# Patient Record
Sex: Female | Born: 1985 | Race: White | Hispanic: No | Marital: Married | Smoking: Current some day smoker
Health system: Southern US, Community
[De-identification: ages and names within clinical notes are randomized; demographics above are authoritative.]

## PROBLEM LIST (undated history)

## (undated) DIAGNOSIS — Z933 Colostomy status: Secondary | ICD-10-CM

## (undated) DIAGNOSIS — G43909 Migraine, unspecified, not intractable, without status migrainosus: Secondary | ICD-10-CM

## (undated) DIAGNOSIS — R221 Localized swelling, mass and lump, neck: Secondary | ICD-10-CM

## (undated) DIAGNOSIS — R011 Cardiac murmur, unspecified: Secondary | ICD-10-CM

## (undated) DIAGNOSIS — T7840XA Allergy, unspecified, initial encounter: Secondary | ICD-10-CM

## (undated) DIAGNOSIS — A6920 Lyme disease, unspecified: Secondary | ICD-10-CM

## (undated) DIAGNOSIS — F32A Depression, unspecified: Secondary | ICD-10-CM

## (undated) DIAGNOSIS — F419 Anxiety disorder, unspecified: Secondary | ICD-10-CM

## (undated) DIAGNOSIS — F329 Major depressive disorder, single episode, unspecified: Secondary | ICD-10-CM

## (undated) DIAGNOSIS — N179 Acute kidney failure, unspecified: Secondary | ICD-10-CM

## (undated) DIAGNOSIS — J189 Pneumonia, unspecified organism: Secondary | ICD-10-CM

## (undated) DIAGNOSIS — Z720 Tobacco use: Secondary | ICD-10-CM

## (undated) DIAGNOSIS — K567 Ileus, unspecified: Secondary | ICD-10-CM

## (undated) DIAGNOSIS — B019 Varicella without complication: Secondary | ICD-10-CM

## (undated) DIAGNOSIS — N83209 Unspecified ovarian cyst, unspecified side: Secondary | ICD-10-CM

## (undated) HISTORY — DX: Allergy, unspecified, initial encounter: T78.40XA

## (undated) HISTORY — DX: Colostomy status: Z93.3

## (undated) HISTORY — DX: Cardiac murmur, unspecified: R01.1

## (undated) HISTORY — DX: Depression, unspecified: F32.A

## (undated) HISTORY — DX: Lyme disease, unspecified: A69.20

## (undated) HISTORY — DX: Anxiety disorder, unspecified: F41.9

## (undated) HISTORY — DX: Migraine, unspecified, not intractable, without status migrainosus: G43.909

## (undated) HISTORY — DX: Varicella without complication: B01.9

## (undated) HISTORY — DX: Major depressive disorder, single episode, unspecified: F32.9

## (undated) HISTORY — PX: CYSTECTOMY: SUR359

---

## 2006-07-16 ENCOUNTER — Inpatient Hospital Stay: Payer: Self-pay

## 2006-07-25 ENCOUNTER — Ambulatory Visit: Payer: Self-pay | Admitting: Unknown Physician Specialty

## 2006-08-01 ENCOUNTER — Ambulatory Visit: Payer: Self-pay | Admitting: Unknown Physician Specialty

## 2006-08-26 HISTORY — PX: DILATION AND CURETTAGE OF UTERUS: SHX78

## 2007-12-04 ENCOUNTER — Other Ambulatory Visit: Payer: Self-pay

## 2007-12-04 ENCOUNTER — Inpatient Hospital Stay: Payer: Self-pay | Admitting: Psychiatry

## 2007-12-07 ENCOUNTER — Ambulatory Visit: Payer: Self-pay | Admitting: Cardiology

## 2009-04-29 ENCOUNTER — Emergency Department: Payer: Self-pay | Admitting: Unknown Physician Specialty

## 2014-06-15 ENCOUNTER — Ambulatory Visit: Payer: Self-pay

## 2016-03-06 ENCOUNTER — Encounter: Payer: Self-pay | Admitting: Family Medicine

## 2016-03-06 ENCOUNTER — Ambulatory Visit (INDEPENDENT_AMBULATORY_CARE_PROVIDER_SITE_OTHER): Payer: Self-pay

## 2016-03-06 ENCOUNTER — Ambulatory Visit (INDEPENDENT_AMBULATORY_CARE_PROVIDER_SITE_OTHER): Payer: Self-pay | Admitting: Family Medicine

## 2016-03-06 DIAGNOSIS — F32A Depression, unspecified: Secondary | ICD-10-CM

## 2016-03-06 DIAGNOSIS — M25561 Pain in right knee: Secondary | ICD-10-CM

## 2016-03-06 DIAGNOSIS — F329 Major depressive disorder, single episode, unspecified: Secondary | ICD-10-CM

## 2016-03-06 DIAGNOSIS — F17210 Nicotine dependence, cigarettes, uncomplicated: Secondary | ICD-10-CM

## 2016-03-06 MED ORDER — BUPROPION HCL ER (XL) 150 MG PO TB24: 150.0000 mg | ORAL_TABLET | Freq: Every day | ORAL | Status: DC

## 2016-03-06 MED ORDER — DICLOFENAC SODIUM 75 MG PO TBEC: 75.0000 mg | DELAYED_RELEASE_TABLET | Freq: Two times a day (BID) | ORAL | Status: DC | PRN

## 2016-03-06 NOTE — Patient Instructions (Signed)
Take the medications as prescribed.   Follow up in 6 weeks.  Take care  Dr. Lacinda Axon   Health Maintenance, Female Adopting a healthy lifestyle and getting preventive care can go a long way to promote health and wellness. Talk with your health care provider about what schedule of regular examinations is right for you. This is a good chance for you to check in with your provider about disease prevention and staying healthy. In between checkups, there are plenty of things you can do on your own. Experts have done a lot of research about which lifestyle changes and preventive measures are most likely to keep you healthy. Ask your health care provider for more information. WEIGHT AND DIET  Eat a healthy diet  Be sure to include plenty of vegetables, fruits, low-fat dairy products, and lean protein.  Do not eat a lot of foods high in solid fats, added sugars, or salt.  Get regular exercise. This is one of the most important things you can do for your health.  Most adults should exercise for at least 150 minutes each week. The exercise should increase your heart rate and make you sweat (moderate-intensity exercise).  Most adults should also do strengthening exercises at least twice a week. This is in addition to the moderate-intensity exercise.  Maintain a healthy weight  Body mass index (BMI) is a measurement that can be used to identify possible weight problems. It estimates body fat based on height and weight. Your health care provider can help determine your BMI and help you achieve or maintain a healthy weight.  For females 86 years of age and older:   A BMI below 18.5 is considered underweight.  A BMI of 18.5 to 24.9 is normal.  A BMI of 25 to 29.9 is considered overweight.  A BMI of 30 and above is considered obese.  Watch levels of cholesterol and blood lipids  You should start having your blood tested for lipids and cholesterol at 30 years of age, then have this test every 5  years.  You may need to have your cholesterol levels checked more often if:  Your lipid or cholesterol levels are high.  You are older than 30 years of age.  You are at high risk for heart disease.  CANCER SCREENING   Lung Cancer  Lung cancer screening is recommended for adults 50-56 years old who are at high risk for lung cancer because of a history of smoking.  A yearly low-dose CT scan of the lungs is recommended for people who:  Currently smoke.  Have quit within the past 15 years.  Have at least a 30-pack-year history of smoking. A pack year is smoking an average of one pack of cigarettes a day for 1 year.  Yearly screening should continue until it has been 15 years since you quit.  Yearly screening should stop if you develop a health problem that would prevent you from having lung cancer treatment.  Breast Cancer  Practice breast self-awareness. This means understanding how your breasts normally appear and feel.  It also means doing regular breast self-exams. Let your health care provider know about any changes, no matter how small.  If you are in your 20s or 30s, you should have a clinical breast exam (CBE) by a health care provider every 1-3 years as part of a regular health exam.  If you are 25 or older, have a CBE every year. Also consider having a breast X-ray (mammogram) every year.  If  you have a family history of breast cancer, talk to your health care provider about genetic screening.  If you are at high risk for breast cancer, talk to your health care provider about having an MRI and a mammogram every year.  Breast cancer gene (BRCA) assessment is recommended for women who have family members with BRCA-related cancers. BRCA-related cancers include:  Breast.  Ovarian.  Tubal.  Peritoneal cancers.  Results of the assessment will determine the need for genetic counseling and BRCA1 and BRCA2 testing. Cervical Cancer Your health care provider may  recommend that you be screened regularly for cancer of the pelvic organs (ovaries, uterus, and vagina). This screening involves a pelvic examination, including checking for microscopic changes to the surface of your cervix (Pap test). You may be encouraged to have this screening done every 3 years, beginning at age 47.  For women ages 80-65, health care providers may recommend pelvic exams and Pap testing every 3 years, or they may recommend the Pap and pelvic exam, combined with testing for human papilloma virus (HPV), every 5 years. Some types of HPV increase your risk of cervical cancer. Testing for HPV may also be done on women of any age with unclear Pap test results.  Other health care providers may not recommend any screening for nonpregnant women who are considered low risk for pelvic cancer and who do not have symptoms. Ask your health care provider if a screening pelvic exam is right for you.  If you have had past treatment for cervical cancer or a condition that could lead to cancer, you need Pap tests and screening for cancer for at least 20 years after your treatment. If Pap tests have been discontinued, your risk factors (such as having a new sexual partner) need to be reassessed to determine if screening should resume. Some women have medical problems that increase the chance of getting cervical cancer. In these cases, your health care provider may recommend more frequent screening and Pap tests. Colorectal Cancer  This type of cancer can be detected and often prevented.  Routine colorectal cancer screening usually begins at 30 years of age and continues through 30 years of age.  Your health care provider may recommend screening at an earlier age if you have risk factors for colon cancer.  Your health care provider may also recommend using home test kits to check for hidden blood in the stool.  A small camera at the end of a tube can be used to examine your colon directly  (sigmoidoscopy or colonoscopy). This is done to check for the earliest forms of colorectal cancer.  Routine screening usually begins at age 57.  Direct examination of the colon should be repeated every 5-10 years through 30 years of age. However, you may need to be screened more often if early forms of precancerous polyps or small growths are found. Skin Cancer  Check your skin from head to toe regularly.  Tell your health care provider about any new moles or changes in moles, especially if there is a change in a mole's shape or color.  Also tell your health care provider if you have a mole that is larger than the size of a pencil eraser.  Always use sunscreen. Apply sunscreen liberally and repeatedly throughout the day.  Protect yourself by wearing long sleeves, pants, a wide-brimmed hat, and sunglasses whenever you are outside. HEART DISEASE, DIABETES, AND HIGH BLOOD PRESSURE   High blood pressure causes heart disease and increases the risk of  stroke. High blood pressure is more likely to develop in:  People who have blood pressure in the high end of the normal range (130-139/85-89 mm Hg).  People who are overweight or obese.  People who are African American.  If you are 9-32 years of age, have your blood pressure checked every 3-5 years. If you are 6 years of age or older, have your blood pressure checked every year. You should have your blood pressure measured twice--once when you are at a hospital or clinic, and once when you are not at a hospital or clinic. Record the average of the two measurements. To check your blood pressure when you are not at a hospital or clinic, you can use:  An automated blood pressure machine at a pharmacy.  A home blood pressure monitor.  If you are between 44 years and 43 years old, ask your health care provider if you should take aspirin to prevent strokes.  Have regular diabetes screenings. This involves taking a blood sample to check your  fasting blood sugar level.  If you are at a normal weight and have a low risk for diabetes, have this test once every three years after 30 years of age.  If you are overweight and have a high risk for diabetes, consider being tested at a younger age or more often. PREVENTING INFECTION  Hepatitis B  If you have a higher risk for hepatitis B, you should be screened for this virus. You are considered at high risk for hepatitis B if:  You were born in a country where hepatitis B is common. Ask your health care provider which countries are considered high risk.  Your parents were born in a high-risk country, and you have not been immunized against hepatitis B (hepatitis B vaccine).  You have HIV or AIDS.  You use needles to inject street drugs.  You live with someone who has hepatitis B.  You have had sex with someone who has hepatitis B.  You get hemodialysis treatment.  You take certain medicines for conditions, including cancer, organ transplantation, and autoimmune conditions. Hepatitis C  Blood testing is recommended for:  Everyone born from [birth year] through 1965.  Anyone with known risk factors for hepatitis C. Sexually transmitted infections (STIs)  You should be screened for sexually transmitted infections (STIs) including gonorrhea and chlamydia if:  You are sexually active and are younger than 30 years of age.  You are older than 30 years of age and your health care provider tells you that you are at risk for this type of infection.  Your sexual activity has changed since you were last screened and you are at an increased risk for chlamydia or gonorrhea. Ask your health care provider if you are at risk.  If you do not have HIV, but are at risk, it may be recommended that you take a prescription medicine daily to prevent HIV infection. This is called pre-exposure prophylaxis (PrEP). You are considered at risk if:  You are sexually active and do not regularly use condoms or  know the HIV status of your partner(s).  You take drugs by injection.  You are sexually active with a partner who has HIV. Talk with your health care provider about whether you are at high risk of being infected with HIV. If you choose to begin PrEP, you should first be tested for HIV. You should then be tested every 3 months for as long as you are taking PrEP.  PREGNANCY   If  you are premenopausal and you may become pregnant, ask your health care provider about preconception counseling.  If you may become pregnant, take 400 to 800 micrograms (mcg) of folic acid every day.  If you want to prevent pregnancy, talk to your health care provider about birth control (contraception). OSTEOPOROSIS AND MENOPAUSE   Osteoporosis is a disease in which the bones lose minerals and strength with aging. This can result in serious bone fractures. Your risk for osteoporosis can be identified using a bone density scan.  If you are 83 years of age or older, or if you are at risk for osteoporosis and fractures, ask your health care provider if you should be screened.  Ask your health care provider whether you should take a calcium or vitamin D supplement to lower your risk for osteoporosis.  Menopause may have certain physical symptoms and risks.  Hormone replacement therapy may reduce some of these symptoms and risks. Talk to your health care provider about whether hormone replacement therapy is right for you.  HOME CARE INSTRUCTIONS   Schedule regular health, dental, and eye exams.  Stay current with your immunizations.   Do not use any tobacco products including cigarettes, chewing tobacco, or electronic cigarettes.  If you are pregnant, do not drink alcohol.  If you are breastfeeding, limit how much and how often you drink alcohol.  Limit alcohol intake to no more than 1 drink per day for nonpregnant women. One drink equals 12 ounces of beer, 5 ounces of wine, or 1 ounces of hard liquor.  Do  not use street drugs.  Do not share needles.  Ask your health care provider for help if you need support or information about quitting drugs.  Tell your health care provider if you often feel depressed.  Tell your health care provider if you have ever been abused or do not feel safe at home.   This information is not intended to replace advice given to you by your health care provider. Make sure you discuss any questions you have with your health care provider.   Document Released: 02/25/2011 Document Revised: 09/02/2014 Document Reviewed: 07/14/2013 Elsevier Interactive Patient Education [employer].

## 2016-03-06 NOTE — Progress Notes (Signed)
Pre visit review using our clinic review tool, if applicable. No additional management support is needed unless otherwise documented below in the visit note. 

## 2016-03-07 ENCOUNTER — Encounter: Payer: Self-pay | Admitting: Family Medicine

## 2016-03-07 DIAGNOSIS — F32A Depression, unspecified: Secondary | ICD-10-CM | POA: Insufficient documentation

## 2016-03-07 DIAGNOSIS — F17200 Nicotine dependence, unspecified, uncomplicated: Secondary | ICD-10-CM | POA: Insufficient documentation

## 2016-03-07 DIAGNOSIS — M25561 Pain in right knee: Secondary | ICD-10-CM | POA: Insufficient documentation

## 2016-03-07 DIAGNOSIS — F329 Major depressive disorder, single episode, unspecified: Secondary | ICD-10-CM | POA: Insufficient documentation

## 2016-03-07 NOTE — Assessment & Plan Note (Signed)
New problem. Exam unremarkable. Suspect MSK etiology. Treating with Voltaren. Obtaining x-ray.

## 2016-03-07 NOTE — Progress Notes (Signed)
Subjective:  Patient ID: Lacey Harrison, female    DOB: 1986/02/28  Age: 30 y.o. MRN: [id number]  CC: Smoking cessation, Depression/Anxiety, Knee pain  HPI Lacey Harrison is a 30 y.o. female presents to the clinic today as a new patient with the above complaints.  Smoking cessation  Patient currently smokes anywhere from a half a pack to pack a day.  She is very interested in quitting.  She has quit in the past briefly but then relapsed.  She is very motivated and would like to discuss treatment options today.  Depression/Anxiety  Patient reports a history of depression and anxiety.  She states that recently she has been experiencing signs and symptoms of depression and possibly anxiety.  She states that is been experiencing insomnia as well as periods of time where she sleeps too much.  She reports low energy.  She reports decreased interest in doing things/motivation.  She reports that she is unhappy.  Feels down and his lack of desire.  She states that she has had this issue in the past.  No recent new stressors per her report.  No known exacerbating factors.  Right knee pain  Patient reports that she has recently been expressing her right knee pain.  Pain located at the patella.  Moderate to severe at times.  Appears to be aggravated by certain motions and prolonged sitting or standing.  No recent fall, trauma, injury.  No known relieving factors. She's taken some over-the-counter medicine for this.  PMH, Surgical Hx, Family Hx, Social History reviewed and updated as below.  Past Medical History  Diagnosis Date  . Chicken pox   . Heart murmur   . Migraines   . Depression   . Lyme disease     patient reports a remote history of lyme and alludes that she has "chronic lyme"   Past Surgical History  Procedure Laterality Date  . Cystectomy    . Dilation and curettage of uterus  2008   Family History  Problem Relation Age of Onset  . Mental  illness Mother   . Mental illness Father   . Mental illness Maternal Grandmother   . Mental illness Paternal Grandmother    Social History  Substance Use Topics  . Smoking status: Current Every Day Smoker -- 0.50 packs/day    Types: Cigarettes  . Smokeless tobacco: Never Used  . Alcohol Use: 1.8 oz/week    3 Standard drinks or equivalent per week   Review of Systems  HENT: Positive for tinnitus.   Eyes: Positive for visual disturbance.  Musculoskeletal:       Knee pain, right,.  Psychiatric/Behavioral:       Anxiety, stress.  All other systems reviewed and are negative.  Objective:   Today's Vitals: BP 116/70 mmHg  Pulse 75  Temp(Src) 97.8 F (36.6 C) (Oral)  Ht 5' 2.9" (1.598 m)  Wt 191 lb 6 oz (86.807 kg)  BMI 33.99 kg/m2  SpO2 98%  Physical Exam  Constitutional: She is oriented to person, place, and time. She appears well-developed. No distress.  HENT:  Head: Normocephalic and atraumatic.  Mouth/Throat: Oropharynx is clear and moist.  Normal TM's bilaterally.   Eyes: Conjunctivae are normal. No scleral icterus.  Neck: Neck supple.  Cardiovascular: Normal rate and regular rhythm.   No murmur heard. Pulmonary/Chest: Effort normal. She has no wheezes. She has no rales.  Abdominal: Soft. She exhibits no distension. There is no tenderness.  Musculoskeletal:  Knee: Right Normal to  inspection with no erythema or effusion or obvious bony abnormalities.  Palpation normal with no warmth, joint line tenderness, patellar tenderness, or condyle tenderness. ROM full in flexion and extension and lower leg rotation. Ligaments with solid consistent endpoints including ACL, PCL, LCL, MCL. Negative Mcmurray's. Non painful patellar compression. Patellar glide without crepitus. Patellar and quadriceps tendons unremarkable.    Neurological: She is alert and oriented to person, place, and time.  Skin: Skin is warm and dry. No rash noted.  Psychiatric: She has a normal mood and  affect.  Vitals reviewed.  Assessment & Plan:   Problem List Items Addressed This Visit    Nicotine dependence    Tobacco cessation counseling  Ask: Currently smokes 1/2 ppd to 1 ppd.   Advise: Advised to quit and she is interested.  Assess: Willing to quit and requesting help.  Assist: Discussed treatment options - nicotine replacement, Chantix, Wellbutrin. Elected for Wellbutrin after discussion.  Arrange follow up: 6 weeks.  Time spent: 3 mins.        Depression    New problem. Patient exhibiting signs and symptoms of depression. Starting Wellbutrin today as this will hopefully help with her depression as well as smoking cessation.      Relevant Medications   buPROPion (WELLBUTRIN XL) 150 MG 24 hr tablet   Right knee pain    New problem. Exam unremarkable. Suspect MSK etiology. Treating with Voltaren. Obtaining x-ray.      Relevant Orders   DG Knee Complete 4 Views Right      Outpatient Encounter Prescriptions as of 03/06/2016  Medication Sig  . buPROPion (WELLBUTRIN XL) 150 MG 24 hr tablet Take 1 tablet (150 mg total) by mouth daily. Increase to 300 mg after 1 week.  . diclofenac (VOLTAREN) 75 MG EC tablet Take 1 tablet (75 mg total) by mouth 2 (two) times daily as needed.   No facility-administered encounter medications on file as of 03/06/2016.    Follow-up: 6 weeks  [place]

## 2016-03-07 NOTE — Assessment & Plan Note (Signed)
Tobacco cessation counseling  Ask: Currently smokes 1/2 ppd to 1 ppd.   Advise: Advised to quit and she is interested.  Assess: Willing to quit and requesting help.  Assist: Discussed treatment options - nicotine replacement, Chantix, Wellbutrin. Elected for Wellbutrin after discussion.  Arrange follow up: 6 weeks.  Time spent: 3 mins.

## 2016-03-07 NOTE — Assessment & Plan Note (Signed)
New problem. Patient exhibiting signs and symptoms of depression. Starting Wellbutrin today as this will hopefully help with her depression as well as smoking cessation.

## 2016-03-09 ENCOUNTER — Encounter: Payer: Self-pay | Admitting: Emergency Medicine

## 2016-03-09 ENCOUNTER — Emergency Department
Admission: EM | Admit: 2016-03-09 | Discharge: 2016-03-09 | Disposition: A | Discharge status: Home / Self Care | Payer: BLUE CROSS/BLUE SHIELD | Attending: Emergency Medicine | Admitting: Emergency Medicine

## 2016-03-09 DIAGNOSIS — F17210 Nicotine dependence, cigarettes, uncomplicated: Secondary | ICD-10-CM | POA: Insufficient documentation

## 2016-03-09 DIAGNOSIS — F329 Major depressive disorder, single episode, unspecified: Secondary | ICD-10-CM | POA: Diagnosis not present

## 2016-03-09 DIAGNOSIS — Y939 Activity, unspecified: Secondary | ICD-10-CM | POA: Insufficient documentation

## 2016-03-09 DIAGNOSIS — Y929 Unspecified place or not applicable: Secondary | ICD-10-CM | POA: Diagnosis not present

## 2016-03-09 DIAGNOSIS — Y999 Unspecified external cause status: Secondary | ICD-10-CM | POA: Insufficient documentation

## 2016-03-09 DIAGNOSIS — L089 Local infection of the skin and subcutaneous tissue, unspecified: Secondary | ICD-10-CM

## 2016-03-09 DIAGNOSIS — W5501XA Bitten by cat, initial encounter: Secondary | ICD-10-CM | POA: Diagnosis not present

## 2016-03-09 DIAGNOSIS — S61452A Open bite of left hand, initial encounter: Secondary | ICD-10-CM

## 2016-03-09 DIAGNOSIS — S61257A Open bite of left little finger without damage to nail, initial encounter: Secondary | ICD-10-CM | POA: Diagnosis not present

## 2016-03-09 MED ORDER — RABIES VACCINE, PCEC IM SUSR
1.0000 mL | Freq: Once | INTRAMUSCULAR | Status: AC
  Administered 2016-03-09: 1 mL via INTRAMUSCULAR
  Filled 2016-03-09: qty 1

## 2016-03-09 MED ORDER — TRAMADOL HCL 50 MG PO TABS: 50.0000 mg | ORAL_TABLET | Freq: Four times a day (QID) | ORAL | Status: DC | PRN

## 2016-03-09 MED ORDER — RABIES IMMUNE GLOBULIN 150 UNIT/ML IM INJ
20.0000 [IU]/kg | INJECTION | Freq: Once | INTRAMUSCULAR | Status: AC
  Administered 2016-03-09: 1650 [IU] via INTRAMUSCULAR
  Filled 2016-03-09: qty 11

## 2016-03-09 MED ORDER — AMOXICILLIN-POT CLAVULANATE 875-125 MG PO TABS: 1.0000 | ORAL_TABLET | Freq: Two times a day (BID) | ORAL | Status: DC

## 2016-03-09 NOTE — ED Provider Notes (Signed)
[facility] Emergency Department Provider Note   ____________________________________________  Time seen: Approximately 2:49 PM  I have reviewed the triage vital signs and the nursing notes.   HISTORY  Chief Complaint Animal Bite    HPI Lacey Harrison is a 30 y.o. female patient bitten by a stray cat on the fifth digit left hand last night. Patient also had multiple scratches from the animal. Patient state her tetanus shot is up-to-date. Patient denies loss sensation or loss of function of the affected digit from the incident. No palliative measures for this complaint.   Past Medical History  Diagnosis Date  . Chicken pox   . Heart murmur   . Migraines   . Depression   . Lyme disease     patient reports a remote history of lyme and alludes that she has "chronic lyme"    Patient Active Problem List   Diagnosis Date Noted  . Nicotine dependence 03/07/2016  . Depression 03/07/2016  . Right knee pain 03/07/2016    Past Surgical History  Procedure Laterality Date  . Cystectomy    . Dilation and curettage of uterus  2008    Current Outpatient Rx  Name  Route  Sig  Dispense  Refill  . amoxicillin-clavulanate (AUGMENTIN) 875-125 MG tablet   Oral   Take 1 tablet by mouth 2 (two) times daily.   20 tablet   0   . buPROPion (WELLBUTRIN XL) 150 MG 24 hr tablet   Oral   Take 1 tablet (150 mg total) by mouth daily. Increase to 300 mg after 1 week.   180 tablet   0   . diclofenac (VOLTAREN) 75 MG EC tablet   Oral   Take 1 tablet (75 mg total) by mouth 2 (two) times daily as needed.   60 tablet   0     Allergies Review of patient's allergies indicates no known allergies.  Family History  Problem Relation Age of Onset  . Mental illness Mother   . Mental illness Father   . Mental illness Maternal Grandmother   . Mental illness Paternal Grandmother     Social History Social History  Substance Use Topics  . Smoking status:  Current Every Day Smoker -- 0.50 packs/day    Types: Cigarettes  . Smokeless tobacco: Never Used  . Alcohol Use: 1.8 oz/week    3 Standard drinks or equivalent per week    Review of Systems Constitutional: No fever/chills Eyes: No visual changes. ENT: No sore throat. Cardiovascular: Denies chest pain. Respiratory: Denies shortness of breath. Gastrointestinal: No abdominal pain.  No nausea, no vomiting.  No diarrhea.  No constipation. Genitourinary: Negative for dysuria. Musculoskeletal: Negative for back pain. Skin: Negative for rash.Redness swelling to the fifth digit left hand. Neurological: Negative for headaches, focal weakness or numbness. Psychiatric: Depression ____________________________________________   PHYSICAL EXAM:  VITAL SIGNS: ED Triage Vitals  Enc Vitals Group     BP 03/09/16 1427 116/77 mmHg     Pulse Rate 03/09/16 1427 99     Resp 03/09/16 1427 18     Temp 03/09/16 1427 98.3 F (36.8 C)     Temp Source 03/09/16 1427 Oral     SpO2 03/09/16 1427 97 %     Weight 03/09/16 1427 180 lb (81.647 kg)     Height 03/09/16 1427 5\' 3"  (1.6 m)     Head Cir --      Peak Flow --      Pain Score  03/09/16 1429 1     Pain Loc --      Pain Edu? --      Excl. in [place]? --     Constitutional: Alert and oriented. Well appearing and in no acute distress. Eyes: Conjunctivae are normal. PERRL. EOMI. Head: Atraumatic. Nose: No congestion/rhinnorhea. Mouth/Throat: Mucous membranes are moist.  Oropharynx non-erythematous. Neck: No stridor.  No cervical spine tenderness to palpation. Hematological/Lymphatic/Immunilogical: No cervical lymphadenopathy. Cardiovascular: Normal rate, regular rhythm. Grossly normal heart sounds.  Good peripheral circulation. Respiratory: Normal respiratory effort.  No retractions. Lungs CTAB. Gastrointestinal: Soft and nontender. No distention. No abdominal bruits. No CVA tenderness. Musculoskeletal: No lower extremity tenderness nor edema.  No  joint effusions. Neurologic:  Normal speech and language. No gross focal neurologic deficits are appreciated. No gait instability. Skin:  Edema and erythema and puncture wound to the fifth digit left hand. Patient also multiple scratches to the palmar and dorsal aspect of the bilateral hands.  Psychiatric: Mood and affect are normal. Speech and behavior are normal.  ____________________________________________   LABS (all labs ordered are listed, but only abnormal results are displayed)  Labs Reviewed - No data to display ____________________________________________  EKG   ____________________________________________  RADIOLOGY   ____________________________________________   PROCEDURES  Procedure(s) performed: None  Procedures  Critical Care performed: No  ____________________________________________   INITIAL IMPRESSION / ASSESSMENT AND PLAN / ED COURSE  Pertinent labs & imaging results that were available during my care of the patient were reviewed by me and considered in my medical decision making (see chart for details).  Bite and scratches to the upper extremity. Cellulitis to the fifth digit left hand. Patient given discharge care instructions. Patient start rabies series in the ED today. Patient advised to follow up per scheduled for continual rabies shots to complete a series. Patient given a prescription for Augmentin. Patient declined pain medication at this time. ____________________________________________   FINAL CLINICAL IMPRESSION(S) / ED DIAGNOSES  Final diagnoses:  Cat bite of left hand including fingers with infection, initial encounter      NEW MEDICATIONS STARTED DURING THIS VISIT:  New Prescriptions   AMOXICILLIN-CLAVULANATE (AUGMENTIN) 875-125 MG TABLET    Take 1 tablet by mouth 2 (two) times daily.     Note:  This document was prepared using Dragon voice recognition software and may include unintentional dictation  errors.    Sable Feil, PA-C 03/09/16 1500  Harvest Dark, MD 03/09/16 [phone]

## 2016-03-09 NOTE — ED Notes (Signed)
Stray cat bite approx 9pm last evening to L hand.

## 2016-03-09 NOTE — Discharge Instructions (Signed)
Animal Bite °Animal bite wounds can get infected. It is important to get proper medical treatment. Ask your doctor if you need rabies treatment. °HOME CARE  °Wound Care °· Follow instructions from your doctor about how to take care of your wound. Make sure you: °¨ Wash your hands with soap and water before you change your bandage (dressing). If you cannot use soap and water, use hand sanitizer. °¨ Change your bandage as told by your doctor. °¨ Leave stitches (sutures), skin glue, or skin tape (adhesive) strips in place. They may need to stay in place for 2 weeks or longer. If tape strips get loose and curl up, you may trim the loose edges. Do not remove tape strips completely unless your doctor says it is okay. °· Check your wound every day for signs of infection. Watch for:   °¨   Redness, swelling, or pain that gets worse.   °¨   Fluid, blood, or pus.   °General Instructions °· Take or apply over-the-counter and prescription medicines only as told by your doctor.   °· If you were prescribed an antibiotic, take or apply it as told by your doctor. Do not stop using the antibiotic even if your condition improves.   °· Keep the injured area raised (elevated) above the level of your heart while you are sitting or lying down. °· If directed, apply ice to the injured area.   °¨   Put ice in a plastic bag.   °¨   Place a towel between your skin and the bag.   °¨   Leave the ice on for 20 minutes, 2-3 times per day.   °· Keep all follow-up visits as told by your doctor. This is important.   °GET HELP IF: °· You have redness, swelling, or pain that gets worse.   °· You have a general feeling of sickness (malaise).   °· You feel sick to your stomach (nauseous). °· You throw up (vomit).   °· You have pain that does not get better.   °GET HELP RIGHT AWAY IF:  °· You have a red streak going away from your wound.   °· You have fluid, blood, or pus coming from your wound.   °· You have a fever or chills.   °· You have trouble  moving your injured area.   °· You have numbness or tingling anywhere on your body.   °  °This information is not intended to replace advice given to you by your health care provider. Make sure you discuss any questions you have with your health care provider. °  °Document Released: 08/12/2005 Document Revised: 05/03/2015 Document Reviewed: 12/28/2014 °Elsevier Interactive Patient Education ©2016 [employer] ° °

## 2016-03-13 ENCOUNTER — Ambulatory Visit
Admission: EM | Admit: 2016-03-13 | Discharge: 2016-03-13 | Disposition: A | Discharge status: Home / Self Care | Payer: BLUE CROSS/BLUE SHIELD | Attending: Family Medicine | Admitting: Family Medicine

## 2016-03-13 ENCOUNTER — Encounter: Payer: Self-pay | Admitting: Emergency Medicine

## 2016-03-13 DIAGNOSIS — Z203 Contact with and (suspected) exposure to rabies: Secondary | ICD-10-CM | POA: Diagnosis not present

## 2016-03-13 MED ORDER — RABIES VACCINE, PCEC IM SUSR
1.0000 mL | Freq: Once | INTRAMUSCULAR | Status: AC
  Administered 2016-03-13: 1 mL via INTRAMUSCULAR

## 2016-03-13 NOTE — ED Notes (Signed)
Pt here for day 3 rabies vaccine, called yesterday but says no one called her back to come on 18th so here today. Dr. Zenda Alpers aware and instructed to give shot. Pt denies any adverse reaction to previous shots for rabies.

## 2016-03-16 ENCOUNTER — Encounter: Payer: Self-pay | Admitting: Emergency Medicine

## 2016-03-16 ENCOUNTER — Ambulatory Visit
Admission: EM | Admit: 2016-03-16 | Discharge: 2016-03-16 | Disposition: A | Discharge status: Home / Self Care | Payer: BLUE CROSS/BLUE SHIELD | Attending: Emergency Medicine | Admitting: Emergency Medicine

## 2016-03-16 DIAGNOSIS — Z203 Contact with and (suspected) exposure to rabies: Secondary | ICD-10-CM

## 2016-03-16 MED ORDER — RABIES VACCINE, PCEC IM SUSR
1.0000 mL | Freq: Once | INTRAMUSCULAR | Status: AC
  Administered 2016-03-16: 1 mL via INTRAMUSCULAR

## 2016-03-16 NOTE — ED Notes (Signed)
Patient here for Day 7 rabies vaccine.

## 2016-03-16 NOTE — ED Notes (Signed)
Patient shows no signs of adverse reaction to vaccine at this time.  Patient instructed to follow-up here next Saturday for Day 14 of her rabies vaccine.  Patient verbalized understanding.

## 2016-07-26 ENCOUNTER — Emergency Department: Payer: BLUE CROSS/BLUE SHIELD

## 2016-07-26 ENCOUNTER — Encounter: Payer: Self-pay | Admitting: Emergency Medicine

## 2016-07-26 ENCOUNTER — Other Ambulatory Visit: Payer: Self-pay

## 2016-07-26 ENCOUNTER — Emergency Department
Admission: EM | Admit: 2016-07-26 | Discharge: 2016-07-26 | Disposition: A | Discharge status: Home / Self Care | Payer: BLUE CROSS/BLUE SHIELD | Attending: Emergency Medicine | Admitting: Emergency Medicine

## 2016-07-26 ENCOUNTER — Telehealth: Payer: Self-pay | Admitting: Family Medicine

## 2016-07-26 DIAGNOSIS — Z79899 Other long term (current) drug therapy: Secondary | ICD-10-CM | POA: Diagnosis not present

## 2016-07-26 DIAGNOSIS — J189 Pneumonia, unspecified organism: Secondary | ICD-10-CM

## 2016-07-26 DIAGNOSIS — J181 Lobar pneumonia, unspecified organism: Secondary | ICD-10-CM | POA: Insufficient documentation

## 2016-07-26 DIAGNOSIS — R509 Fever, unspecified: Secondary | ICD-10-CM | POA: Diagnosis present

## 2016-07-26 DIAGNOSIS — Z87891 Personal history of nicotine dependence: Secondary | ICD-10-CM | POA: Insufficient documentation

## 2016-07-26 LAB — CBC
HCT: 43.6 % (ref 35.0–47.0)
Hemoglobin: 15.2 g/dL (ref 12.0–16.0)
MCH: 30.1 pg (ref 26.0–34.0)
MCHC: 35 g/dL (ref 32.0–36.0)
MCV: 86 fL (ref 80.0–100.0)
Platelets: 200 K/uL (ref 150–440)
RBC: 5.07 MIL/uL (ref 3.80–5.20)
RDW: 12.8 % (ref 11.5–14.5)
WBC: 9.9 K/uL (ref 3.6–11.0)

## 2016-07-26 LAB — URINALYSIS COMPLETE WITH MICROSCOPIC (ARMC ONLY)
Bacteria, UA: NONE SEEN
Glucose, UA: NEGATIVE mg/dL
Hgb urine dipstick: NEGATIVE
Leukocytes, UA: NEGATIVE
Nitrite: NEGATIVE
Protein, ur: 100 mg/dL — AB
Specific Gravity, Urine: 1.04 — ABNORMAL HIGH (ref 1.005–1.030)
pH: 5 (ref 5.0–8.0)

## 2016-07-26 LAB — HEPATIC FUNCTION PANEL
ALT: 45 U/L (ref 14–54)
AST: 36 U/L (ref 15–41)
Albumin: 3.9 g/dL (ref 3.5–5.0)
Alkaline Phosphatase: 50 U/L (ref 38–126)
Bilirubin, Direct: 0.4 mg/dL (ref 0.1–0.5)
Indirect Bilirubin: 1.1 mg/dL — ABNORMAL HIGH (ref 0.3–0.9)
Total Bilirubin: 1.5 mg/dL — ABNORMAL HIGH (ref 0.3–1.2)
Total Protein: 7.8 g/dL (ref 6.5–8.1)

## 2016-07-26 LAB — BASIC METABOLIC PANEL WITH GFR
Anion gap: 10 (ref 5–15)
BUN: 10 mg/dL (ref 6–20)
CO2: 22 mmol/L (ref 22–32)
Calcium: 8.9 mg/dL (ref 8.9–10.3)
Chloride: 105 mmol/L (ref 101–111)
Creatinine, Ser: 0.73 mg/dL (ref 0.44–1.00)
GFR calc Af Amer: >60 mL/min
GFR calc non Af Amer: >60 mL/min
Glucose, Bld: 157 mg/dL — ABNORMAL HIGH (ref 65–99)
Potassium: 3.5 mmol/L (ref 3.5–5.1)
Sodium: 137 mmol/L (ref 135–145)

## 2016-07-26 LAB — POCT PREGNANCY, URINE: PREG TEST UR: NEGATIVE

## 2016-07-26 LAB — LIPASE, BLOOD: Lipase: 18 U/L (ref 11–51)

## 2016-07-26 MED ORDER — LEVOFLOXACIN IN D5W 750 MG/150ML IV SOLN
750.0000 mg | Freq: Once | INTRAVENOUS | Status: AC
  Administered 2016-07-26: 750 mg via INTRAVENOUS

## 2016-07-26 MED ORDER — LEVOFLOXACIN IN D5W 750 MG/150ML IV SOLN
INTRAVENOUS | Status: AC
  Administered 2016-07-26: 750 mg via INTRAVENOUS
  Filled 2016-07-26: qty 150

## 2016-07-26 MED ORDER — ALBUTEROL SULFATE HFA 108 (90 BASE) MCG/ACT IN AERS: 2.0000 | INHALATION_SPRAY | Freq: Four times a day (QID) | RESPIRATORY_TRACT | 0 refills | Status: DC | PRN

## 2016-07-26 MED ORDER — SODIUM CHLORIDE 0.9 % IV BOLUS (SEPSIS)
1000.0000 mL | Freq: Once | INTRAVENOUS | Status: AC
  Administered 2016-07-26: 1000 mL via INTRAVENOUS

## 2016-07-26 MED ORDER — IPRATROPIUM-ALBUTEROL 0.5-2.5 (3) MG/3ML IN SOLN
3.0000 mL | Freq: Once | RESPIRATORY_TRACT | Status: AC
  Administered 2016-07-26: 3 mL via RESPIRATORY_TRACT
  Filled 2016-07-26: qty 3

## 2016-07-26 MED ORDER — LEVOFLOXACIN 750 MG PO TABS: 750.0000 mg | ORAL_TABLET | Freq: Every day | ORAL | 0 refills | Status: DC

## 2016-07-26 NOTE — ED Notes (Signed)
Discharge instructions reviewed with patient. Questions fielded by this RN. Patient verbalizes understanding of instructions. Patient discharged home in stable condition per Goodman MD . No acute distress noted at time of discharge.   

## 2016-07-26 NOTE — ED Notes (Signed)
Patient states that Tuesday she started having body aches, felt fatigued, and she had a rash. Patient states that she has also been having chills. Patient states that her temperature has been as high as 103.2.   Patient was seen by [place] on Wednesday and has been on Augmentin since then. Patient states that she has not noticed any improvement in her symptoms.   Patient states that this morning her fever was 103.2.

## 2016-07-26 NOTE — Telephone Encounter (Signed)
PLEASE NOTE: All timestamps contained within this report are represented as [place] Standard Time. CONFIDENTIALTY NOTICE: This fax transmission is intended only for the addressee. It contains information that is legally privileged, confidential or otherwise protected from use or disclosure. If you are not the intended recipient, you are strictly prohibited from reviewing, disclosing, copying using or disseminating any of this information or taking any action in reliance on or regarding this information. If you have received this fax in error, please notify us immediately by telephone so that we can arrange for its return to [place]. Phone: [phone], Toll-Free: [phone], Fax: [phone] Page: 1 of 1 Call Id: [id number] [name] Patient Name: Lacey Harrison DOB: Aug 11, 1986 Initial Comment caller states she has muscles pain/spasms, fever spikes to 103, was just in [place] Nurse Assessment Nurse: Dimas Chyle, RN, Dellis Filbert Date/Time Eilene Ghazi Time): 07/26/2016 1:37:19 PM Confirm and document reason for call. If symptomatic, describe symptoms. ---Caller states she has muscles pain/spasms, fever spikes to 103, was just in [place]. Symptoms started on Tuesday night. Does the patient have any new or worsening symptoms? ---Yes Will a triage be completed? ---Yes Related visit to physician within the last 2 weeks? ---N/A Does the PT have any chronic conditions? (i.e. diabetes, asthma, etc.) ---No Is the patient pregnant or possibly pregnant? (Ask all females between the ages of 42-55) ---No Is this a behavioral health or substance abuse call? ---No Guidelines Guideline Title Affirmed Question Affirmed Notes Cough - Acute Non-Productive Difficulty breathing Final Disposition User Go to ED Now Dimas Chyle, RN, [facility] - ED Disagree/Comply: Comply

## 2016-07-26 NOTE — Discharge Instructions (Signed)
Please seek medical attention for any high fevers, chest pain, shortness of breath, change in behavior, persistent vomiting, bloody stool or any other new or concerning symptoms.  

## 2016-07-26 NOTE — ED Triage Notes (Addendum)
Patient presents to the ED with fever since Tuesday.  Patient reports headache, dizziness and nausea with body aches and cough.  Patient appears uncomfortable in triage.  Patient reports taking tylenol and ibuprofen for fever with some relief but not complete relief.  Patient returned to the [place] from [place] about 1.5 weeks ago.  Patient has been taking augmentin x 2 days after going to [facility] walk-in.

## 2016-07-26 NOTE — Telephone Encounter (Signed)
Noted, thanks!

## 2016-07-26 NOTE — ED Provider Notes (Signed)
[facility] Emergency Department Provider Note  ____________________________________________   I have reviewed the triage vital signs and the nursing notes.   HISTORY  Chief Complaint Fever   History limited by: Not Limited   HPI Lacey Harrison is a 30 y.o. female who presents to the emergency department today with concerns for continued fever.The patient states fevers started 3 days ago. They do respond to tylenol/ibuprofen but return. The patient has also had body aches, headaches and cough. The patient was seen in urgent care and diagnosed with a UTI, however was placed on augmentin. The patient was recently in [place] however states they were told they were going to be at an elevation which did not require prophylaxis. She has not had any sick contacts that she is aware of. Slight amount of nausea and some vomiting.   Past Medical History:  Diagnosis Date  . Chicken pox   . Depression   . Heart murmur   . Lyme disease    patient reports a remote history of lyme and alludes that she has "chronic lyme"  . Migraines     Patient Active Problem List   Diagnosis Date Noted  . Nicotine dependence 03/07/2016  . Depression 03/07/2016  . Right knee pain 03/07/2016    Past Surgical History:  Procedure Laterality Date  . CYSTECTOMY    . DILATION AND CURETTAGE OF UTERUS  2008    Prior to Admission medications   Medication Sig Start Date End Date Taking? Authorizing Provider  amoxicillin-clavulanate (AUGMENTIN) 875-125 MG tablet Take 1 tablet by mouth 2 (two) times daily. 03/09/16   Sable Feil, PA-C  buPROPion (WELLBUTRIN XL) 150 MG 24 hr tablet Take 1 tablet (150 mg total) by mouth daily. Increase to 300 mg after 1 week. 03/06/16   Coral Spikes, DO  diclofenac (VOLTAREN) 75 MG EC tablet Take 1 tablet (75 mg total) by mouth 2 (two) times daily as needed. 03/06/16   Coral Spikes, DO  traMADol (ULTRAM) 50 MG tablet Take 1 tablet (50 mg total) by mouth every  6 (six) hours as needed for moderate pain. 03/09/16   Sable Feil, PA-C    Allergies Patient has no known allergies.  Family History  Problem Relation Age of Onset  . Mental illness Mother   . Mental illness Father   . Mental illness Maternal Grandmother   . Mental illness Paternal Grandmother     Social History Social History  Substance Use Topics  . Smoking status: Former Smoker    Packs/day: 0.50    Types: Cigarettes    Quit date: 07/05/2016  . Smokeless tobacco: Never Used  . Alcohol use 1.8 oz/week    3 Standard drinks or equivalent per week    Review of Systems  Constitutional: Positive for fever. Cardiovascular: Negative for chest pain. Respiratory: Negative for shortness of breath. Gastrointestinal: Negative for abdominal pain, vomiting and diarrhea. Genitourinary: Negative for dysuria. Musculoskeletal: Positive for body aches Skin: Negative for rash.  10-point ROS otherwise negative.  ____________________________________________   PHYSICAL EXAM:  VITAL SIGNS: ED Triage Vitals  Enc Vitals Group     BP 07/26/16 1449 136/73     Pulse Rate 07/26/16 1449 (!) 109     Resp 07/26/16 1449 18     Temp 07/26/16 1449 99.3 F (37.4 C)     Temp Source 07/26/16 1449 Oral     SpO2 07/26/16 1449 94 %     Weight 07/26/16 1450 189 lb (  85.7 kg)     Height 07/26/16 1450 5\' 3"  (1.6 m)     Head Circumference --      Peak Flow --      Pain Score 07/26/16 1450 6   Constitutional: Alert and oriented. Well appearing and in no distress. Eyes: Conjunctivae are normal. Normal extraocular movements. ENT   Head: Normocephalic and atraumatic.   Nose: No congestion/rhinnorhea.   Mouth/Throat: Mucous membranes are moist.   Neck: No stridor. Hematological/Lymphatic/Immunilogical: No cervical lymphadenopathy. Cardiovascular: Normal rate, regular rhythm.  No murmurs, rubs, or gallops. Respiratory: Normal respiratory effort without tachypnea nor retractions. Breath  sounds are clear and equal bilaterally. No wheezes/rales/rhonchi. Gastrointestinal: Soft and nontender. No distention.  Genitourinary: Deferred Musculoskeletal: Normal range of motion in all extremities. No lower extremity edema. Neurologic:  Normal speech and language. No gross focal neurologic deficits are appreciated.  Skin:  Skin is warm, dry and intact. No rash noted. Psychiatric: Mood and affect are normal. Speech and behavior are normal. Patient exhibits appropriate insight and judgment.  ____________________________________________    LABS (pertinent positives/negatives)  Labs Reviewed  BASIC METABOLIC PANEL - Abnormal; Notable for the following:       Result Value   Glucose, Bld 157 (*)    All other components within normal limits  URINALYSIS COMPLETEWITH MICROSCOPIC ([facility] ONLY) - Abnormal; Notable for the following:    Color, Urine AMBER (*)    APPearance CLEAR (*)    Bilirubin Urine 1+ (*)    Ketones, ur TRACE (*)    Specific Gravity, Urine 1.040 (*)    Protein, ur 100 (*)    Squamous Epithelial / LPF 6-30 (*)    All other components within normal limits  HEPATIC FUNCTION PANEL - Abnormal; Notable for the following:    Total Bilirubin 1.5 (*)    Indirect Bilirubin 1.1 (*)    All other components within normal limits  CBC  LIPASE, BLOOD  POCT PREGNANCY, URINE     ____________________________________________   EKG  I, Nance Pear, attending physician, personally viewed and interpreted this EKG  EKG Time: 1500 Rate: 108 Rhythm: sinus tachycardia Axis: normal Intervals: qtc 423 QRS: narrow ST changes: no st elevation Impression: sinus tachycardia otherwise normal ekg   ____________________________________________    RADIOLOGY  None  ____________________________________________   PROCEDURES  Procedures  ____________________________________________   INITIAL IMPRESSION / ASSESSMENT AND PLAN / ED COURSE  Pertinent labs & imaging  results that were available during my care of the patient were reviewed by me and considered in my medical decision making (see chart for details).  Patient presented to the emergency department today because of concerns for continued fever in the setting of viral type symptoms. Patient was seen in urgent care and started on Augmentin. On exam here no concerning auscultation findings. Patient's blood work without any elevation of the white blood cell count. Patient had malaria test performed at urgent care and those results are still pending. Patient did feel better after some fluids. Rest of the blood work without any concerning findings. Urine without any signs of infection. This point think viral illness likely. Will discharge. Will give patient for infectious disease follow-up given recent travel. ____________________________________________   FINAL CLINICAL IMPRESSION(S) / ED DIAGNOSES  Final diagnoses:  Febrile illness     Note: This dictation was prepared with Dragon dictation. Any transcriptional errors that result from this process are unintentional    Nance Pear, MD 07/26/16 1950

## 2016-07-26 NOTE — Telephone Encounter (Signed)
Please advise, thanks.

## 2016-07-26 NOTE — Telephone Encounter (Signed)
Per other note, going to ED per [facility].

## 2016-07-26 NOTE — Telephone Encounter (Signed)
Pt called and stated that she went to Urgent care on Wednesday with a high fever, chills, and other flu like symptoms. Was given medication pt not feeling any better feels worse. She was just in [place] and Urgent care doctors did send off a test for Malaria. Sent pt's call to [facility] Triage.  Call pt @ [phone]

## 2016-07-26 NOTE — Telephone Encounter (Signed)
fyi

## 2016-07-31 ENCOUNTER — Ambulatory Visit: Payer: BLUE CROSS/BLUE SHIELD | Admitting: Family Medicine

## 2016-08-02 ENCOUNTER — Ambulatory Visit (INDEPENDENT_AMBULATORY_CARE_PROVIDER_SITE_OTHER): Payer: BLUE CROSS/BLUE SHIELD | Admitting: Family Medicine

## 2016-08-02 ENCOUNTER — Encounter: Payer: Self-pay | Admitting: Family Medicine

## 2016-08-02 DIAGNOSIS — J181 Lobar pneumonia, unspecified organism: Secondary | ICD-10-CM

## 2016-08-02 DIAGNOSIS — J189 Pneumonia, unspecified organism: Secondary | ICD-10-CM | POA: Insufficient documentation

## 2016-08-02 NOTE — Assessment & Plan Note (Signed)
New problem. Doing well at this time. Advised to finish Levaquin course. Follow-up as needed.

## 2016-08-02 NOTE — Progress Notes (Signed)
Pre visit review using our clinic review tool, if applicable. No additional management support is needed unless otherwise documented below in the visit note. 

## 2016-08-02 NOTE — Patient Instructions (Signed)
Finish the antibiotic.  If you have recurrence of the knee swelling let me know.  Take care  Dr. Lacinda Axon

## 2016-08-02 NOTE — Progress Notes (Signed)
   Subjective:  Patient ID: Lacey Harrison, female    DOB: 1986/08/08  Age: 30 y.o. MRN: [id number]  CC: Follow up  HPI:  30 year old female presents for ED follow-up.  Patient has recently been ill. She traveled to [place] and subsequently became sick shortly after. Symptoms started with sinus drainage, congestion, cough. Associated fever and chills. She was evaluated at the walk-in clinic and was diagnosed with upper respiratory infection. She was placed on Augmentin.  Symptoms continued and worsened prompting her to go to the ED on 12/1. X-ray was obtained and revealed left mid/lower lobe pneumonia. She was discharged home on Levaquin.  She states that she is now doing well. She still has a little bit of a cough but is overall feeling much better. No further fever or chills. She does report some chest tightness. No other complaints or concerns at this time.  Social Hx   Social History   Social History  . Marital status: Married    Spouse name: N/A  . Number of children: N/A  . Years of education: N/A   Social History Main Topics  . Smoking status: Former Smoker    Packs/day: 0.50    Types: Cigarettes    Quit date: 07/05/2016  . Smokeless tobacco: Never Used  . Alcohol use 1.8 oz/week    3 Standard drinks or equivalent per week  . Drug use: No  . Sexual activity: Not Asked   Other Topics Concern  . None   Social History Narrative  . None    Review of Systems  Constitutional: Negative for fever.  Respiratory: Positive for cough and chest tightness.    Objective:  BP (!) 89/59 (BP Location: Left Arm, Patient Position: Sitting, Cuff Size: Normal)   Pulse 86   Temp 98.2 F (36.8 C) (Oral)   Resp 12   Wt 187 lb 4 oz (84.9 kg)   SpO2 96%   BMI 33.17 kg/m   BP/Weight 08/02/2016 07/26/2016 [id number]  Systolic BP 89 [id number] [id number]  Diastolic BP 59 72 75  Wt. (Lbs) 187.25 189 180  BMI 33.17 33.48 31.89   Physical Exam  Constitutional: She is oriented to person, place, and  time. She appears well-developed. No distress.  Cardiovascular: Normal rate and regular rhythm.   Pulmonary/Chest: Effort normal.  Left basilar faint wheezing.  Neurological: She is alert and oriented to person, place, and time.  Psychiatric: She has a normal mood and affect.  Vitals reviewed.  Lab Results  Component Value Date   WBC 9.9 07/26/2016   HGB 15.2 07/26/2016   HCT 43.6 07/26/2016   PLT 200 07/26/2016   GLUCOSE 157 (H) 07/26/2016   ALT 45 07/26/2016   AST 36 07/26/2016   NA 137 07/26/2016   K 3.5 07/26/2016   CL 105 07/26/2016   CREATININE 0.73 07/26/2016   BUN 10 07/26/2016   CO2 22 07/26/2016   Assessment & Plan:   Problem List Items Addressed This Visit    CAP (community acquired pneumonia)    New problem. Doing well at this time. Advised to finish Levaquin course. Follow-up as needed.        Follow-up: PRN  [place]

## 2016-11-12 ENCOUNTER — Encounter: Payer: Self-pay | Admitting: Emergency Medicine

## 2016-11-12 ENCOUNTER — Ambulatory Visit
Admission: EM | Admit: 2016-11-12 | Discharge: 2016-11-12 | Disposition: A | Discharge status: Home / Self Care | Payer: BLUE CROSS/BLUE SHIELD | Attending: Emergency Medicine | Admitting: Emergency Medicine

## 2016-11-12 DIAGNOSIS — B370 Candidal stomatitis: Secondary | ICD-10-CM

## 2016-11-12 MED ORDER — CLOTRIMAZOLE 10 MG MT TROC: 10.0000 mg | Freq: Every day | OROMUCOSAL | 1 refills | Status: DC

## 2016-11-12 NOTE — ED Provider Notes (Signed)
CSN: [id number]     Arrival date & time 11/12/16  1451 History   First MD Initiated Contact with Patient 11/12/16 1536     Chief Complaint  Patient presents with  . Thrush   (Consider location/radiation/quality/duration/timing/severity/associated sxs/prior Treatment) HPI  This a 31 year old female presents with possible oral thrush. She had taken several courses of antibiotic in December for community-acquired pneumonia. Since that time he has not taken any other additional antibiotics. She is not immunocompromised. She has a bad taste in her mouth. Food does not taste right. She has a noticeable coating on her tongue that is white but does not involve the buccal surfaces of her cheeks or pharynx.       Past Medical History:  Diagnosis Date  . Chicken pox   . Depression   . Heart murmur   . Lyme disease    patient reports a remote history of lyme and alludes that she has "chronic lyme"  . Migraines    Past Surgical History:  Procedure Laterality Date  . CYSTECTOMY    . DILATION AND CURETTAGE OF UTERUS  2008   Family History  Problem Relation Age of Onset  . Mental illness Mother   . Mental illness Father   . Mental illness Maternal Grandmother   . Mental illness Paternal Grandmother    Social History  Substance Use Topics  . Smoking status: Former Smoker    Packs/day: 0.50    Types: Cigarettes    Quit date: 07/05/2016  . Smokeless tobacco: Never Used  . Alcohol use 1.8 oz/week    3 Standard drinks or equivalent per week   OB History    No data available     Review of Systems  Constitutional: Negative for activity change, appetite change, chills, diaphoresis and fatigue.  HENT: Positive for mouth sores.   All other systems reviewed and are negative.   Allergies  Patient has no known allergies.  Home Medications   Prior to Admission medications   Medication Sig Start Date End Date Taking? Authorizing Provider  clotrimazole (MYCELEX) 10 MG troche Take 1  tablet (10 mg total) by mouth 5 (five) times daily. Let disolve slowly. Do not crush or chew 11/12/16   Lorin Picket, PA-C   Meds Ordered and Administered this Visit  Medications - No data to display  BP 111/80 (BP Location: Left Arm)   Pulse 99   Temp 98.3 F (36.8 C) (Oral)   Resp 16   Ht 5\' 3"  (1.6 m)   Wt 180 lb (81.6 kg)   LMP 09/17/2016 (Approximate)   SpO2 99%   BMI 31.89 kg/m  No data found.   Physical Exam  Constitutional: She is oriented to person, place, and time. She appears well-developed and well-nourished. No distress.  HENT:  Head: Normocephalic and atraumatic.  Examination of the oropharynx is a white confluent coating over the tongue. Scraping with a tongue depressor does not cause bleeding but does cause some disruption in the coating. There is no on the buccal surfaces or on the palate. The pharynx is also spared.  Eyes: EOM are normal. Pupils are equal, round, and reactive to light. Right eye exhibits no discharge. Left eye exhibits no discharge.  Neck: Normal range of motion.  Musculoskeletal: Normal range of motion.  Lymphadenopathy:    She has no cervical adenopathy.  Neurological: She is alert and oriented to person, place, and time.  Skin: Skin is warm and dry. She is not diaphoretic.  Refer  to HEENT  Psychiatric: She has a normal mood and affect. Her behavior is normal. Judgment and thought content normal.  Nursing note and vitals reviewed.   Urgent Care Course     Procedures (including critical care time)  Labs Review Labs Reviewed - No data to display  Imaging Review No results found.   Visual Acuity Review  Right Eye Distance:   Left Eye Distance:   Bilateral Distance:    Right Eye Near:   Left Eye Near:    Bilateral Near:         MDM   1. Thrush, oral    Discharge Medication List as of 11/12/2016  3:57 PM    START taking these medications   Details  clotrimazole (MYCELEX) 10 MG troche Take 1 tablet (10 mg total) by  mouth 5 (five) times daily. Let disolve slowly. Do not crush or chew, Starting Tue 11/12/2016, Normal      Plan: 1. Test/x-ray results and diagnosis reviewed with patient 2. rx as per orders; risks, benefits, potential side effects reviewed with patient 3. Recommend supportive treatment with Using medication as prescribed. May take 14 days for cure. If you are not seeing improvement recommend following up with a dermatologist. 4. F/u prn if symptoms worsen or don't improve     Lorin Picket, PA-C 11/12/16 Bristol Roemer, PA-C 11/12/16 1622

## 2016-11-12 NOTE — ED Triage Notes (Signed)
Patient states that she has thrush in her mouth for the past 2-3 weeks.

## 2017-07-26 DIAGNOSIS — J189 Pneumonia, unspecified organism: Secondary | ICD-10-CM

## 2017-07-26 HISTORY — DX: Pneumonia, unspecified organism: J18.9

## 2017-12-24 ENCOUNTER — Emergency Department
Admission: EM | Admit: 2017-12-24 | Discharge: 2017-12-24 | Disposition: A | Discharge status: Home / Self Care | Payer: Self-pay | Attending: Emergency Medicine | Admitting: Emergency Medicine

## 2017-12-24 ENCOUNTER — Emergency Department: Payer: Self-pay

## 2017-12-24 ENCOUNTER — Other Ambulatory Visit: Payer: Self-pay

## 2017-12-24 ENCOUNTER — Encounter: Payer: Self-pay | Admitting: *Deleted

## 2017-12-24 DIAGNOSIS — R1909 Other intra-abdominal and pelvic swelling, mass and lump: Secondary | ICD-10-CM | POA: Insufficient documentation

## 2017-12-24 DIAGNOSIS — R198 Other specified symptoms and signs involving the digestive system and abdomen: Secondary | ICD-10-CM

## 2017-12-24 DIAGNOSIS — R6889 Other general symptoms and signs: Secondary | ICD-10-CM

## 2017-12-24 DIAGNOSIS — R102 Pelvic and perineal pain: Secondary | ICD-10-CM

## 2017-12-24 DIAGNOSIS — F17210 Nicotine dependence, cigarettes, uncomplicated: Secondary | ICD-10-CM | POA: Insufficient documentation

## 2017-12-24 DIAGNOSIS — Z79899 Other long term (current) drug therapy: Secondary | ICD-10-CM | POA: Insufficient documentation

## 2017-12-24 LAB — COMPREHENSIVE METABOLIC PANEL
ALT: 29 U/L (ref 14–54)
AST: 26 U/L (ref 15–41)
Albumin: 4.2 g/dL (ref 3.5–5.0)
Alkaline Phosphatase: 50 U/L (ref 38–126)
Anion gap: 9 (ref 5–15)
BUN: 17 mg/dL (ref 6–20)
CHLORIDE: 103 mmol/L (ref 101–111)
CO2: 26 mmol/L (ref 22–32)
Calcium: 9.2 mg/dL (ref 8.9–10.3)
Creatinine, Ser: 0.72 mg/dL (ref 0.44–1.00)
GFR calc non Af Amer: >60 mL/min (ref >60)
Glucose, Bld: 115 mg/dL — ABNORMAL HIGH (ref 65–99)
Potassium: 3.9 mmol/L (ref 3.5–5.1)
SODIUM: 138 mmol/L (ref 135–145)
Total Bilirubin: 0.7 mg/dL (ref 0.3–1.2)
Total Protein: 7.3 g/dL (ref 6.5–8.1)

## 2017-12-24 LAB — URINALYSIS, COMPLETE (UACMP) WITH MICROSCOPIC
BACTERIA UA: NONE SEEN
BILIRUBIN URINE: NEGATIVE
Glucose, UA: NEGATIVE mg/dL
Hgb urine dipstick: NEGATIVE
KETONES UR: 5 mg/dL — AB
Leukocytes, UA: NEGATIVE
Nitrite: NEGATIVE
PROTEIN: NEGATIVE mg/dL
Specific Gravity, Urine: 1.027 (ref 1.005–1.030)
pH: 5 (ref 5.0–8.0)

## 2017-12-24 LAB — POC URINE PREG, ED: PREG TEST UR: NEGATIVE

## 2017-12-24 LAB — CBC
HEMATOCRIT: 44.8 % (ref 35.0–47.0)
Hemoglobin: 15.8 g/dL (ref 12.0–16.0)
MCH: 31 pg (ref 26.0–34.0)
MCHC: 35.3 g/dL (ref 32.0–36.0)
MCV: 87.9 fL (ref 80.0–100.0)
Platelets: 361 10*3/uL (ref 150–440)
RBC: 5.1 MIL/uL (ref 3.80–5.20)
RDW: 12.8 % (ref 11.5–14.5)
WBC: 12.8 10*3/uL — ABNORMAL HIGH (ref 3.6–11.0)

## 2017-12-24 LAB — WET PREP, GENITAL
CLUE CELLS WET PREP: NONE SEEN
Sperm: NONE SEEN
Trich, Wet Prep: NONE SEEN
WBC WET PREP: NONE SEEN
Yeast Wet Prep HPF POC: NONE SEEN

## 2017-12-24 LAB — LIPASE, BLOOD: LIPASE: 29 U/L (ref 11–51)

## 2017-12-24 MED ORDER — IBUPROFEN 200 MG PO TABS: 600.0000 mg | ORAL_TABLET | Freq: Four times a day (QID) | ORAL | 0 refills | Status: DC | PRN

## 2017-12-24 MED ORDER — OXYCODONE HCL 5 MG PO TABS: 5.0000 mg | ORAL_TABLET | Freq: Four times a day (QID) | ORAL | 0 refills | Status: DC | PRN

## 2017-12-24 MED ORDER — ACETAMINOPHEN 325 MG PO TABS: 650.0000 mg | ORAL_TABLET | Freq: Four times a day (QID) | ORAL | 0 refills | Status: DC | PRN

## 2017-12-24 NOTE — Discharge Instructions (Signed)
Your ultrasound shows a fluid collection in the left pelvis that is causing your pain. Call gynecology clinic tomorrow for a follow up appointment this week. Take ibuprofen and tylenol every 6 hours to control you pain, and take oxycodone only when needed for additional severe pain. Return to the ED if you have uncontrollable pain or fever.

## 2017-12-24 NOTE — ED Provider Notes (Addendum)
[facility] Emergency Department Provider Note  ____________________________________________  Time seen: Approximately 10:10 PM  I have reviewed the triage vital signs and the nursing notes.   HISTORY  Chief Complaint Pelvic Pain    HPI Lacey Harrison is a 32 y.o. female with a history of migraines and heart murmur who complains of pelvic pain that started this morning. Rapid onset, constant but waxing and waning throughout the day, sharp and severe. Nonradiating. No aggravating or alleviating factors. She does have a history of left ovarian cysts status post cystectomy.  Denies any abnormal vaginal bleeding or discharge. She is in a monogamous relationship for the past 9 years. she denies the possibility of STI.     Past Medical History:  Diagnosis Date  . Chicken pox   . Depression   . Heart murmur   . Lyme disease    patient reports a remote history of lyme and alludes that she has "chronic lyme"  . Migraines      Patient Active Problem List   Diagnosis Date Noted  . CAP (community acquired pneumonia) 08/02/2016  . Nicotine dependence 03/07/2016  . Depression 03/07/2016  . Right knee pain 03/07/2016     Past Surgical History:  Procedure Laterality Date  . CYSTECTOMY    . DILATION AND CURETTAGE OF UTERUS  2008     Prior to Admission medications   Medication Sig Start Date End Date Taking? Authorizing Provider  clotrimazole (MYCELEX) 10 MG troche Take 1 tablet (10 mg total) by mouth 5 (five) times daily. Let disolve slowly. Do not crush or chew 11/12/16   Lorin Picket, PA-C     Allergies Patient has no known allergies.   Family History  Problem Relation Age of Onset  . Mental illness Mother   . Mental illness Father   . Mental illness Maternal Grandmother   . Mental illness Paternal Grandmother     Social History Social History   Tobacco Use  . Smoking status: Current Every Day Smoker    Packs/day: 1.00    Types:  Cigarettes    Last attempt to quit: 07/05/2016    Years since quitting: 1.4  . Smokeless tobacco: Never Used  Substance Use Topics  . Alcohol use: Yes    Alcohol/week: 1.8 oz    Types: 3 Standard drinks or equivalent per week  . Drug use: No    Review of Systems  Constitutional:   No fever or chills.  Cardiovascular:   No chest pain or syncope. Respiratory:   No dyspnea or cough. Gastrointestinal:   positive as above for pelvic pain without vomiting and diarrhea.  Musculoskeletal:   Negative for focal pain or swelling All other systems reviewed and are negative except as documented above in ROS and HPI.  ____________________________________________   PHYSICAL EXAM:  VITAL SIGNS: ED Triage Vitals  Enc Vitals Group     BP 12/24/17 1808 116/75     Pulse Rate 12/24/17 1808 80     Resp --      Temp 12/24/17 1808 97.8 F (36.6 C)     Temp Source 12/24/17 1808 Oral     SpO2 12/24/17 1808 100 %     Weight 12/24/17 1808 175 lb (79.4 kg)     Height 12/24/17 1808 5\' 3"  (1.6 m)     Head Circumference --      Peak Flow --      Pain Score 12/24/17 1816 5     Pain Loc --  Pain Edu? --      Excl. in [place]? --     Vital signs reviewed, nursing assessments reviewed.   Constitutional:   Alert and oriented. Well appearing and in no distress. Eyes:   Conjunctivae are normal. EOMI. PERRL. ENT      Head:   Normocephalic and atraumatic.      Nose:   No congestion/rhinnorhea.       Mouth/Throat:   MMM, no pharyngeal erythema. No peritonsillar mass.       Neck:   No meningismus. Full ROM. Hematological/Lymphatic/Immunilogical:   No cervical lymphadenopathy. Cardiovascular:   RRR. Symmetric bilateral radial and DP pulses.  No murmurs.  Respiratory:   Normal respiratory effort without tachypnea/retractions. Breath sounds are clear and equal bilaterally. No wheezes/rales/rhonchi. Gastrointestinal:   Soft with significant left lower quadrant tenderness. Non distended. There is no CVA  tenderness.  No rebound, rigidity, or guarding. Genitourinary:   exam performed with nurse Jenn at bedside. external exam unremarkable. Speculum exam normal in appearance with a scant mucus discharge in the vault. Bimanual exam reveals mild cervical tenderness. No masses. No significant CMT. Musculoskeletal:   Normal range of motion in all extremities. No joint effusions.  No lower extremity tenderness.  No edema. Neurologic:   Normal speech and language.  Motor grossly intact. No acute focal neurologic deficits are appreciated.  Skin:    Skin is warm, dry and intact. No rash noted.  No petechiae, purpura, or bullae.  ____________________________________________    LABS (pertinent positives/negatives) (all labs ordered are listed, but only abnormal results are displayed) Labs Reviewed  COMPREHENSIVE METABOLIC PANEL - Abnormal; Notable for the following components:      Result Value   Glucose, Bld 115 (*)    All other components within normal limits  CBC - Abnormal; Notable for the following components:   WBC 12.8 (*)    All other components within normal limits  URINALYSIS, COMPLETE (UACMP) WITH MICROSCOPIC - Abnormal; Notable for the following components:   Color, Urine YELLOW (*)    APPearance CLOUDY (*)    Ketones, ur 5 (*)    All other components within normal limits  CHLAMYDIA/NGC RT PCR ([facility] ONLY)  WET PREP, GENITAL  LIPASE, BLOOD  POC URINE PREG, ED   ____________________________________________   EKG    ____________________________________________    RADIOLOGY  US Transvaginal Non-ob  Result Date: 12/24/2017 CLINICAL DATA:  Pelvic pain x1 day, worse on the left. History of dermoid surgically removed 12 years ago. EXAM: TRANSABDOMINAL AND TRANSVAGINAL ULTRASOUND OF PELVIS DOPPLER ULTRASOUND OF OVARIES TECHNIQUE: Both transabdominal and transvaginal ultrasound examinations of the pelvis were performed. Transabdominal technique was performed for global  imaging of the pelvis including uterus, ovaries, adnexal regions, and pelvic cul-de-sac. It was necessary to proceed with endovaginal exam following the transabdominal exam to visualize the endometrium and left ovary. Color and duplex Doppler ultrasound was utilized to evaluate blood flow to the ovaries. COMPARISON:  None. FINDINGS: Uterus Measurements: 7.9 x 3.1 x 3.5 cm. No fibroids or other mass visualized. Endometrium Thickness: 9.2 mm.  No focal abnormality visualized. Right ovary Measurements: 5 x 2.7 x 4 1 cm. Two adjacent follicles versus is a septated complex cyst overall measuring 3.9 x 1.6 x 3.2 cm is noted. Left ovary Measurements: 2.7 x 1.4 x 2.4 cm. Normal appearance/no adnexal mass. Pulsed Doppler evaluation of both ovaries demonstrates normal low-resistance arterial and venous waveforms. Other findings Elongated fluid collection in the left adnexa possibly representing stigmata of pelvic  inflammatory disease/hydrosalpinx with differential consideration including a pelvic inclusion cyst. IMPRESSION: 1. Elongated left adnexal fluid possibly representing a hydrosalpinx or stigmata of pelvic inflammatory disease. Pelvic inclusion cyst is a differential consideration. 2. Two adjacent follicles within the right ovary versus 1 septated complex cyst overall measuring 3.9 x 1.6 x 3.2 cm. This is almost certainly to represent a benign finding. 3. No torsion. Electronically Signed   By: Maicy Royalty M.D.   On: 12/24/2017 21:53   US Pelvis Complete  Result Date: 12/24/2017 CLINICAL DATA:  Pelvic pain x1 day, worse on the left. History of dermoid surgically removed 12 years ago. EXAM: TRANSABDOMINAL AND TRANSVAGINAL ULTRASOUND OF PELVIS DOPPLER ULTRASOUND OF OVARIES TECHNIQUE: Both transabdominal and transvaginal ultrasound examinations of the pelvis were performed. Transabdominal technique was performed for global imaging of the pelvis including uterus, ovaries, adnexal regions, and pelvic cul-de-sac. It was  necessary to proceed with endovaginal exam following the transabdominal exam to visualize the endometrium and left ovary. Color and duplex Doppler ultrasound was utilized to evaluate blood flow to the ovaries. COMPARISON:  None. FINDINGS: Uterus Measurements: 7.9 x 3.1 x 3.5 cm. No fibroids or other mass visualized. Endometrium Thickness: 9.2 mm.  No focal abnormality visualized. Right ovary Measurements: 5 x 2.7 x 4 1 cm. Two adjacent follicles versus is a septated complex cyst overall measuring 3.9 x 1.6 x 3.2 cm is noted. Left ovary Measurements: 2.7 x 1.4 x 2.4 cm. Normal appearance/no adnexal mass. Pulsed Doppler evaluation of both ovaries demonstrates normal low-resistance arterial and venous waveforms. Other findings Elongated fluid collection in the left adnexa possibly representing stigmata of pelvic inflammatory disease/hydrosalpinx with differential consideration including a pelvic inclusion cyst. IMPRESSION: 1. Elongated left adnexal fluid possibly representing a hydrosalpinx or stigmata of pelvic inflammatory disease. Pelvic inclusion cyst is a differential consideration. 2. Two adjacent follicles within the right ovary versus 1 septated complex cyst overall measuring 3.9 x 1.6 x 3.2 cm. This is almost certainly to represent a benign finding. 3. No torsion. Electronically Signed   By: Bora Royalty M.D.   On: 12/24/2017 21:53   [place] Art/ven Flow Abd Pelv Doppler  Result Date: 12/24/2017 CLINICAL DATA:  Pelvic pain x1 day, worse on the left. History of dermoid surgically removed 12 years ago. EXAM: TRANSABDOMINAL AND TRANSVAGINAL ULTRASOUND OF PELVIS DOPPLER ULTRASOUND OF OVARIES TECHNIQUE: Both transabdominal and transvaginal ultrasound examinations of the pelvis were performed. Transabdominal technique was performed for global imaging of the pelvis including uterus, ovaries, adnexal regions, and pelvic cul-de-sac. It was necessary to proceed with endovaginal exam following the transabdominal exam to  visualize the endometrium and left ovary. Color and duplex Doppler ultrasound was utilized to evaluate blood flow to the ovaries. COMPARISON:  None. FINDINGS: Uterus Measurements: 7.9 x 3.1 x 3.5 cm. No fibroids or other mass visualized. Endometrium Thickness: 9.2 mm.  No focal abnormality visualized. Right ovary Measurements: 5 x 2.7 x 4 1 cm. Two adjacent follicles versus is a septated complex cyst overall measuring 3.9 x 1.6 x 3.2 cm is noted. Left ovary Measurements: 2.7 x 1.4 x 2.4 cm. Normal appearance/no adnexal mass. Pulsed Doppler evaluation of both ovaries demonstrates normal low-resistance arterial and venous waveforms. Other findings Elongated fluid collection in the left adnexa possibly representing stigmata of pelvic inflammatory disease/hydrosalpinx with differential consideration including a pelvic inclusion cyst. IMPRESSION: 1. Elongated left adnexal fluid possibly representing a hydrosalpinx or stigmata of pelvic inflammatory disease. Pelvic inclusion cyst is a differential consideration. 2. Two adjacent follicles within the  right ovary versus 1 septated complex cyst overall measuring 3.9 x 1.6 x 3.2 cm. This is almost certainly to represent a benign finding. 3. No torsion. Electronically Signed   By: Tahjanae Royalty M.D.   On: 12/24/2017 21:53    ____________________________________________   PROCEDURES Procedures  ____________________________________________  DIFFERENTIAL DIAGNOSIS   ovarian cyst, torsion, pelvic mass  CLINICAL IMPRESSION / ASSESSMENT AND PLAN / ED COURSE  Pertinent labs & imaging results that were available during my care of the patient were reviewed by me and considered in my medical decision making (see chart for details).    patient well appearing no acute distress, normal vital signs, presents with pelvic pain and substantial left lower quadrant tenderness. With her medical history and the colicky nature of her pain, I will obtain an ultrasound of the pelvis  to evaluate for ovarian cyst or torsion. We will then reassess her symptoms and results.  ----------------------------------------- 10:13 PM on 12/24/2017 -----------------------------------------  Ultrasound concerning for possible hydrosalpinx. Pelvic exam completed without focal inflammatory changes to suggest STI. Swabs sent. I will discuss with gynecology for recommendations.   ----------------------------------------- 10:30 PM on 12/24/2017 -----------------------------------------  Discussed with Dr. Ouida Sills. Given overall unimpressive pelvic exam and lack of fever or other infectious findings, would treat symptomatically with pain control, follow-up with gynecology clinic tomorrow for further evaluation.controlled substance report system low risk.     ____________________________________________   FINAL CLINICAL IMPRESSION(S) / ED DIAGNOSES    Final diagnoses:  Pelvic pain in female  Left pelvic adnexal fluid collection     ED Discharge Orders    None      Portions of this note were generated with dragon dictation software. Dictation errors may occur despite best attempts at proofreading.    Carrie Mew, MD 12/24/17 2232    Carrie Mew, MD 12/24/17 2236

## 2017-12-24 NOTE — ED Triage Notes (Signed)
Pt reports new onset of intermittent lower pelvis pain that started this morning. Pt reports pain is sharp and cramping in nature. No NVD and no vaginal discharge reported. No recent periods. Hx of ovarian cysts but this does not feel like those per pt.

## 2017-12-24 NOTE — ED Notes (Signed)
Patient ambulatory to lobby with steady gait and NAD noted. Verbalized understanding of discharge instructions and follow-up care.  

## 2017-12-25 LAB — CHLAMYDIA/NGC RT PCR (ARMC ONLY)
Chlamydia Tr: NOT DETECTED
N GONORRHOEAE: NOT DETECTED

## 2018-02-11 ENCOUNTER — Other Ambulatory Visit: Payer: Self-pay

## 2018-02-11 ENCOUNTER — Encounter
Admission: RE | Admit: 2018-02-11 | Discharge: 2018-02-11 | Disposition: A | Discharge status: Home / Self Care | Payer: Self-pay | Source: Ambulatory Visit | Attending: Obstetrics and Gynecology | Admitting: Obstetrics and Gynecology

## 2018-02-11 HISTORY — DX: Pneumonia, unspecified organism: J18.9

## 2018-02-11 NOTE — H&P (Signed)
Lacey Harrison is a 32 y.o. female here for Rf by Dr Lance Coon follow up . Pt with an acute onset on bilateral pelvic pain left > Right starting 5 days ago . Seen in ED with possible left hydrosalpinx . Treated her with Vicodin and motrin .Neg GC / Chlamydia  Pt states that she has had chronic intermittent pelvic pain for years , usually starting 1 week before her cycle .  G1P0 trying to conceive for 2 yrs .  She is s/p ovarian cystectomy for dermoid 12 yrs ago complicated by cystotomy and repair Jola Babinski )   Past Medical History:  has a past medical history of Lyme disease and Tobacco use.  Past Surgical History:  has a past surgical history that includes left ovarian cystectomy (Left, 2007) and Dilation and curettage of uterus. Family History: family history is not on file. Social History:  reports that she has been smoking.  She has never used smokeless tobacco. She reports that she drinks alcohol. She reports that she does not use drugs. OB/GYN History:          OB History    Gravida  1   Para      Term      Preterm      AB  1   Living        SAB      TAB      Ectopic      Molar      Multiple      Live Births             Allergies: has No Known Allergies. Medications: No current outpatient medications on file.  Review of Systems: General:                      No fatigue or weight loss Eyes:                           No vision changes Ears:                            No hearing difficulty Respiratory:                No cough or shortness of breath Pulmonary:                  No asthma or shortness of breath Cardiovascular:           No chest pain, palpitations, dyspnea on exertion Gastrointestinal:          No abdominal bloating, chronic diarrhea, constipations, masses, pain or hematochezia Genitourinary:             No hematuria, dysuria, abnormal vaginal discharge, pelvic pain, Menometrorrhagia Lymphatic:                   No swollen  lymph nodes Musculoskeletal:         No muscle weakness Neurologic:                  No extremity weakness, syncope, seizure disorder Psychiatric:                  No history of depression, delusions or suicidal/homicidal ideation    Exam:      Vitals:  02/11/18   BP: 112/75  Pulse: 101    Body mass index is 31.35 kg/m.  WDWN white/  female in NAD   Lungs: CTA  CV : RRR without murmur   Neck:  no thyromegaly Abdomen: soft , no mass, normal active bowel sounds,  non-tender, no rebound tenderness Pelvic: tanner stage 5 ,  External genitalia: vulva /labia no lesions Urethra: no prolapse Vagina: normal physiologic d/c Cervix: no lesions,+ cervical motion tenderness   Uterus: normal size shape and contour, non-tender Adnexa: no mass,  non-tender   Rectovaginal: no mass heme negative  Impression:   The primary encounter diagnosis was Chronic pelvic pain in female. 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    Plan:   I have spoken with the patient regarding treatment options including expectant management, hormonal options, or surgical intervention. After a full discussion the pt elects to proceed with laparoscopy and possible salpingectomy          Caroline Sauger, MD

## 2018-02-11 NOTE — Patient Instructions (Addendum)
Your procedure is scheduled on: 02-16-18 MONDAY Report to Same Day Surgery 2nd floor medical mall [facility] Entrance-take elevator on left to 2nd floor.  Check in with surgery information desk.) To find out your arrival time please call [phone] between 1PM - 3PM on 02-13-18 FRIDAY  Remember: Instructions that are not followed completely may result in serious medical risk, up to and including death, or upon the discretion of your surgeon and anesthesiologist your surgery may need to be rescheduled.    _x___ 1. Do not eat food after midnight the night before your procedure. NO GUM OR CANDY AFTER MIDNIGHT.  You may drink clear liquids up to 2 hours before you are scheduled to arrive at the hospital for your procedure.  Do not drink clear liquids within 2 hours of your scheduled arrival to the hospital.  Clear liquids include  --Water or Apple juice without pulp  --Clear carbohydrate beverage such as ClearFast or Gatorade  --Black Coffee or Clear Tea (No milk, no creamers, do not add anything to the coffee or Tea     __x__ 2. No Alcohol for 24 hours before or after surgery.   __x__3. No Smoking or e-cigarettes for 24 prior to surgery.  Do not use any chewable tobacco products for at least 6 hour prior to surgery   ____  4. Bring all medications with you on the day of surgery if instructed.    __x__ 5. Notify your doctor if there is any change in your medical condition     (cold, fever, infections).    x___6. On the morning of surgery brush your teeth with toothpaste and water.  You may rinse your mouth with mouth wash if you wish.  Do not swallow any toothpaste or mouthwash.   Do not wear jewelry, make-up, hairpins, clips or nail polish.  Do not wear lotions, powders, or perfumes. You may wear deodorant.  Do not shave 48 hours prior to surgery. Men may shave face and neck.  Do not bring valuables to the hospital.    [facility] is not responsible for any belongings or  valuables.               Contacts, dentures or bridgework may not be worn into surgery.  Leave your suitcase in the car. After surgery it may be brought to your room.  For patients admitted to the hospital, discharge time is determined by your treatment team.  _  Patients discharged the day of surgery will not be allowed to drive home.  You will need someone to drive you home and stay with you the night of your procedure.    Please read over the following fact sheets that you were given:   [facility] Preparing for Surgery and or MRSA Information   _x___ Take anti-hypertensive listed below, cardiac, seizure, asthma, anti-reflux and psychiatric medicines. These include:  1. YOU MAY TAKE YOUR OXYCODONE DAY OF SURGERY IF NEEDED WITH A SMALL SIP OF WATER  2.  3.  4.  5.  6.  _X___Fleets enema as directed-DO FLEET ENEMA AT HOME 1 HOUR PRIOR TO ARRIVAL TIME TO HOSPITAL  _x___ Use CHG Soap or sage wipes as directed on instruction sheet   ____ Use inhalers on the day of surgery and bring to hospital day of surgery  ____ Stop Metformin and Janumet 2 days prior to surgery.    ____ Take 1/2 of usual insulin dose the night before surgery and none on the morning surgery.  ____ Follow recommendations from Cardiologist, Pulmonologist or PCP regarding stopping Aspirin, Coumadin, Plavix ,Eliquis, Effient, or Pradaxa, and Pletal.  X____Stop Anti-inflammatories such as Advil, Aleve, Ibuprofen, Motrin, Naproxen, Naprosyn, Goodies powders or aspirin products NOW-OK to take Tylenol OR OXYCODONE IF NEEDED   ____ Stop supplements until after surgery.     ____ Bring C-Pap to the hospital.

## 2018-02-13 ENCOUNTER — Encounter
Admission: RE | Admit: 2018-02-13 | Discharge: 2018-02-13 | Disposition: A | Discharge status: Home / Self Care | Payer: Self-pay | Source: Ambulatory Visit | Attending: Obstetrics and Gynecology | Admitting: Obstetrics and Gynecology

## 2018-02-13 DIAGNOSIS — Z01812 Encounter for preprocedural laboratory examination: Secondary | ICD-10-CM | POA: Insufficient documentation

## 2018-02-13 LAB — CBC
HCT: 46.1 % (ref 35.0–47.0)
Hemoglobin: 15.7 g/dL (ref 12.0–16.0)
MCH: 29.9 pg (ref 26.0–34.0)
MCHC: 34.1 g/dL (ref 32.0–36.0)
MCV: 87.7 fL (ref 80.0–100.0)
PLATELETS: 328 10*3/uL (ref 150–440)
RBC: 5.26 MIL/uL — ABNORMAL HIGH (ref 3.80–5.20)
RDW: 12.4 % (ref 11.5–14.5)
WBC: 8.4 10*3/uL (ref 3.6–11.0)

## 2018-02-13 LAB — BASIC METABOLIC PANEL
Anion gap: 13 (ref 5–15)
BUN: 14 mg/dL (ref 6–20)
CO2: 23 mmol/L (ref 22–32)
CREATININE: 0.57 mg/dL (ref 0.44–1.00)
Calcium: 9.3 mg/dL (ref 8.9–10.3)
Chloride: 103 mmol/L (ref 101–111)
GFR calc Af Amer: >60 mL/min (ref >60)
Glucose, Bld: 94 mg/dL (ref 65–99)
Potassium: 3.2 mmol/L — ABNORMAL LOW (ref 3.5–5.1)
SODIUM: 139 mmol/L (ref 135–145)

## 2018-02-13 LAB — TYPE AND SCREEN
ABO/RH(D): A POS
Antibody Screen: NEGATIVE

## 2018-02-16 ENCOUNTER — Ambulatory Visit: Payer: Self-pay | Admitting: Certified Registered"

## 2018-02-16 ENCOUNTER — Encounter: Payer: Self-pay | Admitting: Certified Registered"

## 2018-02-16 ENCOUNTER — Ambulatory Visit
Admission: RE | Admit: 2018-02-16 | Discharge: 2018-02-16 | Disposition: A | Discharge status: Home / Self Care | Payer: Self-pay | Source: Ambulatory Visit | Attending: Obstetrics and Gynecology | Admitting: Obstetrics and Gynecology

## 2018-02-16 ENCOUNTER — Encounter
Admission: RE | Disposition: A | Discharge status: Home / Self Care | Payer: Self-pay | Source: Ambulatory Visit | Attending: Obstetrics and Gynecology

## 2018-02-16 DIAGNOSIS — R102 Pelvic and perineal pain: Secondary | ICD-10-CM | POA: Insufficient documentation

## 2018-02-16 DIAGNOSIS — F17200 Nicotine dependence, unspecified, uncomplicated: Secondary | ICD-10-CM | POA: Insufficient documentation

## 2018-02-16 DIAGNOSIS — N736 Female pelvic peritoneal adhesions (postinfective): Secondary | ICD-10-CM | POA: Insufficient documentation

## 2018-02-16 HISTORY — PX: LAPAROSCOPY: SHX197

## 2018-02-16 HISTORY — PX: LAPAROSCOPIC LYSIS OF ADHESIONS: SHX5905

## 2018-02-16 HISTORY — PX: CHROMOPERTUBATION: SHX6288

## 2018-02-16 LAB — ABO/RH: ABO/RH(D): A POS

## 2018-02-16 LAB — POCT PREGNANCY, URINE: Preg Test, Ur: NEGATIVE

## 2018-02-16 SURGERY — LAPAROSCOPY OPERATIVE
Anesthesia: General | Wound class: Clean Contaminated

## 2018-02-16 MED ORDER — HYDROCODONE-ACETAMINOPHEN 5-325 MG PO TABS
1.0000 | ORAL_TABLET | Freq: Once | ORAL | Status: AC
  Administered 2018-02-16: 1 via ORAL

## 2018-02-16 MED ORDER — ROCURONIUM BROMIDE 100 MG/10ML IV SOLN
INTRAVENOUS | Status: DC | PRN
  Administered 2018-02-16: 10 mg via INTRAVENOUS
  Administered 2018-02-16: 50 mg via INTRAVENOUS

## 2018-02-16 MED ORDER — ROCURONIUM BROMIDE 50 MG/5ML IV SOLN
INTRAVENOUS | Status: AC
  Filled 2018-02-16: qty 1

## 2018-02-16 MED ORDER — PHENYLEPHRINE HCL 10 MG/ML IJ SOLN
INTRAMUSCULAR | Status: DC | PRN
  Administered 2018-02-16: 200 ug via INTRAVENOUS
  Administered 2018-02-16 (×2): 100 ug via INTRAVENOUS

## 2018-02-16 MED ORDER — LIDOCAINE HCL (PF) 2 % IJ SOLN
INTRAMUSCULAR | Status: AC
  Filled 2018-02-16: qty 10

## 2018-02-16 MED ORDER — LACTATED RINGERS IV SOLN: INTRAVENOUS | Status: DC

## 2018-02-16 MED ORDER — METHYLENE BLUE 0.5 % INJ SOLN
INTRAVENOUS | Status: AC
  Filled 2018-02-16: qty 10

## 2018-02-16 MED ORDER — GLYCOPYRROLATE 0.2 MG/ML IJ SOLN
INTRAMUSCULAR | Status: DC | PRN
  Administered 2018-02-16: 0.2 mg via INTRAVENOUS

## 2018-02-16 MED ORDER — ONDANSETRON HCL 4 MG/2ML IJ SOLN
INTRAMUSCULAR | Status: DC | PRN
  Administered 2018-02-16: 4 mg via INTRAVENOUS

## 2018-02-16 MED ORDER — KETOROLAC TROMETHAMINE 30 MG/ML IJ SOLN
INTRAMUSCULAR | Status: AC
  Filled 2018-02-16: qty 1

## 2018-02-16 MED ORDER — DEXAMETHASONE SODIUM PHOSPHATE 10 MG/ML IJ SOLN
INTRAMUSCULAR | Status: DC | PRN
  Administered 2018-02-16: 10 mg via INTRAVENOUS

## 2018-02-16 MED ORDER — METOPROLOL TARTRATE 5 MG/5ML IV SOLN
INTRAVENOUS | Status: DC | PRN
  Administered 2018-02-16: 1 mg via INTRAVENOUS

## 2018-02-16 MED ORDER — LIDOCAINE HCL (CARDIAC) PF 100 MG/5ML IV SOSY
PREFILLED_SYRINGE | INTRAVENOUS | Status: DC | PRN
  Administered 2018-02-16: 60 mg via INTRAVENOUS
  Administered 2018-02-16: 40 mg via INTRAVENOUS

## 2018-02-16 MED ORDER — PROPOFOL 10 MG/ML IV BOLUS
INTRAVENOUS | Status: AC
Start: 2018-02-16 — End: ?
  Filled 2018-02-16: qty 20

## 2018-02-16 MED ORDER — DEXAMETHASONE SODIUM PHOSPHATE 10 MG/ML IJ SOLN
INTRAMUSCULAR | Status: AC
  Filled 2018-02-16: qty 1

## 2018-02-16 MED ORDER — FLEET ENEMA 7-19 GM/118ML RE ENEM: 1.0000 | ENEMA | Freq: Once | RECTAL | Status: DC

## 2018-02-16 MED ORDER — SEVOFLURANE IN SOLN
RESPIRATORY_TRACT | Status: AC
  Filled 2018-02-16: qty 250

## 2018-02-16 MED ORDER — PROPOFOL 10 MG/ML IV BOLUS
INTRAVENOUS | Status: DC | PRN
  Administered 2018-02-16: 150 mg via INTRAVENOUS
  Administered 2018-02-16: 50 mg via INTRAVENOUS

## 2018-02-16 MED ORDER — FENTANYL CITRATE (PF) 100 MCG/2ML IJ SOLN: 25.0000 ug | INTRAMUSCULAR | Status: DC | PRN

## 2018-02-16 MED ORDER — SUGAMMADEX SODIUM 200 MG/2ML IV SOLN
INTRAVENOUS | Status: AC
  Filled 2018-02-16: qty 2

## 2018-02-16 MED ORDER — PROPOFOL 10 MG/ML IV BOLUS
INTRAVENOUS | Status: AC
  Filled 2018-02-16: qty 20

## 2018-02-16 MED ORDER — LACTATED RINGERS IV SOLN
INTRAVENOUS | Status: DC
  Administered 2018-02-16 (×2): via INTRAVENOUS

## 2018-02-16 MED ORDER — ACETAMINOPHEN 10 MG/ML IV SOLN
INTRAVENOUS | Status: DC | PRN
  Administered 2018-02-16: 1000 mg via INTRAVENOUS

## 2018-02-16 MED ORDER — HYDROMORPHONE HCL 1 MG/ML IJ SOLN
INTRAMUSCULAR | Status: AC
  Filled 2018-02-16: qty 1

## 2018-02-16 MED ORDER — MIDAZOLAM HCL 2 MG/2ML IJ SOLN
INTRAMUSCULAR | Status: DC | PRN
  Administered 2018-02-16: 4 mg via INTRAVENOUS

## 2018-02-16 MED ORDER — METHYLENE BLUE 0.5 % INJ SOLN
INTRAVENOUS | Status: DC | PRN
  Administered 2018-02-16: 12:00:00

## 2018-02-16 MED ORDER — ONDANSETRON HCL 4 MG/2ML IJ SOLN
INTRAMUSCULAR | Status: AC
  Filled 2018-02-16: qty 2

## 2018-02-16 MED ORDER — HYDROMORPHONE HCL 1 MG/ML IJ SOLN
INTRAMUSCULAR | Status: DC | PRN
  Administered 2018-02-16 (×2): 1 mg via INTRAVENOUS

## 2018-02-16 MED ORDER — SUGAMMADEX SODIUM 200 MG/2ML IV SOLN
INTRAVENOUS | Status: DC | PRN
  Administered 2018-02-16: 200 mg via INTRAVENOUS

## 2018-02-16 MED ORDER — BUPIVACAINE HCL 0.5 % IJ SOLN
INTRAMUSCULAR | Status: DC | PRN
  Administered 2018-02-16: 18 mL

## 2018-02-16 MED ORDER — FAMOTIDINE 20 MG PO TABS
20.0000 mg | ORAL_TABLET | Freq: Once | ORAL | Status: AC
  Administered 2018-02-16: 20 mg via ORAL

## 2018-02-16 MED ORDER — GLYCOPYRROLATE 0.2 MG/ML IJ SOLN
INTRAMUSCULAR | Status: AC
  Filled 2018-02-16: qty 1

## 2018-02-16 MED ORDER — MIDAZOLAM HCL 2 MG/2ML IJ SOLN
INTRAMUSCULAR | Status: AC
  Filled 2018-02-16: qty 4

## 2018-02-16 MED ORDER — KETAMINE HCL 10 MG/ML IJ SOLN
INTRAMUSCULAR | Status: DC | PRN
  Administered 2018-02-16: 20 mg via INTRAVENOUS
  Administered 2018-02-16: 30 mg via INTRAVENOUS

## 2018-02-16 MED ORDER — HYDROCODONE-ACETAMINOPHEN 5-325 MG PO TABS
ORAL_TABLET | ORAL | Status: AC
  Administered 2018-02-16: 1 via ORAL
  Filled 2018-02-16: qty 1

## 2018-02-16 MED ORDER — ACETAMINOPHEN 10 MG/ML IV SOLN
INTRAVENOUS | Status: AC
  Filled 2018-02-16: qty 100

## 2018-02-16 MED ORDER — ONDANSETRON HCL 4 MG/2ML IJ SOLN: 4.0000 mg | Freq: Once | INTRAMUSCULAR | Status: DC | PRN

## 2018-02-16 SURGICAL SUPPLY — 50 items
APPLICATOR ARISTA FLEXITIP XL (MISCELLANEOUS) ×5 IMPLANT
BAG URINE DRAINAGE (UROLOGICAL SUPPLIES) IMPLANT
BLADE SURG SZ11 CARB STEEL (BLADE) ×5 IMPLANT
CANISTER SUCT 1200ML W/VALVE (MISCELLANEOUS) ×5 IMPLANT
CATH FOLEY 2WAY  5CC 16FR (CATHETERS)
CATH ROBINSON RED A/P 16FR (CATHETERS) ×5 IMPLANT
CATH URTH 16FR FL 2W BLN LF (CATHETERS) IMPLANT
CHLORAPREP W/TINT 26ML (MISCELLANEOUS) ×5 IMPLANT
CLOSURE WOUND 1/2 X4 (GAUZE/BANDAGES/DRESSINGS) ×1
CLOSURE WOUND 1/4X4 (GAUZE/BANDAGES/DRESSINGS) ×1
DEFOGGER SCOPE WARMER CLEARIFY (MISCELLANEOUS) ×5 IMPLANT
DRSG TEGADERM 2-3/8X2-3/4 SM (GAUZE/BANDAGES/DRESSINGS) ×15 IMPLANT
GLOVE BIO SURGEON STRL SZ8 (GLOVE) ×20 IMPLANT
GOWN STRL REUS W/ TWL LRG LVL3 (GOWN DISPOSABLE) ×6 IMPLANT
GOWN STRL REUS W/ TWL XL LVL3 (GOWN DISPOSABLE) ×3 IMPLANT
GOWN STRL REUS W/TWL LRG LVL3 (GOWN DISPOSABLE) ×4
GOWN STRL REUS W/TWL XL LVL3 (GOWN DISPOSABLE) ×2
GRASPER SUT TROCAR 14GX15 (MISCELLANEOUS) IMPLANT
HEMOSTAT ARISTA ABSORB 1G (MISCELLANEOUS) ×5 IMPLANT
IRRIGATION STRYKERFLOW (MISCELLANEOUS) ×3 IMPLANT
IRRIGATOR STRYKERFLOW (MISCELLANEOUS) ×5
IV NS 1000ML (IV SOLUTION) ×2
IV NS 1000ML BAXH (IV SOLUTION) ×3 IMPLANT
KIT PINK PAD W/HEAD ARE REST (MISCELLANEOUS) ×5
KIT PINK PAD W/HEAD ARM REST (MISCELLANEOUS) ×3 IMPLANT
KIT TURNOVER CYSTO (KITS) ×5 IMPLANT
LABEL OR SOLS (LABEL) ×5 IMPLANT
NS IRRIG 500ML POUR BTL (IV SOLUTION) ×5 IMPLANT
PACK GYN LAPAROSCOPIC (MISCELLANEOUS) ×5 IMPLANT
PAD OB MATERNITY 4.3X12.25 (PERSONAL CARE ITEMS) ×5 IMPLANT
PAD PREP 24X41 OB/GYN DISP (PERSONAL CARE ITEMS) ×5 IMPLANT
POUCH SPECIMEN RETRIEVAL 10MM (ENDOMECHANICALS) IMPLANT
SCISSORS METZENBAUM CVD 33 (INSTRUMENTS) ×5 IMPLANT
SHEARS HARMONIC ACE PLUS 36CM (ENDOMECHANICALS) ×5 IMPLANT
SLEEVE ENDOPATH XCEL 5M (ENDOMECHANICALS) ×10 IMPLANT
SPONGE GAUZE 2X2 8PLY STER LF (GAUZE/BANDAGES/DRESSINGS)
SPONGE GAUZE 2X2 8PLY STRL LF (GAUZE/BANDAGES/DRESSINGS) IMPLANT
STRIP CLOSURE SKIN 1/2X4 (GAUZE/BANDAGES/DRESSINGS) ×4 IMPLANT
STRIP CLOSURE SKIN 1/4X4 (GAUZE/BANDAGES/DRESSINGS) ×4 IMPLANT
SUT VIC AB 0 CT1 36 (SUTURE) IMPLANT
SUT VIC AB 2-0 UR6 27 (SUTURE) IMPLANT
SUT VIC AB 4-0 SH 27 (SUTURE) ×2
SUT VIC AB 4-0 SH 27XANBCTRL (SUTURE) ×3 IMPLANT
SWABSTK COMLB BENZOIN TINCTURE (MISCELLANEOUS) ×5 IMPLANT
SYR 30ML LL (SYRINGE) ×5 IMPLANT
TROCAR ENDO BLADELESS 11MM (ENDOMECHANICALS) IMPLANT
TROCAR XCEL NON-BLD 5MMX100MML (ENDOMECHANICALS) ×10 IMPLANT
TROCAR XCEL UNIV SLVE 11M 100M (ENDOMECHANICALS) IMPLANT
TUBING ART PRESS 48 MALE/FEM (TUBING) ×5 IMPLANT
TUBING INSUFFLATION (TUBING) ×5 IMPLANT

## 2018-02-16 NOTE — Progress Notes (Signed)
Scheduled dx L/S and possible LOA and possible left salpingectomy . NPO .  HCG neg .  LAbs reviewed . K= 3.2 . Ready for surgery . Post op instructions given . Rx already dispensed

## 2018-02-16 NOTE — Transfer of Care (Signed)
Immediate Anesthesia Transfer of Care Note  Patient: Lacey Harrison  Procedure(s) Performed: LAPAROSCOPY OPERATIVE (N/A ) EXTENSIVE LAPAROSCOPIC LYSIS OF ADHESIONS (N/A ) CHROMOPERTUBATION  Patient Location: PACU  Anesthesia Type:General  Level of Consciousness: drowsy and patient cooperative  Airway & Oxygen Therapy: Patient Spontanous Breathing and Patient connected to face mask oxygen  Post-op Assessment: Report given to RN, Post -op Vital signs reviewed and stable and Patient moving all extremities  Post vital signs: Reviewed and stable  Last Vitals:  Vitals Value Taken Time  BP 98/63 02/16/2018  1:23 PM  Temp 36.6 C 02/16/2018  1:23 PM  Pulse 88 02/16/2018  1:25 PM  Resp 19 02/16/2018  1:25 PM  SpO2 97 % 02/16/2018  1:25 PM  Vitals shown include unvalidated device data.  Last Pain:  Vitals:   02/16/18 1018  TempSrc: Tympanic  PainSc: 0-No pain         Complications: No apparent anesthesia complications

## 2018-02-16 NOTE — Brief Op Note (Signed)
02/16/2018  1:09 PM  PATIENT:  Lacey Harrison  32 y.o. female  PRE-OPERATIVE DIAGNOSIS:  chronic pelvic pain Possible left hydrosalpinx POST-OPERATIVE DIAGNOSIS:  chronic pelvic pain Extensive abdominopelvic adhesions PROCEDURE:   Operative laparoscopy Extensive abdominal and pelvic adhesionlysis>90% of total operating time SURGEON:  Surgeon(s) and Role:    * Rashed Edler, Gwen Her, MD - Primary    * Benjaman Kindler, MD - Assisting  PHYSICIAN ASSISTANT:   ASSISTANTS: none   ANESTHESIA:   general  EBL:  50 mL   BLOOD ADMINISTERED:none  DRAINS: none   LOCAL MEDICATIONS USED:  MARCAINE     SPECIMEN:  No Specimen  DISPOSITION OF SPECIMEN:  N/A  COUNTS:  YES  TOURNIQUET:  * No tourniquets in log *  DICTATION: .Other Dictation: Dictation Number verbal  PLAN OF CARE: Discharge to home after PACU  PATIENT DISPOSITION:  PACU - hemodynamically stable.   Delay start of Pharmacological VTE agent (>24hrs) due to surgical blood loss or risk of bleeding: not applicable

## 2018-02-16 NOTE — Op Note (Signed)
NAME: Lacey Harrison, Lacey Harrison MEDICAL RECORD [id number] ACCOUNT [id number] DATE OF BIRTH:08-Mar-1986 FACILITY: [facility] LOCATION: [facility] PHYSICIAN:THOMAS Josefine Class, MD  OPERATIVE REPORT  DATE OF PROCEDURE:  02/16/2018  PREOPERATIVE DIAGNOSES:   1.  Chronic pelvic pain. 2.  Possible left hydrosalpinx.  POSTOPERATIVE DIAGNOSIS:  Significant abdominopelvic adhesive disease.  PROCEDURE:   1.  Operative laparoscopy with extensive abdominopelvic adhesiolysis incorporating greater than 90% of total operating time. 2.   Chromopertubation.  ANESTHESIA:  Spinal.  SURGEON:  Laverta Baltimore, MD.  FIRST ASSISTANT:  Leafy Ro  INDICATIONS:  This is a 32 year old gravida 1, para 0 patient with chronic pelvic pain for the past several months.  The patient was seen in the Emergency Department at [facility] and underwent an ultrasound that showed possible  dilation of left fallopian tube.  The patient is status post ovarian cystectomy for a dermoid cyst 12 years ago with an incidental cystotomy and repair.  DESCRIPTION OF PROCEDURE:  After adequate general endotracheal anesthesia, the patient was placed in dorsal supine position.  Legs placed in the [place].  The patient's abdomen, perineum and vagina were prepped and draped in normal sterile  fashion.  Timeout was performed.  The patient's bladder was catheterized with a red Robinson catheter yielding 50 mL concentrated urine.  A Kahn cannula was placed into the endocervical canal and attached to an anterior cervical tenaculum to be used for  uterine manipulation during the procedure.  Attention then was directed to the patient's abdomen.  A 5 mm infraumbilical incision was made and a 5 mm laparoscope was advanced into the abdominal cavity without difficulty.  Initial significant omental  adhesions were identified.  Two additional port sites were placed in the left lower quadrant 3 cm medial to the left anterior  iliac spine and under direct visualization a port was placed.  Similar procedure was repeated on the patient's right lower  quadrant, again 3 cm medial to the right anterior iliac spine a 5 mm port was placed.  Significant omental adhesions were noted.  Given the omental adhesions were draped around the infraumbilical incision, a fourth port site was placed at Palmer's point  in the left upper quadrant midclavicular line under direct visualization.  A 5 mm port was placed and the camera was placed through this.  Harmonic scalpel was brought up to the operative field and extensive adhesiolysis took place.  Ultimately, the  omentum was removed from the anterior abdominal wall.  In doing the dissection, the left broad ligament was entered as well as the left pelvic sidewall.  Ultimately, the uterus was then visualized, which showed several adhesions to the uterus to the  anterior cul-de-sac.  These were taken down sharply.  The right fallopian tube was somewhat kinked with small adhesions to a portion of small bowel and this was taken down with Harmonic scalpel and the fallopian tube then was deemed functional and  straightened.  The fimbriated end appeared normal.  The ovary was somewhat scarred into the fossa, which was opened with Harmonic scalpel.  The left fallopian tube was densely adherent to the sidewall and posterior cul-de-sac.  Meticulous dissection was  used to free most of the fallopian tube, but the fimbriated end was a hairpin turn back into the deep cul-de-sac and the fimbriated end was never visualized given the adhesions.  The left ovary was never visualized given the amount of omental and bowel  adhesions on the left pelvic sidewall.  Chromopertubation was then performed with methylene  blue with sterile water.  There was prompt filling and spilling through the right fallopian tube.  No evidence of spillage on the left.  The upper abdomen  appeared normal.  Of the total operating time of 150  minutes, greater than 90% of this time was spent with extensive adhesiolysis.  Multiple intraoperative pictures were taken.  The patient's abdomen was copiously irrigated and suctioned.  There was a  small amount of omental clotting that was noted.  No active bleeding and therefore, Arista was placed over top of this before the procedure was terminated.  Pressure was lowered to 7 mmHg and no active bleeding was noted.  The patient's abdomen was  deflated.  All port sites were removed.  All four ports sites were closed with interrupted 4-0 Vicryl suture.  Single tooth tenaculum was removed as well as the Kahn cannula and silver nitrate was used on the cervix for a small amount of bleeding.  There  were no complications.  The patient tolerated the procedure well and was taken to recovery room in good condition.    ESTIMATED BLOOD LOSS:  50 mL.  URINE OUTPUT:  50 mL.  INTRAOPERATIVE FLUID:  1500 mL.    The patient was taken to the recovery room in good condition.  TN/NUANCE  D:02/16/2018 T:02/16/2018 JOB:[id number]

## 2018-02-16 NOTE — Anesthesia Post-op Follow-up Note (Signed)
Anesthesia QCDR form completed.        

## 2018-02-16 NOTE — Anesthesia Preprocedure Evaluation (Addendum)
Anesthesia Evaluation  Patient identified by MRN, date of birth, ID band Patient awake    Reviewed: Allergy & Precautions, NPO status , Patient's Chart, lab work & pertinent test results  Airway Mallampati: II  TM Distance: >3 FB     Dental   Pulmonary pneumonia, resolved, Current Smoker,    Pulmonary exam normal        Cardiovascular negative cardio ROS Normal cardiovascular exam+ Valvular Problems/Murmurs      Neuro/Psych  Headaches, PSYCHIATRIC DISORDERS Depression    GI/Hepatic negative GI ROS, Neg liver ROS,   Endo/Other  negative endocrine ROS  Renal/GU negative Renal ROS  Female GU complaint     Musculoskeletal negative musculoskeletal ROS (+)   Abdominal Normal abdominal exam  (+)   Peds negative pediatric ROS (+)  Hematology negative hematology ROS (+)   Anesthesia Other Findings Past Medical History: No date: Chicken pox No date: Depression No date: Heart murmur No date: Lyme disease     Comment:  patient reports a remote history of lyme and alludes               that she has "chronic lyme" No date: Migraines     Comment:  MIGRAINES 07/2017: Pneumonia  Reproductive/Obstetrics                             Anesthesia Physical Anesthesia Plan  ASA: II  Anesthesia Plan: General   Post-op Pain Management:    Induction: Intravenous  PONV Risk Score and Plan:   Airway Management Planned: Oral ETT  Additional Equipment:   Intra-op Plan:   Post-operative Plan: Extubation in OR  Informed Consent: I have reviewed the patients History and Physical, chart, labs and discussed the procedure including the risks, benefits and alternatives for the proposed anesthesia with the patient or authorized representative who has indicated his/her understanding and acceptance.   Dental advisory given  Plan Discussed with: CRNA and Surgeon  Anesthesia Plan Comments:          Anesthesia Quick Evaluation

## 2018-02-16 NOTE — Anesthesia Procedure Notes (Signed)
Procedure Name: Intubation Date/Time: 02/16/2018 10:55 AM Performed by: Nile Riggs, CRNA Pre-anesthesia Checklist: Patient identified, Emergency Drugs available, Suction available, Patient being monitored and Timeout performed Patient Re-evaluated:Patient Re-evaluated prior to induction Oxygen Delivery Method: Circle system utilized Preoxygenation: Pre-oxygenation with 100% oxygen Induction Type: IV induction Ventilation: Mask ventilation without difficulty Laryngoscope Size: Miller and 2 Grade View: Grade I Tube type: Oral Tube size: 7.5 mm Number of attempts: 1 Airway Equipment and Method: Stylet Placement Confirmation: ETT inserted through vocal cords under direct vision,  positive ETCO2,  CO2 detector and breath sounds checked- equal and bilateral Secured at: 21 cm Tube secured with: Tape Dental Injury: Teeth and Oropharynx as per pre-operative assessment

## 2018-02-16 NOTE — Discharge Instructions (Signed)
Laparoscopic Lysis of Abdominal Adhesions, Care After Refer to this sheet in the next few weeks. These instructions provide you with information about caring for yourself after your procedure. Your health care provider may also give you more specific instructions. Your treatment has been planned according to current medical practices, but problems sometimes occur. Call your health care provider if you have any problems or questions after your procedure. What can I expect after the procedure? After your procedure, it is common to have some pain around the incision. Follow these instructions at home:  Take medicines only as directed by your health care provider.  You may need to start by eating only a little at a time. Start with liquids. As your appetite improves, gradually return to eating solid foods.  Do not lift anything that is heavier than 10 lb (4.5 kg) for four weeks.  Return to your normal activities as directed by your health care provider. Ask your health care provider what activities are safe for you.  There are many different ways to close and cover an incision, including stitches (sutures), skin glue, and adhesive strips. Follow instructions from your health care provider about: ? Incision care. ? Bandage (dressing) changes and removal. ? Incision closure removal.  Check your incisions every day for signs of infection. Watch for: ? Redness, swelling, or pain. ? Fluid , blood, or pus. Contact a health care provider if:  You develop a fever or chills.  You have redness, swelling, or pain at the site of your incisions.  You have fluid, blood, or pus coming from your incisions.  There is a bad smell coming from your incisions or the dressing.  Your pain gets worse.  You have a cough.  You have nausea and vomiting that does not go away after 3 hours. Get help right away if:  You develop severe pain in your abdomen or your chest.  You develop shortness of  breath.  You develop nausea or vomiting that is severe or keeps coming back. This information is not intended to replace advice given to you by your health care provider. Make sure you discuss any questions you have with your health care provider. Document Released: 12/27/2014 Document Revised: 01/18/2016 Document Reviewed: 08/08/2014 Elsevier Interactive Patient Education  2018 [place]   1) The drugs that you were given will stay in your system until tomorrow so for the next 24 hours you should not:  A) Drive an automobile B) Make any legal decisions C) Drink any alcoholic beverage   2) You may resume regular meals tomorrow.  Today it is better to start with liquids and gradually work up to solid foods.  You may eat anything you prefer, but it is better to start with liquids, then soup and crackers, and gradually work up to solid foods.   3) Please notify your doctor immediately if you have any unusual bleeding, trouble breathing, redness and pain at the surgery site, drainage, fever, or pain not relieved by medication.    4) Additional Instructions:        Please contact your physician with any problems or Same Day Surgery at [phone], Monday through Friday 6 am to 4 pm, or [place] at [facility] number at [phone].

## 2018-02-17 ENCOUNTER — Encounter: Payer: Self-pay | Admitting: Obstetrics and Gynecology

## 2018-02-17 NOTE — Anesthesia Postprocedure Evaluation (Signed)
Anesthesia Post Note  Patient: Lacey Harrison  Procedure(s) Performed: LAPAROSCOPY OPERATIVE (N/A ) EXTENSIVE LAPAROSCOPIC LYSIS OF ADHESIONS (N/A ) CHROMOPERTUBATION  Patient location during evaluation: PACU Anesthesia Type: General Level of consciousness: awake and alert and oriented Vital Signs Assessment: post-procedure vital signs reviewed and stable Respiratory status: spontaneous breathing Cardiovascular status: blood pressure returned to baseline Anesthetic complications: no     Last Vitals:  Vitals:   02/16/18 1413 02/16/18 1430  BP: (!) 92/57 (!) 93/57  Pulse: 94 88  Resp: 16 14  Temp: 36.6 C   SpO2: 93% 94%    Last Pain:  Vitals:   02/16/18 1430  TempSrc:   PainSc: Asleep                 Elasia Furnish

## 2018-12-25 DIAGNOSIS — H01009 Unspecified blepharitis unspecified eye, unspecified eyelid: Secondary | ICD-10-CM | POA: Insufficient documentation

## 2019-02-02 ENCOUNTER — Encounter: Payer: Self-pay | Admitting: Internal Medicine

## 2019-02-02 ENCOUNTER — Ambulatory Visit: Payer: Self-pay | Admitting: Internal Medicine

## 2019-02-02 ENCOUNTER — Other Ambulatory Visit: Payer: Self-pay

## 2019-02-02 VITALS — BP 112/68 | HR 87 | Temp 98.0°F | Ht 63.0 in | Wt 168.0 lb

## 2019-02-02 DIAGNOSIS — M542 Cervicalgia: Secondary | ICD-10-CM

## 2019-02-02 DIAGNOSIS — F329 Major depressive disorder, single episode, unspecified: Secondary | ICD-10-CM

## 2019-02-02 DIAGNOSIS — A6920 Lyme disease, unspecified: Secondary | ICD-10-CM | POA: Insufficient documentation

## 2019-02-02 DIAGNOSIS — G43909 Migraine, unspecified, not intractable, without status migrainosus: Secondary | ICD-10-CM | POA: Insufficient documentation

## 2019-02-02 DIAGNOSIS — F32A Depression, unspecified: Secondary | ICD-10-CM

## 2019-02-02 DIAGNOSIS — R221 Localized swelling, mass and lump, neck: Secondary | ICD-10-CM

## 2019-02-02 DIAGNOSIS — G43C1 Periodic headache syndromes in child or adult, intractable: Secondary | ICD-10-CM

## 2019-02-02 MED ORDER — AMITRIPTYLINE HCL 25 MG PO TABS: 25.0000 mg | ORAL_TABLET | Freq: Every day | ORAL | 0 refills | Status: DC

## 2019-02-02 NOTE — Patient Instructions (Signed)
Neck Exercises  Neck exercises can be important for many reasons:   They can help you to improve and maintain flexibility in your neck. This can be especially important as you age.   They can help to make your neck stronger. This can make movement easier.   They can reduce or prevent neck pain.   They may help your upper back.  Ask your health care provider which neck exercises would be best for you.  Exercises to improve neck flexibility  Neck stretch  Repeat this exercise 3-5 times.  1. Do this exercise while standing or while sitting in a chair.  2. Place your feet flat on the floor, shoulder-width apart.  3. Slowly turn your head to the right. Turn it all the way to the right so you can look over your right shoulder. Do not tilt or tip your head.  4. Hold this position for 10-30 seconds.  5. Slowly turn your head to the left, to look over your left shoulder.  6. Hold this position for 10-30 seconds.    Neck retraction  Repeat this exercise 8-10 times. Do this 3-4 times a day or as told by your health care provider.  1. Do this exercise while standing or while sitting in a sturdy chair.  2. Look straight ahead. Do not bend your neck.  3. Use your fingers to push your chin backward. Do not bend your neck for this movement. Continue to face straight ahead. If you are doing the exercise properly, you will feel a slight sensation in your throat and a stretch at the back of your neck.  4. Hold the stretch for 1-2 seconds. Relax and repeat.  Exercises to improve neck strength  Neck press  Repeat this exercise 10 times. Do it first thing in the morning and right before bed or as told by your health care provider.  1. Lie on your back on a firm bed or on the floor with a pillow under your head.  2. Use your neck muscles to push your head down on the pillow and straighten your spine.  3. Hold the position as well as you can. Keep your head facing up and your chin tucked.  4. Slowly count to 5 while holding this  position.  5. Relax for a few seconds. Then repeat.  Isometric strengthening  Do a full set of these exercises 2 times a day or as told by your health care provider.  1. Sit in a supportive chair and place your hand on your forehead.  2. Push forward with your head and neck while pushing back with your hand. Hold for 10 seconds.  3. Relax. Then repeat the exercise 3 times.  4. Next, do thesequence again, this time putting your hand against the back of your head. Use your head and neck to push backward against the hand pressure.  5. Finally, do the same exercise on either side of your head, pushing sideways against the pressure of your hand.  Prone head lifts  Repeat this exercise 5 times. Do this 2 times a day or as told by your health care provider.  1. Lie face-down, resting on your elbows so that your chest and upper back are raised.  2. Start with your head facing downward, near your chest. Position your chin either on or near your chest.  3. Slowly lift your head upward. Lift until you are looking straight ahead. Then continue lifting your head as far back as you   can stretch.  4. Hold your head up for 5 seconds. Then slowly lower it to your starting position.  Supine head lifts  Repeat this exercise 8-10 times. Do this 2 times a day or as told by your health care provider.  1. Lie on your back, bending your knees to point to the ceiling and keeping your feet flat on the floor.  2. Lift your head slowly off the floor, raising your chin toward your chest.  3. Hold for 5 seconds.  4. Relax and repeat.  Scapular retraction  Repeat this exercise 5 times. Do this 2 times a day or as told by your health care provider.  1. Stand with your arms at your sides. Look straight ahead.  2. Slowly pull both shoulders backward and downward until you feel a stretch between your shoulder blades in your upper back.  3. Hold for 10-30 seconds.  4. Relax and repeat.  Contact a health care provider if:   Your neck pain or  discomfort gets much worse when you do an exercise.   Your neck pain or discomfort does not improve within 2 hours after you exercise.  If you have any of these problems, stop exercising right away. Do not do the exercises again unless your health care provider says that you can.  Get help right away if:   You develop sudden, severe neck pain. If this happens, stop exercising right away. Do not do the exercises again unless your health care provider says that you can.  This information is not intended to replace advice given to you by your health care provider. Make sure you discuss any questions you have with your health care provider.  Document Released: 07/24/2015 Document Revised: 12/16/2017 Document Reviewed: 02/20/2015  Elsevier Interactive Patient Education  2019 [employer]vier Inc[employer]

## 2019-02-02 NOTE — Assessment & Plan Note (Addendum)
Not currently an issue.   Does not follow up with ID.

## 2019-02-02 NOTE — Assessment & Plan Note (Addendum)
Currently self-managed with mushrooms.   Support offered today

## 2019-02-02 NOTE — Progress Notes (Signed)
HPI  Patient presents to clinic today to establish care and for management of the conditions listed below.  Migraines: Triggered by stress, sinuses. These haven't occurred in a few months. She has sensitivity to light, has some floaters, sensitivity to sound, nausea and vomiting. She denies dizziness. She takes Ibuprofen with good results.  Chronic Lyme: Main complaints are fatigue and decreased metabolism. She is currently not on any treatment for this.  She does not follow with ID.  Depression: Mainly dysthymia. She self doses on "shrooms" with good relief of symptoms.  She denies SI/HI and anxiety.  Patient also complains of neck pain.  This has been intermittent over the past year.  She describes the pain as a severe tightness over her neck and shoulders that limits her ROM and ability to sleep.  She reports associated headaches with the neck pain.  She localizes the pain to her left neck and reports feeling a lump.  She takes Ibuprofen 400mg  BID for symptoms with some relief.  Flu:06/2018 Tetanus:06/2018 Pap smear: 2017- [facility] Dentist: biannually   Past Medical History:  Diagnosis Date  . Chicken pox   . Depression   . Heart murmur   . Lyme disease    patient reports a remote history of lyme and alludes that she has "chronic lyme"  . Migraines    MIGRAINES  . Pneumonia 07/2017    Current Outpatient Medications  Medication Sig Dispense Refill  . ibuprofen (ADVIL) 200 MG tablet Take 400 mg by mouth as needed.     No current facility-administered medications for this visit.     No Known Allergies  Family History  Problem Relation Age of Onset  . Mental illness Mother   . Mental illness Father   . Mental illness Maternal Grandmother   . Mental illness Paternal Grandmother     Social History   Socioeconomic History  . Marital status: Married    Spouse name: Not on file  . Number of children: Not on file  . Years of education: Not on file  . Highest  education level: Not on file  Occupational History  . Not on file  Social Needs  . Financial resource strain: Not on file  . Food insecurity:    Worry: Not on file    Inability: Not on file  . Transportation needs:    Medical: Not on file    Non-medical: Not on file  Tobacco Use  . Smoking status: Current Every Day Smoker    Packs/day: 0.75    Years: 14.00    Pack years: 10.50    Types: Cigarettes    Last attempt to quit: 07/05/2016    Years since quitting: 2.5  . Smokeless tobacco: Never Used  Substance and Sexual Activity  . Alcohol use: Yes    Alcohol/week: 3.0 standard drinks    Types: 3 Standard drinks or equivalent per week    Comment: almost daily  . Drug use: No  . Sexual activity: Not on file  Lifestyle  . Physical activity:    Days per week: Not on file    Minutes per session: Not on file  . Stress: Not on file  Relationships  . Social connections:    Talks on phone: Not on file    Gets together: Not on file    Attends religious service: Not on file    Active member of club or organization: Not on file    Attends meetings of clubs or organizations: Not on  file    Relationship status: Not on file  . Intimate partner violence:    Fear of current or ex partner: Not on file    Emotionally abused: Not on file    Physically abused: Not on file    Forced sexual activity: Not on file  Other Topics Concern  . Not on file  Social History Narrative  . Not on file    ROS:  Constitutional:  Pt reports headaches. Denies fever, malaise, fatigue, and abrupt weight changes.  HEENT: Denies eye pain, eye redness, ear pain, ringing in the ears, wax buildup, runny nose, nasal congestion, bloody nose, or sore throat. Respiratory: Denies difficulty breathing, shortness of breath, cough or sputum production.   Cardiovascular: Denies chest pain, chest tightness, palpitations or swelling in the hands or feet.  Musculoskeletal: Pt reports neck pain.  Denies difficulty with  gait, joint pain, or swelling.  Skin: Pt reports lump of left neck. Denies redness, rashes, lesions or ulcercations.  Neurological: Denies dizziness, difficulty with memory, difficulty with speech or problems with balance and coordination.  Psych: Pt has a history of anxiety and depression. Denies SI/HI.  No other specific complaints in a complete review of systems (except as listed in HPI above).  PE:  BP 112/68   Pulse 87   Temp 98 F (36.7 C) (Oral)   Ht 5\' 3"  (1.6 m)   Wt 168 lb (76.2 kg)   LMP 01/04/2019   SpO2 98%   BMI 29.76 kg/m  Wt Readings from Last 3 Encounters:  02/02/19 168 lb (76.2 kg)  02/16/18 173 lb (78.5 kg)  12/24/17 175 lb (79.4 kg)    General: Appears her stated age, well developed, well nourished in NAD. HEENT: Head: normal shape and size; Eyes: sclera white, no icterus, conjunctiva pink, PERRLA and EOMs intact;  Neck: Quarter-sized painful mass palpated on left neck just below ear. Neck supple, trachea midline. No thyromegaly present.  Cardiovascular: Normal rate and rhythm. S1,S2 noted.  No murmur, rubs or gallops noted.  Pulmonary/Chest: Normal effort and positive vesicular breath sounds. No respiratory distress. No wheezes, rales or ronchi noted.  Musculoskeletal: Pain with flexion, extension, and rotation of neck. No bony tenderness noted over the spine. No difficulty with gait.  Neurological: Alert and oriented. Coordination normal.  Psychiatric: Mood and affect normal. Behavior is normal. Judgment and thought content normal.     BMET    Component Value Date/Time   NA 139 02/13/2018 1236   K 3.2 (L) 02/13/2018 1236   CL 103 02/13/2018 1236   CO2 23 02/13/2018 1236   GLUCOSE 94 02/13/2018 1236   BUN 14 02/13/2018 1236   CREATININE 0.57 02/13/2018 1236   CALCIUM 9.3 02/13/2018 1236   GFRNONAA >60 02/13/2018 1236   GFRAA >60 02/13/2018 1236    Lipid Panel  No results found for: CHOL, TRIG, HDL, CHOLHDL, VLDL, LDLCALC  CBC    Component  Value Date/Time   WBC 8.4 02/13/2018 1236   RBC 5.26 (H) 02/13/2018 1236   HGB 15.7 02/13/2018 1236   HCT 46.1 02/13/2018 1236   PLT 328 02/13/2018 1236   MCV 87.7 02/13/2018 1236   MCH 29.9 02/13/2018 1236   MCHC 34.1 02/13/2018 1236   RDW 12.4 02/13/2018 1236    Hgb A1C No results found for: HGBA1C   Assessment and Plan:  Neck Pain:  ? Muscle tension vs strain Patient does not have insurance and would not like to pursue imaging at this time.  Amitriptyline 25 mg po QHS sent to pharmacy- Sedation precautions discussed. Instructed to notify me if symptoms persist or worsen.   Can continue Ibuprofen as needed.  RTC as needed Webb Silversmith, NP

## 2019-02-02 NOTE — Assessment & Plan Note (Addendum)
Will try amitriptyline 25 mg PO QHS- sent to pharmacy.

## 2019-06-14 ENCOUNTER — Ambulatory Visit: Payer: Self-pay | Admitting: Internal Medicine

## 2019-06-22 ENCOUNTER — Encounter: Payer: Self-pay | Admitting: Internal Medicine

## 2019-06-22 ENCOUNTER — Ambulatory Visit (INDEPENDENT_AMBULATORY_CARE_PROVIDER_SITE_OTHER): Payer: Self-pay | Admitting: Internal Medicine

## 2019-06-22 VITALS — BP 108/70 | HR 77 | Temp 98.1°F | Wt 162.0 lb

## 2019-06-22 DIAGNOSIS — D171 Benign lipomatous neoplasm of skin and subcutaneous tissue of trunk: Secondary | ICD-10-CM

## 2019-06-22 NOTE — Progress Notes (Signed)
Subjective:    Patient ID: Lacey Harrison, female    DOB: 03-05-1986, 33 y.o.   MRN: [id number]  HPI  Pt presents to the clinic today with c/o a mass under her left scapula. She noticed this 2-3 months ago. She has noticed pain with laying on the area prior. The pain will radiate into the top of her left shoulder. She describes the pain as sharp and shooting. She went for a massage last week and the masseuse thought it may be a lipoma but thought she should have it checked out. She has not taken anything OTC for the pain.   Review of Systems      Past Medical History:  Diagnosis Date  . Chicken pox   . Depression   . Heart murmur   . Lyme disease    patient reports a remote history of lyme and alludes that she has "chronic lyme"  . Migraines    MIGRAINES  . Pneumonia 07/2017    Current Outpatient Medications  Medication Sig Dispense Refill  . ibuprofen (ADVIL) 200 MG tablet Take 400 mg by mouth as needed.     No current facility-administered medications for this visit.     No Known Allergies  Family History  Problem Relation Age of Onset  . Mental illness Mother   . Mental illness Father   . Mental illness Maternal Grandmother   . Mental illness Paternal Grandmother     Social History   Socioeconomic History  . Marital status: Married    Spouse name: Not on file  . Number of children: Not on file  . Years of education: Not on file  . Highest education level: Not on file  Occupational History  . Not on file  Social Needs  . Financial resource strain: Not on file  . Food insecurity    Worry: Not on file    Inability: Not on file  . Transportation needs    Medical: Not on file    Non-medical: Not on file  Tobacco Use  . Smoking status: Current Every Day Smoker    Packs/day: 0.75    Years: 14.00    Pack years: 10.50    Types: Cigarettes    Last attempt to quit: 07/05/2016    Years since quitting: 2.9  . Smokeless tobacco: Never Used  Substance and  Sexual Activity  . Alcohol use: Yes    Alcohol/week: 3.0 standard drinks    Types: 3 Standard drinks or equivalent per week    Comment: almost daily  . Drug use: No  . Sexual activity: Not on file  Lifestyle  . Physical activity    Days per week: Not on file    Minutes per session: Not on file  . Stress: Not on file  Relationships  . Social Herbalist on phone: Not on file    Gets together: Not on file    Attends religious service: Not on file    Active member of club or organization: Not on file    Attends meetings of clubs or organizations: Not on file    Relationship status: Not on file  . Intimate partner violence    Fear of current or ex partner: Not on file    Emotionally abused: Not on file    Physically abused: Not on file    Forced sexual activity: Not on file  Other Topics Concern  . Not on file  Social History Narrative  . Not  on file     Constitutional: Denies fever, malaise, fatigue, headache or abrupt weight changes.  Respiratory: Denies difficulty breathing, shortness of breath, cough or sputum production.   Cardiovascular: Denies chest pain, chest tightness, palpitations or swelling in the hands or feet.  Musculoskeletal: Denies decrease in range of motion, difficulty with gait, muscle pain or joint pain and swelling.  Skin: Pt reports mass below left shoulder. Denies redness, rashes, lesions or ulcercations.  .  No other specific complaints in a complete review of systems (except as listed in HPI above).  Objective:   Physical Exam  BP 108/70   Pulse 77   Temp 98.1 F (36.7 C) (Temporal)   Wt 73.5 kg   LMP 06/21/2019   SpO2 98%   BMI 28.70 kg/m  Wt Readings from Last 3 Encounters:  06/22/19 73.5 kg  02/02/19 76.2 kg  02/16/18 78.5 kg    General: Appears her stated age, well developed, well nourished in NAD. Skin: Warm, dry and intact. Lipoma noted just below left scapular boarder. Cardiovascular: Normal rate and rhythm. S1,S2  noted.  No murmur, rubs or gallops noted.  Pulmonary/Chest: Normal effort and positive vesicular breath sounds. No respiratory distress. No wheezes, rales or ronchi noted.  Musculoskeletal: Normal internal and external rotation of the left shoulder. No pain with palpation of the shoulder. Strength 5/5 BUE. Neurological: Alert and oriented.   BMET    Component Value Date/Time   NA 139 02/13/2018 1236   K 3.2 (L) 02/13/2018 1236   CL 103 02/13/2018 1236   CO2 23 02/13/2018 1236   GLUCOSE 94 02/13/2018 1236   BUN 14 02/13/2018 1236   CREATININE 0.57 02/13/2018 1236   CALCIUM 9.3 02/13/2018 1236   GFRNONAA >60 02/13/2018 1236   GFRAA >60 02/13/2018 1236    Lipid Panel  No results found for: CHOL, TRIG, HDL, CHOLHDL, VLDL, LDLCALC  CBC    Component Value Date/Time   WBC 8.4 02/13/2018 1236   RBC 5.26 (H) 02/13/2018 1236   HGB 15.7 02/13/2018 1236   HCT 46.1 02/13/2018 1236   PLT 328 02/13/2018 1236   MCV 87.7 02/13/2018 1236   MCH 29.9 02/13/2018 1236   MCHC 34.1 02/13/2018 1236   RDW 12.4 02/13/2018 1236    Hgb A1C No results found for: HGBA1C          Assessment & Plan:   Lipoma of Left Upper Back:  Advised her this a benign fatty tumor She reports it is causing her pain and she would like it removed Referral to general surgery placed  Return precautions discussed Webb Silversmith, NP

## 2019-06-27 ENCOUNTER — Encounter: Payer: Self-pay | Admitting: Internal Medicine

## 2019-06-27 NOTE — Patient Instructions (Signed)
Lipoma  A lipoma is a noncancerous (benign) tumor that is made up of fat cells. This is a very common type of soft-tissue growth. Lipomas are usually found under the skin (subcutaneous). They may occur in any tissue of the body that contains fat. Common areas for lipomas to appear include the back, shoulders, buttocks, and thighs.  Lipomas grow slowly, and they are usually painless. Most lipomas do not cause problems and do not require treatment. What are the causes? The cause of this condition is not known. What increases the risk? You are more likely to develop this condition if:  You are 40-60 years old.  You have a family history of lipomas. What are the signs or symptoms? A lipoma usually appears as a small, round bump under the skin. In most cases, the lump will:  Feel soft or rubbery.  Not cause pain or other symptoms. However, if a lipoma is located in an area where it pushes on nerves, it can become painful or cause other symptoms. How is this diagnosed? A lipoma can usually be diagnosed with a physical exam. You may also have tests to confirm the diagnosis and to rule out other conditions. Tests may include:  Imaging tests, such as a CT scan or MRI.  Removal of a tissue sample to be looked at under a microscope (biopsy). How is this treated? Treatment for this condition depends on the size of the lipoma and whether it is causing any symptoms.  For small lipomas that are not causing problems, no treatment is needed.  If a lipoma is bigger or it causes problems, surgery may be done to remove the lipoma. Lipomas can also be removed to improve appearance. Most often, the procedure is done after applying a medicine that numbs the area (local anesthetic). Follow these instructions at home:  Watch your lipoma for any changes.  Keep all follow-up visits as told by your health care provider. This is important. Contact a health care provider if:  Your lipoma becomes larger or  hard.  Your lipoma becomes painful, red, or increasingly swollen. These could be signs of infection or a more serious condition. Get help right away if:  You develop tingling or numbness in an area near the lipoma. This could indicate that the lipoma is causing nerve damage. Summary  A lipoma is a noncancerous tumor that is made up of fat cells.  Most lipomas do not cause problems and do not require treatment.  If a lipoma is bigger or it causes problems, surgery may be done to remove the lipoma. This information is not intended to replace advice given to you by your health care provider. Make sure you discuss any questions you have with your health care provider. Document Released: 08/02/2002 Document Revised: 07/29/2017 Document Reviewed: 07/29/2017 Elsevier Patient Education  2020 [employer] Inc[employer]  

## 2019-07-12 ENCOUNTER — Other Ambulatory Visit: Payer: Self-pay

## 2019-07-12 ENCOUNTER — Ambulatory Visit: Payer: Self-pay | Admitting: Surgery

## 2019-07-12 ENCOUNTER — Encounter: Payer: Self-pay | Admitting: Surgery

## 2019-07-12 ENCOUNTER — Ambulatory Visit (INDEPENDENT_AMBULATORY_CARE_PROVIDER_SITE_OTHER): Payer: Self-pay | Admitting: Surgery

## 2019-07-12 VITALS — BP 110/76 | HR 75 | Temp 97.7°F | Resp 12 | Ht 62.0 in | Wt 162.0 lb

## 2019-07-12 DIAGNOSIS — Z720 Tobacco use: Secondary | ICD-10-CM | POA: Insufficient documentation

## 2019-07-12 DIAGNOSIS — D171 Benign lipomatous neoplasm of skin and subcutaneous tissue of trunk: Secondary | ICD-10-CM

## 2019-07-12 NOTE — Patient Instructions (Addendum)
Please arrive 15 minutes early.     Lipoma Removal Lipoma removal is a surgical procedure to remove a noncancerous (benign) tumor that is made up of fat cells (lipoma). Most lipomas are small and painless and do not require treatment. They can form in many areas of the body but are most common under the skin of the back, shoulders, arms, and thighs. You may need lipoma removal if you have a lipoma that is large, growing, or causing discomfort. Lipoma removal may also be done for cosmetic reasons. Tell a health care provider about:  Any allergies you have.  All medicines you are taking, including vitamins, herbs, eye drops, creams, and over-the-counter medicines.  Any problems you or family members have had with anesthetic medicines.  Any blood disorders you have.  Any surgeries you have had.  Any medical conditions you have.  Whether you are pregnant or may be pregnant. What are the risks? Generally, this is a safe procedure. However, problems may occur, including:  Infection.  Bleeding.  Allergic reactions to medicines.  Damage to nerves or blood vessels near the lipoma.  Scarring. What happens before the procedure? Staying hydrated Follow instructions from your health care provider about hydration, which may include:  Up to 2 hours before the procedure - you may continue to drink clear liquids, such as water, clear fruit juice, black coffee, and plain tea. Eating and drinking restrictions Follow instructions from your health care provider about eating and drinking, which may include:  8 hours before the procedure - stop eating heavy meals or foods such as meat, fried foods, or fatty foods.  6 hours before the procedure - stop eating light meals or foods, such as toast or cereal.  6 hours before the procedure - stop drinking milk or drinks that contain milk.  2 hours before the procedure - stop drinking clear liquids. Medicines  Ask your health care provider about:  ? Changing or stopping your regular medicines. This is especially important if you are taking diabetes medicines or blood thinners. ? Taking medicines such as aspirin and ibuprofen. These medicines can thin your blood. Do not take these medicines before your procedure if your health care provider instructs you not to.  You may be given antibiotic medicine to help prevent infection. General instructions  Ask your health care provider how your surgical site will be marked or identified.  You will have a physical exam. Your health care provider will check the size of the lipoma and whether it can be moved easily.  You may have imaging tests, such as: ? X-rays. ? CT scan. ? MRI.  Plan to have someone take you home from the hospital or clinic. What happens during the procedure?  To reduce your risk of infection: ? Your health care team will wash or sanitize their hands. ? Your skin will be washed with soap.  You will be given one or more of the following: ? A medicine to help you relax (sedative). ? A medicine to numb the area (local anesthetic). ? A medicine to make you fall asleep (general anesthetic). ? A medicine that is injected into an area of your body to numb everything below the injection site (regional anesthetic).  An incision will be made over the lipoma or very near the lipoma. The incision may be made in a natural skin line or crease.  Tissues, nerves, and blood vessels near the lipoma will be moved out of the way.  The lipoma and the capsule  that surrounds it will be separated from the surrounding tissues.  The lipoma will be removed.  The incision may be closed with stitches (sutures).  A bandage (dressing) will be placed over the incision. What happens after the procedure?  Do not drive for 24 hours if you received a sedative.  Your blood pressure, heart rate, breathing rate, and blood oxygen level will be monitored until the medicines you were given have worn  off. This information is not intended to replace advice given to you by your health care provider. Make sure you discuss any questions you have with your health care provider. Document Released: 10/26/2015 Document Revised: 04/04/2016 Document Reviewed: 10/26/2015 Elsevier Patient Education  2020 [employer].

## 2019-07-14 ENCOUNTER — Encounter: Payer: Self-pay | Admitting: Surgery

## 2019-07-14 NOTE — Progress Notes (Signed)
Patient ID: Lacey Harrison, female   DOB: Jul 22, 1986, 33 y.o.   MRN: [id number]  HPI Lacey Harrison is a 33 y.o. female seen in consultation at the request of Mrs. Lacey Gunner NP for a left back lipoma.  She reports she has had this for several months but over the recent past he has experienced some intermittent pain that is dull and mild to moderate intensity.  She also reports increasing size.  There is no specific alleviating or aggravating factors.  No fevers no chills.  No prior history of sarcoma radiation.  No family history of soft tissue tumors.  She is otherwise healthy and is able to perform more than 6 METS of activity without any shortness of breath or chest pain.  HPI  Past Medical History:  Diagnosis Date  . Chicken pox   . Depression   . Heart murmur   . Lyme disease    patient reports a remote history of lyme and alludes that she has "chronic lyme"  . Migraines    MIGRAINES  . Pneumonia 07/2017    Past Surgical History:  Procedure Laterality Date  . CHROMOPERTUBATION  02/16/2018   Procedure: CHROMOPERTUBATION;  Surgeon: Schermerhorn, Gwen Her, MD;  Location: [facility] ORS;  Service: Gynecology;;  . CYSTECTOMY Left    OVARY  . DILATION AND CURETTAGE OF UTERUS  2008  . LAPAROSCOPIC LYSIS OF ADHESIONS N/A 02/16/2018   Procedure: EXTENSIVE LAPAROSCOPIC LYSIS OF ADHESIONS;  Surgeon: Schermerhorn, Gwen Her, MD;  Location: [facility] ORS;  Service: Gynecology;  Laterality: N/A;  . LAPAROSCOPY N/A 02/16/2018   Procedure: LAPAROSCOPY OPERATIVE;  Surgeon: Ouida Sills Gwen Her, MD;  Location: [facility] ORS;  Service: Gynecology;  Laterality: N/A;    Family History  Problem Relation Age of Onset  . Mental illness Mother   . Mental illness Father   . Mental illness Maternal Grandmother   . Mental illness Paternal Grandmother     Social History Social History   Tobacco Use  . Smoking status: Current Every Day Smoker    Packs/day: 0.75    Years: 14.00    Pack years: 10.50    Types:  Cigarettes    Last attempt to quit: 07/05/2016    Years since quitting: 3.0  . Smokeless tobacco: Never Used  Substance Use Topics  . Alcohol use: Yes    Alcohol/week: 3.0 standard drinks    Types: 3 Standard drinks or equivalent per week    Comment: almost daily  . Drug use: No    No Known Allergies  Current Outpatient Medications  Medication Sig Dispense Refill  . ibuprofen (ADVIL) 200 MG tablet Take 400 mg by mouth as needed.     No current facility-administered medications for this visit.      Review of Systems Full ROS  was asked and was negative except for the information on the HPI  Physical Exam Blood pressure 110/76, pulse 75, temperature 97.7 F (36.5 C), temperature source Temporal, resp. rate 12, height 5\' 2"  (1.575 m), weight 162 lb (73.5 kg), last menstrual period 06/21/2019, SpO2 97 %. CONSTITUTIONAL: NAD EYES: Pupils are equal, round, , Sclera are non-icteric. LYMPH NODES:  Lymph nodes in the neck are normal. RESPIRATORY:  Lungs are clear. There is normal respiratory effort, with equal breath sounds bilaterally, and without pathologic use of accessory muscles. CARDIOVASCULAR: Heart is regular without murmurs, gallops, or rubs. GI: The abdomen is  soft, nontender, and nondistended. There are no palpable masses. There is no hepatosplenomegaly. There are normal  bowel sounds in all quadrants. GU: Rectal deferred.   MUSCULOSKELETAL: Normal muscle strength and tone. No cyanosis or edema.   SKIN:  There is evidence of a left subscapular soft tissue mass that is deep measures approximately 6 cm x 3 cm.  It is mobile and not attached to bony prominences  Turgor is good and there are no pathologic skin lesions or ulcers. NEUROLOGIC: Motor and sensation is grossly normal. Cranial nerves are grossly intact. PSYCH:  Oriented to person, place and time. Affect is normal.  Data Reviewed  I have personally reviewed the patient's imaging, laboratory findings and medical  records.    Assessment/Plan 33 year old female with a symptomatic lipoma of the left back.  Discussed with the patient in detail about her disease process.  Given that it has increased in size and is symptomatic I do recommend excision.  Discussed with the patient detail the options of excision either in the OR or here with local anesthetic.  The patient wishes and is adamant to have this performed in the office.  I did explain to her that there is a chance that if we feel that this was safe we will abort the procedure.  She understands but wants to try to get this excised here in the office under local anesthetic.  A copy of this report was sent to the referring provider    Caroleen Hamman, MD FACS General Surgeon 07/14/2019, 2:43 PM

## 2019-08-04 ENCOUNTER — Ambulatory Visit: Payer: Self-pay | Admitting: Surgery

## 2019-08-16 ENCOUNTER — Ambulatory Visit (HOSPITAL_BASED_OUTPATIENT_CLINIC_OR_DEPARTMENT_OTHER): Payer: 59 | Admitting: Surgery

## 2019-08-16 ENCOUNTER — Other Ambulatory Visit: Payer: Self-pay

## 2019-08-16 ENCOUNTER — Other Ambulatory Visit: Payer: Self-pay | Admitting: Surgery

## 2019-08-16 ENCOUNTER — Encounter: Payer: Self-pay | Admitting: Surgery

## 2019-08-16 VITALS — BP 117/84 | HR 76 | Temp 97.6°F | Resp 12 | Ht 63.0 in | Wt 167.2 lb

## 2019-08-16 DIAGNOSIS — D171 Benign lipomatous neoplasm of skin and subcutaneous tissue of trunk: Secondary | ICD-10-CM

## 2019-08-16 MED ORDER — TRAMADOL-ACETAMINOPHEN 37.5-325 MG PO TABS: 1.0000 | ORAL_TABLET | Freq: Four times a day (QID) | ORAL | 0 refills | Status: AC | PRN

## 2019-08-16 NOTE — Patient Instructions (Addendum)
Patient is not to submerge wound in water. Patient will need to refrain from showering for the next two days. Dr.Pabon has prescribed Tramadol to patient's local pharmacy. Patient will have a follow up in two week with a virtual visit. If you have any questions or concerns, please feel free to contact our office.   Lipoma Removal, Care After Refer to this sheet in the next few weeks. These instructions provide you with information about caring for yourself after your procedure. Your health care provider may also give you more specific instructions. Your treatment has been planned according to current medical practices, but problems sometimes occur. Call your health care provider if you have any problems or questions after your procedure. What can I expect after the procedure? After the procedure, it is common to have:  Mild pain.  Swelling.  Bruising. Follow these instructions at home:  Bathing  Do not take baths, swim, or use a hot tub until your health care provider approves. Ask your health care provider if you can take showers. You may only be allowed to take sponge baths for bathing.  Keep your bandage (dressing) dry until your health care provider says it can be removed. Incision care   Follow instructions from your health care provider about how to take care of your incision. Make sure you: ? Wash your hands with soap and water before you change your bandage (dressing). If soap and water are not available, use hand sanitizer. ? Change your dressing as told by your health care provider. ? Leave stitches (sutures), skin glue, or adhesive strips in place. These skin closures may need to stay in place for 2 weeks or longer. If adhesive strip edges start to loosen and curl up, you may trim the loose edges. Do not remove adhesive strips completely unless your health care provider tells you to do that.  Check your incision area every day for signs of infection. Check for: ? More redness,  swelling, or pain. ? Fluid or blood. ? Warmth. ? Pus or a bad smell. Driving  Do not drive or operate heavy machinery while taking prescription pain medicine.  Do not drive for 24 hours if you received a medicine to help you relax (sedative) during your procedure.  Ask your health care provider when it is safe for you to drive. General instructions  Take over-the-counter and prescription medicines only as told by your health care provider.  Do not use any tobacco products, such as cigarettes, chewing tobacco, and e-cigarettes. These can delay healing. If you need help quitting, ask your health care provider.  Return to your normal activities as told by your health care provider. Ask your health care provider what activities are safe for you.  Keep all follow-up visits as told by your health care provider. This is important. Contact a health care provider if:  You have more redness, swelling, or pain around your incision.  You have fluid or blood coming from your incision.  Your incision feels warm to the touch.  You have pus or a bad smell coming from your incision.  You have pain that does not get better with medicine. Get help right away if:  You have chills or a fever.  You have severe pain. This information is not intended to replace advice given to you by your health care provider. Make sure you discuss any questions you have with your health care provider. Document Released: 10/26/2015 Document Revised: 04/04/2016 Document Reviewed: 10/26/2015 Elsevier Patient Education  2020 Elsevier Inc.  

## 2019-08-16 NOTE — Progress Notes (Unsigned)
Operative Note Diagnosis: Large symptomatic lipoma post. Chest wall   Procedure 1. Excision of 8 cm Deep lipoma  ( underneath Trapezius muscle) Left posterior chest wall Periscapular Medial to inferior angle  Anesthesia: 40 cc lidocaine 1% w epi   EBL: minimal  Complications: none  Findings: 8 cm Deep lipoma  ( underneath Trapezius muscle) Left posterior chest wall Periscapular Medial to inferior angle  After informed consent was obtained the patient was prepped and draped in the usual sterile fashion.  Options of doing operative intervention in the OR was discussed with the patient in detail and the patient was adamant about doing the procedure in the office.  I did warn him specifically about potential toxicity related to lidocaine. A transverse incision was made in the left periscapular area after infiltrating lidocaine 1% with epinephrine as a field block.  We used the wheat Lander retractor and still injected more local anesthetic.  We felt that the lipoma was significantly deep and underneath the muscle.  Given this I continue to inject local anesthetic along the posterior chest wall.  I was able to incise the fascia and the muscle and split its fibers of the trapezius with a hemostat.  Using a couple of [employer] were able to retract and dissected the lipoma circumferentially from the chest wall.  The lipoma lack of a single name vessel or large vessel that needed to be ligated.  Once we were comfortable with the dissection the specimen was removed and sent to permanent pathology.  The specimen was also measured.  Adequate hemostasis was observed after he held pressure for approximately 3 minutes.  There was no evidence of active bleeding.  The fascia was closed with a running 3-0 Vicryl and the sub cutaneous tissue was closed with a running 3-0 Vicryl.  Skin was closed with a 4-0 Monocryl in a subcuticular fashion and Dermabond was used to coat the skin.  No complications, needle and  laparotomy counts were correct

## 2019-08-30 ENCOUNTER — Telehealth (INDEPENDENT_AMBULATORY_CARE_PROVIDER_SITE_OTHER): Payer: 59 | Admitting: Surgery

## 2019-08-30 ENCOUNTER — Other Ambulatory Visit: Payer: Self-pay

## 2019-08-30 DIAGNOSIS — Z09 Encounter for follow-up examination after completed treatment for conditions other than malignant neoplasm: Secondary | ICD-10-CM

## 2019-08-30 NOTE — Progress Notes (Signed)
S/p lipoma excision Back. Doing very well path d/w pt  She has no pain and is very happy w incision No concerns at thsi time RTC prn

## 2019-10-15 ENCOUNTER — Other Ambulatory Visit: Payer: Self-pay

## 2019-10-15 ENCOUNTER — Telehealth: Payer: Self-pay

## 2019-10-15 ENCOUNTER — Emergency Department
Admission: EM | Admit: 2019-10-15 | Discharge: 2019-10-15 | Disposition: A | Discharge status: Home / Self Care | Payer: No Typology Code available for payment source | Attending: Emergency Medicine | Admitting: Emergency Medicine

## 2019-10-15 ENCOUNTER — Emergency Department: Payer: No Typology Code available for payment source

## 2019-10-15 ENCOUNTER — Encounter: Payer: Self-pay | Admitting: Emergency Medicine

## 2019-10-15 DIAGNOSIS — G43809 Other migraine, not intractable, without status migrainosus: Secondary | ICD-10-CM | POA: Diagnosis not present

## 2019-10-15 DIAGNOSIS — H538 Other visual disturbances: Secondary | ICD-10-CM | POA: Diagnosis not present

## 2019-10-15 DIAGNOSIS — R4789 Other speech disturbances: Secondary | ICD-10-CM | POA: Insufficient documentation

## 2019-10-15 DIAGNOSIS — R519 Headache, unspecified: Secondary | ICD-10-CM | POA: Diagnosis present

## 2019-10-15 DIAGNOSIS — F17210 Nicotine dependence, cigarettes, uncomplicated: Secondary | ICD-10-CM | POA: Insufficient documentation

## 2019-10-15 DIAGNOSIS — G43109 Migraine with aura, not intractable, without status migrainosus: Secondary | ICD-10-CM

## 2019-10-15 DIAGNOSIS — R110 Nausea: Secondary | ICD-10-CM | POA: Diagnosis not present

## 2019-10-15 LAB — COMPREHENSIVE METABOLIC PANEL
ALT: 36 U/L (ref 0–44)
AST: 21 U/L (ref 15–41)
Albumin: 4.8 g/dL (ref 3.5–5.0)
Alkaline Phosphatase: 44 U/L (ref 38–126)
Anion gap: 10 (ref 5–15)
BUN: 14 mg/dL (ref 6–20)
CO2: 27 mmol/L (ref 22–32)
Calcium: 9.4 mg/dL (ref 8.9–10.3)
Chloride: 101 mmol/L (ref 98–111)
Creatinine, Ser: 0.67 mg/dL (ref 0.44–1.00)
GFR calc Af Amer: >60 mL/min (ref >60)
GFR calc non Af Amer: >60 mL/min (ref >60)
Glucose, Bld: 108 mg/dL — ABNORMAL HIGH (ref 70–99)
Potassium: 4.1 mmol/L (ref 3.5–5.1)
Sodium: 138 mmol/L (ref 135–145)
Total Bilirubin: 1.5 mg/dL — ABNORMAL HIGH (ref 0.3–1.2)
Total Protein: 8.3 g/dL — ABNORMAL HIGH (ref 6.5–8.1)

## 2019-10-15 LAB — CBC
HCT: 46 % (ref 36.0–46.0)
Hemoglobin: 15.8 g/dL — ABNORMAL HIGH (ref 12.0–15.0)
MCH: 29.8 pg (ref 26.0–34.0)
MCHC: 34.3 g/dL (ref 30.0–36.0)
MCV: 86.6 fL (ref 80.0–100.0)
Platelets: 295 10*3/uL (ref 150–400)
RBC: 5.31 MIL/uL — ABNORMAL HIGH (ref 3.87–5.11)
RDW: 12.1 % (ref 11.5–15.5)
WBC: 7.5 10*3/uL (ref 4.0–10.5)
nRBC: 0 % (ref 0.0–0.2)

## 2019-10-15 LAB — DIFFERENTIAL
Abs Immature Granulocytes: 0.02 10*3/uL (ref 0.00–0.07)
Basophils Absolute: 0 10*3/uL (ref 0.0–0.1)
Basophils Relative: 1 %
Eosinophils Absolute: 0.1 10*3/uL (ref 0.0–0.5)
Eosinophils Relative: 1 %
Immature Granulocytes: 0 %
Lymphocytes Relative: 21 %
Lymphs Abs: 1.6 10*3/uL (ref 0.7–4.0)
Monocytes Absolute: 0.4 10*3/uL (ref 0.1–1.0)
Monocytes Relative: 5 %
Neutro Abs: 5.4 10*3/uL (ref 1.7–7.7)
Neutrophils Relative %: 72 %

## 2019-10-15 LAB — GLUCOSE, CAPILLARY: Glucose-Capillary: 88 mg/dL (ref 70–99)

## 2019-10-15 LAB — PROTIME-INR
INR: 1 (ref 0.8–1.2)
Prothrombin Time: 12.6 seconds (ref 11.4–15.2)

## 2019-10-15 LAB — APTT: aPTT: 33 seconds (ref 24–36)

## 2019-10-15 MED ORDER — BUTALBITAL-APAP-CAFFEINE 50-325-40 MG PO TABS: 1.0000 | ORAL_TABLET | Freq: Four times a day (QID) | ORAL | 0 refills | Status: AC | PRN

## 2019-10-15 MED ORDER — SODIUM CHLORIDE 0.9% FLUSH: 3.0000 mL | Freq: Once | INTRAVENOUS | Status: DC

## 2019-10-15 MED ORDER — KETOROLAC TROMETHAMINE 30 MG/ML IJ SOLN
15.0000 mg | Freq: Once | INTRAMUSCULAR | Status: AC
  Administered 2019-10-15: 15 mg via INTRAVENOUS
  Filled 2019-10-15: qty 1

## 2019-10-15 MED ORDER — SODIUM CHLORIDE 0.9 % IV BOLUS
1000.0000 mL | Freq: Once | INTRAVENOUS | Status: AC
  Administered 2019-10-15: 1000 mL via INTRAVENOUS

## 2019-10-15 MED ORDER — DEXAMETHASONE SODIUM PHOSPHATE 10 MG/ML IJ SOLN
10.0000 mg | Freq: Once | INTRAMUSCULAR | Status: AC
  Administered 2019-10-15: 10 mg via INTRAVENOUS
  Filled 2019-10-15: qty 1

## 2019-10-15 MED ORDER — DIPHENHYDRAMINE HCL 50 MG/ML IJ SOLN
25.0000 mg | Freq: Once | INTRAMUSCULAR | Status: AC
  Administered 2019-10-15: 25 mg via INTRAVENOUS
  Filled 2019-10-15: qty 1

## 2019-10-15 MED ORDER — PROCHLORPERAZINE EDISYLATE 10 MG/2ML IJ SOLN
10.0000 mg | Freq: Once | INTRAMUSCULAR | Status: AC
  Administered 2019-10-15: 10 mg via INTRAVENOUS
  Filled 2019-10-15: qty 2

## 2019-10-15 NOTE — ED Provider Notes (Signed)
[facility] Emergency Department Provider Note  ____________________________________________   First MD Initiated Contact with Patient 10/15/19 1102     (approximate)  I have reviewed the triage vital signs and the nursing notes.   HISTORY  Chief Complaint Code Stroke    HPI Lacey Harrison is a 34 y.o. female  With h/o migraines, anxiety, here with headache, vision changes. Pt reports that her sx started this morning as initially, bright spots and color streaks in her right eye. She then had a central, moving aura of darkness within the visual field that moved with her eye when she changed views. She reports she then developed aching, generalized, throbbing HA along with increased muscle tension. She then noticed that she was having difficulty articulating or choosing what words to say. She was talking to her boss at the time who noticed. She then called her PCP office who told her to come to the ED. In the meantime, her visual changes have resolved but she does have ongoing 7/10, aching, generalized HA. She has some mild L jaw pain which she feels is 2/2 muscle tension. She also had a transient left lateral shoulder/upper chest pain that resolved. She admits to anxiety as well and feels her HA, sx have worsened this as well. No focal numbness or weakness.    Past Medical History:  Diagnosis Date  . Chicken pox   . Depression   . Heart murmur   . Lyme disease    patient reports a remote history of lyme and alludes that she has "chronic lyme"  . Migraines    MIGRAINES  . Pneumonia 07/2017    Patient Active Problem List   Diagnosis Date Noted  . Tobacco use 07/12/2019  . Lyme disease 02/02/2019  . Migraines 02/02/2019  . Depression 03/07/2016    Past Surgical History:  Procedure Laterality Date  . CHROMOPERTUBATION  02/16/2018   Procedure: CHROMOPERTUBATION;  Surgeon: Schermerhorn, Gwen Her, MD;  Location: [facility] ORS;  Service: Gynecology;;  .  CYSTECTOMY Left    OVARY  . DILATION AND CURETTAGE OF UTERUS  2008  . LAPAROSCOPIC LYSIS OF ADHESIONS N/A 02/16/2018   Procedure: EXTENSIVE LAPAROSCOPIC LYSIS OF ADHESIONS;  Surgeon: Schermerhorn, Gwen Her, MD;  Location: [facility] ORS;  Service: Gynecology;  Laterality: N/A;  . LAPAROSCOPY N/A 02/16/2018   Procedure: LAPAROSCOPY OPERATIVE;  Surgeon: Ouida Sills Gwen Her, MD;  Location: [facility] ORS;  Service: Gynecology;  Laterality: N/A;    Prior to Admission medications   Medication Sig Start Date End Date Taking? Authorizing Provider  butalbital-acetaminophen-caffeine (FIORICET) 50-325-40 MG tablet Take 1-2 tablets by mouth every 6 (six) hours as needed for headache. 10/15/19 10/14/20  Duffy Bruce, MD  ibuprofen (ADVIL) 200 MG tablet Take 400 mg by mouth as needed.    [provider]  traMADol-acetaminophen (ULTRACET) 37.5-325 MG tablet Take 1-2 tablets by mouth every 6 (six) hours as needed for moderate pain or severe pain. 08/16/19 08/15/20  Jules Husbands, MD    Allergies Tylenol [acetaminophen]  Family History  Problem Relation Age of Onset  . Mental illness Mother   . Mental illness Father   . Mental illness Maternal Grandmother   . Mental illness Paternal Grandmother     Social History Social History   Tobacco Use  . Smoking status: Current Every Day Smoker    Packs/day: 0.75    Years: 14.00    Pack years: 10.50    Types: Cigarettes    Last attempt to quit:  07/05/2016    Years since quitting: 3.2  . Smokeless tobacco: Never Used  Substance Use Topics  . Alcohol use: Yes    Alcohol/week: 3.0 standard drinks    Types: 3 Standard drinks or equivalent per week    Comment: almost daily  . Drug use: No    Review of Systems  Review of Systems  Constitutional: Negative for chills and fever.  HENT: Negative for sore throat.   Eyes: Positive for visual disturbance.  Respiratory: Negative for shortness of breath.   Cardiovascular: Negative for chest pain.   Gastrointestinal: Positive for nausea. Negative for abdominal pain.  Genitourinary: Negative for flank pain.  Musculoskeletal: Negative for neck pain.  Skin: Negative for rash and wound.  Allergic/Immunologic: Negative for immunocompromised state.  Neurological: Positive for headaches. Negative for weakness and numbness.  Hematological: Does not bruise/bleed easily.  Psychiatric/Behavioral: The patient is nervous/anxious.   All other systems reviewed and are negative.    ____________________________________________  PHYSICAL EXAM:      VITAL SIGNS: ED Triage Vitals  Enc Vitals Group     BP 10/15/19 1050 108/73     Pulse Rate 10/15/19 1050 78     Resp 10/15/19 1050 18     Temp 10/15/19 1050 98 F (36.7 C)     Temp Source 10/15/19 1050 Oral     SpO2 10/15/19 1050 100 %     Weight 10/15/19 1051 163 lb (73.9 kg)     Height 10/15/19 1051 5\' 3"  (1.6 m)     Head Circumference --      Peak Flow --      Pain Score 10/15/19 1050 7     Pain Loc --      Pain Edu? --      Excl. in [place]? --      Physical Exam Vitals and nursing note reviewed.  Constitutional:      General: She is not in acute distress.    Appearance: She is well-developed.  HENT:     Head: Normocephalic and atraumatic.  Eyes:     Conjunctiva/sclera: Conjunctivae normal.  Cardiovascular:     Rate and Rhythm: Normal rate and regular rhythm.     Heart sounds: Normal heart sounds.  Pulmonary:     Effort: Pulmonary effort is normal. No respiratory distress.     Breath sounds: No wheezing.  Abdominal:     General: There is no distension.  Musculoskeletal:     Cervical back: Neck supple.  Skin:    General: Skin is warm.     Capillary Refill: Capillary refill takes less than 2 seconds.     Findings: No rash.  Neurological:     Mental Status: She is alert and oriented to person, place, and time.     Motor: No abnormal muscle tone.  Psychiatric:     Comments: Anxious      Neurological Exam:  Mental Status:  Alert and oriented to person, place, and time. Attention and concentration normal. Speech clear. Recent memory is intact. Cranial Nerves: Visual fields grossly intact. EOMI and PERRLA. No nystagmus noted. Facial sensation intact at forehead, maxillary cheek, and chin/mandible bilaterally. No facial asymmetry or weakness. Hearing grossly normal. Uvula is midline, and palate elevates symmetrically. Normal SCM and trapezius strength. Tongue midline without fasciculations. Motor: Muscle strength 5/5 in proximal and distal UE and LE bilaterally. No pronator drift. Muscle tone normal.  Sensation: Intact to light touch in upper and lower extremities distally bilaterally.  Gait: Normal without  ataxia. Coordination: Normal FTN bilaterally.    ____________________________________________   LABS (all labs ordered are listed, but only abnormal results are displayed)  Labs Reviewed  CBC - Abnormal; Notable for the following components:      Result Value   RBC 5.31 (*)    Hemoglobin 15.8 (*)    All other components within normal limits  COMPREHENSIVE METABOLIC PANEL - Abnormal; Notable for the following components:   Glucose, Bld 108 (*)    Total Protein 8.3 (*)    Total Bilirubin 1.5 (*)    All other components within normal limits  GLUCOSE, CAPILLARY  PROTIME-INR  APTT  DIFFERENTIAL  CBG MONITORING, ED  POC URINE PREG, ED    ____________________________________________  EKG: Normal sinus rhythm, VR 81. PR 146, QRS 82, QTc 439. No acute ST elevations or depressions. No arrhythmia. ________________________________________  RADIOLOGY All imaging, including plain films, CT scans, and ultrasounds, independently reviewed by me, and interpretations confirmed via formal radiology reads.  ED MD interpretation:   CT Head: Negative, no acute abnormality  Official radiology report(s): CT HEAD CODE STROKE WO CONTRAST  Result Date: 10/15/2019 CLINICAL DATA:  Code stroke. Acute onset confusion,  visual changes, and unable to speak. EXAM: CT HEAD WITHOUT CONTRAST TECHNIQUE: Contiguous axial images were obtained from the base of the skull through the vertex without intravenous contrast. COMPARISON:  None. FINDINGS: Brain: No acute infarct, hemorrhage, or mass lesion is present. The ventricles are of normal size. No significant extraaxial fluid collection is present. Pineal gland is calcified. Midline structures are otherwise unremarkable. Vascular: No hyperdense vessel or unexpected calcification. Skull: Calvarium is intact. No focal lytic or blastic lesions are present. No significant extracranial soft tissue lesion is present. Sinuses/Orbits: The paranasal sinuses and mastoid air cells are clear. The globes and orbits are within normal limits. ASPECTS [facility] Stroke Program Early CT Score) - Ganglionic level infarction (caudate, lentiform nuclei, internal capsule, insula, M1-M3 cortex): 7/7 - Supraganglionic infarction (M4-M6 cortex): 3/3 Total score (0-10 with 10 being normal): 10/10 IMPRESSION: 1. Normal CT appearance the brain 2. ASPECTS is 10/10 These results were called by telephone at the time of interpretation on 10/15/2019 at 11:12 am to provider [facility] , who verbally acknowledged these results. Electronically Signed   By: San Morelle M.D.   On: 10/15/2019 11:15    ____________________________________________  PROCEDURES   Procedure(s) performed (including Critical Care):  Procedures  ____________________________________________  INITIAL IMPRESSION / MDM / [place] / ED COURSE  As part of my medical decision making, I reviewed the following data within the [place] notes reviewed and incorporated, Old chart reviewed, Notes from prior ED visits, and [place] Controlled Substance Database       *SHELISE CABANILLAS was evaluated in Emergency Department on 10/15/2019 for the symptoms described in the history of present illness. She was  evaluated in the context of the global COVID-19 pandemic, which necessitated consideration that the patient might be at risk for infection with the SARS-CoV-2 virus that causes COVID-19. Institutional protocols and algorithms that pertain to the evaluation of patients at risk for COVID-19 are in a state of rapid change based on information released by regulatory bodies including the CDC and federal and state organizations. These policies and algorithms were followed during the patient's care in the ED.  Some ED evaluations and interventions may be delayed as a result of limited staffing during the pandemic.*     Medical Decision Making:  Very pleasant 34  yo F here with visual aura, headache likely 2/2 complicated migraine. History of complex migraines w/ aura, though aura has new elements compared to priors. No focal numbness, weakness, and neuro sx are largely resolved, also transient in nature more c/w vasospasm/migraine rather than CVA. Pt initially activated as stroke, CT head obtained and reviewed and is negative. Neuro exam is normal at this time. She has no h/o afib, arrhythmia, or other high risk features for early CVA (mild COVID in Nov, resolved). Distribution of sx is not vascular/territorial. No other sx to suggest MS or demyelinating d/o.   Headache and neuro sx resolved with migraine cocktail. Will have her f/u with a Neurologist as she has not seen one previously, give brief course of Fioricet, and d/c home with good return precautions.  ____________________________________________  FINAL CLINICAL IMPRESSION(S) / ED DIAGNOSES  Final diagnoses:  Complicated migraine     MEDICATIONS GIVEN DURING THIS VISIT:  Medications  sodium chloride flush (NS) 0.9 % injection 3 mL (3 mLs Intravenous Not Given 10/15/19 1113)  prochlorperazine (COMPAZINE) injection 10 mg (10 mg Intravenous Given 10/15/19 1137)  diphenhydrAMINE (BENADRYL) injection 25 mg (25 mg Intravenous Given 10/15/19 1137)   dexamethasone (DECADRON) injection 10 mg (10 mg Intravenous Given 10/15/19 1137)  sodium chloride 0.9 % bolus 1,000 mL (1,000 mLs Intravenous New Bag/Given 10/15/19 1138)  ketorolac (TORADOL) 30 MG/ML injection 15 mg (15 mg Intravenous Given 10/15/19 1137)     ED Discharge Orders         Ordered    butalbital-acetaminophen-caffeine (FIORICET) 50-325-40 MG tablet  Every 6 hours PRN     10/15/19 1201           Note:  This document was prepared using Dragon voice recognition software and may include unintentional dictation errors.   Duffy Bruce, MD 10/15/19 1201

## 2019-10-15 NOTE — Telephone Encounter (Signed)
Pt said this morning about 1 hr ago pt started with blurred vision with light flashes in rt eye and for short period pt could not get words out. Pt knew what she wanted to say but could not speak. Pt had severe H/A. Now pt vision is much better but still blurred. Pt was advised to go to ED for eval and possible testing. Pt voiced understanding and denied 911; then pt had difficulty talking again; pt said she knew what she needed to say but words would not come out. Pt was speaking hesitantly; pt still declined 911 but ask me to call her mom. pts mom said she could be there in 10' or less. I advised would stay on phone with pt. When I told pt her mom was on her way and I was going to stay on phone with her until her mom arrived; pt said she had a sudden heaviness in her chest with some sharp pain. I told pt I could call 911 and pt said no she wondered if could be anxiety. Pt did some slow deep breathes and pt said her chest felt OK. Pt had no dizziness or SOB. Pt has no numbness,tingling or difficulty walking or using her arms.pt did say she had positive covid thru [facility] in Nov 2020. pts mom arrived and is on the way to [facility] ED which could take 10' to arrive. FYI to Avie Echevaria NP. I did call [facility] ED triage to let them know pt was on her way and above info.

## 2019-10-15 NOTE — ED Triage Notes (Signed)
Pt to ER states around 8:30 noted that she had visual changes.  States she thought it was an oncoming migraine.  Pt states headache started 30 mins ago and that when she was talking with nurse she was unable to get her words out.  Pt states she has anxiety and feels like this is making everything worse.  Pt states only symptoms that remains is headache.

## 2019-10-15 NOTE — Discharge Instructions (Signed)
Call [facility] or other local Neurologist to set up an appointment. Both Dr. Manuella Ghazi and/or Dr. Melrose Nakayama are local Neurologists.  Drink plenty of fluids. Avoid excess caffeine. Try to get at least 8 hours of sleep.  Take the prescribed medication as needed.

## 2019-10-15 NOTE — Telephone Encounter (Signed)
Agree with advice given

## 2019-10-15 NOTE — ED Notes (Signed)
Notified Secretary to call out code stroke to neurologist.  Pt to CT at this time.

## 2019-10-15 NOTE — ED Notes (Signed)
Dr. Ellender Hose to room for evaluation.

## 2020-06-05 IMAGING — CT CT HEAD CODE STROKE
3 series · 15 of 45 positions shown, 18 images · non-contrast
Comparison: None.

CLINICAL DATA: Code stroke. Acute onset confusion, visual changes,
and unable to speak.

EXAM:
CT HEAD WITHOUT CONTRAST
TECHNIQUE: Contiguous axial images were obtained from the base of the skull
through the vertex without intravenous contrast.

[Series 4: coronal soft tissue · coronal · 0.29mm/px · 3 of 63 slices shown]
[im 21/63  brain]
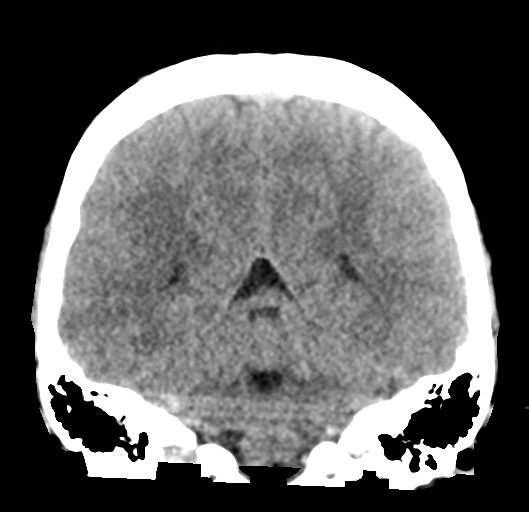
[im 28/63  brain]
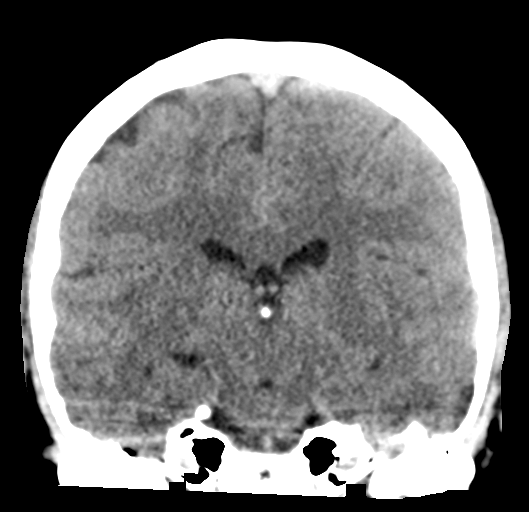
[im 35/63  brain]
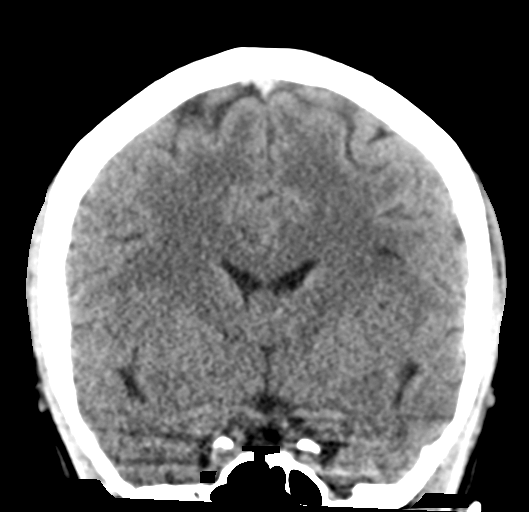

[Series 5: sagittal soft tissue · sagittal · 0.29mm/px · 3 of 51 slices shown]
[im 17/51  brain]
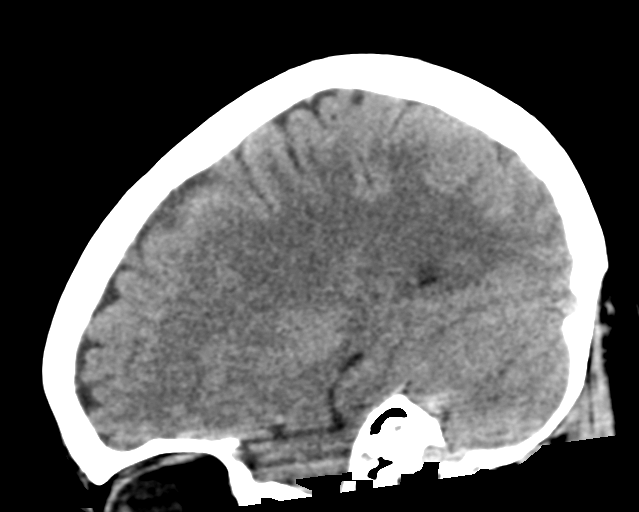
[im 26/51  brain]
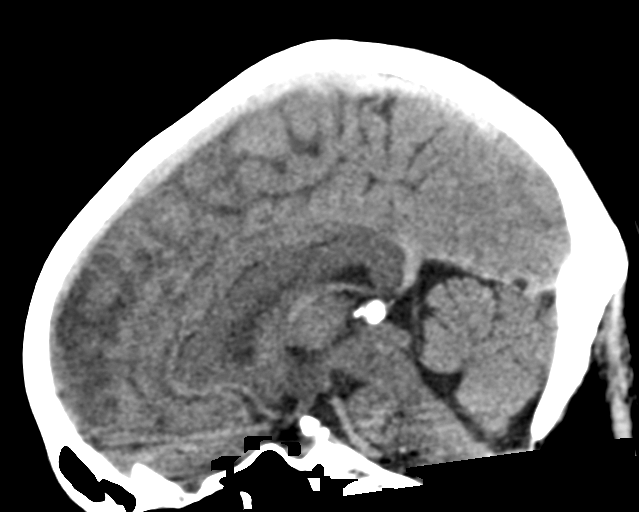
[im 34/51  brain]
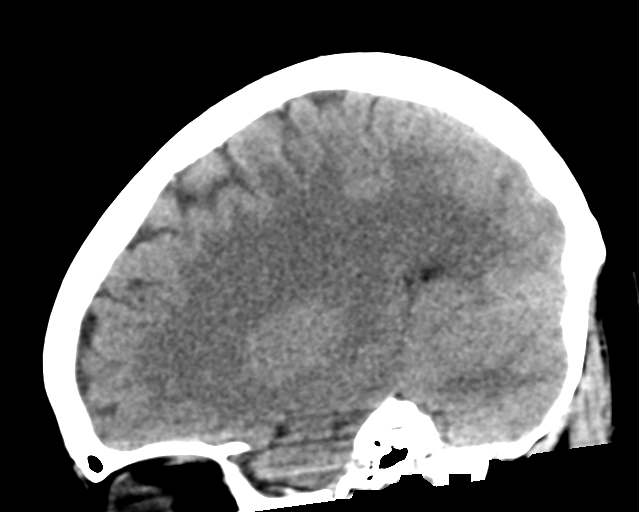

[Series 6: head wo 1 · axial · 0.41mm/px · z∈[-121,-6]mm · 9 of 28 slices shown, 12 images]
[im 3/28  brain]
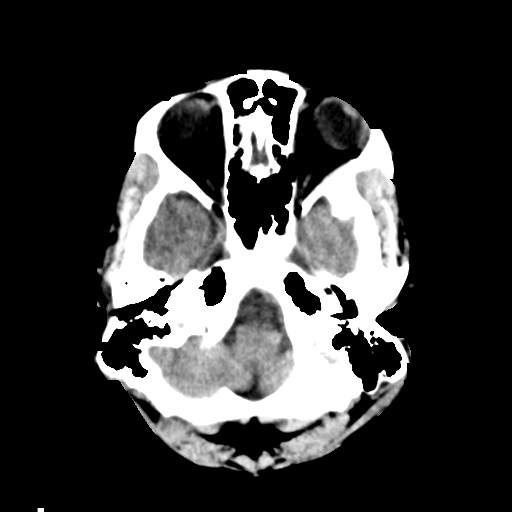
[im 3/28  bone]
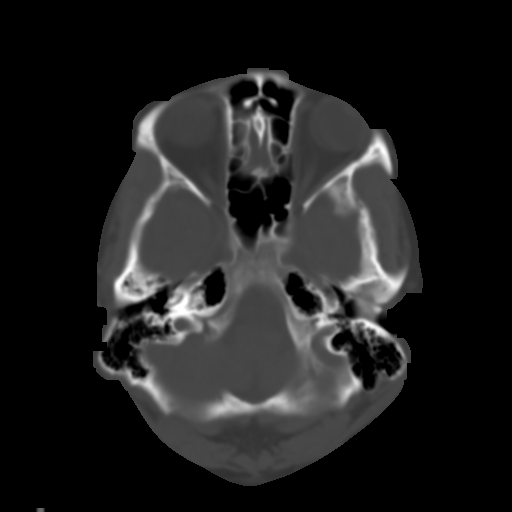
[im 6/28  brain]
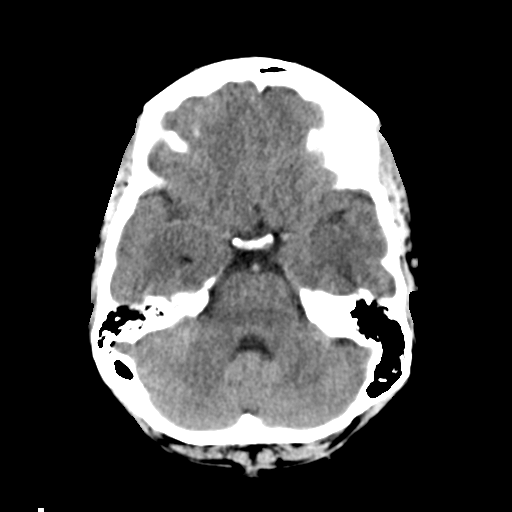
[im 9/28  brain]
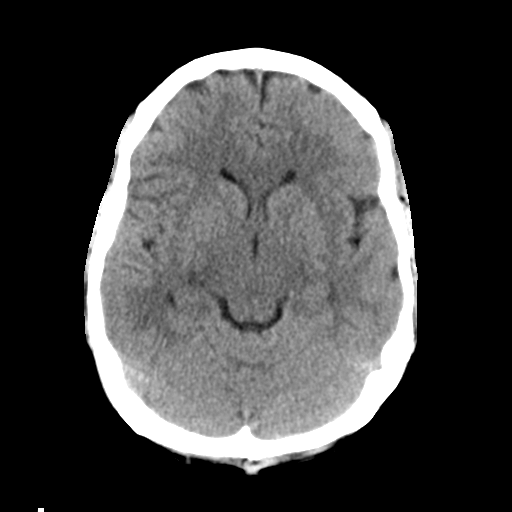
[im 12/28  brain]
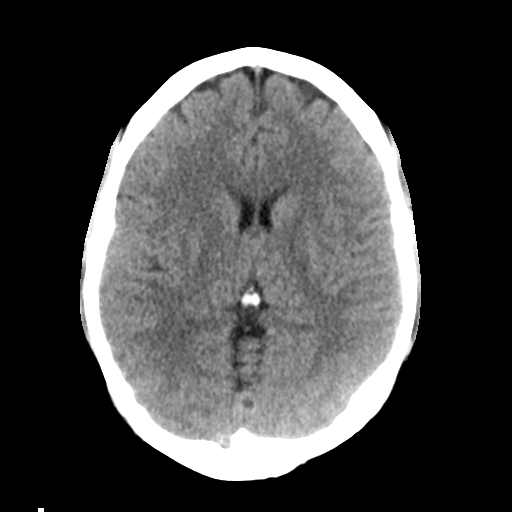
[im 15/28  brain]
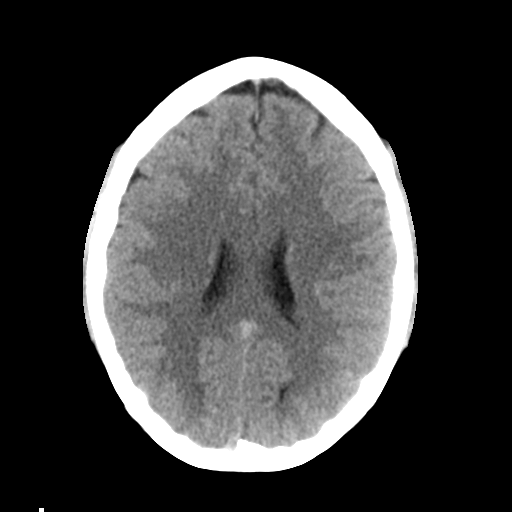
[im 15/28  bone]
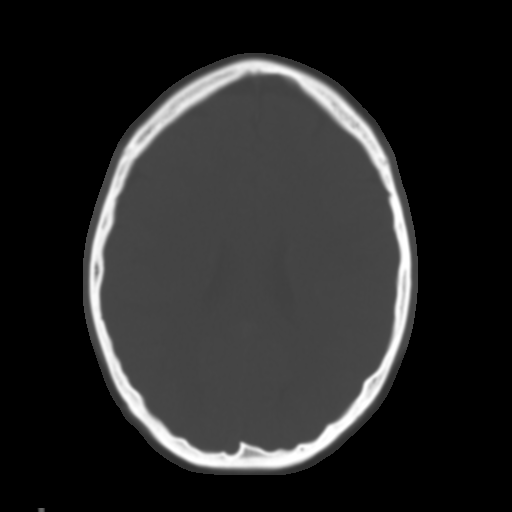
[im 17/28  brain]
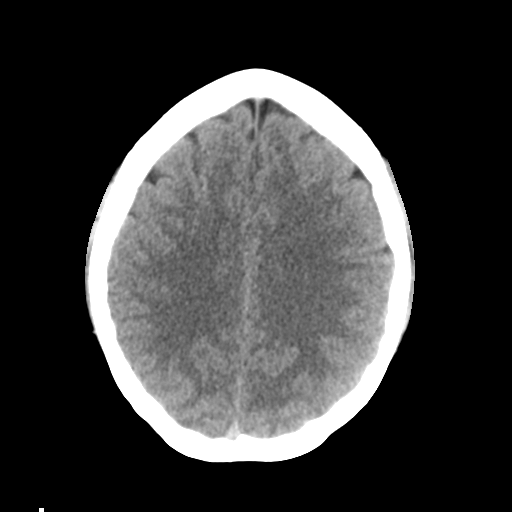
[im 20/28  brain]
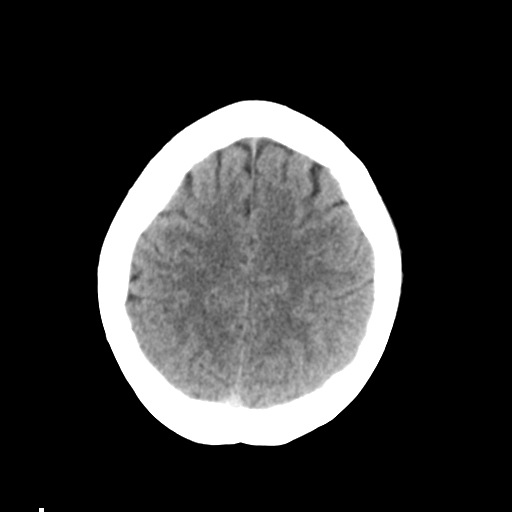
[im 23/28  brain]
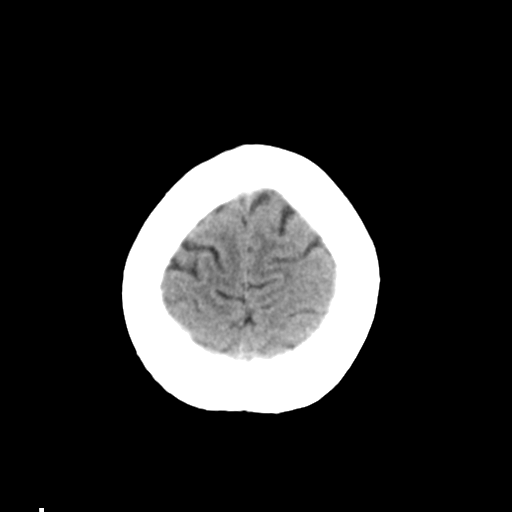
[im 26/28  brain]
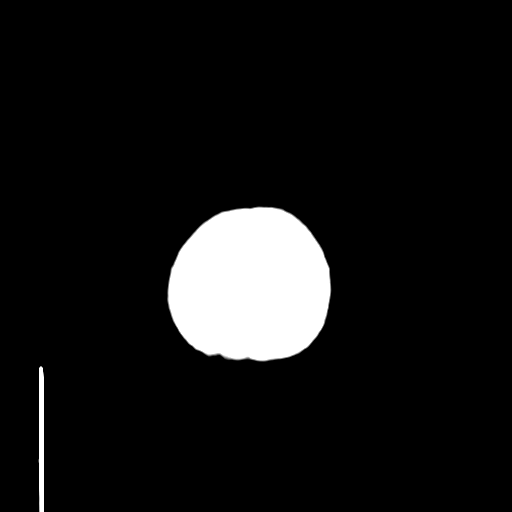
[im 26/28  bone]
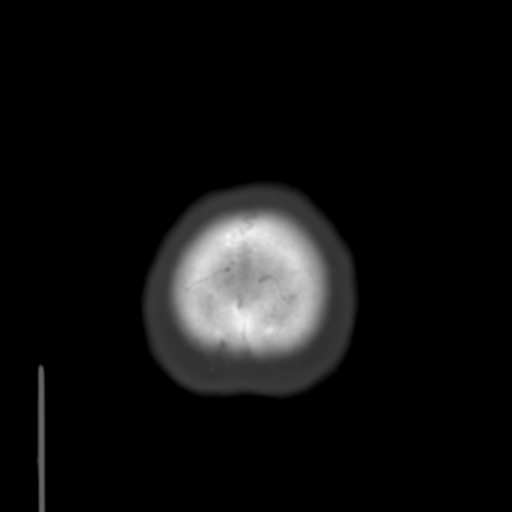

[15 of 45 positions shown; findings below may reference images not displayed]

FINDINGS: Brain: No acute infarct, hemorrhage, or mass lesion is present. The
ventricles are of normal size. No significant extraaxial fluid
collection is present. Pineal gland is calcified. Midline structures
are otherwise unremarkable.

Vascular: No hyperdense vessel or unexpected calcification.

Skull: Calvarium is intact. No focal lytic or blastic lesions are
present. No significant extracranial soft tissue lesion is present.

Sinuses/Orbits: The paranasal sinuses and mastoid air cells are
clear. The globes and orbits are within normal limits.

ASPECTS ([place] Stroke Program Early CT Score)

- Ganglionic level infarction (caudate, lentiform nuclei, internal
capsule, insula, M1-M3 cortex): [DATE]

- Supraganglionic infarction (M4-M6 cortex): [DATE]

Total score (0-10 with 10 being normal): [DATE]
IMPRESSION: 1. Normal CT appearance the brain
2. ASPECTS is [DATE]

These results were called by telephone at the time of interpretation
on 10/15/2019 at [DATE] to provider JOLIANN BEJAIA , who verbally
acknowledged these results.

## 2020-07-14 ENCOUNTER — Telehealth: Payer: Self-pay | Admitting: Internal Medicine

## 2020-07-14 NOTE — Telephone Encounter (Signed)
She needs to be seen, recommend UC or ER eval

## 2020-07-14 NOTE — Telephone Encounter (Signed)
Pt calling to schedule appt today at [facility] due to access nurse advise to be seen within 24 hours;. Pt said she has had rectal bleeding on and off for 3 - 4 days; 2 days ago large amt of bright red blood with constipated BM; yesterday not as much rectal bleeding; today pt said the rectal bleeding increased again with BM; also pt drinks 1 gallon of water daily and usually urinates at least 5 -6 times a day. Pt voided 1 -2 times on 07/13/20 and pt does not think she has voided today. pts abd is distended and pt feels abd bloating with some abd discomfort (not pain). No fever. Advised no available appts today at [facility] and pt does not want to go to ED unless absolutely necessary. Pt wanted to know if I could recommend an UC; advised [facility]; pt said she had been there before and was very pleased; I did advise pt if she needed imaging she would have to go to ED. Pt voiced understanding but will start at [place]. I asked pt to cb next wk with update on how she is doing.

## 2020-07-14 NOTE — Telephone Encounter (Signed)
[name] - Client TELEPHONE ADVICE RECORD AccessNurse Patient Name: Lacey Harrison Gender: Female DOB: November 13, 1985 Age: 34 Y 3 M 15 D Return Phone Number: [phone] (Primary) Address: City/State/Zip: [place] [address] Client [name] - Client Client Site [place] - Day Physician Webb Silversmith - NP Contact Type Call Who Is Calling Patient / Member / Family / Caregiver Call Type Triage / Clinical Relationship To Patient Self Return Phone Number [phone] (Primary) Chief Complaint URINATE - sudden inability to urinate Reason for Call Symptomatic / Request for [place] stated she has rectal bleeding, has been constipated for over 5 days, and has not been able to urinate since 8 am despite drinking water. Translation No Nurse Assessment Nurse: Thad Ranger, RN, Denise Date/Time (Eastern Time): 07/14/2020 2:26:47 PM Confirm and document reason for call. If symptomatic, describe symptoms. ---Caller stated she has rectal bleeding, has been constipated for over 5 days. Last bm was yesterday but the stool was hard. Does the patient have any new or worsening symptoms? ---Yes Will a triage be completed? ---Yes Related visit to physician within the last 2 weeks? ---No Does the PT have any chronic conditions? (i.e. diabetes, asthma, this includes High risk factors for pregnancy, etc.) ---Yes List chronic conditions. ---Lyme dx Is the patient pregnant or possibly pregnant? (Ask all females between the ages of 34-55) ---No Is this a behavioral health or substance abuse call? ---No Guidelines Guideline Title Affirmed Question Affirmed Notes Nurse Date/Time (Eastern Time) Rectal Bleeding MODERATE rectal bleeding (small blood clots, passing blood without stool, or toilet water turns red) Carmon, RN, Langley Lacey Harrison 07/14/2020 2:29:14 PM Disp. Time Eilene Ghazi Time) Disposition  Final User 07/14/2020 2:09:43 PM Send to Urgent Queue Cherlynn Perches 07/14/2020 2:12:38 PM Attempt made - no message left Chancy Hurter 07/14/2020 2:24:10 PM Send To RN Personal Derrel Nip, RN, Ralene Bathe NOTE: All timestamps contained within this report are represented as [place] Standard Time. CONFIDENTIALTY NOTICE: This fax transmission is intended only for the addressee. It contains information that is legally privileged, confidential or otherwise protected from use or disclosure. If you are not the intended recipient, you are strictly prohibited from reviewing, disclosing, copying using or disseminating any of this information or taking any action in reliance on or regarding this information. If you have received this fax in error, please notify us immediately by telephone so that we can arrange for its return to [place]. Phone: [phone], Toll-Free: [phone], Fax: [phone] Page: 2 of 2 Call Id: [id number] 07/14/2020 2:33:52 PM See PCP within 24 Hours Yes Carmon, RN, Lacey Harrison Disagree/Comply Comply Caller Understands Yes PreDisposition Call Doctor Care Advice Given Per Guideline SEE PCP WITHIN 24 HOURS: BRING MEDICINES: * Please bring a list of your current medicines when you go to see the doctor. CALL BACK IF: * Dizziness occurs * Bleeding increases * You become worse CARE ADVICE given per Rectal Bleeding (Adult) guideline. Referrals REFERRED TO PCP OFFICE

## 2020-07-14 NOTE — Telephone Encounter (Signed)
Patient is constipated for more than 5 days. Limited urination. Anal bleeding. Sent patient to Triage Nurse. EM 07/14/20

## 2020-07-16 NOTE — Telephone Encounter (Signed)
Recommend UC.

## 2020-11-21 ENCOUNTER — Encounter: Payer: Self-pay | Admitting: Adult Health

## 2020-11-21 ENCOUNTER — Ambulatory Visit: Payer: No Typology Code available for payment source | Admitting: Adult Health

## 2020-11-21 ENCOUNTER — Other Ambulatory Visit: Payer: Self-pay

## 2020-11-21 VITALS — BP 117/85 | HR 106 | Resp 16 | Ht 63.0 in | Wt 196.8 lb

## 2020-11-21 DIAGNOSIS — R599 Enlarged lymph nodes, unspecified: Secondary | ICD-10-CM

## 2020-11-21 DIAGNOSIS — R5383 Other fatigue: Secondary | ICD-10-CM

## 2020-11-21 DIAGNOSIS — E559 Vitamin D deficiency, unspecified: Secondary | ICD-10-CM | POA: Insufficient documentation

## 2020-11-21 DIAGNOSIS — H6090 Unspecified otitis externa, unspecified ear: Secondary | ICD-10-CM | POA: Insufficient documentation

## 2020-11-21 DIAGNOSIS — Z1389 Encounter for screening for other disorder: Secondary | ICD-10-CM | POA: Insufficient documentation

## 2020-11-21 DIAGNOSIS — J302 Other seasonal allergic rhinitis: Secondary | ICD-10-CM

## 2020-11-21 DIAGNOSIS — N926 Irregular menstruation, unspecified: Secondary | ICD-10-CM | POA: Diagnosis not present

## 2020-11-21 DIAGNOSIS — Z8742 Personal history of other diseases of the female genital tract: Secondary | ICD-10-CM

## 2020-11-21 DIAGNOSIS — H60392 Other infective otitis externa, left ear: Secondary | ICD-10-CM

## 2020-11-21 MED ORDER — NEOMYCIN-POLYMYXIN-HC 3.5-10000-1 OT SOLN: 3.0000 [drp] | Freq: Four times a day (QID) | OTIC | 0 refills | Status: DC

## 2020-11-21 MED ORDER — LEVOCETIRIZINE DIHYDROCHLORIDE 5 MG PO TABS: 5.0000 mg | ORAL_TABLET | Freq: Every evening | ORAL | 1 refills | Status: DC

## 2020-11-21 NOTE — Progress Notes (Signed)
New patient visit   Patient: Lacey Harrison   DOB: 24-Jun-1986   35 y.o. Female  MRN: [id number] Visit Date: 11/21/2020  Today's healthcare provider: Marcille Buffy, FNP   Chief Complaint  Patient presents with  . New Patient (Initial Visit)   Subjective    Lacey Harrison is a 35 y.o. female who presents today as a new patient to establish care.  HPI  Patient presents in office today to establish care she states that she feels fairly well today but does have concerns to address.   Patient states for the past 2 years she has had mass on the left side of her neck that she reports has grown larger in size, patient reports that she has concerns that this could be cancerous.   She has palpitations intermittent during the day and at night.  She has been eating poorly and does sometimes have some reflux.   Patient states that she has a past medical history of depression and anxiety and has been treated in past with medication but states that medication did not control her symptoms at the time.   Patient states that she has concerns that she has PCOS, she reports that its been more than five years since her last pap exam, she denies history of abnormal pap smears but states that she does have irregular menstrual cycles. Patient would like to discuss today referral to optometry, patient reports that she was recently diagnosed with blepharitis.   Patient  denies any fever, chills, rash, chest pain, shortness of breath, nausea, vomiting, or diarrhea.  Denies dizziness, lightheadedness, pre syncopal or syncopal episodes.    Past Medical History:  Diagnosis Date  . Allergy   . Anxiety   . Chicken pox   . Depression   . Heart murmur   . Lyme disease    patient reports a remote history of lyme and alludes that she has "chronic lyme"  . Migraines    MIGRAINES  . Pneumonia 07/2017   Past Surgical History:  Procedure Laterality Date  . CHROMOPERTUBATION  02/16/2018    Procedure: CHROMOPERTUBATION;  Surgeon: Schermerhorn, Gwen Her, MD;  Location: [facility];  Service: Gynecology;;  . CYSTECTOMY Left    OVARY  . DILATION AND CURETTAGE OF UTERUS  2008  . LAPAROSCOPIC LYSIS OF ADHESIONS N/A 02/16/2018   Procedure: EXTENSIVE LAPAROSCOPIC LYSIS OF ADHESIONS;  Surgeon: Schermerhorn, Gwen Her, MD;  Location: [facility];  Service: Gynecology;  Laterality: N/A;  . LAPAROSCOPY N/A 02/16/2018   Procedure: LAPAROSCOPY OPERATIVE;  Surgeon: Ouida Sills Gwen Her, MD;  Location: [facility];  Service: Gynecology;  Laterality: N/A;   Family Status  Relation Name Status  . Mother  Alive  . Father  Deceased  . MGM  Alive  . PGM  Deceased  . MGF  Deceased  . PGF  Deceased   Family History  Problem Relation Age of Onset  . Mental illness Mother   . Mental illness Father   . Mental illness Maternal Grandmother   . Mental illness Paternal Grandmother    Social History   Socioeconomic History  . Marital status: Married    Spouse name: Not on file  . Number of children: Not on file  . Years of education: Not on file  . Highest education level: Not on file  Occupational History  . Not on file  Tobacco Use  . Smoking status: Current Every Day Smoker    Packs/day: 0.75    Years: 14.00  Pack years: 10.50    Types: Cigarettes    Last attempt to quit: 07/05/2016    Years since quitting: 4.3  . Smokeless tobacco: Never Used  Vaping Use  . Vaping Use: Some days  Substance and Sexual Activity  . Alcohol use: Yes    Alcohol/week: 11.0 standard drinks    Types: 3 Glasses of wine, 2 Cans of beer, 3 Shots of liquor, 3 Standard drinks or equivalent per week    Comment: almost daily  . Drug use: No  . Sexual activity: Not on file  Other Topics Concern  . Not on file  Social History Narrative  . Not on file   Social Determinants of Health   Financial Resource Strain: Not on file  Food Insecurity: Not on file  Transportation Needs: Not on file  Physical Activity:  Not on file  Stress: Not on file  Social Connections: Not on file   Outpatient Medications Prior to Visit  Medication Sig  . ibuprofen (ADVIL) 200 MG tablet Take 400 mg by mouth as needed.   No facility-administered medications prior to visit.   Allergies  Allergen Reactions  . Tylenol [Acetaminophen] Other (See Comments)    Lingering Taste    Immunization History  Administered Date(s) Administered  . Influenza,inj,Quad PF,6+ Mos 07/01/2018  . Rabies, IM 03/09/2016, 03/13/2016, 03/16/2016  . Tdap 07/01/2018    Health Maintenance  Topic Date Due  . Hepatitis C Screening  Never done  . COVID-19 Vaccine (1) Never done  . HIV Screening  Never done  . PAP SMEAR-Modifier  03/06/2017  . INFLUENZA VACCINE  03/26/2020  . TETANUS/TDAP  07/01/2028  . HPV VACCINES  Aged Out    Patient Care Team: Devonte Migues, Kelby Aline, FNP as PCP - General (Family Medicine)  Review of Systems  Constitutional: Negative.   HENT: Negative.   Eyes: Negative.   Respiratory: Negative.   Cardiovascular: Positive for palpitations. Negative for chest pain and leg swelling.  Gastrointestinal: Positive for abdominal distention.  Genitourinary: Negative.   Musculoskeletal: Positive for arthralgias.  Allergic/Immunologic: Positive for environmental allergies.  Neurological: Positive for light-headedness. Negative for dizziness, tremors, seizures, syncope, facial asymmetry, speech difficulty, weakness, numbness and headaches.  Hematological: Positive for adenopathy.  Psychiatric/Behavioral: Positive for dysphoric mood and sleep disturbance. The patient is nervous/anxious.   All other systems reviewed and are negative.   Last CBC Lab Results  Component Value Date   WBC 7.5 10/15/2019   HGB 15.8 (H) 10/15/2019   HCT 46.0 10/15/2019   MCV 86.6 10/15/2019   MCH 29.8 10/15/2019   RDW 12.1 10/15/2019   PLT 295 30/86/5784   Last metabolic panel Lab Results  Component Value Date   GLUCOSE 108 (H)  10/15/2019   NA 138 10/15/2019   K 4.1 10/15/2019   CL 101 10/15/2019   CO2 27 10/15/2019   BUN 14 10/15/2019   CREATININE 0.67 10/15/2019   GFRNONAA >60 10/15/2019   GFRAA >60 10/15/2019   CALCIUM 9.4 10/15/2019   PROT 8.3 (H) 10/15/2019   ALBUMIN 4.8 10/15/2019   BILITOT 1.5 (H) 10/15/2019   ALKPHOS 44 10/15/2019   AST 21 10/15/2019   ALT 36 10/15/2019   ANIONGAP 10 10/15/2019      Objective    BP 117/85   Pulse (!) 106   Resp 16   Ht 5\' 3"  (1.6 m)   Wt 196 lb 12.8 oz (89.3 kg)   LMP  (Within Months)   SpO2 99%   BMI 34.86  kg/m  Physical Exam Vitals reviewed.  Constitutional:      General: She is not in acute distress.    Appearance: She is well-developed. She is not diaphoretic.     Interventions: She is not intubated. HENT:     Head: Normocephalic and atraumatic.     Right Ear: External ear normal. A middle ear effusion is present.     Left Ear: External ear normal. A middle ear effusion is present.     Nose: Nose normal.     Right Turbinates: Swollen and pale.     Left Turbinates: Swollen and pale.     Mouth/Throat:     Lips: Pink.     Mouth: Mucous membranes are moist.     Pharynx: Oropharynx is clear. Uvula midline. No oropharyngeal exudate.     Comments: Clear post nasal drip/  Eyes:     General: Lids are normal. No scleral icterus.       Right eye: No discharge.        Left eye: No discharge.     Conjunctiva/sclera: Conjunctivae normal.     Right eye: Right conjunctiva is not injected. No exudate or hemorrhage.    Left eye: Left conjunctiva is not injected. No exudate or hemorrhage.    Pupils: Pupils are equal, round, and reactive to light.  Neck:     Thyroid: Thyroid tenderness present. No thyroid mass or thyromegaly.     Vascular: Normal carotid pulses. No carotid bruit, hepatojugular reflux or JVD.     Trachea: Trachea and phonation normal. No tracheal tenderness or tracheal deviation.     Meningeal: Brudzinski's sign and Kernig's sign absent.   Cardiovascular:     Rate and Rhythm: Normal rate and regular rhythm.     Pulses: Normal pulses.          Radial pulses are 2+ on the right side and 2+ on the left side.       Dorsalis pedis pulses are 2+ on the right side and 2+ on the left side.       Posterior tibial pulses are 2+ on the right side and 2+ on the left side.     Heart sounds: Normal heart sounds, S1 normal and S2 normal. Heart sounds not distant. No murmur heard. No friction rub. No gallop.   Pulmonary:     Effort: Pulmonary effort is normal. No tachypnea, bradypnea, accessory muscle usage or respiratory distress. She is not intubated.     Breath sounds: Normal breath sounds. No stridor. No wheezing or rales.  Chest:     Chest wall: No tenderness.  Breasts:     Right: No supraclavicular adenopathy.     Left: No supraclavicular adenopathy.    Abdominal:     General: Bowel sounds are normal. There is no distension or abdominal bruit.     Palpations: Abdomen is soft. There is no shifting dullness, fluid wave, hepatomegaly, splenomegaly, mass or pulsatile mass.     Tenderness: There is no abdominal tenderness. There is no guarding or rebound.     Hernia: No hernia is present.  Musculoskeletal:        General: No tenderness or deformity. Normal range of motion.     Cervical back: Full passive range of motion without pain, normal range of motion and neck supple. No edema, erythema or rigidity. No spinous process tenderness or muscular tenderness. Normal range of motion.  Lymphadenopathy:     Head:     Right side of head:  No submental, submandibular, tonsillar, preauricular, posterior auricular or occipital adenopathy.     Left side of head: No submental, submandibular, tonsillar, preauricular, posterior auricular or occipital adenopathy.     Cervical: Cervical adenopathy present.     Right cervical: No superficial, deep or posterior cervical adenopathy.    Left cervical: Superficial cervical adenopathy present. No deep or  posterior cervical adenopathy.     Upper Body:     Right upper body: No supraclavicular or pectoral adenopathy.     Left upper body: No supraclavicular or pectoral adenopathy.  Skin:    General: Skin is warm and dry.     Coloration: Skin is not pale.     Findings: No abrasion, bruising, burn, ecchymosis, erythema, lesion, petechiae or rash.     Nails: There is no clubbing.  Neurological:     Mental Status: She is alert and oriented to person, place, and time.     GCS: GCS eye subscore is 4. GCS verbal subscore is 5. GCS motor subscore is 6.     Cranial Nerves: No cranial nerve deficit.     Sensory: No sensory deficit.     Motor: No tremor, atrophy, abnormal muscle tone or seizure activity.     Coordination: Coordination normal.     Gait: Gait normal.     Deep Tendon Reflexes: Reflexes are normal and symmetric. Reflexes normal. Babinski sign absent on the right side. Babinski sign absent on the left side.     Reflex Scores:      Tricep reflexes are 2+ on the right side and 2+ on the left side.      Bicep reflexes are 2+ on the right side and 2+ on the left side.      Brachioradialis reflexes are 2+ on the right side and 2+ on the left side.      Patellar reflexes are 2+ on the right side and 2+ on the left side.      Achilles reflexes are 2+ on the right side and 2+ on the left side. Psychiatric:        Speech: Speech normal.        Behavior: Behavior normal.        Thought Content: Thought content normal.        Judgment: Judgment normal.     Depression Screen PHQ 2/9 Scores 11/21/2020 02/02/2019  PHQ - 2 Score 5 1  PHQ- 9 Score 20 -   No results found for any visits on 11/21/20.  Assessment & Plan     Fatigue, unspecified type - Plan: CBC with Differential/Platelet, Comprehensive metabolic panel, Lipid panel, TSH  Screening for blood or protein in urine - Plan: POCT urinalysis dipstick  Vitamin D deficiency - Plan: VITAMIN D 25 Hydroxy (Vit-D Deficiency,  Fractures)  Menstrual irregularity - Plan: Ambulatory referral to Obstetrics / Gynecology, B-HCG Quant  History of ovarian cyst - Plan: Ambulatory referral to Obstetrics / Gynecology  Seasonal allergies - Plan: levocetirizine (XYZAL) 5 MG tablet  Other infective acute otitis externa of left ear - Plan: POCT urinalysis dipstick  Swollen lymph nodes - Plan: US THYROID, US Soft Tissue Head/Neck (NON-THYROID)   Orders Placed This Encounter  Procedures  . US THYROID    Order Specific Question:   Reason for Exam (SYMPTOM  OR DIAGNOSIS REQUIRED)    Answer:   neck tender to palpation question left sided thyroid nodule    Order Specific Question:   Preferred imaging location?    Answer:  [facility]  . US Soft Tissue Head/Neck (NON-THYROID)    Order Specific Question:   Reason for Exam (SYMPTOM  OR DIAGNOSIS REQUIRED)    Answer:   lymph node swelling left side of neck for over one year.    Order Specific Question:   Preferred imaging location?    Answer:   [facility]  . CBC with Differential/Platelet  . Comprehensive metabolic panel  . Lipid panel  . TSH  . VITAMIN D 25 Hydroxy (Vit-D Deficiency, Fractures)  . B-HCG Quant  . Ambulatory referral to Obstetrics / Gynecology    Referral Priority:   Routine    Referral Type:   Consultation    Referral Reason:   Specialty Services Required    Referred to Provider:   Rubie Maid, MD    Requested Specialty:   Obstetrics and Gynecology    Number of Visits Requested:   1  . POCT urinalysis dipstick  . POCT urinalysis dipstick   Meds ordered this encounter  Medications  . levocetirizine (XYZAL) 5 MG tablet    Sig: Take 1 tablet (5 mg total) by mouth every evening.    Dispense:  90 tablet    Refill:  1  . neomycin-polymyxin-hydrocortisone (CORTISPORIN) OTIC solution    Sig: Place 3 drops into the left ear 4 (four) times daily.    Dispense:  10 mL    Refill:  0   Start allergy medications as above.  Will ultrasound soft  tissue of neck and thyroid.  She will go for fasting labs.  Rule out thyroid abnormality.   Return in about 2 weeks (around 12/05/2020), or if symptoms worsen or fail to improve, for at any time for any worsening symptoms, Go to Emergency room/ urgent care if worse.    The entirety of the information documented in the History of Present Illness, Review of Systems and Physical Exam were personally obtained by me. Portions of this information were initially documented by the CMA and reviewed by me for thoroughness and accuracy.   Red Flags discussed. The patient was given clear instructions to go to ER or return to medical center if any red flags develop, symptoms do not improve, worsen or new problems develop. They verbalized understanding.  Marcille Buffy, [place] [phone] (phone) [phone] (fax)  [place]

## 2020-11-21 NOTE — Patient Instructions (Addendum)
Health Maintenance, Female Adopting a healthy lifestyle and getting preventive care are important in promoting health and wellness. Ask your health care provider about:  The right schedule for you to have regular tests and exams.  Things you can do on your own to prevent diseases and keep yourself healthy. What should I know about diet, weight, and exercise? Eat a healthy diet  Eat a diet that includes plenty of vegetables, fruits, low-fat dairy products, and lean protein.  Do not eat a lot of foods that are high in solid fats, added sugars, or sodium.   Maintain a healthy weight Body mass index (BMI) is used to identify weight problems. It estimates body fat based on height and weight. Your health care provider can help determine your BMI and help you achieve or maintain a healthy weight. Get regular exercise Get regular exercise. This is one of the most important things you can do for your health. Most adults should:  Exercise for at least 150 minutes each week. The exercise should increase your heart rate and make you sweat (moderate-intensity exercise).  Do strengthening exercises at least twice a week. This is in addition to the moderate-intensity exercise.  Spend less time sitting. Even light physical activity can be beneficial. Watch cholesterol and blood lipids Have your blood tested for lipids and cholesterol at 35 years of age, then have this test every 5 years. Have your cholesterol levels checked more often if:  Your lipid or cholesterol levels are high.  You are older than 35 years of age.  You are at high risk for heart disease. What should I know about cancer screening? Depending on your health history and family history, you may need to have cancer screening at various ages. This may include screening for:  Breast cancer.  Cervical cancer.  Colorectal cancer.  Skin cancer.  Lung cancer. What should I know about heart disease, diabetes, and high blood  pressure? Blood pressure and heart disease  High blood pressure causes heart disease and increases the risk of stroke. This is more likely to develop in people who have high blood pressure readings, are of African descent, or are overweight.  Have your blood pressure checked: ? Every 3-5 years if you are 18-39 years of age. ? Every year if you are 40 years old or older. Diabetes Have regular diabetes screenings. This checks your fasting blood sugar level. Have the screening done:  Once every three years after age 40 if you are at a normal weight and have a low risk for diabetes.  More often and at a younger age if you are overweight or have a high risk for diabetes. What should I know about preventing infection? Hepatitis B If you have a higher risk for hepatitis B, you should be screened for this virus. Talk with your health care provider to find out if you are at risk for hepatitis B infection. Hepatitis C Testing is recommended for:  Everyone born from 1945 through 1965.  Anyone with known risk factors for hepatitis C. Sexually transmitted infections (STIs)  Get screened for STIs, including gonorrhea and chlamydia, if: ? You are sexually active and are younger than 35 years of age. ? You are older than 35 years of age and your health care provider tells you that you are at risk for this type of infection. ? Your sexual activity has changed since you were last screened, and you are at increased risk for chlamydia or gonorrhea. Ask your health care provider   if you are at risk.  Ask your health care provider about whether you are at high risk for HIV. Your health care provider may recommend a prescription medicine to help prevent HIV infection. If you choose to take medicine to prevent HIV, you should first get tested for HIV. You should then be tested every 3 months for as long as you are taking the medicine. Pregnancy  If you are about to stop having your period (premenopausal) and  you may become pregnant, seek counseling before you get pregnant.  Take 400 to 800 micrograms (mcg) of folic acid every day if you become pregnant.  Ask for birth control (contraception) if you want to prevent pregnancy. Osteoporosis and menopause Osteoporosis is a disease in which the bones lose minerals and strength with aging. This can result in bone fractures. If you are 18 years old or older, or if you are at risk for osteoporosis and fractures, ask your health care provider if you should:  Be screened for bone loss.  Take a calcium or vitamin D supplement to lower your risk of fractures.  Be given hormone replacement therapy (HRT) to treat symptoms of menopause. Follow these instructions at home: Lifestyle  Do not use any products that contain nicotine or tobacco, such as cigarettes, e-cigarettes, and chewing tobacco. If you need help quitting, ask your health care provider.  Do not use street drugs.  Do not share needles.  Ask your health care provider for help if you need support or information about quitting drugs. Alcohol use  Do not drink alcohol if: ? Your health care provider tells you not to drink. ? You are pregnant, may be pregnant, or are planning to become pregnant.  If you drink alcohol: ? Limit how much you use to 0-1 drink a day. ? Limit intake if you are breastfeeding.  Be aware of how much alcohol is in your drink. In the [place] one drink equals one 12 oz bottle of beer (355 mL), one 5 oz glass of wine (148 mL), or one 1 oz glass of hard liquor (44 mL). General instructions  Schedule regular health, dental, and eye exams.  Stay current with your vaccines.  Tell your health care provider if: ? You often feel depressed. ? You have ever been abused or do not feel safe at home. Summary  Adopting a healthy lifestyle and getting preventive care are important in promoting health and wellness.  Follow your health care provider's instructions about healthy  diet, exercising, and getting tested or screened for diseases.  Follow your health care provider's instructions on monitoring your cholesterol and blood pressure. This information is not intended to replace advice given to you by your health care provider. Make sure you discuss any questions you have with your health care provider. Document Revised: 08/05/2018 Document Reviewed: 08/05/2018 Elsevier Patient Education  2021 [place].    Allergies, Adult An allergy means that your body reacts to something that bothers it (allergen). This can happen from something that you eat, breathe in, or touch. Allergies often affect the nose, eyes, skin, and stomach. They can be mild, moderate, or very bad (severe). An allergy cannot spread from person to person. They can happen at any age. Sometimes, people outgrow them. What are the causes?  Outdoor things, such as pollen, car fumes, and mold.  Indoor things, such as dust, smoke, mold, and pets.  Foods.  Medicines.  Things that bother your skin, such as perfume and bug bites. What increases  the risk?  Having family members with allergies or asthma. What are the signs or symptoms? Symptoms depend on how bad your allergy is. Mild to moderate symptoms  Runny nose, stuffy nose, or sneezing.  Itchy mouth, ears, or throat.  A feeling of mucus dripping down the back of your throat.  Sore throat.  Eyes that are itchy, red, watery, or puffy.  A skin rash, or red, swollen areas of skin (hives).  Stomach cramps or bloating. Severe symptoms Very bad allergies to food, medicine, or bug bites may cause a very bad allergy reaction (anaphylaxis). This can be life-threatening. Symptoms include:  A red face.  Wheezing or coughing.  Swollen lips, tongue, or mouth.  Tight or swollen throat.  Chest pain or tightness, or a fast heartbeat.  Trouble breathing or shortness of breath.  Pain in your belly (abdomen), vomiting, or watery poop  (diarrhea).  Feeling dizzy or fainting. How is this treated? Treatment for this condition depends on your symptoms. Treatment may include:  Cold, wet cloths for itching and swelling.  Eye drops, nose sprays, or skin creams.  Washing out your nose each day.  A humidifier.  Medicines.  A change to the foods you eat.  Being exposed again and again to tiny amounts of allergens. This helps your body get used to them. You might have: ? Allergy shots. ? Very small amounts of allergen put under your tongue.  An emergency shot (auto-injector pen) if you have a very bad allergy reaction. ? This is a medicine with a needle. You can put it into your skin by yourself. ? Your doctor will teach you how to use it.      Follow these instructions at home: Medicines  Take or apply over-the-counter and prescription medicines only as told by your doctor.  If you are at risk for a very bad allergy reaction, keep an auto-injector pen with you all the time.   Eating and drinking  Follow instructions from your doctor about what to eat and drink.  Drink enough fluid to keep your pee (urine) pale yellow. General instructions  If you have ever had a very bad allergy reaction, wear a medical alert bracelet or necklace.  Stay away from things that you are allergic to.  Keep all follow-up visits as told by your doctor. This is important. Contact a doctor if:  Your symptoms do not get better with treatment. Get help right away if:  You have symptoms of a very bad allergy reaction. These include: ? A swollen mouth, tongue, or throat. ? Pain or tightness in your chest. ? Trouble breathing. ? Being short of breath. ? Dizziness. ? Fainting. ? Very bad pain in your belly. ? Vomiting. ? Watery poop. These symptoms may be an emergency. Do not wait to see if the symptoms will go away. Get medical help right away. Call your local emergency services (911 in the [place] Do not drive yourself to the  hospital. Summary  Take or apply over-the-counter and prescription medicines only as told by your doctor.  Stay away from things you are allergic to.  If you are at risk for a very bad allergy reaction, carry an auto-injector pen all the time.  Wear a medical alert bracelet or necklace.  Very bad allergy reactions can be life-threatening. Get help right away. This information is not intended to replace advice given to you by your health care provider. Make sure you discuss any questions you have with your health care  provider. Document Revised: 06/23/2019 Document Reviewed: 06/23/2019 Elsevier Patient Education  2021 [employer].

## 2020-11-22 ENCOUNTER — Telehealth: Payer: Self-pay

## 2020-11-22 ENCOUNTER — Other Ambulatory Visit: Payer: Self-pay | Admitting: Adult Health

## 2020-11-22 DIAGNOSIS — J0140 Acute pansinusitis, unspecified: Secondary | ICD-10-CM

## 2020-11-22 DIAGNOSIS — R599 Enlarged lymph nodes, unspecified: Secondary | ICD-10-CM

## 2020-11-22 MED ORDER — AMOXICILLIN-POT CLAVULANATE 875-125 MG PO TABS: 1.0000 | ORAL_TABLET | Freq: Two times a day (BID) | ORAL | 0 refills | Status: DC

## 2020-11-22 NOTE — Telephone Encounter (Signed)
Please advise if appropriate. KW

## 2020-11-22 NOTE — Telephone Encounter (Signed)
Copied from [place] [id number]. Topic: General - Other >> Nov 22, 2020  1:53 PM Keene Breath wrote: Reason for CRM: Patient called to ask Dr. Claudina Lick if she could call in some medication for a sinus infection she believes she has.  Stated that she saw her yesterday and forgot to ask for some meds.  Please advise and call patient to discuss at [phone]

## 2020-11-22 NOTE — Progress Notes (Signed)
Meds ordered this encounter  Medications  . amoxicillin-clavulanate (AUGMENTIN) 875-125 MG tablet    Sig: Take 1 tablet by mouth 2 (two) times daily.    Dispense:  20 tablet    Refill:  0   Adenopathy - Plan: amoxicillin-clavulanate (AUGMENTIN) 875-125 MG tablet  Acute non-recurrent pansinusitis - Plan: amoxicillin-clavulanate (AUGMENTIN) 875-125 MG tablet

## 2020-11-22 NOTE — Telephone Encounter (Signed)
Meds ordered this encounter  Medications   amoxicillin-clavulanate (AUGMENTIN) 875-125 MG tablet    Sig: Take 1 tablet by mouth 2 (two) times daily.    Dispense:  20 tablet    Refill:  0   For adenopathy she had in office will send as above.

## 2020-11-23 ENCOUNTER — Other Ambulatory Visit: Payer: Self-pay | Admitting: Adult Health

## 2020-11-23 DIAGNOSIS — E559 Vitamin D deficiency, unspecified: Secondary | ICD-10-CM

## 2020-11-23 LAB — CBC WITH DIFFERENTIAL/PLATELET
Basophils Absolute: 0.1 10*3/uL (ref 0.0–0.2)
Basos: 1 %
EOS (ABSOLUTE): 0.2 10*3/uL (ref 0.0–0.4)
Eos: 2 %
Hematocrit: 45.1 % (ref 34.0–46.6)
Hemoglobin: 15.7 g/dL (ref 11.1–15.9)
Immature Grans (Abs): 0.2 10*3/uL — ABNORMAL HIGH (ref 0.0–0.1)
Immature Granulocytes: 2 %
Lymphocytes Absolute: 2.2 10*3/uL (ref 0.7–3.1)
Lymphs: 23 %
MCH: 30.4 pg (ref 26.6–33.0)
MCHC: 34.8 g/dL (ref 31.5–35.7)
MCV: 87 fL (ref 79–97)
Monocytes Absolute: 0.5 10*3/uL (ref 0.1–0.9)
Monocytes: 6 %
Neutrophils Absolute: 6.4 10*3/uL (ref 1.4–7.0)
Neutrophils: 66 %
Platelets: 259 10*3/uL (ref 150–450)
RBC: 5.17 x10E6/uL (ref 3.77–5.28)
RDW: 12.9 % (ref 11.7–15.4)
WBC: 9.6 10*3/uL (ref 3.4–10.8)

## 2020-11-23 LAB — COMPREHENSIVE METABOLIC PANEL
ALT: 58 IU/L — ABNORMAL HIGH (ref 0–32)
AST: 36 IU/L (ref 0–40)
Albumin/Globulin Ratio: 2 (ref 1.2–2.2)
Albumin: 4.7 g/dL (ref 3.8–4.8)
Alkaline Phosphatase: 59 IU/L (ref 44–121)
BUN/Creatinine Ratio: 16 (ref 9–23)
BUN: 9 mg/dL (ref 6–20)
Bilirubin Total: 0.5 mg/dL (ref 0.0–1.2)
CO2: 23 mmol/L (ref 20–29)
Calcium: 9.3 mg/dL (ref 8.7–10.2)
Chloride: 103 mmol/L (ref 96–106)
Creatinine, Ser: 0.57 mg/dL (ref 0.57–1.00)
Globulin, Total: 2.3 g/dL (ref 1.5–4.5)
Glucose: 83 mg/dL (ref 65–99)
Potassium: 4.4 mmol/L (ref 3.5–5.2)
Sodium: 143 mmol/L (ref 134–144)
Total Protein: 7 g/dL (ref 6.0–8.5)
eGFR: 122 mL/min/{1.73_m2} (ref >59)

## 2020-11-23 LAB — LIPID PANEL
Chol/HDL Ratio: 6.3 ratio — ABNORMAL HIGH (ref 0.0–4.4)
Cholesterol, Total: 253 mg/dL — ABNORMAL HIGH (ref 100–199)
HDL: 40 mg/dL (ref >39)
LDL Chol Calc (NIH): 153 mg/dL — ABNORMAL HIGH (ref 0–99)
Triglycerides: 320 mg/dL — ABNORMAL HIGH (ref 0–149)
VLDL Cholesterol Cal: 60 mg/dL — ABNORMAL HIGH (ref 5–40)

## 2020-11-23 LAB — TSH: TSH: 1.64 u[IU]/mL (ref 0.450–4.500)

## 2020-11-23 LAB — VITAMIN D 25 HYDROXY (VIT D DEFICIENCY, FRACTURES): Vit D, 25-Hydroxy: 15.8 ng/mL — ABNORMAL LOW (ref 30.0–100.0)

## 2020-11-23 MED ORDER — VITAMIN D (ERGOCALCIFEROL) 1.25 MG (50000 UNIT) PO CAPS: 50000.0000 [IU] | ORAL_CAPSULE | ORAL | 0 refills | Status: DC

## 2020-11-23 NOTE — Progress Notes (Signed)
Meds ordered this encounter  Medications  . Vitamin D, Ergocalciferol, (DRISDOL) 1.25 MG (50000 UNIT) CAPS capsule    Sig: Take 1 capsule (50,000 Units total) by mouth every 7 (seven) days. (taking one tablet per week) walk in lab in office 1-2 weeks after completing prescription.    Dispense:  12 capsule    Refill:  0

## 2020-11-23 NOTE — Progress Notes (Signed)
CBC ok immature absolute granulocytes mildy elevated, likely clinically insignificant, would like to recheck CBC in one month.   CMP ok, ALT is elevated liver enzyme, avoid alcohol and tylenol when able.   If fasting when labs were done cholesterol is elevated, Total cholesterol,triglycerides  and LDL elevated.  Discuss lifestyle modification with patient e.g. increase exercise, fiber, fruits, vegetables, lean meat, and omega 3/fish intake and decrease saturated fat.  If patient following strict diet and exercise program already please schedule follow up appointment with primary care physician  Vitamin  D is low, this can contribute to poor sleep ,seasonal affective disorder and fatigue, will send in prescription for Vitamin D at 50,000 units by mouth once every 7 days/(once weekly) for 12 weeks. Advise recheck lab Vitamin D in 1-2 weeks after completing vitamin d prescription. Lab iis walk in and is closed during lunch during regular office hours.  Recheck lipid panel, vitamin D in 3 months. Recheck CBC in one month. Please add labs.

## 2020-12-05 ENCOUNTER — Other Ambulatory Visit: Payer: Self-pay

## 2020-12-05 ENCOUNTER — Encounter: Payer: Self-pay | Admitting: Adult Health

## 2020-12-05 ENCOUNTER — Ambulatory Visit (INDEPENDENT_AMBULATORY_CARE_PROVIDER_SITE_OTHER): Payer: Self-pay | Admitting: Adult Health

## 2020-12-05 VITALS — BP 111/78 | HR 80 | Resp 16 | Wt 194.8 lb

## 2020-12-05 DIAGNOSIS — F32A Depression, unspecified: Secondary | ICD-10-CM | POA: Diagnosis not present

## 2020-12-05 DIAGNOSIS — R5383 Other fatigue: Secondary | ICD-10-CM | POA: Diagnosis not present

## 2020-12-05 DIAGNOSIS — E559 Vitamin D deficiency, unspecified: Secondary | ICD-10-CM | POA: Diagnosis not present

## 2020-12-05 DIAGNOSIS — R7401 Elevation of levels of liver transaminase levels: Secondary | ICD-10-CM | POA: Diagnosis not present

## 2020-12-05 NOTE — Patient Instructions (Signed)
Vitamin D Deficiency Vitamin D deficiency is when your body does not have enough vitamin D. Vitamin D is important to your body because:  It helps your body use other minerals.  It helps to keep your bones strong and healthy.  It may help to prevent some diseases.  It helps your heart and other muscles work well. Not getting enough vitamin D can make your bones soft. It can also cause other health problems. What are the causes? This condition may be caused by:  Not eating enough foods that contain vitamin D.  Not getting enough sun.  Having diseases that make it hard for your body to absorb vitamin D.  Having a surgery in which a part of the stomach or a part of the small intestine is removed.  Having kidney disease or liver disease. What increases the risk? You are more likely to get this condition if:  You are older.  You do not spend much time outdoors.  You live in a nursing home.  You have had broken bones.  You have weak or thin bones (osteoporosis).  You have a disease or condition that changes how your body absorbs vitamin D.  You have dark skin.  You take certain medicines.  You are overweight or obese. What are the signs or symptoms?  In mild cases, there may not be any symptoms. If the condition is very bad, symptoms may include: ? Bone pain. ? Muscle pain. ? Falling often. ? Broken bones caused by a minor injury. How is this treated? Treatment may include taking supplements as told by your doctor. Your doctor will tell you what dose is best for you. Supplements may include:  Vitamin D.  Calcium. Follow these instructions at home: Eating and drinking  Eat foods that contain vitamin D, such as: ? Dairy products, cereals, or juices with added vitamin D. Check the label. ? Fish, such as salmon or trout. ? Eggs. ? Oysters. ? Mushrooms. The items listed above may not be a complete list of what you can eat and drink. Contact a dietitian for more  options.   General instructions  Take medicines and supplements only as told by your doctor.  Get regular, safe exposure to natural sunlight.  Do not use a tanning bed.  Maintain a healthy weight. Lose weight if needed.  Keep all follow-up visits as told by your doctor. This is important. How is this prevented?  You can get vitamin D by: ? Eating foods that naturally contain vitamin D. ? Eating or drinking products that have vitamin D added to them, such as cereals, juices, and milk. ? Taking vitamin D or a multivitamin that contains vitamin D. ? Being in the sun. Your body makes vitamin D when your skin is exposed to sunlight. Your body changes the sunlight into a form of the vitamin that it can use. Contact a doctor if:  Your symptoms do not go away.  You feel sick to your stomach (nauseous).  You throw up (vomit).  You poop less often than normal, or you have trouble pooping (constipation). Summary  Vitamin D deficiency is when your body does not have enough vitamin D.  Vitamin D helps to keep your bones strong and healthy.  This condition is often treated by taking a supplement.  Your doctor will tell you what dose is best for you. This information is not intended to replace advice given to you by your health care provider. Make sure you discuss any questions   you have with your health care provider. Document Revised: 04/20/2018 Document Reviewed: 04/20/2018 Elsevier Patient Education  2021 [employer]  

## 2020-12-05 NOTE — Progress Notes (Signed)
Established patient visit   Patient: Lacey Harrison   DOB: [birth year]   35 y.o. Female  MRN: [id number] Visit Date: 12/05/2020  Today's healthcare provider: Marcille Buffy, FNP   Chief Complaint  Patient presents with  . Follow-up   Subjective    HPI  Follow up for allergies   The patient was last seen for this 2 weeks ago. Changes made at last visit include patient was started on Xyzal and Cortisporin otic solutuion.  She reports excellent compliance with treatment. She feels that condition is Improved. She is not having side effects.   Patient  denies any fever, body aches,chills, rash, chest pain, shortness of breath, nausea, vomiting, or diarrhea.  Denies dizziness, lightheadedness, pre syncopal or syncopal episodes.   -----------------------------------------------------------------------------------------      Medications: Outpatient Medications Prior to Visit  Medication Sig  . ibuprofen (ADVIL) 200 MG tablet Take 400 mg by mouth as needed.  Marland Kitchen levocetirizine (XYZAL) 5 MG tablet Take 1 tablet (5 mg total) by mouth every evening.  . neomycin-polymyxin-hydrocortisone (CORTISPORIN) OTIC solution Place 3 drops into the left ear 4 (four) times daily.  . Vitamin D, Ergocalciferol, (DRISDOL) 1.25 MG (50000 UNIT) CAPS capsule Take 1 capsule (50,000 Units total) by mouth every 7 (seven) days. (taking one tablet per week) walk in lab in office 1-2 weeks after completing prescription.  . [DISCONTINUED] amoxicillin-clavulanate (AUGMENTIN) 875-125 MG tablet Take 1 tablet by mouth 2 (two) times daily.   No facility-administered medications prior to visit.    Review of Systems  Constitutional: Negative.   HENT: Positive for congestion.   Respiratory: Negative.   Cardiovascular: Negative.   Neurological: Negative.  Negative for facial asymmetry.  Hematological: Negative.        Objective    BP 111/78   Pulse 80   Resp 16   Wt 194 lb 12.8 oz (88.4 kg)    SpO2 99%   BMI 34.51 kg/m  BP Readings from Last 3 Encounters:  12/05/20 111/78  11/21/20 117/85  10/15/19 105/69   Wt Readings from Last 3 Encounters:  12/05/20 194 lb 12.8 oz (88.4 kg)  11/21/20 196 lb 12.8 oz (89.3 kg)  10/15/19 163 lb (73.9 kg)       Physical Exam Vitals reviewed.  Constitutional:      General: She is not in acute distress.    Appearance: She is well-developed. She is not diaphoretic.     Interventions: She is not intubated. HENT:     Head: Normocephalic and atraumatic.     Right Ear: External ear normal.     Left Ear: External ear normal.     Nose: Nose normal.     Mouth/Throat:     Pharynx: No oropharyngeal exudate.  Eyes:     General: Lids are normal. No scleral icterus.       Right eye: No discharge.        Left eye: No discharge.     Conjunctiva/sclera: Conjunctivae normal.     Right eye: Right conjunctiva is not injected. No exudate or hemorrhage.    Left eye: Left conjunctiva is not injected. No exudate or hemorrhage.    Pupils: Pupils are equal, round, and reactive to light.  Neck:     Thyroid: No thyroid mass or thyromegaly.     Vascular: Normal carotid pulses. No carotid bruit, hepatojugular reflux or JVD.     Trachea: Trachea and phonation normal. No tracheal tenderness or tracheal deviation.  Meningeal: Brudzinski's sign and Kernig's sign absent.  Cardiovascular:     Rate and Rhythm: Normal rate and regular rhythm.     Pulses: Normal pulses.          Radial pulses are 2+ on the right side and 2+ on the left side.       Dorsalis pedis pulses are 2+ on the right side and 2+ on the left side.       Posterior tibial pulses are 2+ on the right side and 2+ on the left side.     Heart sounds: Normal heart sounds, S1 normal and S2 normal. Heart sounds not distant. No murmur heard. No friction rub. No gallop.   Pulmonary:     Effort: Pulmonary effort is normal. No tachypnea, bradypnea, accessory muscle usage or respiratory distress. She  is not intubated.     Breath sounds: Normal breath sounds. No stridor. No wheezing or rales.  Chest:     Chest wall: No tenderness.  Breasts:     Right: No supraclavicular adenopathy.     Left: No supraclavicular adenopathy.    Abdominal:     General: Bowel sounds are normal. There is no distension or abdominal bruit.     Palpations: Abdomen is soft. There is no shifting dullness, fluid wave, hepatomegaly, splenomegaly, mass or pulsatile mass.     Tenderness: There is no abdominal tenderness. There is no guarding or rebound.     Hernia: No hernia is present.  Musculoskeletal:        General: No tenderness or deformity. Normal range of motion.     Cervical back: Full passive range of motion without pain, normal range of motion and neck supple. No edema, erythema or rigidity. No spinous process tenderness or muscular tenderness. Normal range of motion.  Lymphadenopathy:     Head:     Right side of head: No submental, submandibular, tonsillar, preauricular, posterior auricular or occipital adenopathy.     Left side of head: No submental, submandibular, tonsillar, preauricular, posterior auricular or occipital adenopathy.     Cervical: No cervical adenopathy.     Right cervical: No superficial, deep or posterior cervical adenopathy.    Left cervical: No superficial, deep or posterior cervical adenopathy.     Upper Body:     Right upper body: No supraclavicular or pectoral adenopathy.     Left upper body: No supraclavicular or pectoral adenopathy.  Skin:    General: Skin is warm and dry.     Coloration: Skin is not pale.     Findings: No abrasion, bruising, burn, ecchymosis, erythema, lesion, petechiae or rash.     Nails: There is no clubbing.  Neurological:     Mental Status: She is alert and oriented to person, place, and time.     GCS: GCS eye subscore is 4. GCS verbal subscore is 5. GCS motor subscore is 6.     Cranial Nerves: No cranial nerve deficit.     Sensory: No sensory  deficit.     Motor: No tremor, atrophy, abnormal muscle tone or seizure activity.     Coordination: Coordination normal.     Gait: Gait normal.     Deep Tendon Reflexes: Reflexes are normal and symmetric. Reflexes normal. Babinski sign absent on the right side. Babinski sign absent on the left side.     Reflex Scores:      Tricep reflexes are 2+ on the right side and 2+ on the left side.  Bicep reflexes are 2+ on the right side and 2+ on the left side.      Brachioradialis reflexes are 2+ on the right side and 2+ on the left side.      Patellar reflexes are 2+ on the right side and 2+ on the left side.      Achilles reflexes are 2+ on the right side and 2+ on the left side. Psychiatric:        Speech: Speech normal.        Behavior: Behavior normal.        Thought Content: Thought content normal.        Judgment: Judgment normal.      No results found for any visits on 12/05/20.  Assessment & Plan     Elevated ALT measurement - Plan: Lipid Panel w/o Chol/HDL Ratio  Vitamin D deficiency - Plan: VITAMIN D 25 Hydroxy (Vit-D Deficiency, Fractures)  Depression, unspecified depression type - Plan: CBC with Differential/Platelet, VITAMIN D 25 Hydroxy (Vit-D Deficiency, Fractures), Comprehensive Metabolic Panel (CMET)  Other fatigue - Plan: CBC with Differential/Platelet, TSH, Lipid Panel w/o Chol/HDL Ratio  No orders of the defined types were placed in this encounter.   Orders Placed This Encounter  Procedures  . CBC with Differential/Platelet  . VITAMIN D 25 Hydroxy (Vit-D Deficiency, Fractures)  . Comprehensive Metabolic Panel (CMET)  . TSH  . Lipid Panel w/o Chol/HDL Ratio    Return in about 3 months (around 03/06/2021), or if symptoms worsen or fail to improve, for at any time for any worsening symptoms, Go to Emergency room/ urgent care if worse.      The entirety of the information documented in the History of Present Illness, Review of Systems and Physical Exam were  personally obtained by me. Portions of this information were initially documented by the CMA and reviewed by me for thoroughness and accuracy.    Red Flags discussed. The patient was given clear instructions to go to ER or return to medical center if any red flags develop, symptoms do not improve, worsen or new problems develop. They verbalized understanding.    Marcille Buffy, [place] [phone] (phone) [phone] (fax)  [place]

## 2020-12-21 ENCOUNTER — Telehealth: Payer: Self-pay | Admitting: Adult Health

## 2020-12-21 ENCOUNTER — Other Ambulatory Visit: Payer: Self-pay

## 2020-12-21 ENCOUNTER — Ambulatory Visit: Payer: 59

## 2020-12-21 ENCOUNTER — Encounter: Payer: Self-pay | Admitting: Adult Health

## 2020-12-21 ENCOUNTER — Ambulatory Visit
Admission: RE | Admit: 2020-12-21 | Discharge: 2020-12-21 | Disposition: A | Discharge status: Home / Self Care | Payer: 59 | Source: Ambulatory Visit | Attending: Adult Health | Admitting: Adult Health

## 2020-12-21 DIAGNOSIS — R599 Enlarged lymph nodes, unspecified: Secondary | ICD-10-CM | POA: Diagnosis not present

## 2020-12-21 NOTE — Telephone Encounter (Signed)
Left a message to call back. Patient has sent a message detailing her concerns with her cervical Xray. Message has been sent to PCP

## 2020-12-21 NOTE — Telephone Encounter (Signed)
Provider also responded to patient in [place].

## 2020-12-21 NOTE — Telephone Encounter (Signed)
Pt called she has a question about the ultrasound that is scheduled for today

## 2020-12-22 ENCOUNTER — Telehealth: Payer: Self-pay

## 2020-12-22 DIAGNOSIS — E559 Vitamin D deficiency, unspecified: Secondary | ICD-10-CM

## 2020-12-22 DIAGNOSIS — R5383 Other fatigue: Secondary | ICD-10-CM

## 2020-12-22 NOTE — Progress Notes (Signed)
Non enlarged lymph nodes seen in neck.  No suspicious nodules seen.

## 2020-12-22 NOTE — Telephone Encounter (Signed)
CBC, Lipid Panel, and Vitamin D Labs ordered. See Result note for 11/22/2020 labs

## 2021-01-23 ENCOUNTER — Other Ambulatory Visit: Payer: 59

## 2021-01-26 ENCOUNTER — Other Ambulatory Visit: Payer: No Typology Code available for payment source

## 2021-01-30 ENCOUNTER — Other Ambulatory Visit: Payer: 59

## 2021-01-30 ENCOUNTER — Other Ambulatory Visit (INDEPENDENT_AMBULATORY_CARE_PROVIDER_SITE_OTHER): Payer: No Typology Code available for payment source

## 2021-01-30 ENCOUNTER — Other Ambulatory Visit: Payer: Self-pay

## 2021-01-30 ENCOUNTER — Telehealth: Payer: Self-pay | Admitting: *Deleted

## 2021-01-30 DIAGNOSIS — E559 Vitamin D deficiency, unspecified: Secondary | ICD-10-CM

## 2021-01-30 DIAGNOSIS — E781 Pure hyperglyceridemia: Secondary | ICD-10-CM

## 2021-01-30 DIAGNOSIS — R5383 Other fatigue: Secondary | ICD-10-CM

## 2021-01-30 LAB — LIPID PANEL
Cholesterol: 230 mg/dL — ABNORMAL HIGH (ref 0–200)
HDL: 42.1 mg/dL (ref >39.00)
Total CHOL/HDL Ratio: 5
Triglycerides: 409 mg/dL — ABNORMAL HIGH (ref 0.0–149.0)

## 2021-01-30 LAB — CBC WITH DIFFERENTIAL/PLATELET
Basophils Absolute: 0 10*3/uL (ref 0.0–0.1)
Basophils Relative: 0.3 % (ref 0.0–3.0)
Eosinophils Absolute: 0.1 10*3/uL (ref 0.0–0.7)
Eosinophils Relative: 1.9 % (ref 0.0–5.0)
HCT: 43.2 % (ref 36.0–46.0)
Hemoglobin: 14.8 g/dL (ref 12.0–15.0)
Lymphocytes Relative: 23.4 % (ref 12.0–46.0)
Lymphs Abs: 1.8 10*3/uL (ref 0.7–4.0)
MCHC: 34.3 g/dL (ref 30.0–36.0)
MCV: 86.2 fl (ref 78.0–100.0)
Monocytes Absolute: 0.4 10*3/uL (ref 0.1–1.0)
Monocytes Relative: 5.1 % (ref 3.0–12.0)
Neutro Abs: 5.3 10*3/uL (ref 1.4–7.7)
Neutrophils Relative %: 69.3 % (ref 43.0–77.0)
Platelets: 252 10*3/uL (ref 150.0–400.0)
RBC: 5.01 Mil/uL (ref 3.87–5.11)
RDW: 13.2 % (ref 11.5–15.5)
WBC: 7.7 10*3/uL (ref 4.0–10.5)

## 2021-01-30 LAB — LDL CHOLESTEROL, DIRECT: Direct LDL: 140 mg/dL

## 2021-01-30 LAB — VITAMIN D 25 HYDROXY (VIT D DEFICIENCY, FRACTURES): VITD: 23.32 ng/mL — ABNORMAL LOW (ref 30.00–100.00)

## 2021-01-30 NOTE — Telephone Encounter (Signed)
Pt came for labs today. Lab appt note stated CBC, however there is a Vit D & Lipid ordered on the same day. Please advise if you would like those ran today as well. I drew a tube for them just in case. Pt mentioned that we were to recheck a Vit D today.  Thanks.

## 2021-01-30 NOTE — Addendum Note (Signed)
Addended by: Leeanne Rio on: 01/30/2021 11:08 AM   Modules accepted: Orders

## 2021-01-30 NOTE — Telephone Encounter (Signed)
Yes ok to draw.

## 2021-01-30 NOTE — Telephone Encounter (Signed)
Done. Thanks.

## 2021-01-31 ENCOUNTER — Other Ambulatory Visit: Payer: Self-pay | Admitting: Adult Health

## 2021-01-31 ENCOUNTER — Other Ambulatory Visit (INDEPENDENT_AMBULATORY_CARE_PROVIDER_SITE_OTHER): Payer: No Typology Code available for payment source

## 2021-01-31 DIAGNOSIS — R5383 Other fatigue: Secondary | ICD-10-CM

## 2021-01-31 LAB — COMPREHENSIVE METABOLIC PANEL
ALT: 56 U/L — ABNORMAL HIGH (ref 0–35)
AST: 32 U/L (ref 0–37)
Albumin: 4.5 g/dL (ref 3.5–5.2)
Alkaline Phosphatase: 55 U/L (ref 39–117)
BUN: 12 mg/dL (ref 6–23)
CO2: 27 mEq/L (ref 19–32)
Calcium: 9.5 mg/dL (ref 8.4–10.5)
Chloride: 101 mEq/L (ref 96–112)
Creatinine, Ser: 0.68 mg/dL (ref 0.40–1.20)
GFR: 113.29 mL/min (ref >60.00)
Glucose, Bld: 97 mg/dL (ref 70–99)
Potassium: 4.7 mEq/L (ref 3.5–5.1)
Sodium: 140 mEq/L (ref 135–145)
Total Bilirubin: 1.1 mg/dL (ref 0.2–1.2)
Total Protein: 7.1 g/dL (ref 6.0–8.3)

## 2021-01-31 LAB — HEMOGLOBIN A1C: Hgb A1c MFr Bld: 5.1 % (ref 4.6–6.5)

## 2021-01-31 NOTE — Progress Notes (Signed)
CMP ok, ALT is still mildly elevated, avoid alcohol and tylenol when able. Recheck CMP in 3 months.  Hemoglobin A1C is within normal limits.

## 2021-01-31 NOTE — Addendum Note (Signed)
Addended by: Leeanne Rio on: 01/31/2021 08:35 AM   Modules accepted: Orders

## 2021-01-31 NOTE — Progress Notes (Signed)
Changed orders to future & add on sheet faxed

## 2021-02-01 ENCOUNTER — Other Ambulatory Visit: Payer: Self-pay | Admitting: Adult Health

## 2021-02-01 DIAGNOSIS — E559 Vitamin D deficiency, unspecified: Secondary | ICD-10-CM

## 2021-02-01 MED ORDER — VITAMIN D (ERGOCALCIFEROL) 1.25 MG (50000 UNIT) PO CAPS: 50000.0000 [IU] | ORAL_CAPSULE | ORAL | 0 refills | Status: DC

## 2021-02-01 NOTE — Progress Notes (Signed)
Meds ordered this encounter  Medications   Vitamin D, Ergocalciferol, (DRISDOL) 1.25 MG (50000 UNIT) CAPS capsule    Sig: Take 1 capsule (50,000 Units total) by mouth every 7 (seven) days. (taking one tablet per week) walk in lab in office 1-2 weeks after completing prescription.Take 1 capsule (50,000 Units total) by mouth every 7 (seven) days. (taking one tablet per week) schedule lab office 1-2 weeks after completing prescription.    Dispense:  12 capsule    Refill:  0

## 2021-02-15 ENCOUNTER — Encounter: Payer: Self-pay | Admitting: Obstetrics and Gynecology

## 2021-02-15 ENCOUNTER — Ambulatory Visit: Payer: No Typology Code available for payment source | Admitting: Obstetrics and Gynecology

## 2021-02-15 ENCOUNTER — Other Ambulatory Visit: Payer: Self-pay

## 2021-02-15 ENCOUNTER — Other Ambulatory Visit (HOSPITAL_COMMUNITY)
Admission: RE | Admit: 2021-02-15 | Discharge: 2021-02-15 | Disposition: A | Discharge status: Home / Self Care | Payer: No Typology Code available for payment source | Source: Ambulatory Visit | Attending: Obstetrics and Gynecology | Admitting: Obstetrics and Gynecology

## 2021-02-15 VITALS — BP 100/54 | HR 76 | Ht 63.0 in | Wt 187.7 lb

## 2021-02-15 DIAGNOSIS — Z9889 Other specified postprocedural states: Secondary | ICD-10-CM

## 2021-02-15 DIAGNOSIS — Z0000 Encounter for general adult medical examination without abnormal findings: Secondary | ICD-10-CM

## 2021-02-15 DIAGNOSIS — N926 Irregular menstruation, unspecified: Secondary | ICD-10-CM | POA: Diagnosis not present

## 2021-02-15 DIAGNOSIS — E282 Polycystic ovarian syndrome: Secondary | ICD-10-CM

## 2021-02-15 DIAGNOSIS — Z124 Encounter for screening for malignant neoplasm of cervix: Secondary | ICD-10-CM | POA: Insufficient documentation

## 2021-02-15 DIAGNOSIS — N899 Noninflammatory disorder of vagina, unspecified: Secondary | ICD-10-CM

## 2021-02-15 DIAGNOSIS — Z8742 Personal history of other diseases of the female genital tract: Secondary | ICD-10-CM

## 2021-02-15 DIAGNOSIS — Z8639 Personal history of other endocrine, nutritional and metabolic disease: Secondary | ICD-10-CM

## 2021-02-15 NOTE — Progress Notes (Signed)
Pt present due to having irregular cycles. Pt's last pap 03/06/2014. Pt stated having irregular cycles and ovarian cyst and would like to discuss options

## 2021-02-15 NOTE — Progress Notes (Signed)
GYNECOLOGY ANNUAL PHYSICAL EXAM PROGRESS NOTE  Subjective:    Lacey Harrison is a 35 y.o. G15P0010 female who presents to establish care, and for an annual exam. Reports that she has not seen a GYN in ~ 5 years. The patient is sexually active. The patient wears seatbelts: yes. The patient participates in regular exercise: yes. Has the patient ever been transfused or tattooed?: not asked. The patient reports that there is not domestic violence in her life.    The patient has the following complaints today:  Menstrual irregularities - notes her cycles have been irregular since onset of menses.  Has been concerned about the possibility of PCOS as she notes she has had "many of the symptoms" and a history of ovarian cysts in the past, but has never been assessed.   Currently notes that managing her diet (Keto, elimination of carbs) and exercise helps to regulate her cycles and also manages her moods.  She also complains of concerns about pelvic prolapse.  Notes that she sometimes feels like something is falling down.  Lastly reports a h/o Bartholin's cyst many years ago that became infected. Since that time, she intermittently notes swelling and discomfort in the area. Sometimes is tender and difficult to sit or have intercourse.     Menstrual History: Menarche age: 35 No LMP recorded. (Menstrual status: Irregular Periods). Period Duration (Days): 4 Period Pattern: (!) Irregular Menstrual Flow: Light Menstrual Control: Tampon, Panty liner Menstrual Control Change Freq (Hours): 3-6 Dysmenorrhea: None   Gynecologic History:  Contraception: none History of STI's: Denies Last Pap: approximately 5 years ago. Results were: normal.  Denies h/o abnormal pap smears.       Office Visit from 02/15/2021 in [facility]   02/15/2021   1700   Pregnancy Intention Screening   Does the patient want to become pregnant in the next year? No  Does the patient's partner want to become  pregnant in the next year? Yes  Would the patient like to discuss contraceptive options today? No  Contraception Wrap Up   Current Method No Method - Other Reason  End Method --  Contraception Counseling Provided --    The pregnancy intention screening data noted above was reviewed. Potential methods of contraception were not discussed. The patient elected to proceed with No Method - Other Reason.    OB History  Gravida Para Term Preterm AB Living  1 0 0 0 1 0  SAB IAB Ectopic Multiple Live Births  0 0 1 0 0    # Outcome Date GA Lbr Len/2nd Weight Sex Delivery Anes PTL Lv  1 Ectopic             Past Medical History:  Diagnosis Date   Allergy    Anxiety    Chicken pox    Depression    Heart murmur    Lyme disease    patient reports a remote history of lyme and alludes that she has "chronic lyme"   Migraines    MIGRAINES   Pneumonia 07/2017    Past Surgical History:  Procedure Laterality Date   CHROMOPERTUBATION  02/16/2018   Procedure: CHROMOPERTUBATION;  Surgeon: Schermerhorn, Gwen Her, MD;  Location: [facility] ORS;  Service: Gynecology;;   CYSTECTOMY Left    OVARY   DILATION AND CURETTAGE OF UTERUS  2008   LAPAROSCOPIC LYSIS OF ADHESIONS N/A 02/16/2018   Procedure: EXTENSIVE LAPAROSCOPIC LYSIS OF ADHESIONS;  Surgeon: Boykin Nearing, MD;  Location: [facility] ORS;  Service: Gynecology;  Laterality: N/A;   LAPAROSCOPY N/A 02/16/2018   Procedure: LAPAROSCOPY OPERATIVE;  Surgeon: Schermerhorn, Gwen Her, MD;  Location: [facility] ORS;  Service: Gynecology;  Laterality: N/A;    Family History  Problem Relation Age of Onset   Mental illness Mother    Mental illness Father    Mental illness Maternal Grandmother    Mental illness Paternal Grandmother     Social History   Socioeconomic History   Marital status: Married    Spouse name: Not on file   Number of children: Not on file   Years of education: Not on file   Highest education level: Not on file  Occupational History    Not on file  Tobacco Use   Smoking status: Former    Packs/day: 0.75    Years: 14.00    Pack years: 10.50    Types: Cigarettes    Quit date: 07/05/2016    Years since quitting: 4.6   Smokeless tobacco: Never  Vaping Use   Vaping Use: Former  Substance and Sexual Activity   Alcohol use: Not Currently    Alcohol/week: 11.0 standard drinks    Types: 3 Glasses of wine, 2 Cans of beer, 3 Shots of liquor, 3 Standard drinks or equivalent per week    Comment: almost daily   Drug use: No   Sexual activity: Yes    Birth control/protection: None  Other Topics Concern   Not on file  Social History Narrative   Not on file   Social Determinants of Health   Financial Resource Strain: Not on file  Food Insecurity: Not on file  Transportation Needs: Not on file  Physical Activity: Not on file  Stress: Not on file  Social Connections: Not on file  Intimate Partner Violence: Not on file    Current Outpatient Medications on File Prior to Visit  Medication Sig Dispense Refill   ibuprofen (ADVIL) 200 MG tablet Take 400 mg by mouth as needed.     Vitamin D, Ergocalciferol, (DRISDOL) 1.25 MG (50000 UNIT) CAPS capsule Take 1 capsule (50,000 Units total) by mouth every 7 (seven) days. (taking one tablet per week) walk in lab in office 1-2 weeks after completing prescription.Take 1 capsule (50,000 Units total) by mouth every 7 (seven) days. (taking one tablet per week) schedule lab office 1-2 weeks after completing prescription. 12 capsule 0   No current facility-administered medications on file prior to visit.    Allergies  Allergen Reactions   Tylenol [Acetaminophen] Other (See Comments)    Lingering Taste      Review of Systems Constitutional: negative for chills, fatigue, fevers and sweats Eyes: negative for irritation, redness and visual disturbance Ears, nose, mouth, throat, and face: negative for hearing loss, nasal congestion, snoring and tinnitus Respiratory: negative for  asthma, cough, sputum Cardiovascular: negative for chest pain, dyspnea, exertional chest pressure/discomfort, irregular heart beat, palpitations and syncope Gastrointestinal: negative for abdominal pain, change in bowel habits, nausea and vomiting Genitourinary: positive for abnormal menstrual periods, pelvic pressure, and vaginal swelling.  Negative for genital lesions, sexual problems and vaginal discharge, dysuria and urinary incontinence Integument/breast: negative for breast lump, breast tenderness and nipple discharge Hematologic/lymphatic: negative for bleeding and easy bruising Musculoskeletal:negative for back pain and muscle weakness Neurological: negative for dizziness, headaches, vertigo and weakness Endocrine: negative for diabetic symptoms including polydipsia, polyuria and skin dryness Allergic/Immunologic: negative for hay fever and urticaria        Objective:   Blood pressure (!) 100/54, pulse  76, height 5\' 3"  (1.6 m), weight 187 lb 11.2 oz (85.1 kg).  Body mass index is 33.25 kg/m.   General Appearance:    Alert, cooperative, no distress, appears stated age  Head:    Normocephalic, without obvious abnormality, atraumatic  Eyes:    PERRL, conjunctiva/corneas clear, EOM's intact, both eyes  Ears:    Normal external ear canals, both ears  Nose:   Nares normal, septum midline, mucosa normal, no drainage or sinus tenderness  Throat:   Lips, mucosa, and tongue normal; teeth and gums normal  Neck:   Supple, symmetrical, trachea midline, no adenopathy; thyroid: no enlargement/tenderness/nodules; no carotid bruit or JVD  Back:     Symmetric, no curvature, ROM normal, no CVA tenderness  Lungs:     Clear to auscultation bilaterally, respirations unlabored  Chest Wall:    No tenderness or deformity   Heart:    Regular rate and rhythm, S1 and S2 normal, no murmur, rub or gallop  Breast Exam:    No tenderness, masses, or nipple abnormality  Abdomen:     Soft, non-tender, bowel  sounds active all four quadrants, no masses, no organomegaly.    Genitalia:    Pelvic:external genitalia overall normal with mild swelling of right labia minora, no erythema. No vagina without lesions, discharge, or tenderness, rectovaginal septum  normal. Cervix normal in appearance, no cervical motion tenderness, no adnexal masses or tenderness.  Uterus normal size, shape, mobile, regular contours, nontender.  Rectal:    Normal external sphincter.  No hemorrhoids appreciated. Internal exam not done.   Extremities:   Extremities normal, atraumatic, no cyanosis or edema  Pulses:   2+ and symmetric all extremities  Skin:   Skin color, texture, turgor normal, no rashes or lesions  Lymph nodes:   Cervical, supraclavicular, and axillary nodes normal  Neurologic:   CNII-XII intact, normal strength, sensation and reflexes throughout   .  Labs:  Lab Results  Component Value Date   WBC 7.7 01/30/2021   HGB 14.8 01/30/2021   HCT 43.2 01/30/2021   MCV 86.2 01/30/2021   PLT 252.0 01/30/2021    Lab Results  Component Value Date   CREATININE 0.68 01/31/2021   BUN 12 01/31/2021   NA 140 01/31/2021   K 4.7 01/31/2021   CL 101 01/31/2021   CO2 27 01/31/2021    Lab Results  Component Value Date   ALT 56 (H) 01/31/2021   AST 32 01/31/2021   ALKPHOS 55 01/31/2021   BILITOT 1.1 01/31/2021    Lab Results  Component Value Date   TSH 1.640 11/22/2020     Assessment:   1. Encounter for medical examination to establish care   2. Pap smear for cervical cancer screening   3. Irregular menses   4. History of ovarian cystectomy   5. History of vitamin D deficiency   6. Swelling of vagina     Plan:     Blood tests: see orders (annual labs and assessment for PCOS). Breast self exam technique reviewed and patient encouraged to perform self-exam monthly. Contraception: none.  Patient expresses desire to become pregnant.  Discussed healthy lifestyle modifications. Continue to encourage  dietary modifications and exercise regimen.  Educational material distributed regarding PCOS (can refer to info if diagnosed). No evidence of pelvic prolapse noted on exam today.  Pap smear performed today. COVID vaccination status: has completed series, eligible for booster.  Discussed home treatment measures for occasional swelling of vagina, including use of topical lidocaine and  ice packs.  Discussed options for regulating menstrual cycle. As patient desires to conceive and declines contraception, will prescribe Provera.  Follow up in 1 year for annual exam. To follow up sooner if she plans further attempts at conception. 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 May need reproductive assistance in light of irregular cycles, possible PCOS and surgical findings.   Rubie Maid, MD [facility]

## 2021-02-16 ENCOUNTER — Encounter: Payer: Self-pay | Admitting: Obstetrics and Gynecology

## 2021-02-19 ENCOUNTER — Encounter: Payer: Self-pay | Admitting: Obstetrics and Gynecology

## 2021-02-19 LAB — CYTOLOGY - PAP
Comment: NEGATIVE
Diagnosis: NEGATIVE
High risk HPV: NEGATIVE

## 2021-02-19 MED ORDER — MEDROXYPROGESTERONE ACETATE 10 MG PO TABS: 10.0000 mg | ORAL_TABLET | Freq: Every day | ORAL | 2 refills | Status: DC

## 2021-02-20 ENCOUNTER — Encounter: Payer: Self-pay | Admitting: Adult Health

## 2021-02-24 LAB — PCOS DIAGNOSTIC PROFILE
17-Alpha-Hydroxyprogesterone: 34 ng/dL
ANTI-MULLERIAN HORMONE (AMH): 2.28 ng/mL
DHEA-Sulfate, LCMS: 123 ug/dL
Estradiol, Serum, MS: 41 pg/mL
Follicle Stimulating Hormone: 10 m[IU]/mL
Free Testosterone, Serum: 3.6 pg/mL
Luteinizing Hormone (LH) ECL: 6 m[IU]/mL
Prolactin: 7.33 ng/mL
Sex Hormone Binding Globulin: 21.7 nmol/L — ABNORMAL LOW
TSH: 2.1 uU/mL
Testosterone, Serum (Total): 33 ng/dL
Testosterone-% Free: 1.1 %

## 2021-03-06 ENCOUNTER — Ambulatory Visit: Payer: No Typology Code available for payment source | Admitting: Adult Health

## 2021-03-21 ENCOUNTER — Encounter: Payer: Self-pay | Admitting: Adult Health

## 2021-03-21 ENCOUNTER — Other Ambulatory Visit: Payer: Self-pay | Admitting: Obstetrics and Gynecology

## 2021-03-21 ENCOUNTER — Telehealth: Payer: Self-pay | Admitting: *Deleted

## 2021-03-21 NOTE — Telephone Encounter (Signed)
Left message to call office per My chart message patient is wanting to change lab appt to 05/18/21

## 2021-03-21 NOTE — Telephone Encounter (Signed)
Please place future orders for lab appt.  

## 2021-03-22 ENCOUNTER — Other Ambulatory Visit: Payer: 59

## 2021-05-15 ENCOUNTER — Telehealth: Payer: Self-pay | Admitting: *Deleted

## 2021-05-15 ENCOUNTER — Telehealth: Payer: Self-pay

## 2021-05-15 ENCOUNTER — Encounter: Payer: Self-pay | Admitting: Adult Health

## 2021-05-15 DIAGNOSIS — E782 Mixed hyperlipidemia: Secondary | ICD-10-CM

## 2021-05-15 DIAGNOSIS — E559 Vitamin D deficiency, unspecified: Secondary | ICD-10-CM

## 2021-05-15 NOTE — Telephone Encounter (Signed)
Orders placed for lipid panel and vitamin d for future labs

## 2021-05-15 NOTE — Telephone Encounter (Signed)
Please place future orders for lab appt.    Appt note staes: "lipid & vitamin D"

## 2021-05-15 NOTE — Telephone Encounter (Signed)
Labs ordered.

## 2021-05-16 ENCOUNTER — Telehealth: Payer: Self-pay

## 2021-05-16 DIAGNOSIS — R399 Unspecified symptoms and signs involving the genitourinary system: Secondary | ICD-10-CM

## 2021-05-16 NOTE — Addendum Note (Signed)
Addended by: Baron Hamper on: 05/16/2021 02:26 PM   Modules accepted: Orders

## 2021-05-16 NOTE — Telephone Encounter (Signed)
Ordered placed

## 2021-05-16 NOTE — Telephone Encounter (Signed)
Orders placed for POCT Urinalysis

## 2021-05-17 ENCOUNTER — Encounter: Payer: Self-pay | Admitting: Emergency Medicine

## 2021-05-17 ENCOUNTER — Telehealth: Payer: Self-pay

## 2021-05-17 ENCOUNTER — Other Ambulatory Visit: Payer: Self-pay

## 2021-05-17 ENCOUNTER — Ambulatory Visit
Admission: EM | Admit: 2021-05-17 | Discharge: 2021-05-17 | Disposition: A | Discharge status: Home / Self Care | Payer: No Typology Code available for payment source | Attending: Emergency Medicine | Admitting: Emergency Medicine

## 2021-05-17 DIAGNOSIS — N390 Urinary tract infection, site not specified: Secondary | ICD-10-CM | POA: Insufficient documentation

## 2021-05-17 LAB — URINALYSIS, COMPLETE (UACMP) WITH MICROSCOPIC: WBC, UA: >50 WBC/hpf (ref 0–5)

## 2021-05-17 MED ORDER — CEFTRIAXONE SODIUM 1 G IJ SOLR
1.0000 g | Freq: Once | INTRAMUSCULAR | Status: AC
  Administered 2021-05-17: 1 g via INTRAMUSCULAR

## 2021-05-17 MED ORDER — PHENAZOPYRIDINE HCL 200 MG PO TABS: 200.0000 mg | ORAL_TABLET | Freq: Three times a day (TID) | ORAL | 0 refills | Status: DC | PRN

## 2021-05-17 MED ORDER — ONDANSETRON 8 MG PO TBDP: 8.0000 mg | ORAL_TABLET | Freq: Three times a day (TID) | ORAL | 0 refills | Status: DC | PRN

## 2021-05-17 MED ORDER — CEPHALEXIN 500 MG PO CAPS: 500.0000 mg | ORAL_CAPSULE | Freq: Four times a day (QID) | ORAL | 0 refills | Status: AC

## 2021-05-17 NOTE — Telephone Encounter (Signed)
Copied from [place] [id number]. Topic: Appointment Scheduling - Scheduling Inquiry for Clinic >> May 17, 2021  2:40 PM Erick Blinks wrote: Reason for CRM: Pt needs appointment, established with Sharyn Lull [facility]. Needs new PCP for referrals, tried calling the office  Best contact: [phone] Prefers a female PCP

## 2021-05-17 NOTE — ED Provider Notes (Signed)
HPI  SUBJECTIVE:  Lacey Harrison is a 35 y.o. female who presents with 4 days of constant low midline abdominal pressure/pain with dysuria, urgency, frequency.  She reports nausea, vomiting, and pain going up both of her flanks and radiating to her back starting this morning.  no cloudy or odorous urine, hematuria, fevers.  She is in long-term monogamous relationship with a female who is asymptomatic.  STDs are not a concern today.  No vaginal complaints.  She tried pushing fluids and Azo with improvement in her symptoms.  Symptoms worse with urination and with walking.  Past medical history negative for UTI, nephrolithiasis, STDs, BV, yeast, PID, diabetes.  PMD: Dr. Sharyn Lull Flinchum.  She has follow-up with her tomorrow.   Past Medical History:  Diagnosis Date   Allergy    Anxiety    Chicken pox    Depression    Heart murmur    Lyme disease    patient reports a remote history of lyme and alludes that she has "chronic lyme"   Migraines    MIGRAINES   Pneumonia 07/2017    Past Surgical History:  Procedure Laterality Date   CHROMOPERTUBATION  02/16/2018   Procedure: CHROMOPERTUBATION;  Surgeon: Schermerhorn, Gwen Her, MD;  Location: [facility];  Service: Gynecology;;   CYSTECTOMY Left    OVARY   DILATION AND CURETTAGE OF UTERUS  2008   LAPAROSCOPIC LYSIS OF ADHESIONS N/A 02/16/2018   Procedure: EXTENSIVE LAPAROSCOPIC LYSIS OF ADHESIONS;  Surgeon: Boykin Nearing, MD;  Location: [facility];  Service: Gynecology;  Laterality: N/A;   LAPAROSCOPY N/A 02/16/2018   Procedure: LAPAROSCOPY OPERATIVE;  Surgeon: Schermerhorn, Gwen Her, MD;  Location: [facility];  Service: Gynecology;  Laterality: N/A;    Family History  Problem Relation Age of Onset   Mental illness Mother    Mental illness Father    Mental illness Maternal Grandmother    Mental illness Paternal Grandmother     Social History   Tobacco Use   Smoking status: Former    Packs/day: 0.75    Years: 14.00    Pack years:  10.50    Types: Cigarettes    Quit date: 07/05/2016    Years since quitting: 4.8   Smokeless tobacco: Never  Vaping Use   Vaping Use: Former  Substance Use Topics   Alcohol use: Not Currently    Alcohol/week: 11.0 standard drinks    Types: 3 Glasses of wine, 2 Cans of beer, 3 Shots of liquor, 3 Standard drinks or equivalent per week    Comment: almost daily   Drug use: No     Current Facility-Administered Medications:    cefTRIAXone (ROCEPHIN) injection 1 g, 1 g, Intramuscular, Once, Melynda Ripple, MD  Current Outpatient Medications:    phenazopyridine (PYRIDIUM) 95 MG tablet, Take 95 mg by mouth 3 (three) times daily as needed for pain., Disp: , Rfl:    ibuprofen (ADVIL) 200 MG tablet, Take 400 mg by mouth as needed., Disp: , Rfl:    medroxyPROGESTERone (PROVERA) 10 MG tablet, Take 1 tablet (10 mg total) by mouth daily. Use for ten days, Disp: 10 tablet, Rfl: 2   Vitamin D, Ergocalciferol, (DRISDOL) 1.25 MG (50000 UNIT) CAPS capsule, Take 1 capsule (50,000 Units total) by mouth every 7 (seven) days. (taking one tablet per week) walk in lab in office 1-2 weeks after completing prescription.Take 1 capsule (50,000 Units total) by mouth every 7 (seven) days. (taking one tablet per week) schedule lab office 1-2 weeks after completing  prescription., Disp: 12 capsule, Rfl: 0  Allergies  Allergen Reactions   Tylenol [Acetaminophen] Other (See Comments)    Lingering Taste     ROS  As noted in HPI.   Physical Exam  BP 110/81 (BP Location: Left Arm)   Pulse 80   Temp 98.6 F (37 C) (Oral)   Resp 16   SpO2 97%   Constitutional: Well developed, well nourished, appears uncomfortable Eyes:  EOMI, conjunctiva normal bilaterally HENT: Normocephalic, atraumatic,mucus membranes moist Respiratory: Normal inspiratory effort Cardiovascular: Normal rate GI: nondistended.  Positive exquisite suprapubic and right flank tenderness.  No other abdominal tenderness. Back: No CVAT  bilaterally skin: No rash, skin intact Musculoskeletal: no deformities Neurologic: Alert & oriented x 3, no focal neuro deficits Psychiatric: Speech and behavior appropriate   ED Course   Medications  cefTRIAXone (ROCEPHIN) injection 1 g (has no administration in time range)    Orders Placed This Encounter  Procedures   Urine Culture    Standing Status:   Standing    Number of Occurrences:   1    Order Specific Question:   Indication    Answer:   Flank Pain   Urinalysis, Complete w Microscopic Urine, Clean Catch    Standing Status:   Standing    Number of Occurrences:   1    Order Specific Question:   Release to patient    Answer:   Immediate    Results for orders placed or performed during the hospital encounter of 05/17/21 (from the past 24 hour(s))  Urinalysis, Complete w Microscopic Urine, Clean Catch     Status: Abnormal   Collection Time: 05/17/21 11:34 AM  Result Value Ref Range   Color, Urine ORANGE (A) YELLOW   APPearance HAZY (A) CLEAR   Specific Gravity, Urine  1.005 - 1.030    TEST NOT REPORTED DUE TO COLOR INTERFERENCE OF URINE PIGMENT   pH  5.0 - 8.0    TEST NOT REPORTED DUE TO COLOR INTERFERENCE OF URINE PIGMENT   Glucose, UA (A) NEGATIVE mg/dL    TEST NOT REPORTED DUE TO COLOR INTERFERENCE OF URINE PIGMENT   Hgb urine dipstick (A) NEGATIVE    TEST NOT REPORTED DUE TO COLOR INTERFERENCE OF URINE PIGMENT   Bilirubin Urine (A) NEGATIVE    TEST NOT REPORTED DUE TO COLOR INTERFERENCE OF URINE PIGMENT   Ketones, ur (A) NEGATIVE mg/dL    TEST NOT REPORTED DUE TO COLOR INTERFERENCE OF URINE PIGMENT   Protein, ur (A) NEGATIVE mg/dL    TEST NOT REPORTED DUE TO COLOR INTERFERENCE OF URINE PIGMENT   Nitrite (A) NEGATIVE    TEST NOT REPORTED DUE TO COLOR INTERFERENCE OF URINE PIGMENT   Leukocytes,Ua (A) NEGATIVE    TEST NOT REPORTED DUE TO COLOR INTERFERENCE OF URINE PIGMENT   Squamous Epithelial / LPF 6-10 0 - 5   WBC, UA >50 0 - 5 WBC/hpf   RBC / HPF 6-10 0  - 5 RBC/hpf   Bacteria, UA MANY (A) NONE SEEN   WBC Clumps PRESENT    No results found.  ED Clinical Impression  1. Complicated UTI (urinary tract infection)      ED Assessment/Plan  Concern for complicated urinary tract infection/early pyelonephritis with nausea and vomiting.  Her H&P is consistent with a UTI, urinalysis is limited due to Azo, however, has many bacteria.  Culturing to confirm antibiotic choice and diagnosis.  Giving a gram of Rocephin here because of concern for early pyelonephritis, will send  home with Zofran, Keflex, Pyridium, ibuprofen.  She has follow-up with her doctor tomorrow.  ER return precautions given.  Discussed labs, MDM, treatment plan, and plan for follow-up with patient. Discussed sn/sx that should prompt return to the ED. patient agrees with plan.   Meds ordered this encounter  Medications   cefTRIAXone (ROCEPHIN) injection 1 g      *This clinic note was created using [employer]. Therefore, there may be occasional mistakes despite careful proofreading.  ?    Melynda Ripple, MD 05/17/21 [phone]

## 2021-05-17 NOTE — ED Triage Notes (Signed)
Pt presents today with c/o of bloating with pressure in bladder x 4 days. She has taken Azo since Monday and right flank pain developed today. +n/v, denies fever

## 2021-05-17 NOTE — Discharge Instructions (Addendum)
I concerned that you have a complicated UTI/early pyelonephritis so I have given you 1000 mg of Rocephin here today.  Zofran for nausea and vomiting.  Continue pushing fluids.  Finish the Keflex, even if you feel better.  Take 600 mg of ibuprofen 3-4 times a day as needed for pain.

## 2021-05-18 ENCOUNTER — Other Ambulatory Visit: Payer: Self-pay

## 2021-05-18 ENCOUNTER — Other Ambulatory Visit (INDEPENDENT_AMBULATORY_CARE_PROVIDER_SITE_OTHER): Payer: No Typology Code available for payment source

## 2021-05-18 DIAGNOSIS — E559 Vitamin D deficiency, unspecified: Secondary | ICD-10-CM

## 2021-05-18 DIAGNOSIS — E782 Mixed hyperlipidemia: Secondary | ICD-10-CM

## 2021-05-18 LAB — LIPID PANEL
Cholesterol: 238 mg/dL — ABNORMAL HIGH (ref 0–200)
HDL: 36.9 mg/dL — ABNORMAL LOW (ref >39.00)
LDL Cholesterol: 163 mg/dL — ABNORMAL HIGH (ref 0–99)
NonHDL: 201.47
Total CHOL/HDL Ratio: 6
Triglycerides: 190 mg/dL — ABNORMAL HIGH (ref 0.0–149.0)
VLDL: 38 mg/dL (ref 0.0–40.0)

## 2021-05-18 LAB — VITAMIN D 25 HYDROXY (VIT D DEFICIENCY, FRACTURES): VITD: 23.59 ng/mL — ABNORMAL LOW (ref 30.00–100.00)

## 2021-05-19 ENCOUNTER — Other Ambulatory Visit: Payer: Self-pay | Admitting: Family

## 2021-05-19 MED ORDER — VITAMIN D (ERGOCALCIFEROL) 1.25 MG (50000 UNIT) PO CAPS: 50000.0000 [IU] | ORAL_CAPSULE | ORAL | 0 refills | Status: DC

## 2021-05-19 MED ORDER — SIMVASTATIN 20 MG PO TABS: 20.0000 mg | ORAL_TABLET | Freq: Every day | ORAL | 3 refills | Status: DC

## 2021-05-20 LAB — URINE CULTURE: Culture: >=100000 — AB

## 2021-05-22 ENCOUNTER — Ambulatory Visit: Payer: No Typology Code available for payment source | Admitting: Adult Health

## 2021-08-12 IMAGING — US US THYROID
1 series · 14 of 25 positions shown · non-contrast
Comparison: None.

CLINICAL DATA: Neck tenderness to palpation. Possible left thyroid
nodule.

EXAM:
THYROID ULTRASOUND
TECHNIQUE: Ultrasound examination of the thyroid gland and adjacent soft
tissues was performed.

[Series 1: us thyroid · 14 of 58 slices shown]
[im 1/58]
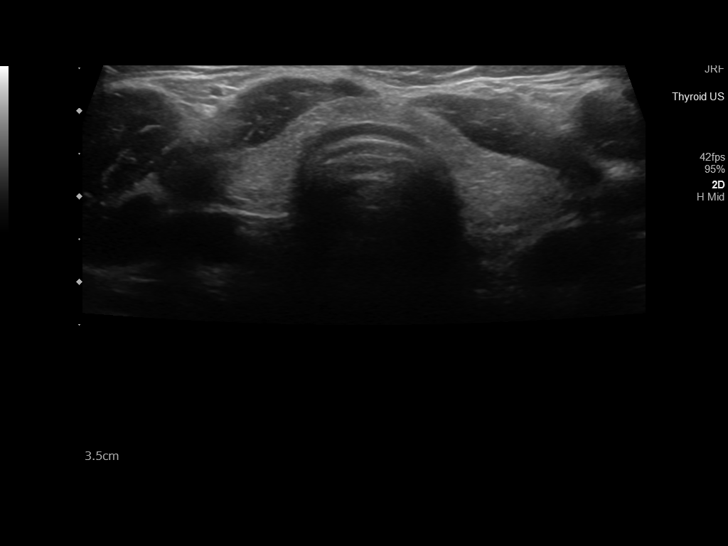
[im 5/58]
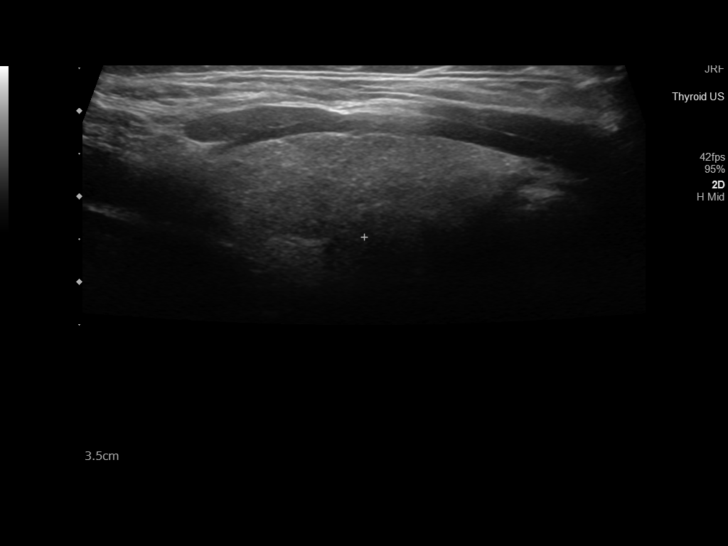
[im 10/58]
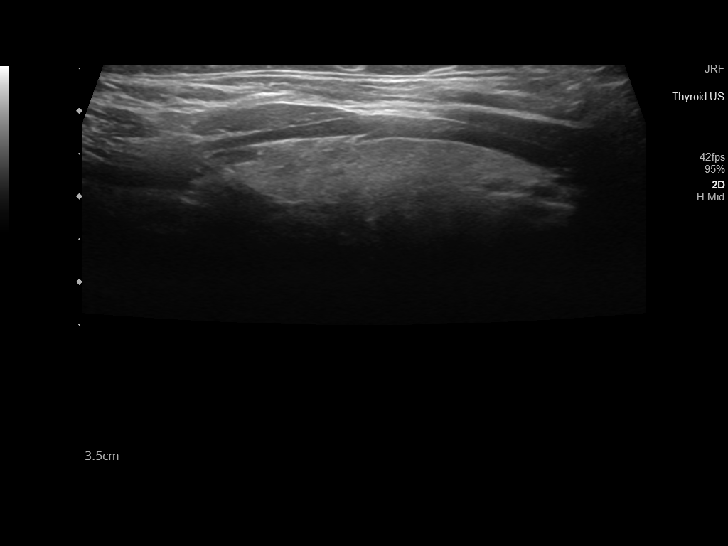
[im 15/58]
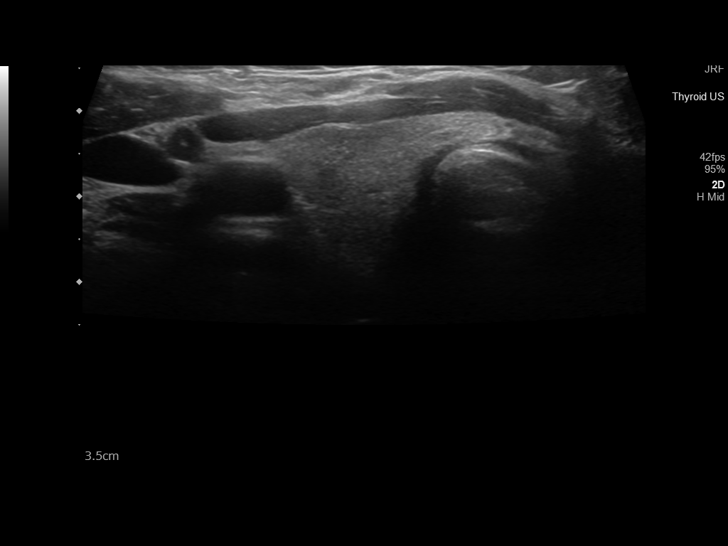
[im 20/58]
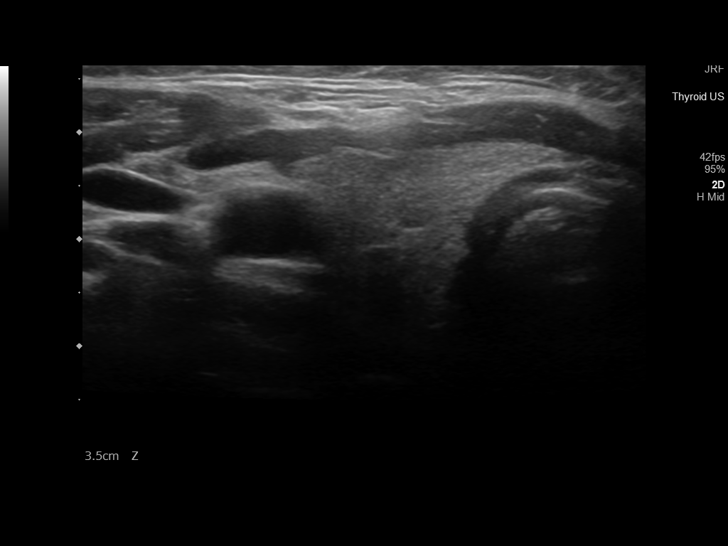
[im 22/58]
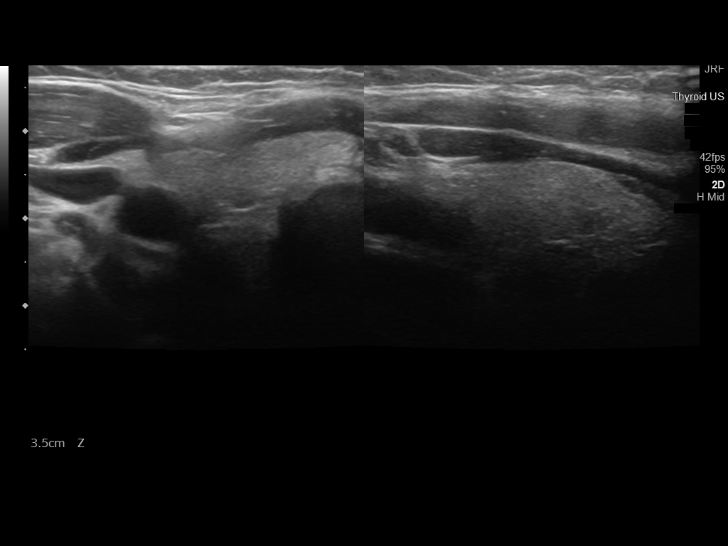
[im 27/58]
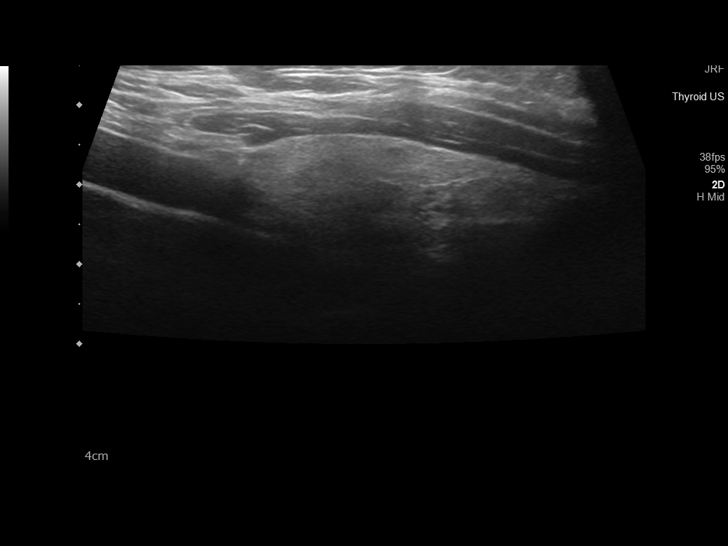
[im 31/58]
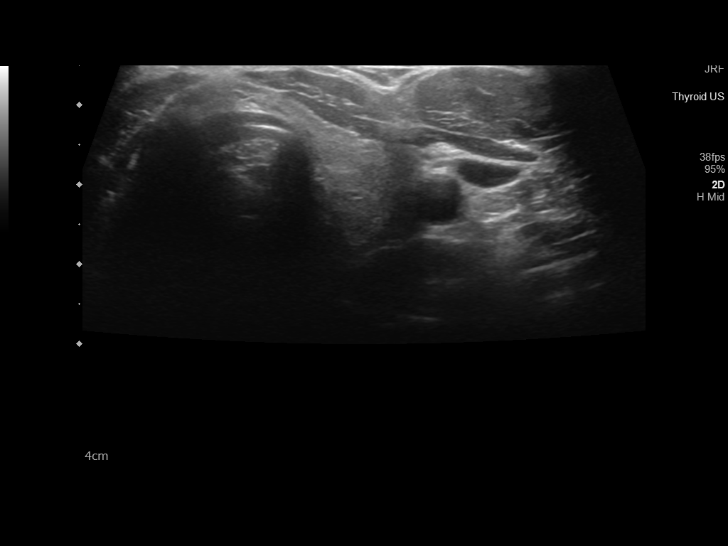
[im 36/58]
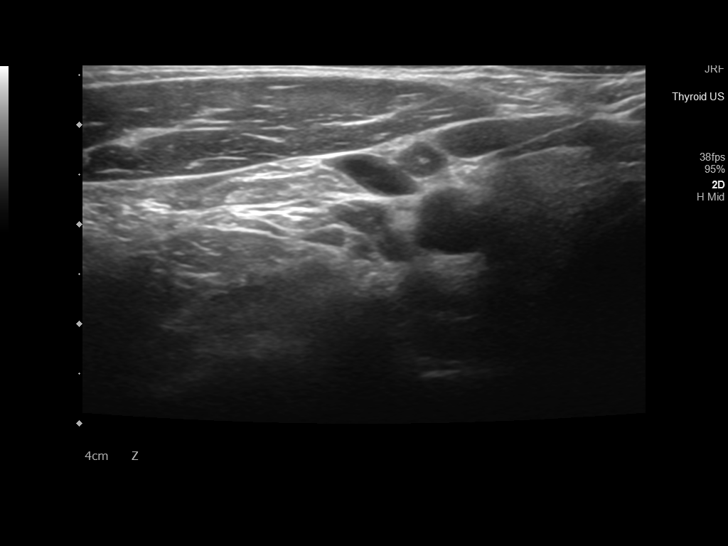
[im 39/58]
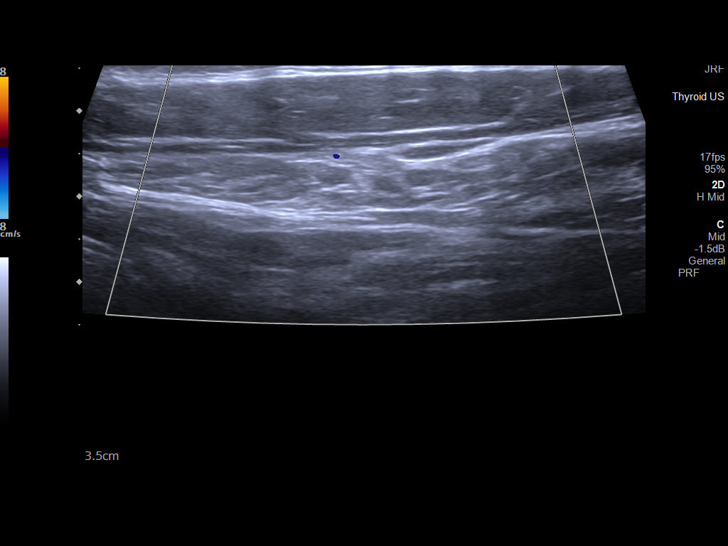
[im 43/58]
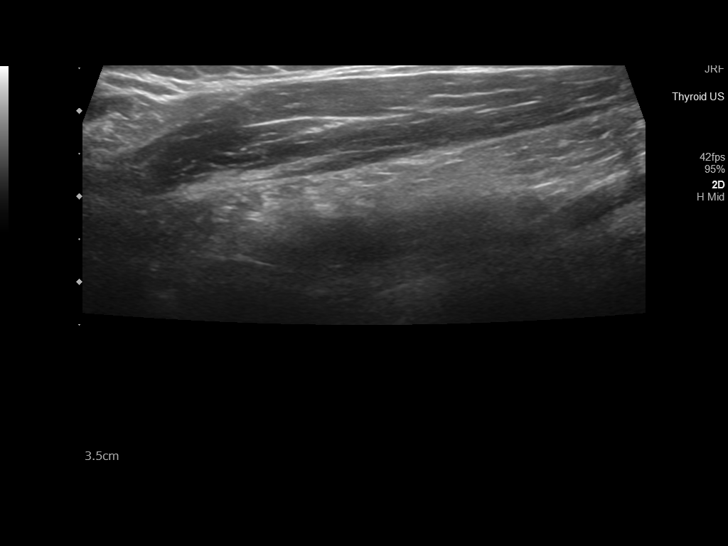
[im 48/58]
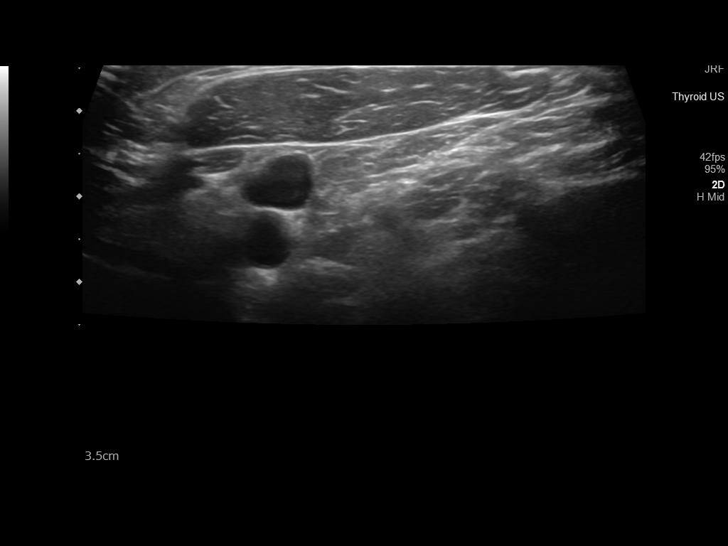
[im 53/58]
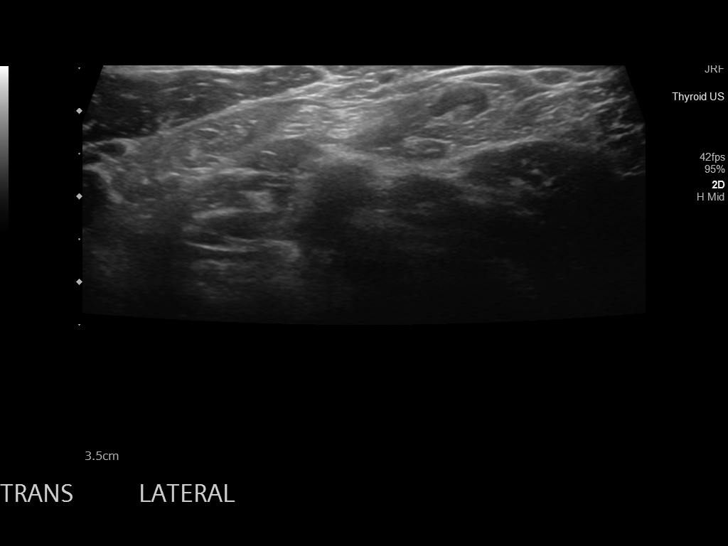
[im 58/58]
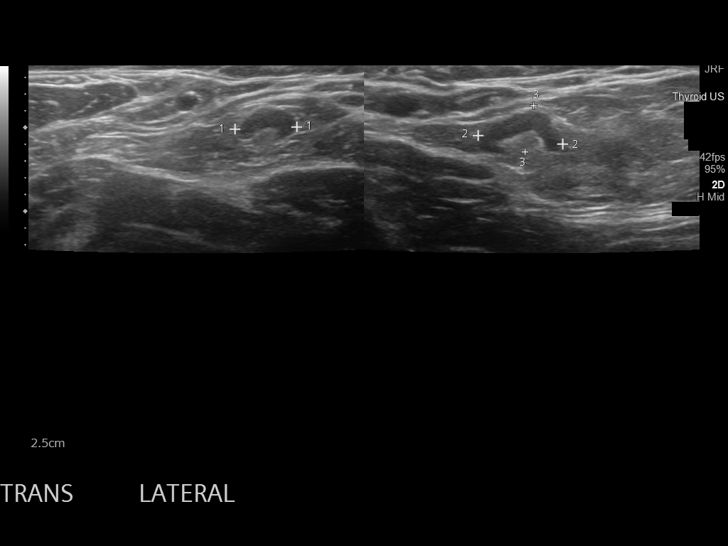

[14 of 25 positions shown; findings below may reference images not displayed]

FINDINGS: Parenchymal Echotexture: Mildly heterogeneous

Isthmus: 0.3 cm

Right lobe: 3.8 x 1.3 x 1.5 cm

Left lobe: 3.9 x 1.2 x 1.6 cm

_________________________________________________________

Estimated total number of nodules >/= 1 cm: 0

Number of spongiform nodules >/=  2 cm not described below (TR1): 0

Number of mixed cystic and solid nodules >/= 1.5 cm not described
below (TR2): 0

_________________________________________________________

0.6 x 0.4 x 0.3 cm hypoechoic solid right thyroid nodule does not
meet criteria for FNA or imaging follow-up.

Multiple nonenlarged left neck lymph nodes are noted.

No discrete sonographic abnormality corresponds to the region of
palpable abnormality in the left neck.
IMPRESSION: No suspicious thyroid nodules.

The above is in keeping with the ACR TI-RADS recommendations - [HOSPITAL] 6124;[DATE].

## 2021-08-15 ENCOUNTER — Other Ambulatory Visit: Payer: Self-pay | Admitting: Family

## 2021-09-25 ENCOUNTER — Other Ambulatory Visit: Payer: Self-pay

## 2021-09-25 ENCOUNTER — Ambulatory Visit
Admission: EM | Admit: 2021-09-25 | Discharge: 2021-09-25 | Disposition: A | Discharge status: Home / Self Care | Payer: No Typology Code available for payment source | Attending: Student | Admitting: Student

## 2021-09-25 ENCOUNTER — Encounter: Payer: Self-pay | Admitting: Emergency Medicine

## 2021-09-25 DIAGNOSIS — Z112 Encounter for screening for other bacterial diseases: Secondary | ICD-10-CM | POA: Insufficient documentation

## 2021-09-25 DIAGNOSIS — J029 Acute pharyngitis, unspecified: Secondary | ICD-10-CM | POA: Diagnosis present

## 2021-09-25 LAB — POCT RAPID STREP A (OFFICE): Rapid Strep A Screen: NEGATIVE

## 2021-09-25 MED ORDER — PROMETHAZINE-DM 6.25-15 MG/5ML PO SYRP: 5.0000 mL | ORAL_SOLUTION | Freq: Four times a day (QID) | ORAL | 0 refills | Status: DC | PRN

## 2021-09-25 MED ORDER — ALBUTEROL SULFATE HFA 108 (90 BASE) MCG/ACT IN AERS: 1.0000 | INHALATION_SPRAY | Freq: Four times a day (QID) | RESPIRATORY_TRACT | 0 refills | Status: DC | PRN

## 2021-09-25 NOTE — ED Provider Notes (Signed)
Roderic Palau    CSN: [id number] Arrival date & time: 09/25/21  7001      History   Chief Complaint Chief Complaint  Patient presents with   Sore Throat    HPI SHAWNTELLE UNGAR is a 36 y.o. female presenting with sore throat and subjective chills x24 hours.. Medical history pneumonia. Describes sore thoat, fatigue, nasal congestion. Denies fevers, n/v/d, shortness of breath, chest pain, cough, facial pain, teeth pain, headaches, loss of taste/smell, swollen lymph nodes, ear pain.    HPI  Past Medical History:  Diagnosis Date   Allergy    Anxiety    Chicken pox    Depression    Heart murmur    Lyme disease    patient reports a remote history of lyme and alludes that she has "chronic lyme"   Migraines    MIGRAINES   Pneumonia 07/2017    Patient Active Problem List   Diagnosis Date Noted   History of ovarian cyst 11/21/2020   Menstrual irregularity 11/21/2020   Vitamin D deficiency 11/21/2020   Screening for blood or protein in urine 11/21/2020   Fatigue 11/21/2020   Swollen lymph nodes 11/21/2020   Otitis externa 11/21/2020   Seasonal allergies 11/21/2020   Tobacco use 07/12/2019   Lyme disease 02/02/2019   Migraines 02/02/2019   Depression 03/07/2016    Past Surgical History:  Procedure Laterality Date   CHROMOPERTUBATION  02/16/2018   Procedure: CHROMOPERTUBATION;  Surgeon: Schermerhorn, Gwen Her, MD;  Location: [facility];  Service: Gynecology;;   CYSTECTOMY Left    OVARY   DILATION AND CURETTAGE OF UTERUS  2008   LAPAROSCOPIC LYSIS OF ADHESIONS N/A 02/16/2018   Procedure: EXTENSIVE LAPAROSCOPIC LYSIS OF ADHESIONS;  Surgeon: Boykin Nearing, MD;  Location: [facility];  Service: Gynecology;  Laterality: N/A;   LAPAROSCOPY N/A 02/16/2018   Procedure: LAPAROSCOPY OPERATIVE;  Surgeon: Schermerhorn, Gwen Her, MD;  Location: [facility];  Service: Gynecology;  Laterality: N/A;    OB History     Gravida  1   Para      Term      Preterm      AB   1   Living         SAB      IAB      Ectopic  1   Multiple      Live Births               Home Medications    Prior to Admission medications   Medication Sig Start Date End Date Taking? Authorizing Provider  albuterol (VENTOLIN HFA) 108 (90 Base) MCG/ACT inhaler Inhale 1-2 puffs into the lungs every 6 (six) hours as needed for wheezing or shortness of breath. 09/25/21  Yes Hazel Sams, PA-C  promethazine-dextromethorphan (PROMETHAZINE-DM) 6.25-15 MG/5ML syrup Take 5 mLs by mouth 4 (four) times daily as needed for cough. 09/25/21  Yes Hazel Sams, PA-C  Vitamin D, Ergocalciferol, (DRISDOL) 1.25 MG (50000 UNIT) CAPS capsule TAKE 1 CAPSULE (50,000 UNITS TOTAL) BY MOUTH EVERY 7 (SEVEN) DAYS 08/19/21  Yes Dutch Quint B, FNP  ondansetron (ZOFRAN ODT) 8 MG disintegrating tablet Take 1 tablet (8 mg total) by mouth every 8 (eight) hours as needed for nausea or vomiting. 05/17/21   Melynda Ripple, MD  phenazopyridine (PYRIDIUM) 200 MG tablet Take 1 tablet (200 mg total) by mouth 3 (three) times daily as needed for pain. 05/17/21   Melynda Ripple, MD  simvastatin (ZOCOR) 20 MG tablet Take  1 tablet (20 mg total) by mouth at bedtime. 05/19/21   Kennyth Arnold, FNP    Family History Family History  Problem Relation Age of Onset   Mental illness Mother    Mental illness Father    Mental illness Maternal Grandmother    Mental illness Paternal Grandmother     Social History Social History   Tobacco Use   Smoking status: Some Days    Packs/day: 0.75    Years: 14.00    Pack years: 10.50    Types: Cigarettes    Last attempt to quit: 07/05/2016    Years since quitting: 5.2   Smokeless tobacco: Never  Vaping Use   Vaping Use: Former  Substance Use Topics   Alcohol use: Not Currently    Alcohol/week: 11.0 standard drinks    Types: 3 Glasses of wine, 2 Cans of beer, 3 Shots of liquor, 3 Standard drinks or equivalent per week    Comment: almost daily   Drug use: No      Allergies   Tylenol [acetaminophen]   Review of Systems Review of Systems  Constitutional:  Negative for appetite change, chills and fever.  HENT:  Positive for congestion and sore throat. Negative for ear pain, rhinorrhea, sinus pressure and sinus pain.   Eyes:  Negative for redness and visual disturbance.  Respiratory:  Negative for cough, chest tightness, shortness of breath and wheezing.   Cardiovascular:  Negative for chest pain and palpitations.  Gastrointestinal:  Negative for abdominal pain, constipation, diarrhea, nausea and vomiting.  Genitourinary:  Negative for dysuria, frequency and urgency.  Musculoskeletal:  Negative for myalgias.  Neurological:  Negative for dizziness, weakness and headaches.  Psychiatric/Behavioral:  Negative for confusion.   All other systems reviewed and are negative.   Physical Exam Triage Vital Signs ED Triage Vitals  Enc Vitals Group     BP      Pulse      Resp      Temp      Temp src      SpO2      Weight      Height      Head Circumference      Peak Flow      Pain Score      Pain Loc      Pain Edu?      Excl. in [place]?    No data found.  Updated Vital Signs BP 106/74 (BP Location: Left Arm)    Pulse 85    Temp 99.3 F (37.4 C) (Oral)    Resp 16    SpO2 95%   Visual Acuity Right Eye Distance:   Left Eye Distance:   Bilateral Distance:    Right Eye Near:   Left Eye Near:    Bilateral Near:     Physical Exam Vitals reviewed.  Constitutional:      General: She is not in acute distress.    Appearance: Normal appearance. She is not ill-appearing.  HENT:     Head: Normocephalic and atraumatic.     Right Ear: Tympanic membrane, ear canal and external ear normal. No tenderness. No middle ear effusion. There is no impacted cerumen. Tympanic membrane is not perforated, erythematous, retracted or bulging.     Left Ear: Tympanic membrane, ear canal and external ear normal. No tenderness.  No middle ear effusion. There is no  impacted cerumen. Tympanic membrane is not perforated, erythematous, retracted or bulging.     Nose: Nose normal. No  congestion.     Mouth/Throat:     Mouth: Mucous membranes are moist.     Pharynx: Uvula midline. Posterior oropharyngeal erythema present. No oropharyngeal exudate.     Comments: Smooth erythema posterior pharynx. Tonsils are small and without exudate. On exam, uvula is midline, she is tolerating her secretions without difficulty, there is no trismus, no drooling, she has normal phonation  Eyes:     Extraocular Movements: Extraocular movements intact.     Pupils: Pupils are equal, round, and reactive to light.  Cardiovascular:     Rate and Rhythm: Normal rate and regular rhythm.     Heart sounds: Normal heart sounds.  Pulmonary:     Effort: Pulmonary effort is normal.     Breath sounds: Normal breath sounds. No decreased breath sounds, wheezing, rhonchi or rales.  Abdominal:     Palpations: Abdomen is soft.     Tenderness: There is no abdominal tenderness. There is no guarding or rebound.  Lymphadenopathy:     Cervical: No cervical adenopathy.     Right cervical: No superficial cervical adenopathy.    Left cervical: No superficial cervical adenopathy.  Neurological:     General: No focal deficit present.     Mental Status: She is alert and oriented to person, place, and time.  Psychiatric:        Mood and Affect: Mood normal.        Behavior: Behavior normal.        Thought Content: Thought content normal.        Judgment: Judgment normal.     UC Treatments / Results  Labs (all labs ordered are listed, but only abnormal results are displayed) Labs Reviewed  CULTURE, GROUP A STREP [facility])  POCT RAPID STREP A (OFFICE)    EKG   Radiology No results found.  Procedures Procedures (including critical care time)  Medications Ordered in UC Medications - No data to display  Initial Impression / Assessment and Plan / UC Course  I have reviewed the triage  vital signs and the nursing notes.  Pertinent labs & imaging results that were available during my care of the patient were reviewed by me and considered in my medical decision making (see chart for details).     This patient is a very pleasant 36 y.o. year old female presenting with viral pharygitis. Afebrile, nontachy. Irregular periods, states negative pregnancy test at home. Declines u-preg here.  Rapid strep negative, culture sent.  Negative home covid test.  Declines influenza testing.   Albuterol inhaler and promethazine DM sent.   ED return precautions discussed. Patient verbalizes understanding and agreement.    Final Clinical Impressions(s) / UC Diagnoses   Final diagnoses:  Screening for streptococcal infection  Viral pharyngitis     Discharge Instructions      -Promethazine DM cough syrup for congestion/cough. This could make you drowsy, so take at night before bed. -Albuterol inhaler as needed for cough, wheezing, shortness of breath, 1 to 2 puffs every 6 hours as needed. -With a virus, you're typically contagious for 5-7 days, or as long as you're having fevers.  -Ibuprofen for pain      ED Prescriptions     Medication Sig Dispense Auth. Provider   albuterol (VENTOLIN HFA) 108 (90 Base) MCG/ACT inhaler Inhale 1-2 puffs into the lungs every 6 (six) hours as needed for wheezing or shortness of breath. 1 each Hazel Sams, PA-C   promethazine-dextromethorphan (PROMETHAZINE-DM) 6.25-15 MG/5ML syrup Take 5 mLs by  mouth 4 (four) times daily as needed for cough. 118 mL Hazel Sams, PA-C      PDMP not reviewed this encounter.   Hazel Sams, PA-C 09/25/21 1125

## 2021-09-25 NOTE — ED Triage Notes (Signed)
Pt c/o ST x 2 days.

## 2021-09-25 NOTE — Discharge Instructions (Addendum)
-  Promethazine DM cough syrup for congestion/cough. This could make you drowsy, so take at night before bed. -Albuterol inhaler as needed for cough, wheezing, shortness of breath, 1 to 2 puffs every 6 hours as needed. -With a virus, you're typically contagious for 5-7 days, or as long as you're having fevers.  -Ibuprofen for pain

## 2021-09-28 LAB — CULTURE, GROUP A STREP (THRC)

## 2021-12-27 ENCOUNTER — Encounter: Payer: No Typology Code available for payment source | Admitting: Obstetrics and Gynecology

## 2022-01-03 ENCOUNTER — Ambulatory Visit: Payer: No Typology Code available for payment source | Admitting: Obstetrics and Gynecology

## 2022-01-03 ENCOUNTER — Encounter: Payer: Self-pay | Admitting: Obstetrics and Gynecology

## 2022-01-03 VITALS — BP 116/74 | HR 75 | Ht 63.0 in | Wt 199.6 lb

## 2022-01-03 DIAGNOSIS — N926 Irregular menstruation, unspecified: Secondary | ICD-10-CM | POA: Diagnosis not present

## 2022-01-03 DIAGNOSIS — Z8742 Personal history of other diseases of the female genital tract: Secondary | ICD-10-CM | POA: Diagnosis not present

## 2022-01-03 DIAGNOSIS — N7011 Chronic salpingitis: Secondary | ICD-10-CM

## 2022-01-03 DIAGNOSIS — Z9889 Other specified postprocedural states: Secondary | ICD-10-CM | POA: Diagnosis not present

## 2022-01-03 MED ORDER — MEDROXYPROGESTERONE ACETATE 10 MG PO TABS: 10.0000 mg | ORAL_TABLET | Freq: Every day | ORAL | 2 refills | Status: DC

## 2022-01-03 NOTE — Progress Notes (Signed)
? ? ?GYNECOLOGY PROGRESS NOTE ? ?Subjective:  ? ? Patient ID: Lacey Harrison, female    DOB: 02/14/1986, 35 y.o.   MRN: [id number] ? ?HPI ? Patient is a 35 y.o. G1P0010 female who presents for irregular menstrual cycles. She has not had a cycle in 6 months. She does have a history of irregular cycles.  Lacey Harrison has questions about early menopause and PCOS concerns. She notes she would like her hormones checked. Complains of having having rosacea, hot flashes and weight gain as symptoms. She was last seen for her irregular cycles ~ 1 year ago. Was prescribed Provera but notes that she never took it.  Was trying to work on improving her lifestyle to see if this would help regulate her cycle. She notes that she and her husband are now desiring to conceive.  ? ?Of note, Lacey Harrison recently began [employer], is trying to work on exercising at least 30 min per day.  ? ?The following portions of the patient's history were reviewed and updated as appropriate: allergies, current medications, past family history, past medical history, past social history, past surgical history, and problem list. ? ?Review of Systems ?Pertinent items noted in HPI and remainder of comprehensive ROS otherwise negative.  ? ?Objective:  ? Blood pressure 116/74, pulse 75, height 5' 3" (1.6 m), weight 199 lb 9.6 oz (90.5 kg).  Body mass index is 35.36 kg/m?. ?General appearance: alert and no distress ?Remainder of exam deferred.  ? ? ?Labs:  ?Lab on 05/18/2021  ?Component Date Value  ? VITD 05/18/2021 23.59 (L)   ? Cholesterol 05/18/2021 238 (H)   ? Triglycerides 05/18/2021 190.0 (H)   ? HDL 05/18/2021 36.90 (L)   ? VLDL 05/18/2021 38.0   ? LDL Cholesterol 05/18/2021 163 (H)   ? Total CHOL/HDL Ratio 05/18/2021 6   ? NonHDL 05/18/2021 201.47   ? TSH 02/15/2021 2.1   ? Testosterone, Serum (Tot* 02/15/2021 33   ? Testosterone-% Free 02/15/2021 1.1   ? Free Testosterone, Serum 02/15/2021 3.6   ? Luteinizing Hormone (LH)* 02/15/2021 6.0   ? Follicle  Stimulating Hor* 02/15/2021 10   ? Prolactin 02/15/2021 7.33   ? 17-Alpha-Hydroxyprogeste* 02/15/2021 34   ? DHEA-Sulfate, LCMS 02/15/2021 123   ? Sex Hormone Binding Glob* 02/15/2021 21.7 (L)   ? Estradiol, Serum, MS 02/15/2021 41   ? ANTI-MULLERIAN HORMONE (* 02/15/2021 2.28   ?Lab on 01/31/2021  ?Component Date Value  ? Hgb A1c MFr Bld 01/31/2021 5.1   ? Sodium 01/31/2021 140   ? Potassium 01/31/2021 4.7   ? Chloride 01/31/2021 101   ? CO2 01/31/2021 27   ? Glucose, Bld 01/31/2021 97   ? BUN 01/31/2021 12   ? Creatinine, Ser 01/31/2021 0.68   ? Total Bilirubin 01/31/2021 1.1   ? Alkaline Phosphatase 01/31/2021 55   ? AST 01/31/2021 32   ? ALT 01/31/2021 56 (H)   ? Total Protein 01/31/2021 7.1   ? Albumin 01/31/2021 4.5   ? GFR 01/31/2021 113.29   ? Calcium 01/31/2021 9.5   ?Lab on 01/30/2021  ?Component Date Value  ? WBC 01/30/2021 7.7   ? RBC 01/30/2021 5.01   ? Hemoglobin 01/30/2021 14.8   ? HCT 01/30/2021 43.2   ? MCV 01/30/2021 86.2   ? MCHC 01/30/2021 34.3   ? RDW 01/30/2021 13.2   ? Platelets 01/30/2021 252.0   ? Neutrophils Relative % 01/30/2021 69.3   ? Lymphocytes Relative 01/30/2021 23.4   ? Monocytes Relative 01/30/2021 5.1   ?   Eosinophils Relative 01/30/2021 1.9   ? Basophils Relative 01/30/2021 0.3   ? Neutro Abs 01/30/2021 5.3   ? Lymphs Abs 01/30/2021 1.8   ? Monocytes Absolute 01/30/2021 0.4   ? Eosinophils Absolute 01/30/2021 0.1   ? Basophils Absolute 01/30/2021 0.0   ? VITD 01/30/2021 23.32 (L)   ? Cholesterol 01/30/2021 230 (H)   ? Triglycerides 01/30/2021 409.0 Triglyceride is over 400; calculations on Lipids are invalid. (H)   ? HDL 01/30/2021 42.10   ? Total CHOL/HDL Ratio 01/30/2021 5   ? Direct LDL 01/30/2021 140.0   ?  ? ?Imaging:  ?CLINICAL DATA:  Pelvic pain x1 day, worse on the left. History of ?dermoid surgically removed 12 years ago. ?  ?EXAM: ?TRANSABDOMINAL AND TRANSVAGINAL ULTRASOUND OF PELVIS ?  ?DOPPLER ULTRASOUND OF OVARIES ?  ?TECHNIQUE: ?Both transabdominal and transvaginal  ultrasound examinations of the ?pelvis were performed. Transabdominal technique was performed for ?global imaging of the pelvis including uterus, ovaries, adnexal ?regions, and pelvic cul-de-sac. ?  ?It was necessary to proceed with endovaginal exam following the ?transabdominal exam to visualize the endometrium and left ovary. ?Color and duplex Doppler ultrasound was utilized to evaluate blood ?flow to the ovaries. ?  ?COMPARISON:  None. ?  ?FINDINGS: ?Uterus ?  ?Measurements: 7.9 x 3.1 x 3.5 cm. No fibroids or other mass ?visualized. ?  ?Endometrium ?  ?Thickness: 9.2 mm.  No focal abnormality visualized. ?  ?Right ovary ?  ?Measurements: 5 x 2.7 x 4 1 cm. Two adjacent follicles versus is a ?septated complex cyst overall measuring 3.9 x 1.6 x 3.2 cm is noted. ?  ?Left ovary ?  ?Measurements: 2.7 x 1.4 x 2.4 cm. Normal appearance/no adnexal mass. ?  ?Pulsed Doppler evaluation of both ovaries demonstrates normal ?low-resistance arterial and venous waveforms. ?  ?Other findings ?  ?Elongated fluid collection in the left adnexa possibly representing ?stigmata of pelvic inflammatory disease/hydrosalpinx with ?differential consideration including a pelvic inclusion cyst. ?  ?IMPRESSION: ?1. Elongated left adnexal fluid possibly representing a hydrosalpinx ?or stigmata of pelvic inflammatory disease. Pelvic inclusion cyst is ?a differential consideration. ?2. Two adjacent follicles within the right ovary versus 1 septated ?complex cyst overall measuring 3.9 x 1.6 x 3.2 cm. This is almost ?certainly to represent a benign finding. ?3. No torsion. ?  ?  ?Electronically Signed ?  By: Jamya Royalty M.D. ?  On: 12/24/2017 21:53 ?  ?Assessment:  ? ?1. Irregular menses   ?2. Hydrosalpinx   ?3. History of ovarian cystectomy   ?  ? ?Plan:  ? ?1. Irregular menses ?-  Reviewed labs from previous visit in June 2022, advised that she was not in early menopause nor did it appear that she had PCOS. Discussed again options for management  of her irregular menses including monthly Provera challenges until spontaneous cycles returned, or initiating combined OCPs for 3 months and then discontinuing. Patient notes she will try the Provera. If no resumption of cycles to follow up in 2 months.   ? ?2. Hydrosalpinx ?- Reviewed last ultrasound, noting hydrosalpinx. Reports having surgery in past to remove hydrosalpinx but surgeon noted extensive scar tissue around it so did not remove. Discussed that if still present on repeat ultrasound, may need to undergo HSG to assess patency and potentially flush debris.  ?Desires sperm testing for husband to rule out female factor issues prior to conceiving. Given information and sample collection kit on semen analysis for partner. ?- US PELVIC COMPLETE WITH TRANSVAGINAL; Future ? ?3.  History of ovarian cystectomy ?- Patient reports history of ovarian cystectomy. Ultrasound in 2019 notes small complex cyst on right. Will follow up.  ? ? ?A total of 25 minutes were spent face-to-face with the patient during this encounter and over half of that time involved counseling and coordination of care.  ? ?Anika Cherry, MD ?[facility] ? ?

## 2022-01-08 ENCOUNTER — Telehealth: Payer: Self-pay | Admitting: Obstetrics and Gynecology

## 2022-01-08 ENCOUNTER — Ambulatory Visit (INDEPENDENT_AMBULATORY_CARE_PROVIDER_SITE_OTHER): Payer: No Typology Code available for payment source

## 2022-01-08 DIAGNOSIS — N7011 Chronic salpingitis: Secondary | ICD-10-CM | POA: Diagnosis not present

## 2022-01-08 NOTE — Telephone Encounter (Signed)
Patient called and was concerned about the findings of her ultrasound she had today. Informed patient that results are not yet released. Informed patient to allow 48 to 72 hours from the time results are released to have a physician call and explain findings. Please advise.  ?

## 2022-01-09 ENCOUNTER — Encounter: Payer: Self-pay | Admitting: Obstetrics and Gynecology

## 2022-01-11 NOTE — Telephone Encounter (Signed)
Yes I did, need to move patient's appointment up to discuss her results.

## 2022-01-16 NOTE — Progress Notes (Signed)
GYNECOLOGY PROGRESS NOTE  Subjective:    Patient ID: Lacey Harrison, female    DOB: October 18, 1985, 36 y.o.   MRN: [id number]  HPI  Patient is a 36 y.o. G23P0010 female who presents for follow up to discuss recent ultrasound results. She had an ultrasound done on 01/08/2022 for evaluation of Infertility, irregular periods, history of hydrosalpinx, and pelvic pain (mostly left-sided).  She does report that since her ultrasound was performed she experienced some mild pelvic pain for approximately 1 week.  On a happier note patient does desire to mention that her menstrual cycle started this past Saturday spontaneously.  Did not have to take the medication that was prescribed (Provera).   The following portions of the patient's history were reviewed and updated as appropriate: allergies, current medications, past family history, past medical history, past social history, past surgical history, and problem list.  Review of Systems Pertinent items are noted in HPI.   Objective:   Blood pressure 113/74, pulse 83, resp. rate 16, weight 200 lb 6.4 oz (90.9 kg).  Body mass index is 35.5 kg/m. General appearance: alert and no distress Remainder of exam deferred   Imaging:  US PELVIC COMPLETE WITH TRANSVAGINAL Patient Name: Lacey Harrison DOB: 1986/03/16 MRN: [id number]  ULTRASOUND REPORT Location: [facility]  Date of Service: 01/08/2022   Indications:Infertility, irregular periods, Hx Hydrosalpinx, Hx Rt ov cyst Findings:  The uterus is anteverted and measures 7.8 x 3.2 x 3.6 cm. Echo texture is heterogenous without evidence of focal masses.  The Endometrium measures 11 mm.  In the left adnexa, there is a > 12 cm structure with Fluid/Fluid levels  that is presumed to arise from the Left Fallopian tube, based on patient's  history; otherwise unable to characterize due to it's size and patient's  severe pain with scanning.  Ovaries are not visualized.  Impression: 1. Left  adnexal cystic mass.  Recommendations: 1.Clinical correlation with the patient's History and Physical Exam. 2. Findings discussed with Dr Marcelline Mates.  Vivien Rota  Henderson-Gainey  I have reviewed this study and agree with documented findings. Persistent  suspected left hydrosalpinx visualized.   Rubie Maid, MD [facility]    Assessment:   1. Irregular menses   2. Hydrosalpinx   3. At risk for fertility problems      Plan:   -Lengthy discussion had with patient regarding her ultrasound results.  Advised that if pregnancy was desired that she would most likely need removal of the hydrosalpinx and lysis of adhesions.  Patient made mention during her last visit that the last surgeon that attempted to operate was unable to remove for hydrosalpinx due to the extensive nature of the adhesions.  I discussed that based on this it may be wise to consider use of the robotic approach in order to be able to extensively dissect the left pelvis.  I advised that with removal of the hydrosalpinx as well as months of adhesions she may also note significant improvement in her overall pelvic pain.  Patient notes understanding.  Overall over screens were unable to be assessed, cannot assess during surgical exploration.  Uterus appears otherwise normal on ultrasound so no concerns.  Also discussed the option of performing chromotubation during her surgery to assess for patency of the remaining tube after other surgical intervention has been performed.  Patient okay to proceed with surgical intervention at this time.  We will send several dates that patient can be scheduled. -Still awaiting for patient's husband to  perform semen analysis for female factor evaluation.   A total of 35 minutes were spent face-to-face with the patient during this encounter and over half of that time involved counseling and coordination of care.   Rubie Maid, MD [facility]

## 2022-01-22 ENCOUNTER — Encounter: Payer: Self-pay | Admitting: Obstetrics and Gynecology

## 2022-01-22 ENCOUNTER — Ambulatory Visit: Payer: No Typology Code available for payment source | Admitting: Obstetrics and Gynecology

## 2022-01-22 VITALS — BP 113/74 | HR 83 | Resp 16 | Wt 200.4 lb

## 2022-01-22 DIAGNOSIS — N926 Irregular menstruation, unspecified: Secondary | ICD-10-CM

## 2022-01-22 DIAGNOSIS — N7011 Chronic salpingitis: Secondary | ICD-10-CM | POA: Diagnosis not present

## 2022-01-22 DIAGNOSIS — Z9189 Other specified personal risk factors, not elsewhere classified: Secondary | ICD-10-CM

## 2022-01-29 ENCOUNTER — Encounter: Payer: No Typology Code available for payment source | Admitting: Obstetrics and Gynecology

## 2022-02-19 ENCOUNTER — Encounter: Payer: No Typology Code available for payment source | Admitting: Obstetrics and Gynecology

## 2022-02-22 ENCOUNTER — Encounter: Payer: Self-pay | Admitting: Obstetrics and Gynecology

## 2022-03-08 ENCOUNTER — Other Ambulatory Visit: Payer: Self-pay

## 2022-03-08 ENCOUNTER — Encounter
Admission: RE | Admit: 2022-03-08 | Discharge: 2022-03-08 | Disposition: A | Discharge status: Home / Self Care | Payer: No Typology Code available for payment source | Source: Ambulatory Visit | Attending: Obstetrics and Gynecology | Admitting: Obstetrics and Gynecology

## 2022-03-08 DIAGNOSIS — Z01812 Encounter for preprocedural laboratory examination: Secondary | ICD-10-CM

## 2022-03-08 NOTE — Patient Instructions (Addendum)
Your procedure is scheduled on: 03/18/22 - Monday Report to the Registration Desk on the 1st floor of the [facility]. To find out your arrival time, please call [phone] between 1PM - 3PM on: 07/21 23 - Friday If your arrival time is 6:00 am, do not arrive prior to that time as the [place] entrance doors do not open until 6:00 am.  REMEMBER: Instructions that are not followed completely may result in serious medical risk, up to and including death; or upon the discretion of your surgeon and anesthesiologist your surgery may need to be rescheduled.  Do not eat food after midnight the night before surgery.  No gum chewing, lozengers or hard candies.  You may however, drink CLEAR liquids up to 2 hours before you are scheduled to arrive for your surgery. Do not drink anything within 2 hours of your scheduled arrival time.  Clear liquids include: - water  - apple juice without pulp - gatorade (not RED colors) - black coffee or tea (Do NOT add milk or creamers to the coffee or tea) Do NOT drink anything that is not on this list.  In addition, your doctor has ordered for you to drink the provided  Ensure Pre-Surgery Clear Carbohydrate Drink  Drinking this carbohydrate drink up to two hours before surgery helps to reduce insulin resistance and improve patient outcomes. Please complete drinking 2 hours prior to scheduled arrival time.  TAKE THESE MEDICATIONS THE MORNING OF SURGERY WITH A SIP OF WATER: NONE  Use albuterol (VENTOLIN HFA) 108 (90 Base) MCG/ACT inhaler on the day of surgery and bring to the hospital.  One week prior to surgery: Stop Anti-inflammatories (NSAIDS) such as Advil, Aleve, Ibuprofen, Motrin, Naproxen, Naprosyn and Aspirin based products such as Excedrin, Goodys Powder, BC Powder.  Stop ANY OVER THE COUNTER supplements until after surgery.  No Alcohol for 24 hours before or after surgery.  No Smoking including e-cigarettes for 24 hours prior to surgery.   No chewable tobacco products for at least 6 hours prior to surgery.  No nicotine patches on the day of surgery.  Do not use any "recreational" drugs for at least a week prior to your surgery.  Please be advised that the combination of cocaine and anesthesia may have negative outcomes, up to and including death. If you test positive for cocaine, your surgery will be cancelled.  On the morning of surgery brush your teeth with toothpaste and water, you may rinse your mouth with mouthwash if you wish. Do not swallow any toothpaste or mouthwash.  Use CHG Soap or wipes as directed on instruction sheet.  Do not wear jewelry, make-up, hairpins, clips or nail polish.  Do not wear lotions, powders, or perfumes.   Do not shave body from the neck down 48 hours prior to surgery just in case you cut yourself which could leave a site for infection.  Also, freshly shaved skin may become irritated if using the CHG soap.  Contact lenses, hearing aids and dentures may not be worn into surgery.  Do not bring valuables to the hospital. [facility] is not responsible for any missing/lost belongings or valuables.   Notify your doctor if there is any change in your medical condition (cold, fever, infection).  Wear comfortable clothing (specific to your surgery type) to the hospital.  After surgery, you can help prevent lung complications by doing breathing exercises.  Take deep breaths and cough every 1-2 hours. Your doctor may order a device called an Incentive  Spirometer to help you take deep breaths. When coughing or sneezing, hold a pillow firmly against your incision with both hands. This is called "splinting." Doing this helps protect your incision. It also decreases belly discomfort.  If you are being admitted to the hospital overnight, leave your suitcase in the car. After surgery it may be brought to your room.  If you are being discharged the day of surgery, you will not be allowed to drive  home. You will need a responsible adult (18 years or older) to drive you home and stay with you that night.   If you are taking public transportation, you will need to have a responsible adult (18 years or older) with you. Please confirm with your physician that it is acceptable to use public transportation.   Please call the [facility] Dept. at [phone] if you have any questions about these instructions.  Surgery Visitation Policy:  Patients undergoing a surgery or procedure may have two family members or support persons with them as long as the person is not COVID-19 positive or experiencing its symptoms.   Inpatient Visitation:    Visiting hours are 7 a.m. to 8 p.m. Up to four visitors are allowed at one time in a patient room, including children. The visitors may rotate out with other people during the day. One designated support person (adult) may remain overnight.

## 2022-03-11 ENCOUNTER — Encounter
Admission: RE | Admit: 2022-03-11 | Discharge: 2022-03-11 | Disposition: A | Discharge status: Home / Self Care | Payer: No Typology Code available for payment source | Source: Ambulatory Visit | Attending: Obstetrics and Gynecology | Admitting: Obstetrics and Gynecology

## 2022-03-11 DIAGNOSIS — N7011 Chronic salpingitis: Secondary | ICD-10-CM | POA: Diagnosis not present

## 2022-03-11 DIAGNOSIS — Z9189 Other specified personal risk factors, not elsewhere classified: Secondary | ICD-10-CM | POA: Diagnosis not present

## 2022-03-11 DIAGNOSIS — N736 Female pelvic peritoneal adhesions (postinfective): Secondary | ICD-10-CM | POA: Insufficient documentation

## 2022-03-11 DIAGNOSIS — Z01812 Encounter for preprocedural laboratory examination: Secondary | ICD-10-CM

## 2022-03-11 DIAGNOSIS — Z01818 Encounter for other preprocedural examination: Secondary | ICD-10-CM | POA: Insufficient documentation

## 2022-03-11 LAB — CBC
HCT: 44.9 % (ref 36.0–46.0)
Hemoglobin: 15.4 g/dL — ABNORMAL HIGH (ref 12.0–15.0)
MCH: 28.8 pg (ref 26.0–34.0)
MCHC: 34.3 g/dL (ref 30.0–36.0)
MCV: 83.9 fL (ref 80.0–100.0)
Platelets: 321 10*3/uL (ref 150–400)
RBC: 5.35 MIL/uL — ABNORMAL HIGH (ref 3.87–5.11)
RDW: 12.3 % (ref 11.5–15.5)
WBC: 9.1 10*3/uL (ref 4.0–10.5)
nRBC: 0 % (ref 0.0–0.2)

## 2022-03-11 LAB — TYPE AND SCREEN
ABO/RH(D): A POS
Antibody Screen: NEGATIVE

## 2022-03-12 ENCOUNTER — Encounter: Payer: Self-pay | Admitting: Obstetrics and Gynecology

## 2022-03-12 ENCOUNTER — Ambulatory Visit (INDEPENDENT_AMBULATORY_CARE_PROVIDER_SITE_OTHER): Payer: No Typology Code available for payment source | Admitting: Obstetrics and Gynecology

## 2022-03-12 VITALS — BP 105/63 | Ht 63.0 in | Wt 201.0 lb

## 2022-03-12 DIAGNOSIS — N736 Female pelvic peritoneal adhesions (postinfective): Secondary | ICD-10-CM

## 2022-03-12 DIAGNOSIS — N7011 Chronic salpingitis: Secondary | ICD-10-CM

## 2022-03-12 DIAGNOSIS — Z01812 Encounter for preprocedural laboratory examination: Secondary | ICD-10-CM

## 2022-03-12 DIAGNOSIS — Z9189 Other specified personal risk factors, not elsewhere classified: Secondary | ICD-10-CM

## 2022-03-12 NOTE — Progress Notes (Signed)
GYNECOLOGY PREOPERATIVE HISTORY AND PHYSICAL   Subjective:  Lacey Harrison is a 36 y.o. G1P0010 here for surgical management of evaluation of Infertility, irregular periods, history of hydrosalpinx, and pelvic pain (mostly left-sided).  Significant preoperative concerns: pelvic adhesions leading to bladder injury in a prior surgery.  Has had h/o chromotubation procedure in 2019, with spillage noted from right tube.   Proposed surgery: Robotic adhesiolysis, left salpingectomy, chromotubation.    Pertinent Gynecological History: Menses: Irregular menses. Patient's last menstrual period was 02/25/2022 (approximate). Contraception:  none Last pap: normal Date: 02/15/2021   Past Medical History:  Diagnosis Date   Allergy    Anxiety    Chicken pox    Depression    Heart murmur    Lyme disease    patient reports a remote history of lyme and alludes that she has "chronic lyme"   Migraines    MIGRAINES   Pneumonia 07/2017    Past Surgical History:  Procedure Laterality Date   CHROMOPERTUBATION  02/16/2018   Procedure: CHROMOPERTUBATION;  Surgeon: Harrison, Lacey Her, MD;  Location: [facility] ORS;  Service: Gynecology;;   CYSTECTOMY Left    OVARY   DILATION AND CURETTAGE OF UTERUS  2008   LAPAROSCOPIC LYSIS OF ADHESIONS N/A 02/16/2018   Procedure: EXTENSIVE LAPAROSCOPIC LYSIS OF ADHESIONS;  Surgeon: Lacey Nearing, MD;  Location: [facility] ORS;  Service: Gynecology;  Laterality: N/A;   LAPAROSCOPY N/A 02/16/2018   Procedure: LAPAROSCOPY OPERATIVE;  Surgeon: Harrison, Lacey Her, MD;  Location: [facility] ORS;  Service: Gynecology;  Laterality: N/A;   OB History  Gravida Para Term Preterm AB Living  1       1    SAB IAB Ectopic Multiple Live Births      1        # Outcome Date GA Lbr Len/2nd Weight Sex Delivery Anes PTL Lv  1 Ectopic             Family History  Problem Relation Age of Onset   Mental illness Mother    Mental illness Father    Mental illness Maternal  Grandmother    Mental illness Paternal Grandmother     Social History   Socioeconomic History   Marital status: Married    Spouse name: Lacey Harrison   Number of children: Not on file   Years of education: Not on file   Highest education level: Not on file  Occupational History   Not on file  Tobacco Use   Smoking status: Some Days    Packs/day: 0.75    Years: 14.00    Total pack years: 10.50    Types: Cigarettes    Last attempt to quit: 07/05/2016    Years since quitting: 5.6   Smokeless tobacco: Never  Vaping Use   Vaping Use: Former  Substance and Sexual Activity   Alcohol use: Not Currently    Alcohol/week: 11.0 standard drinks of alcohol    Types: 3 Glasses of wine, 2 Cans of beer, 3 Shots of liquor, 3 Standard drinks or equivalent per week    Comment: almost daily   Drug use: No   Sexual activity: Yes    Birth control/protection: None  Other Topics Concern   Not on file  Social History Narrative   Not on file   Social Determinants of Health   Financial Resource Strain: Not on file  Food Insecurity: Not on file  Transportation Needs: Not on file  Physical Activity: Not on file  Stress: Not  on file  Social Connections: Not on file  Intimate Partner Violence: Not on file    Current Outpatient Medications on File Prior to Visit  Medication Sig Dispense Refill   albuterol (VENTOLIN HFA) 108 (90 Base) MCG/ACT inhaler Inhale 1-2 puffs into the lungs every 6 (six) hours as needed for wheezing or shortness of breath. 1 each 0   ibuprofen (ADVIL) 200 MG tablet Take 200 mg by mouth every 6 (six) hours as needed.     Vitamin D, Ergocalciferol, (DRISDOL) 1.25 MG (50000 UNIT) CAPS capsule Take 50,000 Units by mouth every 7 (seven) days.     No current facility-administered medications on file prior to visit.    Allergies  Allergen Reactions   Tylenol [Acetaminophen] Other (See Comments)    Lingering Taste     Review of Systems Constitutional: No recent  fever/chills/sweats Respiratory: No recent cough/bronchitis Cardiovascular: No chest pain Gastrointestinal: No recent nausea/vomiting/diarrhea Genitourinary: No UTI symptoms Hematologic/lymphatic:No history of coagulopathy or recent blood thinner use    Objective:   Blood pressure 105/63, height '5\' 3"'$  (1.6 m), weight 201 lb (91.2 kg), last menstrual period 02/25/2022. CONSTITUTIONAL: Well-developed, well-nourished female in no acute distress.  HENT:  Normocephalic, atraumatic, External right and left ear normal. Oropharynx is clear and moist EYES: Conjunctivae and EOM are normal. Pupils are equal, round, and reactive to light. No scleral icterus.  NECK: Normal range of motion, supple, no masses SKIN: Skin is warm and dry. No rash noted. Not diaphoretic. No erythema. No pallor. NEUROLOGIC: Alert and oriented to person, place, and time. Normal reflexes, muscle tone coordination. No cranial nerve deficit noted. PSYCHIATRIC: Normal mood and affect. Normal behavior. Normal judgment and thought content. CARDIOVASCULAR: Normal heart rate noted, regular rhythm RESPIRATORY: Effort and breath sounds normal, no problems with respiration noted ABDOMEN: Soft, nontender, nondistended. PELVIC: Deferred MUSCULOSKELETAL: Normal range of motion. No edema and no tenderness. 2+ distal pulses.   Labs: Results for orders placed or performed during the hospital encounter of 03/11/22 (from the past 336 hour(s))  CBC   Collection Time: 03/11/22  3:42 PM  Result Value Ref Range   WBC 9.1 4.0 - 10.5 K/uL   RBC 5.35 (H) 3.87 - 5.11 MIL/uL   Hemoglobin 15.4 (H) 12.0 - 15.0 g/dL   HCT 44.9 36.0 - 46.0 %   MCV 83.9 80.0 - 100.0 fL   MCH 28.8 26.0 - 34.0 pg   MCHC 34.3 30.0 - 36.0 g/dL   RDW 12.3 11.5 - 15.5 %   Platelets 321 150 - 400 K/uL   nRBC 0.0 0.0 - 0.2 %  Type and screen   Collection Time: 03/11/22  3:42 PM  Result Value Ref Range   ABO/RH(D) A POS    Antibody Screen NEG    Sample Expiration  03/25/2022,2359    Extend sample reason      NO TRANSFUSIONS OR PREGNANCY IN THE PAST 3 MONTHS Performed at [facility], [address], [place], [place] [address]      Imaging Studies: US PELVIC COMPLETE WITH TRANSVAGINAL Patient Name: Lacey Harrison DOB: 1986-05-20 MRN: [id number]  ULTRASOUND REPORT Location: [facility]  Date of Service: 01/08/2022   Indications:Infertility, irregular periods, Hx Hydrosalpinx, Hx Rt ov cyst Findings:  The uterus is anteverted and measures 7.8 x 3.2 x 3.6 cm. Echo texture is heterogenous without evidence of focal masses.  The Endometrium measures 11 mm.  In the left adnexa, there is a > 12 cm structure with Fluid/Fluid levels  that is presumed to arise from the Left Fallopian tube, based on patient's  history; otherwise unable to characterize due to it's size and patient's  severe pain with scanning.  Ovaries are not visualized.  Impression: 1. Left adnexal cystic mass.  Recommendations: 1.Clinical correlation with the patient's History and Physical Exam. 2. Findings discussed with Dr Marcelline Mates.  Vivien Rota  Henderson-Gainey  I have reviewed this study and agree with documented findings. Persistent  suspected left hydrosalpinx visualized.   Rubie Maid, MD [facility]    Assessment:    1. Hydrosalpinx   2. At risk for fertility problems   3. Pre-operative laboratory examination   4. Pelvic adhesive disease     Plan:   - Counseling: Procedure, risks, reasons, benefits and complications (including injury to bowel, bladder, major blood vessel, ureter, bleeding, possibility of transfusion, infection, or fistula formation) reviewed in detail. Discussed possibility of loss of other tube if appears to be diseased or non-functional. Likelihood of success in alleviating the patient's condition was discussed. Routine postoperative instructions will be reviewed with the patient and her family in detail after  surgery.  The patient concurred with the proposed plan, giving informed written consent for the surgery.   - Preop testing ordered. - Instructions reviewed, including NPO after midnight.    Rubie Maid, MD [facility]

## 2022-03-18 ENCOUNTER — Ambulatory Visit
Admission: RE | Admit: 2022-03-18 | Discharge: 2022-03-18 | Disposition: A | Discharge status: Home / Self Care | Payer: No Typology Code available for payment source | Source: Home / Self Care | Attending: Obstetrics and Gynecology | Admitting: Obstetrics and Gynecology

## 2022-03-18 ENCOUNTER — Other Ambulatory Visit: Payer: Self-pay

## 2022-03-18 ENCOUNTER — Ambulatory Visit: Payer: No Typology Code available for payment source | Admitting: Anesthesiology

## 2022-03-18 ENCOUNTER — Encounter: Payer: Self-pay | Admitting: Obstetrics and Gynecology

## 2022-03-18 ENCOUNTER — Encounter
Admission: RE | Disposition: A | Discharge status: Home / Self Care | Payer: Self-pay | Source: Home / Self Care | Attending: Obstetrics and Gynecology

## 2022-03-18 DIAGNOSIS — N882 Stricture and stenosis of cervix uteri: Secondary | ICD-10-CM | POA: Insufficient documentation

## 2022-03-18 DIAGNOSIS — K668 Other specified disorders of peritoneum: Secondary | ICD-10-CM | POA: Insufficient documentation

## 2022-03-18 DIAGNOSIS — T8144XA Sepsis following a procedure, initial encounter: Secondary | ICD-10-CM | POA: Diagnosis not present

## 2022-03-18 DIAGNOSIS — N7011 Chronic salpingitis: Secondary | ICD-10-CM

## 2022-03-18 DIAGNOSIS — F419 Anxiety disorder, unspecified: Secondary | ICD-10-CM | POA: Insufficient documentation

## 2022-03-18 DIAGNOSIS — R102 Pelvic and perineal pain: Secondary | ICD-10-CM | POA: Insufficient documentation

## 2022-03-18 DIAGNOSIS — G43909 Migraine, unspecified, not intractable, without status migrainosus: Secondary | ICD-10-CM | POA: Insufficient documentation

## 2022-03-18 DIAGNOSIS — N926 Irregular menstruation, unspecified: Secondary | ICD-10-CM | POA: Insufficient documentation

## 2022-03-18 DIAGNOSIS — T8143XA Infection following a procedure, organ and space surgical site, initial encounter: Secondary | ICD-10-CM | POA: Diagnosis not present

## 2022-03-18 DIAGNOSIS — N8000 Endometriosis of the uterus, unspecified: Secondary | ICD-10-CM | POA: Insufficient documentation

## 2022-03-18 DIAGNOSIS — L905 Scar conditions and fibrosis of skin: Secondary | ICD-10-CM | POA: Insufficient documentation

## 2022-03-18 DIAGNOSIS — N736 Female pelvic peritoneal adhesions (postinfective): Secondary | ICD-10-CM | POA: Insufficient documentation

## 2022-03-18 DIAGNOSIS — F17210 Nicotine dependence, cigarettes, uncomplicated: Secondary | ICD-10-CM | POA: Insufficient documentation

## 2022-03-18 DIAGNOSIS — N979 Female infertility, unspecified: Secondary | ICD-10-CM | POA: Insufficient documentation

## 2022-03-18 DIAGNOSIS — R011 Cardiac murmur, unspecified: Secondary | ICD-10-CM | POA: Insufficient documentation

## 2022-03-18 DIAGNOSIS — F32A Depression, unspecified: Secondary | ICD-10-CM | POA: Insufficient documentation

## 2022-03-18 DIAGNOSIS — Z01812 Encounter for preprocedural laboratory examination: Secondary | ICD-10-CM

## 2022-03-18 HISTORY — PX: LYSIS OF ADHESION: SHX5961

## 2022-03-18 HISTORY — PX: CHROMOPERTUBATION: SHX6288

## 2022-03-18 HISTORY — PX: XI ROBOTIC ASSISTED SALPINGECTOMY: SHX6824

## 2022-03-18 LAB — POCT PREGNANCY, URINE: Preg Test, Ur: NEGATIVE

## 2022-03-18 SURGERY — SALPINGECTOMY, ROBOT-ASSISTED
Anesthesia: General | Site: Vagina

## 2022-03-18 MED ORDER — DEXAMETHASONE SODIUM PHOSPHATE 10 MG/ML IJ SOLN
INTRAMUSCULAR | Status: DC | PRN
  Administered 2022-03-18: 10 mg via INTRAVENOUS

## 2022-03-18 MED ORDER — FENTANYL CITRATE (PF) 100 MCG/2ML IJ SOLN
25.0000 ug | INTRAMUSCULAR | Status: DC | PRN
  Administered 2022-03-18: 50 ug via INTRAVENOUS
  Administered 2022-03-18 (×2): 25 ug via INTRAVENOUS
  Administered 2022-03-18: 50 ug via INTRAVENOUS

## 2022-03-18 MED ORDER — PHENYLEPHRINE HCL (PRESSORS) 10 MG/ML IV SOLN
INTRAVENOUS | Status: DC | PRN
  Administered 2022-03-18 (×3): 80 ug via INTRAVENOUS

## 2022-03-18 MED ORDER — OXYCODONE HCL 5 MG PO TABS
ORAL_TABLET | ORAL | Status: AC
  Filled 2022-03-18: qty 1

## 2022-03-18 MED ORDER — SODIUM CHLORIDE FLUSH 0.9 % IV SOLN
INTRAVENOUS | Status: AC
  Filled 2022-03-18: qty 10

## 2022-03-18 MED ORDER — ORAL CARE MOUTH RINSE: 15.0000 mL | Freq: Once | OROMUCOSAL | Status: AC

## 2022-03-18 MED ORDER — ONDANSETRON HCL 4 MG/2ML IJ SOLN
INTRAMUSCULAR | Status: DC | PRN
  Administered 2022-03-18: 4 mg via INTRAVENOUS

## 2022-03-18 MED ORDER — LACTATED RINGERS IV SOLN: INTRAVENOUS | Status: DC

## 2022-03-18 MED ORDER — FAMOTIDINE 20 MG PO TABS: 20.0000 mg | ORAL_TABLET | Freq: Once | ORAL | Status: AC

## 2022-03-18 MED ORDER — FAMOTIDINE 20 MG PO TABS
ORAL_TABLET | ORAL | Status: AC
  Administered 2022-03-18: 20 mg via ORAL
  Filled 2022-03-18: qty 1

## 2022-03-18 MED ORDER — SUGAMMADEX SODIUM 200 MG/2ML IV SOLN
INTRAVENOUS | Status: DC | PRN
  Administered 2022-03-18 (×2): 200 mg via INTRAVENOUS

## 2022-03-18 MED ORDER — BUPIVACAINE HCL (PF) 0.5 % IJ SOLN
INTRAMUSCULAR | Status: AC
  Filled 2022-03-18: qty 30

## 2022-03-18 MED ORDER — OXYCODONE HCL 5 MG/5ML PO SOLN: 5.0000 mg | Freq: Once | ORAL | Status: AC | PRN

## 2022-03-18 MED ORDER — LIDOCAINE HCL (PF) 2 % IJ SOLN
INTRAMUSCULAR | Status: AC
  Filled 2022-03-18: qty 5

## 2022-03-18 MED ORDER — KETAMINE HCL 10 MG/ML IJ SOLN
INTRAMUSCULAR | Status: DC | PRN
  Administered 2022-03-18: 30 mg via INTRAVENOUS
  Administered 2022-03-18 (×2): 10 mg via INTRAVENOUS

## 2022-03-18 MED ORDER — BUPIVACAINE HCL 0.5 % IJ SOLN
INTRAMUSCULAR | Status: DC | PRN
  Administered 2022-03-18: 10 mL
  Administered 2022-03-18: 20 mL

## 2022-03-18 MED ORDER — ROCURONIUM BROMIDE 100 MG/10ML IV SOLN
INTRAVENOUS | Status: DC | PRN
  Administered 2022-03-18: 20 mg via INTRAVENOUS
  Administered 2022-03-18: 80 mg via INTRAVENOUS
  Administered 2022-03-18: 10 mg via INTRAVENOUS
  Administered 2022-03-18: 20 mg via INTRAVENOUS

## 2022-03-18 MED ORDER — CEFAZOLIN SODIUM-DEXTROSE 2-4 GM/100ML-% IV SOLN
2.0000 g | INTRAVENOUS | Status: AC
  Administered 2022-03-18: 2 g via INTRAVENOUS

## 2022-03-18 MED ORDER — MIDAZOLAM HCL 2 MG/2ML IJ SOLN
INTRAMUSCULAR | Status: AC
  Filled 2022-03-18: qty 2

## 2022-03-18 MED ORDER — LIDOCAINE HCL (CARDIAC) PF 100 MG/5ML IV SOSY
PREFILLED_SYRINGE | INTRAVENOUS | Status: DC | PRN
  Administered 2022-03-18: 80 mg via INTRAVENOUS

## 2022-03-18 MED ORDER — ONDANSETRON HCL 4 MG/2ML IJ SOLN
INTRAMUSCULAR | Status: AC
  Filled 2022-03-18: qty 2

## 2022-03-18 MED ORDER — CHLORHEXIDINE GLUCONATE 0.12 % MT SOLN
OROMUCOSAL | Status: AC
  Administered 2022-03-18: 15 mL via OROMUCOSAL
  Filled 2022-03-18: qty 15

## 2022-03-18 MED ORDER — GABAPENTIN 300 MG PO CAPS
ORAL_CAPSULE | ORAL | Status: AC
  Administered 2022-03-18: 300 mg via ORAL
  Filled 2022-03-18: qty 1

## 2022-03-18 MED ORDER — OXYCODONE HCL 5 MG PO TABS: 5.0000 mg | ORAL_TABLET | Freq: Three times a day (TID) | ORAL | 0 refills | Status: DC | PRN

## 2022-03-18 MED ORDER — METHYLENE BLUE 1 % INJ SOLN
INTRAVENOUS | Status: AC
  Filled 2022-03-18: qty 10

## 2022-03-18 MED ORDER — ROCURONIUM BROMIDE 10 MG/ML (PF) SYRINGE
PREFILLED_SYRINGE | INTRAVENOUS | Status: AC
  Filled 2022-03-18: qty 10

## 2022-03-18 MED ORDER — FENTANYL CITRATE (PF) 100 MCG/2ML IJ SOLN
INTRAMUSCULAR | Status: AC
  Filled 2022-03-18: qty 2

## 2022-03-18 MED ORDER — PROPOFOL 10 MG/ML IV BOLUS
INTRAVENOUS | Status: AC
  Filled 2022-03-18: qty 20

## 2022-03-18 MED ORDER — DEXAMETHASONE SODIUM PHOSPHATE 10 MG/ML IJ SOLN
INTRAMUSCULAR | Status: AC
  Filled 2022-03-18: qty 1

## 2022-03-18 MED ORDER — SOD CITRATE-CITRIC ACID 500-334 MG/5ML PO SOLN: 30.0000 mL | ORAL | Status: DC

## 2022-03-18 MED ORDER — HEMOSTATIC AGENTS (NO CHARGE) OPTIME
TOPICAL | Status: DC | PRN
  Administered 2022-03-18: 1 via TOPICAL

## 2022-03-18 MED ORDER — 0.9 % SODIUM CHLORIDE (POUR BTL) OPTIME
TOPICAL | Status: DC | PRN
  Administered 2022-03-18: 500 mL

## 2022-03-18 MED ORDER — SIMETHICONE 80 MG PO CHEW: 80.0000 mg | CHEWABLE_TABLET | Freq: Four times a day (QID) | ORAL | 1 refills | Status: DC | PRN

## 2022-03-18 MED ORDER — OXYCODONE HCL 5 MG PO TABS
5.0000 mg | ORAL_TABLET | Freq: Once | ORAL | Status: AC | PRN
  Administered 2022-03-18: 5 mg via ORAL

## 2022-03-18 MED ORDER — FENTANYL CITRATE (PF) 100 MCG/2ML IJ SOLN
INTRAMUSCULAR | Status: DC | PRN
  Administered 2022-03-18 (×2): 50 ug via INTRAVENOUS

## 2022-03-18 MED ORDER — METHYLENE BLUE 1 % INJ SOLN
INTRAVENOUS | Status: DC | PRN
  Administered 2022-03-18: 10 mL

## 2022-03-18 MED ORDER — IBUPROFEN 800 MG PO TABS: 800.0000 mg | ORAL_TABLET | Freq: Three times a day (TID) | ORAL | 1 refills | Status: DC | PRN

## 2022-03-18 MED ORDER — GABAPENTIN 300 MG PO CAPS: 300.0000 mg | ORAL_CAPSULE | ORAL | Status: AC

## 2022-03-18 MED ORDER — MIDAZOLAM HCL 2 MG/2ML IJ SOLN
INTRAMUSCULAR | Status: DC | PRN
  Administered 2022-03-18: 2 mg via INTRAVENOUS

## 2022-03-18 MED ORDER — KETAMINE HCL 50 MG/5ML IJ SOSY
PREFILLED_SYRINGE | INTRAMUSCULAR | Status: AC
  Filled 2022-03-18: qty 5

## 2022-03-18 MED ORDER — CEFAZOLIN SODIUM-DEXTROSE 2-4 GM/100ML-% IV SOLN
INTRAVENOUS | Status: AC
  Filled 2022-03-18: qty 100

## 2022-03-18 MED ORDER — SODIUM CHLORIDE 0.9 % IR SOLN
Status: DC | PRN
  Administered 2022-03-18: 1000 mL

## 2022-03-18 MED ORDER — CHLORHEXIDINE GLUCONATE 0.12 % MT SOLN: 15.0000 mL | Freq: Once | OROMUCOSAL | Status: AC

## 2022-03-18 MED ORDER — SODIUM CHLORIDE (PF) 0.9 % IJ SOLN
INTRAMUSCULAR | Status: AC
  Filled 2022-03-18: qty 50

## 2022-03-18 MED ORDER — PROPOFOL 10 MG/ML IV BOLUS
INTRAVENOUS | Status: DC | PRN
  Administered 2022-03-18: 150 mg via INTRAVENOUS

## 2022-03-18 MED ORDER — OXYCODONE HCL 5 MG PO TABS
5.0000 mg | ORAL_TABLET | Freq: Once | ORAL | Status: AC
  Administered 2022-03-18: 5 mg via ORAL

## 2022-03-18 SURGICAL SUPPLY — 91 items
ADH SKN CLS APL DERMABOND .7 (GAUZE/BANDAGES/DRESSINGS) ×3
APL SRG 38 LTWT LNG FL B (MISCELLANEOUS) ×3
APPLICATOR ARISTA FLEXITIP XL (MISCELLANEOUS) ×1 IMPLANT
BAG DRN RND TRDRP ANRFLXCHMBR (UROLOGICAL SUPPLIES) ×3
BAG URINE DRAIN 2000ML AR STRL (UROLOGICAL SUPPLIES) ×4 IMPLANT
BARRIER ADHS 3X4 INTERCEED (GAUZE/BANDAGES/DRESSINGS) ×2 IMPLANT
BLADE SURG SZ11 CARB STEEL (BLADE) ×4 IMPLANT
BRR ADH 4X3 ABS CNTRL BYND (GAUZE/BANDAGES/DRESSINGS) ×6
CANNULA CAP OBTURATR AIRSEAL 8 (CAP) ×4 IMPLANT
CANNULA DILATOR 5 W/SLV (CANNULA) IMPLANT
CATH FOLEY 2WAY  5CC 16FR (CATHETERS) ×1
CATH FOLEY 2WAY 5CC 16FR (CATHETERS) ×3
CATH URTH 16FR FL 2W BLN LF (CATHETERS) ×3 IMPLANT
COVER TIP SHEARS 8 DVNC (MISCELLANEOUS) ×3 IMPLANT
COVER TIP SHEARS 8MM DA VINCI (MISCELLANEOUS) ×1
COVER WAND RF STERILE (DRAPES) ×4 IMPLANT
DEFOGGER SCOPE WARMER CLEARIFY (MISCELLANEOUS) ×4 IMPLANT
DERMABOND ADVANCED (GAUZE/BANDAGES/DRESSINGS) ×1
DERMABOND ADVANCED .7 DNX12 (GAUZE/BANDAGES/DRESSINGS) ×3 IMPLANT
DRAPE 3/4 80X56 (DRAPES) ×8 IMPLANT
DRAPE ARM DVNC X/XI (DISPOSABLE) ×9 IMPLANT
DRAPE COLUMN DVNC XI (DISPOSABLE) ×3 IMPLANT
DRAPE DA VINCI XI ARM (DISPOSABLE) ×3
DRAPE DA VINCI XI COLUMN (DISPOSABLE) ×1
DRAPE ROBOT W/ LEGGING 30X125 (DRAPES) ×4 IMPLANT
DRSG TELFA 4X3 1S NADH ST (GAUZE/BANDAGES/DRESSINGS) ×1 IMPLANT
ELECT REM PT RETURN 9FT ADLT (ELECTROSURGICAL) ×4
ELECTRODE REM PT RTRN 9FT ADLT (ELECTROSURGICAL) ×3 IMPLANT
GAUZE 4X4 16PLY ~~LOC~~+RFID DBL (SPONGE) ×8 IMPLANT
GLOVE BIO SURGEON STRL SZ 6.5 (GLOVE) ×16 IMPLANT
GLOVE SURG POLY ORTHO LF SZ7.5 (GLOVE) IMPLANT
GLOVE SURG UNDER LTX SZ7 (GLOVE) ×16 IMPLANT
GOWN STRL REUS W/ TWL LRG LVL3 (GOWN DISPOSABLE) ×12 IMPLANT
GOWN STRL REUS W/TWL LRG LVL3 (GOWN DISPOSABLE) ×16
GRASPER SUT TROCAR 14GX15 (MISCELLANEOUS) ×4 IMPLANT
GYRUS RUMI II 2.5CM BLUE (DISPOSABLE)
GYRUS RUMI II 3.5CM BLUE (DISPOSABLE)
HEMOSTAT ARISTA ABSORB 3G PWDR (HEMOSTASIS) ×1 IMPLANT
IRRIGATION STRYKERFLOW (MISCELLANEOUS) IMPLANT
IRRIGATOR STRYKERFLOW (MISCELLANEOUS)
IV NS 1000ML (IV SOLUTION) ×4
IV NS 1000ML BAXH (IV SOLUTION) ×3 IMPLANT
KIT IMAGING PINPOINTPAQ (MISCELLANEOUS) IMPLANT
KIT PINK PAD W/HEAD ARE REST (MISCELLANEOUS) ×4
KIT PINK PAD W/HEAD ARM REST (MISCELLANEOUS) ×3 IMPLANT
LABEL OR SOLS (LABEL) ×4 IMPLANT
MANIFOLD NEPTUNE II (INSTRUMENTS) ×4 IMPLANT
MANIPULATOR UTERINE 4.5 ZUMI (MISCELLANEOUS) ×1 IMPLANT
NEEDLE VERESS 14GA 120MM (NEEDLE) ×4 IMPLANT
NS IRRIG 1000ML POUR BTL (IV SOLUTION) ×4 IMPLANT
OBTURATOR OPTICAL STANDARD 8MM (TROCAR) ×1
OBTURATOR OPTICAL STND 8 DVNC (TROCAR) ×3
OBTURATOR OPTICALSTD 8 DVNC (TROCAR) ×3 IMPLANT
OCCLUDER COLPOPNEUMO (BALLOONS) ×4 IMPLANT
PACK GYN LAPAROSCOPIC (MISCELLANEOUS) ×4 IMPLANT
PAD OB MATERNITY 4.3X12.25 (PERSONAL CARE ITEMS) ×4 IMPLANT
RUMI II 3.0CM BLUE KOH-EFFICIE (DISPOSABLE) IMPLANT
RUMI II GYRUS 2.5CM BLUE (DISPOSABLE) IMPLANT
RUMI II GYRUS 3.5CM BLUE (DISPOSABLE) IMPLANT
SCISSORS METZENBAUM CVD 33 (INSTRUMENTS) ×4 IMPLANT
SCRUB CHG 4% DYNA-HEX 4OZ (MISCELLANEOUS) ×4 IMPLANT
SEAL CANN UNIV 5-8 DVNC XI (MISCELLANEOUS) ×9 IMPLANT
SEAL XI 5MM-8MM UNIVERSAL (MISCELLANEOUS) ×3
SEALER VESSEL DA VINCI XI (MISCELLANEOUS)
SEALER VESSEL EXT DVNC XI (MISCELLANEOUS) IMPLANT
SET CYSTO W/LG BORE CLAMP LF (SET/KITS/TRAYS/PACK) IMPLANT
SET TUBE FILTERED XL AIRSEAL (SET/KITS/TRAYS/PACK) ×4 IMPLANT
SHEARS HARMONIC ACE PLUS 36CM (ENDOMECHANICALS) ×1 IMPLANT
SOL PREP PVP 2OZ (MISCELLANEOUS) ×4
SOLUTION ELECTROLUBE (MISCELLANEOUS) ×4 IMPLANT
SOLUTION PREP PVP 2OZ (MISCELLANEOUS) ×3 IMPLANT
SURGILUBE 2OZ TUBE FLIPTOP (MISCELLANEOUS) ×4 IMPLANT
SUT DVC VLOC 180 0 12IN GS21 (SUTURE) ×4
SUT MNCRL 4-0 (SUTURE) ×8
SUT MNCRL 4-0 27XMFL (SUTURE) ×6
SUT VIC AB 2-0 CT1 27 (SUTURE) ×4
SUT VIC AB 2-0 CT1 TAPERPNT 27 (SUTURE) ×3 IMPLANT
SUT VICRYL 0 AB UR-6 (SUTURE) ×4 IMPLANT
SUTURE DVC VLC 180 0 12IN GS21 (SUTURE) ×3 IMPLANT
SUTURE MNCRL 4-0 27XMF (SUTURE) ×3 IMPLANT
SYR 10ML LL (SYRINGE) ×4 IMPLANT
SYR 50ML LL SCALE MARK (SYRINGE) ×4 IMPLANT
TIP RUMI ORANGE 6.7MMX12CM (TIP) IMPLANT
TIP UTERINE 5.1X6CM LAV DISP (MISCELLANEOUS) IMPLANT
TIP UTERINE 6.7X10CM GRN DISP (MISCELLANEOUS) IMPLANT
TIP UTERINE 6.7X6CM WHT DISP (MISCELLANEOUS) IMPLANT
TIP UTERINE 6.7X8CM BLUE DISP (MISCELLANEOUS) IMPLANT
TROCAR XCEL NON-BLD 5MMX100MML (ENDOMECHANICALS) ×1 IMPLANT
TROCAR Z-THRD FIOS HNDL 11X100 (TROCAR) ×4 IMPLANT
TUBING EVAC SMOKE HEATED PNEUM (TUBING) IMPLANT
WATER STERILE IRR 500ML POUR (IV SOLUTION) ×4 IMPLANT

## 2022-03-18 NOTE — Discharge Instructions (Signed)
AMBULATORY SURGERY  ?DISCHARGE INSTRUCTIONS ? ? ?The drugs that you were given will stay in your system until tomorrow so for the next 24 hours you should not: ? ?Drive an automobile ?Make any legal decisions ?Drink any alcoholic beverage ? ? ?You may resume regular meals tomorrow.  Today it is better to start with liquids and gradually work up to solid foods. ? ?You may eat anything you prefer, but it is better to start with liquids, then soup and crackers, and gradually work up to solid foods. ? ? ?Please notify your doctor immediately if you have any unusual bleeding, trouble breathing, redness and pain at the surgery site, drainage, fever, or pain not relieved by medication. ? ? ? ?Additional Instructions: ? ? ? ?Please contact your physician with any problems or Same Day Surgery at [phone], Monday through Friday 6 am to 4 pm, or [place] [facility] number at [phone].  ?

## 2022-03-18 NOTE — Anesthesia Preprocedure Evaluation (Signed)
Anesthesia Evaluation  Patient identified by MRN, date of birth, ID band Patient awake    Reviewed: Allergy & Precautions, NPO status , Patient's Chart, lab work & pertinent test results  History of Anesthesia Complications Negative for: history of anesthetic complications  Airway Mallampati: III  TM Distance: >3 FB Neck ROM: full  Mouth opening: Limited Mouth Opening  Dental  (+) Teeth Intact   Pulmonary neg pulmonary ROS, Current Smoker,    Pulmonary exam normal        Cardiovascular (-) angina(-) CAD and (-) Past MI Normal cardiovascular exam+ Valvular Problems/Murmurs      Neuro/Psych PSYCHIATRIC DISORDERS negative neurological ROS     GI/Hepatic negative GI ROS, Neg liver ROS,   Endo/Other  negative endocrine ROS  Renal/GU      Musculoskeletal   Abdominal   Peds  Hematology negative hematology ROS (+)   Anesthesia Other Findings Past Medical History: No date: Allergy No date: Anxiety No date: Chicken pox No date: Depression No date: Heart murmur No date: Lyme disease     Comment:  patient reports a remote history of lyme and alludes               that she has "chronic lyme" No date: Migraines     Comment:  MIGRAINES 07/2017: Pneumonia  Past Surgical History: 02/16/2018: CHROMOPERTUBATION     Comment:  Procedure: CHROMOPERTUBATION;  Surgeon: Schermerhorn,               Gwen Her, MD;  Location: [facility];  Service: Gynecology;; No date: CYSTECTOMY; Left     Comment:  OVARY 2008: DILATION AND CURETTAGE OF UTERUS 02/16/2018: LAPAROSCOPIC LYSIS OF ADHESIONS; N/A     Comment:  Procedure: EXTENSIVE LAPAROSCOPIC LYSIS OF ADHESIONS;                Surgeon: Boykin Nearing, MD;  Location: [facility];              Service: Gynecology;  Laterality: N/A; 02/16/2018: LAPAROSCOPY; N/A     Comment:  Procedure: LAPAROSCOPY OPERATIVE;  Surgeon:               Boykin Nearing, MD;  Location: [facility];                 Service: Gynecology;  Laterality: N/A;  BMI    Body Mass Index: 35.62 kg/m      Reproductive/Obstetrics negative OB ROS                             Anesthesia Physical Anesthesia Plan  ASA: 2  Anesthesia Plan: General ETT   Post-op Pain Management:    Induction: Intravenous  PONV Risk Score and Plan: 3 and Ondansetron, Dexamethasone, Midazolam and Treatment may vary due to age or medical condition  Airway Management Planned: Oral ETT  Additional Equipment:   Intra-op Plan:   Post-operative Plan: Extubation in OR  Informed Consent: I have reviewed the patients History and Physical, chart, labs and discussed the procedure including the risks, benefits and alternatives for the proposed anesthesia with the patient or authorized representative who has indicated his/her understanding and acceptance.     Dental Advisory Given  Plan Discussed with: Anesthesiologist, CRNA and Surgeon  Anesthesia Plan Comments: (Patient consented for risks of anesthesia including but not limited to:  - adverse reactions to medications - damage to eyes, teeth, lips or other oral mucosa - nerve damage due  to positioning  - sore throat or hoarseness - Damage to heart, brain, nerves, lungs, other parts of body or loss of life  Patient voiced understanding.)        Anesthesia Quick Evaluation

## 2022-03-18 NOTE — H&P (Signed)
GYNECOLOGY PREOPERATIVE HISTORY AND PHYSICAL   Subjective:  Lacey Harrison is a 36 y.o. G1P0010 here for surgical management of evaluation of Infertility, irregular periods, history of hydrosalpinx, and pelvic pain (mostly left-sided).  Significant preoperative concerns: pelvic adhesions leading to bladder injury in a prior surgery.  Has had h/o chromotubation procedure in 2019, with spillage noted from right tube.   Proposed surgery: Robotic adhesiolysis, left salpingectomy, chromotubation.    Pertinent Gynecological History: Menses: Irregular menses. Patient's last menstrual period was 02/25/2022 (approximate). Contraception:  none Last pap: normal Date: 02/15/2021   Past Medical History:  Diagnosis Date   Allergy    Anxiety    Chicken pox    Depression    Heart murmur    Lyme disease    patient reports a remote history of lyme and alludes that she has "chronic lyme"   Migraines    MIGRAINES   Pneumonia 07/2017    Past Surgical History:  Procedure Laterality Date   CHROMOPERTUBATION  02/16/2018   Procedure: CHROMOPERTUBATION;  Surgeon: Schermerhorn, Gwen Her, MD;  Location: [facility];  Service: Gynecology;;   CYSTECTOMY Left    OVARY   DILATION AND CURETTAGE OF UTERUS  2008   LAPAROSCOPIC LYSIS OF ADHESIONS N/A 02/16/2018   Procedure: EXTENSIVE LAPAROSCOPIC LYSIS OF ADHESIONS;  Surgeon: Boykin Nearing, MD;  Location: [facility];  Service: Gynecology;  Laterality: N/A;   LAPAROSCOPY N/A 02/16/2018   Procedure: LAPAROSCOPY OPERATIVE;  Surgeon: Schermerhorn, Gwen Her, MD;  Location: [facility];  Service: Gynecology;  Laterality: N/A;   OB History  Gravida Para Term Preterm AB Living  1       1    SAB IAB Ectopic Multiple Live Births      1        # Outcome Date GA Lbr Len/2nd Weight Sex Delivery Anes PTL Lv  1 Ectopic             Family History  Problem Relation Age of Onset   Mental illness Mother    Mental illness Father    Mental illness Maternal  Grandmother    Mental illness Paternal Grandmother     Social History   Socioeconomic History   Marital status: Married    Spouse name: Thurmond Butts   Number of children: Not on file   Years of education: Not on file   Highest education level: Not on file  Occupational History   Not on file  Tobacco Use   Smoking status: Some Days    Packs/day: 0.75    Years: 14.00    Total pack years: 10.50    Types: Cigarettes    Last attempt to quit: 07/05/2016    Years since quitting: 5.7   Smokeless tobacco: Never  Vaping Use   Vaping Use: Former  Substance and Sexual Activity   Alcohol use: Not Currently    Alcohol/week: 11.0 standard drinks of alcohol    Types: 3 Glasses of wine, 2 Cans of beer, 3 Shots of liquor, 3 Standard drinks or equivalent per week    Comment: almost daily   Drug use: No   Sexual activity: Yes    Birth control/protection: None  Other Topics Concern   Not on file  Social History Narrative   Not on file   Social Determinants of Health   Financial Resource Strain: Not on file  Food Insecurity: Not on file  Transportation Needs: Not on file  Physical Activity: Not on file  Stress: Not  on file  Social Connections: Not on file  Intimate Partner Violence: Not on file    No current facility-administered medications on file prior to encounter.   Current Outpatient Medications on File Prior to Encounter  Medication Sig Dispense Refill   albuterol (VENTOLIN HFA) 108 (90 Base) MCG/ACT inhaler Inhale 1-2 puffs into the lungs every 6 (six) hours as needed for wheezing or shortness of breath. 1 each 0    Allergies  Allergen Reactions   Tylenol [Acetaminophen] Other (See Comments)    Lingering Taste     Review of Systems Constitutional: No recent fever/chills/sweats Respiratory: No recent cough/bronchitis Cardiovascular: No chest pain Gastrointestinal: No recent nausea/vomiting/diarrhea Genitourinary: No UTI symptoms Hematologic/lymphatic:No history of  coagulopathy or recent blood thinner use    Objective:   Blood pressure 111/78, pulse 89, temperature (!) 97.3 F (36.3 C), temperature source Temporal, resp. rate 16, weight 91.2 kg, last menstrual period 02/25/2022, SpO2 97 %. CONSTITUTIONAL: Well-developed, well-nourished female in no acute distress.  HENT:  Normocephalic, atraumatic, External right and left ear normal. Oropharynx is clear and moist EYES: Conjunctivae and EOM are normal. Pupils are equal, round, and reactive to light. No scleral icterus.  NECK: Normal range of motion, supple, no masses SKIN: Skin is warm and dry. No rash noted. Not diaphoretic. No erythema. No pallor. NEUROLOGIC: Alert and oriented to person, place, and time. Normal reflexes, muscle tone coordination. No cranial nerve deficit noted. PSYCHIATRIC: Normal mood and affect. Normal behavior. Normal judgment and thought content. CARDIOVASCULAR: Normal heart rate noted, regular rhythm RESPIRATORY: Effort and breath sounds normal, no problems with respiration noted ABDOMEN: Soft, nontender, nondistended. PELVIC: Deferred MUSCULOSKELETAL: Normal range of motion. No edema and no tenderness. 2+ distal pulses.   Labs: Results for orders placed or performed during the hospital encounter of 03/18/22 (from the past 336 hour(s))  Pregnancy, urine POC   Collection Time: 03/18/22  7:38 AM  Result Value Ref Range   Preg Test, Ur NEGATIVE NEGATIVE  Results for orders placed or performed during the hospital encounter of 03/11/22 (from the past 336 hour(s))  CBC   Collection Time: 03/11/22  3:42 PM  Result Value Ref Range   WBC 9.1 4.0 - 10.5 K/uL   RBC 5.35 (H) 3.87 - 5.11 MIL/uL   Hemoglobin 15.4 (H) 12.0 - 15.0 g/dL   HCT 44.9 36.0 - 46.0 %   MCV 83.9 80.0 - 100.0 fL   MCH 28.8 26.0 - 34.0 pg   MCHC 34.3 30.0 - 36.0 g/dL   RDW 12.3 11.5 - 15.5 %   Platelets 321 150 - 400 K/uL   nRBC 0.0 0.0 - 0.2 %  Type and screen   Collection Time: 03/11/22  3:42 PM   Result Value Ref Range   ABO/RH(D) A POS    Antibody Screen NEG    Sample Expiration 03/25/2022,2359    Extend sample reason      NO TRANSFUSIONS OR PREGNANCY IN THE PAST 3 MONTHS Performed at [facility], [address] [place], [place] [address]      Imaging Studies: US PELVIC COMPLETE WITH TRANSVAGINAL Patient Name: DANNICA BICKHAM DOB: [birth year] MRN: [id number]  ULTRASOUND REPORT Location: [facility]  Date of Service: 01/08/2022   Indications:Infertility, irregular periods, Hx Hydrosalpinx, Hx Rt ov cyst Findings:  The uterus is anteverted and measures 7.8 x 3.2 x 3.6 cm. Echo texture is heterogenous without evidence of focal masses.  The Endometrium measures 11 mm.  In the left adnexa, there  is a > 12 cm structure with Fluid/Fluid levels  that is presumed to arise from the Left Fallopian tube, based on patient's  history; otherwise unable to characterize due to it's size and patient's  severe pain with scanning.  Ovaries are not visualized.  Impression: 1. Left adnexal cystic mass.  Recommendations: 1.Clinical correlation with the patient's History and Physical Exam. 2. Findings discussed with Dr Marcelline Mates.  Vivien Rota  [place]  I have reviewed this study and agree with documented findings. Persistent  suspected left hydrosalpinx visualized.   Rubie Maid, MD [facility]    Assessment:    1. Hydrosalpinx   2. At risk for fertility problems   3. Pre-operative laboratory examination   4. Pelvic adhesive disease      Plan:   - Counseling: Procedure, risks, reasons, benefits and complications (including injury to bowel, bladder, major blood vessel, ureter, bleeding, possibility of transfusion, infection, or fistula formation) reviewed in detail. Discussed possibility of loss of other tube if appears to be diseased or non-functional. Likelihood of success in alleviating the patient's condition was discussed. Routine  postoperative instructions will be reviewed with the patient and her family in detail after surgery.  The patient concurred with the proposed plan, giving informed written consent for the surgery.   - Preop testing reviewed, no concerns. - Has been NPO since midnight.    Rubie Maid, MD [facility]

## 2022-03-18 NOTE — Progress Notes (Signed)
Pt started bleeding from the bellybutton when she sat on the toilet. MD Marcelline Mates was notified via secured chat and was asked if she wanted to see her; got orders to put a 2x2 and a Tagaderm on bellybutton to see if it would bleed thru the dressing. Pt ambulated and sat on the toilet to void, dressing stayed dry and intact. Pt was discharged home and was instructed to call if she starts bleeding or if any concerns. Continue to monitor.

## 2022-03-18 NOTE — Transfer of Care (Signed)
Immediate Anesthesia Transfer of Care Note  Patient: Lacey Harrison  Procedure(s) Performed: XI ROBOTIC ASSISTED SALPINGECTOMY (Left: Abdomen) LYSIS OF ADHESION (Abdomen) CHROMOPERTUBATION (Left: Vagina )  Patient Location: PACU  Anesthesia Type:General  Level of Consciousness: drowsy  Airway & Oxygen Therapy: Patient Spontanous Breathing and Patient connected to face mask oxygen  Post-op Assessment: Report given to RN and Post -op Vital signs reviewed and stable  Post vital signs: Reviewed and stable  Last Vitals:  Vitals Value Taken Time  BP 118/80 03/18/22 1235  Temp    Pulse 88 03/18/22 1238  Resp 22 03/18/22 1238  SpO2 100 % 03/18/22 1238  Vitals shown include unvalidated device data.  Last Pain:  Vitals:   03/18/22 0740  TempSrc: Temporal  PainSc: 0-No pain         Complications: No notable events documented.

## 2022-03-18 NOTE — Op Note (Addendum)
Procedure(s): XI ROBOTIC ASSISTED SALPINGECTOMY LYSIS OF ADHESION CHROMOPERTUBATION Procedure Note  Lacey Harrison female 36 y.o. 03/18/2022  Indications: The patient is a 36 y.o. G66P0010 female with infertility, pelvic pain, left hydrosalpinx, history of abdominal-pelvic adhesive disease  Pre-operative Diagnosis:  Infertility, pelvic pain, left hydrosalpinx, history of abdominal-pelvic adhesive disease  Post-operative Diagnosis: Same with left peritoneal cyst, suspected endometriosis  Surgeon: Rubie Maid, MD  Assistants:  Jeannie Fend, MD.   Anesthesia: General endotracheal anesthesia  Findings: 1) The uterus was sounded to 8 cm, cervix appeared normal. Mild cervical stenosis present.  2) Dense adhesions of the omentum to the anterior abdominal wall.  3) Adhesions of small bowel to left pelvic sidewall and bilateral adnexae.  4) Ovaries unable to visualized bilaterally, left ovary obscured by large peritoneal cyst/hydrosalpinx, right ovary obscured by adhesions to posterior surface of the uterus and bowel.  5) Large peritoneal cyst along left pelvic sidewall involving left fallopian tube and ovary, approximately 500 cc of serous fluid drained 6)  Posterior cul-de-sac partially obliterated due to adhesions of posterior surface of uterus to bowel. Powder-burn lesions noted in posterior cul-de-sac and right uterosacral ligament.  A peritoneal window was also identified in the posterior cul-de-sac. 7) Flow of methylene blue dye through right fallopian tube after adhesiolysis performed (previously obstructed with no flow at start of case)  Procedure Details: The patient was seen in the Holding Room. The risks, benefits, complications, treatment options, and expected outcomes were discussed with the patient.  The patient concurred with the proposed plan, giving informed consent.  The site of surgery properly noted/marked. The patient was taken to the Operating Room, identified as Lacey Harrison and the procedure verified as Procedure(s) (LRB): XI ROBOTIC ASSISTED SALPINGECTOMY (Left), LYSIS OF ADHESION (N/A), CHROMOPERTUBATION (Left).   She was then placed under general anesthesia without difficulty. She was placed in the dorsal lithotomy position, and was prepped and draped in a sterile manner.  A Time Out was held and the above information confirmed.  A foley catheter was placed.  A sterile speculum was inserted into the vagina and the cervix was grasped at the anterior lip using a single-toothed tenaculum.  The uterus was sounded to 8 cm, and a Humi uterine manipulator was placed. The speculum and tenaculum were then removed.   Attention was turned to the abdomen where an umbilical incision was made with the scalpel.  An 8 mm trochar was inserted under direct visualization using the 5-mm laparoscopic camera.    Three additional ports were then placed. There were two 8 mm ports that were placed 10 cm laterally to the umbilicus and 2 cm inferiorly on either side.  The 5- mm assistant port was then placed in the upper right quadrant, ~  4 cm superior and lateral to the umbilical port.   All incisions were injected with local anesthetic (Sensorcaine 0.5%, total of 20 cc) prior to port placement. There were dense adhesions of the omentum to the anterior wall, partially obstructing visualization of the pelvis and the left robotic trochar. Adhesiolysis was performed using the Harmonic scalpel. Approximately 20 minutes of total operating time was spent at this point performing adhesiolysis.   The patient was placed in steep Trendelenburg positioning.  The daVinci robot was then docked in the normal fashion.   Inspection of the pelvis showed the findings noted above. Chromotubation was attempted with injection of approixmately 50 cc of diluted methylene blue solution, no spillage of the dye was identified from either  fallopian tube.  Adhesions of the bowel were noted along the left pelvic  sidewall.  Using blunt and sharp dissection with the monopolar EndoShears, the adhesions were lysed to reveal a large peritoneal wall cyst with adhesions to the fallopian tube and encasing the left ovary.  A small incision was made over the cyst with copious serous fluid present.  The cyst was drained, with a total of 500 cc of serous fluid collected.  After drainage of the cyst fluid, a window was identified and the peritoneum which allowed for access to the posterior cul-de-sac.  This window was opened and further adhesiolysis was performed until the majority of cul-de-sac was able to be visualized.  During this adhesiolysis the ovary was able to be identified and freed of adhesions to the posterior uterus and left pelvic sidewall.  The fallopian tube was then grasped after being freed from adhesions and the mesosalpinx was cauterized and cut using the monopolar EndoShears.  The left fallopian tube was then removed from the abdomen through the assistant port.  Attention was then turned to the right adnexa where there was noted to be adhesions of the small bowel to the posterior surface of the uterus as well as the adnexa.  Further adhesiolysis was performed using sharp and blunt dissection until the right fallopian tube and ovary were freed.  Additional adhesiolysis was performed to completely open the posterior cul-de-sac.  Biopsy forceps were then used to perform biopsy of the peritoneum in the posterior cul-de-sac and along the left uterosacral ligament where powder burn lesions were apparent.  There was also a peritoneal window in the posterior cul-de-sac.  After this, chromotubation was again performed with an additional 30 cc of diluted methylene blue with spillage noted from the right fallopian tube at this time.    Next a survey of the abdomen and pelvis was performed.  There were several areas of oozing noted along the left pelvic sidewall and in the posterior cul-de-sac as well as along the left  fallopian tube and ovary.  These areas were cauterized using the bipolar forceps were possible.  The areas were then covered with Arista hemostatic powder.  Good hemostasis was achieved.  Interceed was then placed in the posterior cul-de-sac and along the areas of bowel where the most significant adhesiolysis was performed.  All trocars were removed under direct visualization, and the abdomen which was desufflated.    All skin incisions were closed with 4-0 Monocryl subcuticular stitches and covered with Dermabond. The Humi uterine manipulator was then removed from the uterus.  The Foley catheter was then removed.  The patient tolerated the procedures well.  All instruments, needles, and sponge counts were correct x 2. The patient was taken to the recovery room awake, extubated and in stable condition.   An experienced assistant was required given the standard of surgical care given the complexity of the case.  This assistant was needed for exposure, dissection, suctioning, instrument exchange, and for overall help during the procedure.  Total case time was approximately 2 hr 20 minutes, with >75% of the case spent performing adhesiolysis.    Estimated Blood Loss:  30 ml      Drains: foley catheter to gravity drainage, with 300 ml of clear urine at end of the procedure.         Total IV Fluids: 1400 ml  Specimens: Left fallopian tube         Implants: None         Complications:  None; patient tolerated the procedure well.         Disposition: PACU - hemodynamically stable.         Condition: stable   Rubie Maid, MD [facility]

## 2022-03-18 NOTE — Anesthesia Procedure Notes (Signed)
Procedure Name: Intubation Date/Time: 03/18/2022 9:19 AM  Performed by: Loni Dolly, CRNAPre-anesthesia Checklist: Patient identified, Emergency Drugs available, Suction available and Patient being monitored Patient Re-evaluated:Patient Re-evaluated prior to induction Oxygen Delivery Method: Circle system utilized Preoxygenation: Pre-oxygenation with 100% oxygen Induction Type: IV induction Ventilation: Mask ventilation without difficulty Laryngoscope Size: McGraph and 3 Grade View: Grade I Tube type: Oral Tube size: 7.0 mm Number of attempts: 1 Airway Equipment and Method: Stylet Placement Confirmation: ETT inserted through vocal cords under direct vision, positive ETCO2 and breath sounds checked- equal and bilateral Secured at: 21 cm Tube secured with: Tape Dental Injury: Teeth and Oropharynx as per pre-operative assessment

## 2022-03-19 ENCOUNTER — Encounter: Payer: Self-pay | Admitting: Obstetrics and Gynecology

## 2022-03-19 LAB — SURGICAL PATHOLOGY

## 2022-03-19 NOTE — Anesthesia Postprocedure Evaluation (Signed)
Anesthesia Post Note  Patient: Lacey Harrison  Procedure(s) Performed: XI ROBOTIC ASSISTED SALPINGECTOMY (Left: Abdomen) LYSIS OF ADHESION (Abdomen) CHROMOPERTUBATION (Left: Vagina )  Patient location during evaluation: PACU Anesthesia Type: General Level of consciousness: awake and alert Pain management: pain level controlled Vital Signs Assessment: post-procedure vital signs reviewed and stable Respiratory status: spontaneous breathing, nonlabored ventilation, respiratory function stable and patient connected to nasal cannula oxygen Cardiovascular status: blood pressure returned to baseline and stable Postop Assessment: no apparent nausea or vomiting Anesthetic complications: no   No notable events documented.   Last Vitals:  Vitals:   03/18/22 1408 03/18/22 1510  BP: 106/72 102/71  Pulse: 70 68  Resp: 18 17  Temp: (!) 36.3 C   SpO2: 100% 95%    Last Pain:  Vitals:   03/19/22 0918  TempSrc:   PainSc: 0-No pain                 Dimas Millin

## 2022-03-21 ENCOUNTER — Inpatient Hospital Stay
Admission: EM | Admit: 2022-03-21 | Discharge: 2022-04-11 | DRG: 856 | Disposition: A | Discharge status: Home Health | Payer: No Typology Code available for payment source | Attending: Internal Medicine | Admitting: Internal Medicine

## 2022-03-21 ENCOUNTER — Other Ambulatory Visit: Payer: Self-pay

## 2022-03-21 ENCOUNTER — Emergency Department: Payer: No Typology Code available for payment source

## 2022-03-21 ENCOUNTER — Ambulatory Visit: Payer: No Typology Code available for payment source | Admitting: Internal Medicine

## 2022-03-21 DIAGNOSIS — Y763 Surgical instruments, materials and obstetric and gynecological devices (including sutures) associated with adverse incidents: Secondary | ICD-10-CM | POA: Diagnosis not present

## 2022-03-21 DIAGNOSIS — J9811 Atelectasis: Secondary | ICD-10-CM | POA: Diagnosis not present

## 2022-03-21 DIAGNOSIS — E871 Hypo-osmolality and hyponatremia: Secondary | ICD-10-CM | POA: Diagnosis present

## 2022-03-21 DIAGNOSIS — A4151 Sepsis due to Escherichia coli [E. coli]: Secondary | ICD-10-CM | POA: Diagnosis present

## 2022-03-21 DIAGNOSIS — R6520 Severe sepsis without septic shock: Secondary | ICD-10-CM | POA: Diagnosis present

## 2022-03-21 DIAGNOSIS — D62 Acute posthemorrhagic anemia: Secondary | ICD-10-CM | POA: Diagnosis present

## 2022-03-21 DIAGNOSIS — K9172 Accidental puncture and laceration of a digestive system organ or structure during other procedure: Secondary | ICD-10-CM | POA: Diagnosis present

## 2022-03-21 DIAGNOSIS — F17210 Nicotine dependence, cigarettes, uncomplicated: Secondary | ICD-10-CM | POA: Diagnosis present

## 2022-03-21 DIAGNOSIS — N809 Endometriosis, unspecified: Secondary | ICD-10-CM | POA: Diagnosis present

## 2022-03-21 DIAGNOSIS — T8143XA Infection following a procedure, organ and space surgical site, initial encounter: Secondary | ICD-10-CM | POA: Diagnosis not present

## 2022-03-21 DIAGNOSIS — K660 Peritoneal adhesions (postprocedural) (postinfection): Secondary | ICD-10-CM | POA: Diagnosis present

## 2022-03-21 DIAGNOSIS — R109 Unspecified abdominal pain: Secondary | ICD-10-CM | POA: Diagnosis not present

## 2022-03-21 DIAGNOSIS — J9601 Acute respiratory failure with hypoxia: Secondary | ICD-10-CM | POA: Diagnosis present

## 2022-03-21 DIAGNOSIS — E875 Hyperkalemia: Secondary | ICD-10-CM | POA: Diagnosis not present

## 2022-03-21 DIAGNOSIS — K659 Peritonitis, unspecified: Secondary | ICD-10-CM | POA: Diagnosis not present

## 2022-03-21 DIAGNOSIS — A419 Sepsis, unspecified organism: Secondary | ICD-10-CM | POA: Insufficient documentation

## 2022-03-21 DIAGNOSIS — K567 Ileus, unspecified: Secondary | ICD-10-CM

## 2022-03-21 DIAGNOSIS — G43909 Migraine, unspecified, not intractable, without status migrainosus: Secondary | ICD-10-CM | POA: Diagnosis present

## 2022-03-21 DIAGNOSIS — Z818 Family history of other mental and behavioral disorders: Secondary | ICD-10-CM | POA: Diagnosis not present

## 2022-03-21 DIAGNOSIS — N926 Irregular menstruation, unspecified: Secondary | ICD-10-CM | POA: Diagnosis present

## 2022-03-21 DIAGNOSIS — E872 Acidosis, unspecified: Secondary | ICD-10-CM | POA: Diagnosis present

## 2022-03-21 DIAGNOSIS — Z886 Allergy status to analgesic agent status: Secondary | ICD-10-CM

## 2022-03-21 DIAGNOSIS — N7011 Chronic salpingitis: Secondary | ICD-10-CM | POA: Diagnosis present

## 2022-03-21 DIAGNOSIS — Y658 Other specified misadventures during surgical and medical care: Secondary | ICD-10-CM | POA: Diagnosis not present

## 2022-03-21 DIAGNOSIS — T8144XA Sepsis following a procedure, initial encounter: Principal | ICD-10-CM | POA: Diagnosis present

## 2022-03-21 DIAGNOSIS — K651 Peritoneal abscess: Secondary | ICD-10-CM | POA: Diagnosis present

## 2022-03-21 DIAGNOSIS — R011 Cardiac murmur, unspecified: Secondary | ICD-10-CM | POA: Diagnosis present

## 2022-03-21 DIAGNOSIS — N979 Female infertility, unspecified: Secondary | ICD-10-CM | POA: Diagnosis present

## 2022-03-21 DIAGNOSIS — E876 Hypokalemia: Secondary | ICD-10-CM | POA: Diagnosis not present

## 2022-03-21 DIAGNOSIS — K560 Paralytic ileus: Secondary | ICD-10-CM | POA: Diagnosis not present

## 2022-03-21 DIAGNOSIS — N739 Female pelvic inflammatory disease, unspecified: Principal | ICD-10-CM

## 2022-03-21 DIAGNOSIS — K668 Other specified disorders of peritoneum: Secondary | ICD-10-CM | POA: Diagnosis present

## 2022-03-21 DIAGNOSIS — K658 Other peritonitis: Secondary | ICD-10-CM | POA: Diagnosis not present

## 2022-03-21 DIAGNOSIS — N179 Acute kidney failure, unspecified: Secondary | ICD-10-CM | POA: Diagnosis present

## 2022-03-21 DIAGNOSIS — K631 Perforation of intestine (nontraumatic): Secondary | ICD-10-CM | POA: Diagnosis not present

## 2022-03-21 DIAGNOSIS — E8809 Other disorders of plasma-protein metabolism, not elsewhere classified: Secondary | ICD-10-CM | POA: Diagnosis not present

## 2022-03-21 DIAGNOSIS — K9189 Other postprocedural complications and disorders of digestive system: Secondary | ICD-10-CM | POA: Diagnosis not present

## 2022-03-21 LAB — CBC WITH DIFFERENTIAL/PLATELET
Abs Immature Granulocytes: 0.1 10*3/uL — ABNORMAL HIGH (ref 0.00–0.07)
Basophils Absolute: 0.1 10*3/uL (ref 0.0–0.1)
Basophils Relative: 0 %
Eosinophils Absolute: 0 10*3/uL (ref 0.0–0.5)
Eosinophils Relative: 0 %
HCT: 44.5 % (ref 36.0–46.0)
Hemoglobin: 15.3 g/dL — ABNORMAL HIGH (ref 12.0–15.0)
Immature Granulocytes: 1 %
Lymphocytes Relative: 8 %
Lymphs Abs: 1.3 10*3/uL (ref 0.7–4.0)
MCH: 29 pg (ref 26.0–34.0)
MCHC: 34.4 g/dL (ref 30.0–36.0)
MCV: 84.3 fL (ref 80.0–100.0)
Monocytes Absolute: 0.8 10*3/uL (ref 0.1–1.0)
Monocytes Relative: 5 %
Neutro Abs: 14.5 10*3/uL — ABNORMAL HIGH (ref 1.7–7.7)
Neutrophils Relative %: 86 %
Platelets: 294 10*3/uL (ref 150–400)
RBC: 5.28 MIL/uL — ABNORMAL HIGH (ref 3.87–5.11)
RDW: 12.3 % (ref 11.5–15.5)
WBC: 16.8 10*3/uL — ABNORMAL HIGH (ref 4.0–10.5)
nRBC: 0 % (ref 0.0–0.2)

## 2022-03-21 LAB — URINALYSIS, ROUTINE W REFLEX MICROSCOPIC
Bacteria, UA: NONE SEEN
Bilirubin Urine: NEGATIVE
Glucose, UA: NEGATIVE mg/dL
Ketones, ur: NEGATIVE mg/dL
Leukocytes,Ua: NEGATIVE
Nitrite: NEGATIVE
Protein, ur: NEGATIVE mg/dL
RBC / HPF: >50 RBC/hpf — ABNORMAL HIGH (ref 0–5)
Specific Gravity, Urine: >1.046 — ABNORMAL HIGH (ref 1.005–1.030)
pH: 5 (ref 5.0–8.0)

## 2022-03-21 LAB — COMPREHENSIVE METABOLIC PANEL
ALT: 49 U/L — ABNORMAL HIGH (ref 0–44)
AST: 31 U/L (ref 15–41)
Albumin: 4.3 g/dL (ref 3.5–5.0)
Alkaline Phosphatase: 57 U/L (ref 38–126)
Anion gap: 13 (ref 5–15)
BUN: 13 mg/dL (ref 6–20)
CO2: 24 mmol/L (ref 22–32)
Calcium: 9.8 mg/dL (ref 8.9–10.3)
Chloride: 99 mmol/L (ref 98–111)
Creatinine, Ser: 0.85 mg/dL (ref 0.44–1.00)
GFR, Estimated: >60 mL/min (ref >60)
Glucose, Bld: 144 mg/dL — ABNORMAL HIGH (ref 70–99)
Potassium: 3.8 mmol/L (ref 3.5–5.1)
Sodium: 136 mmol/L (ref 135–145)
Total Bilirubin: 2 mg/dL — ABNORMAL HIGH (ref 0.3–1.2)
Total Protein: 7.8 g/dL (ref 6.5–8.1)

## 2022-03-21 LAB — LIPASE, BLOOD: Lipase: 26 U/L (ref 11–51)

## 2022-03-21 MED ORDER — KETOROLAC TROMETHAMINE 30 MG/ML IJ SOLN
30.0000 mg | Freq: Once | INTRAMUSCULAR | Status: AC
  Administered 2022-03-21: 30 mg via INTRAVENOUS
  Filled 2022-03-21: qty 1

## 2022-03-21 MED ORDER — MAGNESIUM HYDROXIDE 400 MG/5ML PO SUSP: 30.0000 mL | Freq: Every day | ORAL | Status: DC | PRN

## 2022-03-21 MED ORDER — ALBUTEROL SULFATE (2.5 MG/3ML) 0.083% IN NEBU: 3.0000 mL | INHALATION_SOLUTION | Freq: Four times a day (QID) | RESPIRATORY_TRACT | Status: DC | PRN

## 2022-03-21 MED ORDER — OXYCODONE HCL 5 MG PO TABS
5.0000 mg | ORAL_TABLET | Freq: Four times a day (QID) | ORAL | Status: DC | PRN
  Administered 2022-03-21 – 2022-03-24 (×4): 10 mg via ORAL
  Filled 2022-03-21 (×4): qty 2

## 2022-03-21 MED ORDER — IOHEXOL 300 MG/ML  SOLN
100.0000 mL | Freq: Once | INTRAMUSCULAR | Status: AC | PRN
  Administered 2022-03-21: 100 mL via INTRAVENOUS

## 2022-03-21 MED ORDER — HYDROMORPHONE HCL 1 MG/ML IJ SOLN
1.0000 mg | Freq: Once | INTRAMUSCULAR | Status: AC
  Administered 2022-03-21: 1 mg via INTRAVENOUS
  Filled 2022-03-21: qty 1

## 2022-03-21 MED ORDER — LACTATED RINGERS IV SOLN: INTRAVENOUS | Status: DC

## 2022-03-21 MED ORDER — ONDANSETRON HCL 4 MG PO TABS: 4.0000 mg | ORAL_TABLET | Freq: Four times a day (QID) | ORAL | Status: DC | PRN

## 2022-03-21 MED ORDER — PIPERACILLIN-TAZOBACTAM 3.375 G IVPB
3.3750 g | INTRAVENOUS | Status: AC
  Administered 2022-03-21: 3.375 g via INTRAVENOUS
  Filled 2022-03-21: qty 50

## 2022-03-21 MED ORDER — MORPHINE SULFATE (PF) 4 MG/ML IV SOLN
4.0000 mg | Freq: Once | INTRAVENOUS | Status: AC
  Administered 2022-03-21: 4 mg via INTRAVENOUS
  Filled 2022-03-21: qty 1

## 2022-03-21 MED ORDER — DOCUSATE SODIUM 100 MG PO CAPS
100.0000 mg | ORAL_CAPSULE | Freq: Two times a day (BID) | ORAL | Status: DC
  Administered 2022-03-23 – 2022-03-24 (×3): 100 mg via ORAL
  Filled 2022-03-21 (×5): qty 1

## 2022-03-21 MED ORDER — PIPERACILLIN-TAZOBACTAM 3.375 G IVPB
3.3750 g | Freq: Three times a day (TID) | INTRAVENOUS | Status: DC
  Administered 2022-03-22 – 2022-03-25 (×10): 3.375 g via INTRAVENOUS
  Filled 2022-03-21 (×11): qty 50

## 2022-03-21 MED ORDER — ZOLPIDEM TARTRATE 5 MG PO TABS
5.0000 mg | ORAL_TABLET | Freq: Every evening | ORAL | Status: DC | PRN
  Administered 2022-03-23: 5 mg via ORAL
  Filled 2022-03-21: qty 1

## 2022-03-21 MED ORDER — BISACODYL 5 MG PO TBEC
5.0000 mg | DELAYED_RELEASE_TABLET | Freq: Every day | ORAL | Status: DC | PRN
  Administered 2022-03-30 – 2022-04-10 (×2): 5 mg via ORAL
  Filled 2022-03-21 (×3): qty 1

## 2022-03-21 MED ORDER — ALUM & MAG HYDROXIDE-SIMETH 200-200-20 MG/5ML PO SUSP
30.0000 mL | ORAL | Status: DC | PRN
Start: 2022-03-21 — End: 2022-04-11
  Administered 2022-03-29: 30 mL via ORAL
  Filled 2022-03-21: qty 30

## 2022-03-21 MED ORDER — SIMETHICONE 80 MG PO CHEW
80.0000 mg | CHEWABLE_TABLET | Freq: Four times a day (QID) | ORAL | Status: DC | PRN
  Administered 2022-03-30 – 2022-04-07 (×11): 80 mg via ORAL
  Filled 2022-03-21 (×11): qty 1

## 2022-03-21 MED ORDER — SODIUM CHLORIDE 0.9 % IV BOLUS
1000.0000 mL | Freq: Once | INTRAVENOUS | Status: AC
Start: 2022-03-21 — End: 2022-03-21
  Administered 2022-03-21: 1000 mL via INTRAVENOUS

## 2022-03-21 MED ORDER — ONDANSETRON HCL 4 MG/2ML IJ SOLN
4.0000 mg | Freq: Once | INTRAMUSCULAR | Status: AC
Start: 2022-03-21 — End: 2022-03-21
  Administered 2022-03-21: 4 mg via INTRAVENOUS
  Filled 2022-03-21: qty 2

## 2022-03-21 MED ORDER — TRAMADOL HCL 50 MG PO TABS
50.0000 mg | ORAL_TABLET | Freq: Four times a day (QID) | ORAL | Status: DC | PRN
  Administered 2022-03-22 – 2022-03-23 (×4): 50 mg via ORAL
  Filled 2022-03-21 (×4): qty 1

## 2022-03-21 MED ORDER — HYDROMORPHONE HCL 1 MG/ML IJ SOLN
0.2000 mg | INTRAMUSCULAR | Status: DC | PRN
  Administered 2022-03-21 – 2022-03-22 (×12): 0.5 mg via INTRAVENOUS
  Administered 2022-03-23 – 2022-03-24 (×11): 0.6 mg via INTRAVENOUS
  Filled 2022-03-21: qty 1
  Filled 2022-03-21: qty 0.5
  Filled 2022-03-21 (×2): qty 1
  Filled 2022-03-21 (×2): qty 0.5
  Filled 2022-03-21 (×6): qty 1
  Filled 2022-03-21 (×7): qty 0.5
  Filled 2022-03-21: qty 1
  Filled 2022-03-21: qty 0.5
  Filled 2022-03-21 (×2): qty 1

## 2022-03-21 MED ORDER — IBUPROFEN 400 MG PO TABS
600.0000 mg | ORAL_TABLET | Freq: Four times a day (QID) | ORAL | Status: DC | PRN
  Administered 2022-03-22 (×2): 600 mg via ORAL
  Filled 2022-03-21 (×2): qty 1

## 2022-03-21 MED ORDER — ONDANSETRON HCL 4 MG/2ML IJ SOLN
4.0000 mg | Freq: Four times a day (QID) | INTRAMUSCULAR | Status: DC | PRN
  Administered 2022-03-23 – 2022-04-07 (×10): 4 mg via INTRAVENOUS
  Filled 2022-03-21 (×11): qty 2

## 2022-03-21 MED ORDER — SODIUM CHLORIDE 0.9 % IV SOLN
100.0000 mg | Freq: Two times a day (BID) | INTRAVENOUS | Status: DC
  Administered 2022-03-21 – 2022-03-23 (×4): 100 mg via INTRAVENOUS
  Filled 2022-03-21 (×5): qty 100

## 2022-03-21 NOTE — ED Triage Notes (Signed)
Pt had left fallopian tube removed on Monday. Pain has been managed until today. Pt was on the floor with her sick cat and twisted. Pt began having severe pain after in her abdoman and vagina.

## 2022-03-21 NOTE — Progress Notes (Signed)
Patient is still in severe pain, however, states it is getting better than it was earlier. She says the pain started earlier today and continued to get worse. She is resting, eyes closed and appears to be comfortable. She is receiving antibiotic infusion and has had pain medicines per orders. RN is at bedside.   Rod Can, CNM

## 2022-03-21 NOTE — H&P (Addendum)
GYNECOLOGY HISTORY AND PHYSICAL NOTE  Lacey Harrison is a 36 y.o. [id number] female who presented from the Emergency Room yesterday due to complaints of sudden onset of lower abdominal pain.  She is POD#4 s/p robotic-assisted laparoscopic adhesiolysis, left salpingectomy, drainage of peritoneal cyst, and chromotubation. Also diagnosed with endometriosis by laparoscopy biopsy. She denies nausea/vomiting, fevers, chills, chest pain, or SOB.  Notes that she was on the floor with her cat and she twisted her body around, then felt the sudden onset of pain that never let up.  Pain has been constant since that time. On presentation to the ER, patient underwent evaluation with CT scan that noted a fluid collection 8 x 7 cm in the retrouterine space suspicious for an abscess. Patient initiated on IV Zosyn and Doxycycline.  Patient this morning complains of feeling like her arms and legs are "on fire". Feels like her body is "locking up" from the pain.  Reports that it hurts to move. Currently rates her pain 5/10.  Continues to deny fevers or chills. Notes she is afraid.    Pertinent Gynecological History: Menses:  irregularly occurring.Patient's last menstrual period was 02/25/2022 (approximate). Contraception: none Blood transfusions: none Sexually transmitted diseases: no past history Previous GYN Procedures:  ovarian cystectomy, chromotubation, laparosocopic lysis of adhesions (x 2), treatment of ectopic      OB History  Gravida Para Term Preterm AB Living  1       1    SAB IAB Ectopic Multiple Live Births      1        # Outcome Date GA Lbr Len/2nd Weight Sex Delivery Anes PTL Lv  1 Ectopic             Past Medical History:  Diagnosis Date   Allergy    Anxiety    Chicken pox    Depression    Heart murmur    Lyme disease    patient reports a remote history of lyme and alludes that she has "chronic lyme"   Migraines    MIGRAINES   Pneumonia 07/2017    Past Surgical History:   Procedure Laterality Date   CHROMOPERTUBATION  02/16/2018   Procedure: CHROMOPERTUBATION;  Surgeon: Schermerhorn, Gwen Her, MD;  Location: [facility];  Service: Gynecology;;   CHROMOPERTUBATION Left 03/18/2022   Procedure: CHROMOPERTUBATION;  Surgeon: Rubie Maid, MD;  Location: [facility];  Service: Gynecology;  Laterality: Left;   CYSTECTOMY Left    OVARY   DILATION AND CURETTAGE OF UTERUS  2008   LAPAROSCOPIC LYSIS OF ADHESIONS N/A 02/16/2018   Procedure: EXTENSIVE LAPAROSCOPIC LYSIS OF ADHESIONS;  Surgeon: Schermerhorn, Gwen Her, MD;  Location: [facility];  Service: Gynecology;  Laterality: N/A;   LAPAROSCOPY N/A 02/16/2018   Procedure: LAPAROSCOPY OPERATIVE;  Surgeon: Schermerhorn, Gwen Her, MD;  Location: [facility];  Service: Gynecology;  Laterality: N/A;   LYSIS OF ADHESION N/A 03/18/2022   Procedure: LYSIS OF ADHESION;  Surgeon: Rubie Maid, MD;  Location: [facility];  Service: Gynecology;  Laterality: N/A;   XI ROBOTIC ASSISTED SALPINGECTOMY Left 03/18/2022   Procedure: XI ROBOTIC ASSISTED SALPINGECTOMY;  Surgeon: Rubie Maid, MD;  Location: [facility];  Service: Gynecology;  Laterality: Left;    Family History  Problem Relation Age of Onset   Mental illness Mother    Mental illness Father    Mental illness Maternal Grandmother    Mental illness Paternal Grandmother     Social History:  reports that she has been smoking  cigarettes. She has a 10.50 pack-year smoking history. She has never used smokeless tobacco. She reports that she does not currently use alcohol after a past usage of about 11.0 standard drinks of alcohol per week. She reports that she does not use drugs.  Allergies:  Allergies  Allergen Reactions   Tylenol [Acetaminophen] Other (See Comments)    Lingering Taste    Medications: Prior to Admission:  Medications Prior to Admission  Medication Sig Dispense Refill Last Dose   ibuprofen (ADVIL) 800 MG tablet Take 1 tablet (800 mg total) by mouth every 8 (eight) hours  as needed. 60 tablet 1 03/21/2022   oxyCODONE (ROXICODONE) 5 MG immediate release tablet Take 1 tablet (5 mg total) by mouth every 8 (eight) hours as needed. 20 tablet 0 03/21/2022   simethicone (GAS-X) 80 MG chewable tablet Chew 1 tablet (80 mg total) by mouth 4 (four) times daily as needed for flatulence. 60 tablet 1 03/21/2022   Vitamin D, Ergocalciferol, (DRISDOL) 1.25 MG (50000 UNIT) CAPS capsule Take 50,000 Units by mouth every 7 (seven) days.   Past Week   albuterol (VENTOLIN HFA) 108 (90 Base) MCG/ACT inhaler Inhale 1-2 puffs into the lungs every 6 (six) hours as needed for wheezing or shortness of breath. 1 each 0 prn at prn    Review of Systems  Constitutional:  Negative for chills and fever.  HENT: Negative.    Eyes: Negative.   Respiratory: Negative.  Negative for cough, shortness of breath and wheezing.   Cardiovascular:  Negative for chest pain.  Gastrointestinal:  Negative for constipation, diarrhea and vomiting.  Genitourinary:  Positive for pelvic pain. Negative for flank pain, menstrual problem, vaginal bleeding and vaginal discharge.  Musculoskeletal: Negative.   Skin: Negative.   Neurological:  Negative for dizziness and light-headedness.  Psychiatric/Behavioral:  The patient is nervous/anxious.     Blood pressure (!) 80/54, pulse (!) 122, temperature 97.6 F (36.4 C), temperature source Oral, resp. rate (!) 22, height '5\' 3"'$  (1.6 m), weight 91 kg, last menstrual period 02/25/2022, SpO2 95 %. Physical Exam Constitutional:      Appearance: She is well-developed.     Comments: Mild distress  HENT:     Head: Normocephalic and atraumatic.  Eyes:     Extraocular Movements: Extraocular movements intact.     Pupils: Pupils are equal, round, and reactive to light.  Cardiovascular:     Rate and Rhythm: Regular rhythm. Tachycardia present.     Heart sounds: No murmur heard. Pulmonary:     Effort: Pulmonary effort is normal.     Breath sounds: Normal breath sounds.      Comments: Patient on 2L [place] as she was reported to hyperventilate due to panic attacks. Abdominal:     General: Abdomen is flat. A surgical scar is present. Bowel sounds are normal. There is no distension.     Palpations: Abdomen is soft. There is no mass.     Tenderness: There is abdominal tenderness in the right lower quadrant and left lower quadrant. There is no guarding or rebound.  Genitourinary:    Comments: Deferred Skin:    General: Skin is warm and dry.  Neurological:     General: No focal deficit present.     Mental Status: She is alert.  Psychiatric:        Mood and Affect: Mood is anxious.        Labs:     Latest Ref Rng & Units 03/22/2022    5:43 AM  03/21/2022    5:44 PM 03/11/2022    3:42 PM  CBC EXTENDED  WBC 4.0 - 10.5 K/uL 9.9  16.8  9.1   RBC 3.87 - 5.11 MIL/uL 5.06  5.28  5.35   Hemoglobin 12.0 - 15.0 g/dL 14.7  15.3  15.4   HCT 36.0 - 46.0 % 43.1  44.5  44.9   Platelets 150 - 400 K/uL 290  294  321   NEUT# 1.7 - 7.7 K/uL 8.3  14.5    Lymph# 0.7 - 4.0 K/uL 0.6  1.3         Latest Ref Rng & Units 03/21/2022    5:44 PM 01/31/2021   11:38 AM 11/22/2020   11:19 AM  CMP  Glucose 70 - 99 mg/dL 144  97  83   BUN 6 - 20 mg/dL '13  12  9   '$ Creatinine 0.44 - 1.00 mg/dL 0.85  0.68  0.57   Sodium 135 - 145 mmol/L 136  140  143   Potassium 3.5 - 5.1 mmol/L 3.8  4.7  4.4   Chloride 98 - 111 mmol/L 99  101  103   CO2 22 - 32 mmol/L '24  27  23   '$ Calcium 8.9 - 10.3 mg/dL 9.8  9.5  9.3   Total Protein 6.5 - 8.1 g/dL 7.8  7.1  7.0   Total Bilirubin 0.3 - 1.2 mg/dL 2.0  1.1  0.5   Alkaline Phos 38 - 126 U/L 57  55  59   AST 15 - 41 U/L 31  32  36   ALT 0 - 44 U/L 49  56  58     Imaging: CT ABDOMEN PELVIS W CONTRAST  Result Date: 03/21/2022 CLINICAL DATA:  Abdominal pain, post-op Abdominal pain, acute, nonlocalized. Robotic salpingectomy 03/18/2022 EXAM: CT ABDOMEN AND PELVIS WITH CONTRAST TECHNIQUE: Multidetector CT imaging of the abdomen and pelvis was performed  using the standard protocol following bolus administration of intravenous contrast. RADIATION DOSE REDUCTION: This exam was performed according to the departmental dose-optimization program which includes automated exposure control, adjustment of the mA and/or kV according to patient size and/or use of iterative reconstruction technique. CONTRAST:  128m OMNIPAQUE IOHEXOL 300 MG/ML  SOLN COMPARISON:  None Available. FINDINGS: Lower chest: Left base atelectasis.  No acute abnormality. Hepatobiliary: Liver is enlarged measuring up to 19 cm. The hepatic parenchyma is diffusely hypodense compared to the splenic parenchyma consistent with fatty infiltration. No focal liver abnormality. No gallstones, gallbladder wall thickening, or pericholecystic fluid. No biliary dilatation. Pancreas: No focal lesion. Normal pancreatic contour. No surrounding inflammatory changes. No main pancreatic ductal dilatation. Spleen: Normal in size without focal abnormality. Adrenals/Urinary Tract: No adrenal nodule bilaterally. Bilateral kidneys enhance symmetrically. No hydronephrosis. No hydroureter. The urinary bladder is unremarkable. On delayed imaging, there is no urothelial wall thickening and there are no filling defects in the opacified portions of the bilateral collecting systems or ureters as well as urinary bladder. Stomach/Bowel: Stomach is within normal limits. No evidence of bowel wall thickening or dilatation. Appendix appears normal. Vascular/Lymphatic: No abdominal aorta or iliac aneurysm. No abdominal, pelvic, or inguinal lymphadenopathy. Reproductive: Uterus is unremarkable no adnexal mass identified. Other: No intraperitoneal free fluid. Small volume free intraperitoneal gas. There is a gas and fluid collection with peripheral enhancement measuring approximately 8 x 9 x 7.5 cm centered within the left rectouterine pouch. Musculoskeletal: No abdominal wall hernia or abnormality. No suspicious lytic or blastic osseous  lesions. No acute displaced fracture.  IMPRESSION: 1. An 8 x 9 x 7.5cm gas and fluid collection centered within the left rectouterine pouch likely represents a developing abscess. No findings to suggest bowel injury; however, this is not fully excluded. If a CT is repeated in the near future, consider use of IV and PO contrast. 2. Small volume pneumoperitoneum likely postsurgical in etiology. 3. Hepatomegaly and hepatic steatosis. Electronically Signed   By: Iven Finn M.D.   On: 03/21/2022 18:43     Assessment/Plan: Post-operative pelvic abscess Patient initiated on antibiotics (IV Zosyn and Doxycycline).  WBC count trending down already, however patient's BPs have been labile (at times hypotensive) and tachycardia present. Remains afebrile. IV bolus administered. Lactic acid ordered.  Plan for possible IR drainage of abscess today. Patient has been NPO overnight in anticipation of procedure.  Continue IV pain management for now. Also will order Flexeril as patient feels like her muscles are "locking up".    A total of 55 minutes were spent during this encounter, including chart review (notes, recent imaging and labs), face-to-face with time with patient involving counseling and coordination of care, as well as documentation for current visit.   Rubie Maid, MD [facility] 03/21/2022

## 2022-03-21 NOTE — ED Provider Notes (Signed)
[facility] Provider Note    Event Date/Time   First MD Initiated Contact with Patient 03/21/22 1733     (approximate)   History   Chief Complaint: Abdominal Pain and Post-op Problem   HPI  Lacey Harrison is a 36 y.o. female who is postop day 3 from robotic assisted laparoscopic surgery which included lysis of adhesions, drainage of 500 mL peritoneal cyst, left salpingectomy who comes to the ED complaining of sudden onset of severe abdominal pain that started this afternoon while lying on the floor with her.  Denies any chest pain or shortness of breath, no dizziness or vomiting.  Pain is constant, unbearable, no aggravating or alleviating factors.  No fever     Physical Exam   Triage Vital Signs: ED Triage Vitals  Enc Vitals Group     BP 03/21/22 1721 (!) 174/141     Pulse Rate 03/21/22 1721 (!) 128     Resp 03/21/22 1721 19     Temp 03/21/22 1721 98.3 F (36.8 C)     Temp Source 03/21/22 1721 Oral     SpO2 03/21/22 1721 97 %     Weight 03/21/22 1722 200 lb 9.9 oz (91 kg)     Height 03/21/22 1722 '5\' 3"'$  (1.6 m)     Head Circumference --      Peak Flow --      Pain Score 03/21/22 1722 10     Pain Loc --      Pain Edu? --      Excl. in [place]? --     Most recent vital signs: Vitals:   03/21/22 2211 03/21/22 2314  BP: (!) 96/58 93/60  Pulse: (!) 109 (!) 115  Resp:  (!) 22  Temp:  99.5 F (37.5 C)  SpO2: 90% 91%    General: Awake, no distress.  CV:  Good peripheral perfusion.  Tachycardia, heart rate of 130. Resp:  Normal effort.  Clear to auscultation bilaterally Abd:  No distention.  Soft with diffuse lower abdominal tenderness Other:  Moist mucous membranes   ED Results / Procedures / Treatments   Labs (all labs ordered are listed, but only abnormal results are displayed) Labs Reviewed  COMPREHENSIVE METABOLIC PANEL - Abnormal; Notable for the following components:      Result Value   Glucose, Bld 144 (*)    ALT 49 (*)     Total Bilirubin 2.0 (*)    All other components within normal limits  CBC WITH DIFFERENTIAL/PLATELET - Abnormal; Notable for the following components:   WBC 16.8 (*)    RBC 5.28 (*)    Hemoglobin 15.3 (*)    Neutro Abs 14.5 (*)    Abs Immature Granulocytes 0.10 (*)    All other components within normal limits  URINALYSIS, ROUTINE W REFLEX MICROSCOPIC - Abnormal; Notable for the following components:   Color, Urine YELLOW (*)    APPearance CLEAR (*)    Specific Gravity, Urine >1.046 (*)    Hgb urine dipstick LARGE (*)    RBC / HPF >50 (*)    All other components within normal limits  LIPASE, BLOOD  CBC WITH DIFFERENTIAL/PLATELET     EKG    RADIOLOGY CT abdomen pelvis interpreted by me, shows an irregular rim-enhancing fluid collection in the pelvis.  Radiology report reviewed, suspicious for abscess.   PROCEDURES:  Procedures   MEDICATIONS ORDERED IN ED: Medications  simethicone (MYLICON) chewable tablet 80 mg (has no administration in time  range)  albuterol (PROVENTIL) (2.5 MG/3ML) 0.083% nebulizer solution 3 mL (has no administration in time range)  lactated ringers infusion (0 mLs Intravenous Stopped 03/21/22 2318)  ibuprofen (ADVIL) tablet 600 mg (has no administration in time range)  HYDROmorphone (DILAUDID) injection 0.2-0.6 mg (0.5 mg Intravenous Given 03/21/22 2200)  zolpidem (AMBIEN) tablet 5 mg (has no administration in time range)  docusate sodium (COLACE) capsule 100 mg (100 mg Oral Not Given 03/21/22 2140)  magnesium hydroxide (MILK OF MAGNESIA) suspension 30 mL (has no administration in time range)  bisacodyl (DULCOLAX) EC tablet 5 mg (has no administration in time range)  alum & mag hydroxide-simeth (MAALOX/MYLANTA) 200-200-20 MG/5ML suspension 30 mL (has no administration in time range)  ondansetron (ZOFRAN) tablet 4 mg (has no administration in time range)    Or  ondansetron (ZOFRAN) injection 4 mg (has no administration in time range)   piperacillin-tazobactam (ZOSYN) IVPB 3.375 g (has no administration in time range)  doxycycline (VIBRAMYCIN) 100 mg in sodium chloride 0.9 % 250 mL IVPB (100 mg Intravenous New Bag/Given 03/21/22 2319)  traMADol (ULTRAM) tablet 50 mg (has no administration in time range)  oxyCODONE (Oxy IR/ROXICODONE) immediate release tablet 5-10 mg (10 mg Oral Given 03/21/22 2056)  HYDROmorphone (DILAUDID) injection 1 mg (1 mg Intravenous Given 03/21/22 1738)  sodium chloride 0.9 % bolus 1,000 mL (0 mLs Intravenous Stopped 03/21/22 1944)  ondansetron (ZOFRAN) injection 4 mg (4 mg Intravenous Given 03/21/22 1738)  iohexol (OMNIPAQUE) 300 MG/ML solution 100 mL (100 mLs Intravenous Contrast Given 03/21/22 1823)  morphine (PF) 4 MG/ML injection 4 mg (4 mg Intravenous Given 03/21/22 1843)  piperacillin-tazobactam (ZOSYN) IVPB 3.375 g (0 g Intravenous Stopped 03/21/22 2300)  HYDROmorphone (DILAUDID) injection 1 mg (1 mg Intravenous Given 03/21/22 2008)  ketorolac (TORADOL) 30 MG/ML injection 30 mg (30 mg Intravenous Given 03/21/22 2055)     IMPRESSION / MDM / ASSESSMENT AND PLAN / ED COURSE  I reviewed the triage vital signs and the nursing notes.                              Differential diagnosis includes, but is not limited to, bowel perforation, intra-abdominal abscess, internal hernia, hemoperitoneum  Patient's presentation is most consistent with acute presentation with potential threat to life or bodily function.     Clinical Course as of 03/22/22 0007  Thu Mar 21, 2022  1752 Pt p/w severe abd pain 3 days after robotic lap surgery for LOA and and removal of large L peritoneal cyst/salpingectomy.  Concern for sbo, internal hernia, hemoperitoneum. Will obtain CT, give IV dilaudid for pain control. [PS]  9528 CT suspicious for pelvic abscess. Pt is not septic. Will start zosyn and plan to admit.  [PS]  1938 Findings d/w Dr. Marcelline Mates, gyn team will admit. [PS]    Clinical Course User Index [PS] Carrie Mew, MD     FINAL CLINICAL IMPRESSION(S) / ED DIAGNOSES   Final diagnoses:  Pelvic abscess in female  Abdominal pain, unspecified abdominal location     Rx / DC Orders   ED Discharge Orders     None        Note:  This document was prepared using Dragon voice recognition software and may include unintentional dictation errors.   Carrie Mew, MD 03/22/22 [phone]

## 2022-03-22 ENCOUNTER — Encounter: Payer: Self-pay | Admitting: Obstetrics and Gynecology

## 2022-03-22 ENCOUNTER — Inpatient Hospital Stay: Payer: No Typology Code available for payment source

## 2022-03-22 DIAGNOSIS — T8143XA Infection following a procedure, organ and space surgical site, initial encounter: Secondary | ICD-10-CM | POA: Diagnosis not present

## 2022-03-22 LAB — CBC WITH DIFFERENTIAL/PLATELET
Abs Immature Granulocytes: 0.05 10*3/uL (ref 0.00–0.07)
Abs Immature Granulocytes: 0.07 10*3/uL (ref 0.00–0.07)
Basophils Absolute: 0 10*3/uL (ref 0.0–0.1)
Basophils Absolute: 0 10*3/uL (ref 0.0–0.1)
Basophils Relative: 0 %
Basophils Relative: 0 %
Eosinophils Absolute: 0 10*3/uL (ref 0.0–0.5)
Eosinophils Absolute: 0 10*3/uL (ref 0.0–0.5)
Eosinophils Relative: 0 %
Eosinophils Relative: 0 %
HCT: 43.1 % (ref 36.0–46.0)
HCT: 40.2 % (ref 36.0–46.0)
Hemoglobin: 14.7 g/dL (ref 12.0–15.0)
Hemoglobin: 13.8 g/dL (ref 12.0–15.0)
Immature Granulocytes: 1 %
Immature Granulocytes: 1 %
Lymphocytes Relative: 6 %
Lymphocytes Relative: 5 %
Lymphs Abs: 0.6 10*3/uL — ABNORMAL LOW (ref 0.7–4.0)
Lymphs Abs: 0.6 10*3/uL — ABNORMAL LOW (ref 0.7–4.0)
MCH: 29.1 pg (ref 26.0–34.0)
MCH: 29.1 pg (ref 26.0–34.0)
MCHC: 34.1 g/dL (ref 30.0–36.0)
MCHC: 34.3 g/dL (ref 30.0–36.0)
MCV: 85.2 fL (ref 80.0–100.0)
MCV: 84.6 fL (ref 80.0–100.0)
Monocytes Absolute: 0.9 10*3/uL (ref 0.1–1.0)
Monocytes Absolute: 0.9 10*3/uL (ref 0.1–1.0)
Monocytes Relative: 9 %
Monocytes Relative: 8 %
Neutro Abs: 8.3 10*3/uL — ABNORMAL HIGH (ref 1.7–7.7)
Neutro Abs: 10 10*3/uL — ABNORMAL HIGH (ref 1.7–7.7)
Neutrophils Relative %: 84 %
Neutrophils Relative %: 86 %
Platelets: 290 10*3/uL (ref 150–400)
Platelets: 260 10*3/uL (ref 150–400)
RBC: 5.06 MIL/uL (ref 3.87–5.11)
RBC: 4.75 MIL/uL (ref 3.87–5.11)
RDW: 12.5 % (ref 11.5–15.5)
RDW: 12.6 % (ref 11.5–15.5)
Smear Review: NORMAL
Smear Review: ADEQUATE
WBC: 9.9 10*3/uL (ref 4.0–10.5)
WBC: 11.5 10*3/uL — ABNORMAL HIGH (ref 4.0–10.5)
nRBC: 0 % (ref 0.0–0.2)
nRBC: 0 % (ref 0.0–0.2)

## 2022-03-22 LAB — BASIC METABOLIC PANEL
Anion gap: 9 (ref 5–15)
BUN: 19 mg/dL (ref 6–20)
CO2: 19 mmol/L — ABNORMAL LOW (ref 22–32)
Calcium: 7.5 mg/dL — ABNORMAL LOW (ref 8.9–10.3)
Chloride: 108 mmol/L (ref 98–111)
Creatinine, Ser: 0.89 mg/dL (ref 0.44–1.00)
GFR, Estimated: >60 mL/min (ref >60)
Glucose, Bld: 109 mg/dL — ABNORMAL HIGH (ref 70–99)
Potassium: 4.1 mmol/L (ref 3.5–5.1)
Sodium: 136 mmol/L (ref 135–145)

## 2022-03-22 LAB — LACTIC ACID, PLASMA
Lactic Acid, Venous: 3.4 mmol/L (ref 0.5–1.9)
Lactic Acid, Venous: 3.3 mmol/L (ref 0.5–1.9)
Lactic Acid, Venous: 4.9 mmol/L (ref 0.5–1.9)
Lactic Acid, Venous: 2.9 mmol/L (ref 0.5–1.9)

## 2022-03-22 LAB — BLOOD GAS, VENOUS
Acid-base deficit: 7.5 mmol/L — ABNORMAL HIGH (ref 0.0–2.0)
Bicarbonate: 19.3 mmol/L — ABNORMAL LOW (ref 20.0–28.0)
O2 Saturation: 81.6 %
Patient temperature: 37
pCO2, Ven: 43 mmHg — ABNORMAL LOW (ref 44–60)
pH, Ven: 7.26 (ref 7.25–7.43)
pO2, Ven: 53 mmHg — ABNORMAL HIGH (ref 32–45)

## 2022-03-22 LAB — PROTIME-INR
INR: 1.3 — ABNORMAL HIGH (ref 0.8–1.2)
Prothrombin Time: 16.1 seconds — ABNORMAL HIGH (ref 11.4–15.2)

## 2022-03-22 LAB — MAGNESIUM: Magnesium: 1.4 mg/dL — ABNORMAL LOW (ref 1.7–2.4)

## 2022-03-22 LAB — COMPREHENSIVE METABOLIC PANEL
ALT: 34 U/L (ref 0–44)
AST: 27 U/L (ref 15–41)
Albumin: 3.3 g/dL — ABNORMAL LOW (ref 3.5–5.0)
Alkaline Phosphatase: 35 U/L — ABNORMAL LOW (ref 38–126)
Anion gap: 12 (ref 5–15)
BUN: 26 mg/dL — ABNORMAL HIGH (ref 6–20)
CO2: 21 mmol/L — ABNORMAL LOW (ref 22–32)
Calcium: 8.2 mg/dL — ABNORMAL LOW (ref 8.9–10.3)
Chloride: 103 mmol/L (ref 98–111)
Creatinine, Ser: 1.82 mg/dL — ABNORMAL HIGH (ref 0.44–1.00)
GFR, Estimated: 37 mL/min — ABNORMAL LOW (ref >60)
Glucose, Bld: 138 mg/dL — ABNORMAL HIGH (ref 70–99)
Potassium: 5.3 mmol/L — ABNORMAL HIGH (ref 3.5–5.1)
Sodium: 136 mmol/L (ref 135–145)
Total Bilirubin: 4.9 mg/dL — ABNORMAL HIGH (ref 0.3–1.2)
Total Protein: 6.2 g/dL — ABNORMAL LOW (ref 6.5–8.1)

## 2022-03-22 LAB — PHOSPHORUS: Phosphorus: 3.6 mg/dL (ref 2.5–4.6)

## 2022-03-22 LAB — GLUCOSE, CAPILLARY: Glucose-Capillary: 95 mg/dL (ref 70–99)

## 2022-03-22 LAB — APTT: aPTT: 34 seconds (ref 24–36)

## 2022-03-22 MED ORDER — ORPHENADRINE CITRATE 30 MG/ML IJ SOLN
60.0000 mg | Freq: Once | INTRAMUSCULAR | Status: AC | PRN
Start: 2022-03-23 — End: 2022-03-23
  Administered 2022-03-23: 60 mg via INTRAVENOUS

## 2022-03-22 MED ORDER — FENTANYL CITRATE (PF) 100 MCG/2ML IJ SOLN
INTRAMUSCULAR | Status: AC
  Filled 2022-03-22: qty 2

## 2022-03-22 MED ORDER — CHLORHEXIDINE GLUCONATE CLOTH 2 % EX PADS
6.0000 | MEDICATED_PAD | Freq: Every day | CUTANEOUS | Status: DC
  Administered 2022-03-24 – 2022-04-09 (×15): 6 via TOPICAL

## 2022-03-22 MED ORDER — VANCOMYCIN HCL 2000 MG/400ML IV SOLN
2000.0000 mg | Freq: Once | INTRAVENOUS | Status: AC
  Administered 2022-03-22: 2000 mg via INTRAVENOUS
  Filled 2022-03-22: qty 400

## 2022-03-22 MED ORDER — SODIUM CHLORIDE 0.9 % IV BOLUS
1000.0000 mL | Freq: Once | INTRAVENOUS | Status: AC
  Administered 2022-03-22: 1000 mL via INTRAVENOUS

## 2022-03-22 MED ORDER — CYCLOBENZAPRINE HCL 10 MG PO TABS
10.0000 mg | ORAL_TABLET | Freq: Three times a day (TID) | ORAL | Status: DC | PRN
  Administered 2022-03-22 (×2): 10 mg via ORAL
  Filled 2022-03-22 (×5): qty 1

## 2022-03-22 MED ORDER — VANCOMYCIN HCL IN DEXTROSE 1-5 GM/200ML-% IV SOLN: 1000.0000 mg | Freq: Once | INTRAVENOUS | Status: DC

## 2022-03-22 MED ORDER — SODIUM CHLORIDE 0.9 % IV SOLN: INTRAVENOUS | Status: DC | PRN

## 2022-03-22 MED ORDER — SODIUM CHLORIDE 0.9% FLUSH
5.0000 mL | Freq: Three times a day (TID) | INTRAVENOUS | Status: DC
  Administered 2022-03-22 – 2022-04-11 (×48): 5 mL

## 2022-03-22 MED ORDER — SODIUM CHLORIDE 0.9 % IV SOLN: INTRAVENOUS | Status: DC

## 2022-03-22 MED ORDER — VANCOMYCIN VARIABLE DOSE PER UNSTABLE RENAL FUNCTION (PHARMACIST DOSING): Status: DC

## 2022-03-22 MED ORDER — SODIUM BICARBONATE 8.4 % IV SOLN
50.0000 meq | Freq: Once | INTRAVENOUS | Status: AC
  Administered 2022-03-23: 50 meq via INTRAVENOUS
  Filled 2022-03-22: qty 50

## 2022-03-22 MED ORDER — DIAZEPAM 5 MG/ML IJ SOLN
10.0000 mg | Freq: Once | INTRAMUSCULAR | Status: AC
  Administered 2022-03-22: 10 mg via INTRAVENOUS
  Filled 2022-03-22: qty 2

## 2022-03-22 MED ORDER — FENTANYL CITRATE (PF) 100 MCG/2ML IJ SOLN
INTRAMUSCULAR | Status: AC | PRN
  Administered 2022-03-22: 25 ug via INTRAVENOUS

## 2022-03-22 MED ORDER — ORPHENADRINE CITRATE 30 MG/ML IJ SOLN
60.0000 mg | Freq: Two times a day (BID) | INTRAMUSCULAR | Status: DC
  Filled 2022-03-22 (×2): qty 2

## 2022-03-22 MED ORDER — HEPARIN SODIUM (PORCINE) 5000 UNIT/ML IJ SOLN
5000.0000 [IU] | Freq: Three times a day (TID) | INTRAMUSCULAR | Status: DC
  Administered 2022-03-22 – 2022-04-01 (×27): 5000 [IU] via SUBCUTANEOUS
  Filled 2022-03-22 (×29): qty 1

## 2022-03-22 MED ORDER — MAGNESIUM SULFATE 4 GM/100ML IV SOLN
4.0000 g | Freq: Once | INTRAVENOUS | Status: AC
  Administered 2022-03-23: 4 g via INTRAVENOUS
  Filled 2022-03-22: qty 100

## 2022-03-22 MED ORDER — SODIUM CHLORIDE 0.9 % IV BOLUS (SEPSIS)
1000.0000 mL | Freq: Once | INTRAVENOUS | Status: AC
  Administered 2022-03-22: 1000 mL via INTRAVENOUS

## 2022-03-22 NOTE — Progress Notes (Signed)
Pt. Transported to radiology via bed by transporter.

## 2022-03-22 NOTE — Progress Notes (Signed)
During shift assessment, around 1900, patient complained of vaginal pain 9/10. Patient stated she also has chronic shoulder pain. Patient using ice packs on her shoulder. PRN flexeril given.   Vaginal bleeding scant. Incision sites WDL. JP drain charged and currently empty.  Patient requested to get up to the bedside commode. Patient desated as low as 40s while up to beside commode, and 70s while moving around in the bed. Patient on 2L [place]. Patient HR also averaging in the 130s and respirations in the 30s consistently. Patient stated she cannot take deep breaths and feels a "catch in her chest and lungs." Patient voided 256m dark amber urine.  During pain reassessment, patient stated she has SOB and chest pain. RN called and updated provider, Dr. CMarcelline Mates about information above. Chest x-ray ordered by provider, and provider is en route to see patient. RN will continue to monitor.

## 2022-03-22 NOTE — Progress Notes (Signed)
Pt to IR for abscess drainage

## 2022-03-22 NOTE — Progress Notes (Signed)
Patient transferred to ICU at 2210.

## 2022-03-22 NOTE — H&P (Signed)
Chief Complaint: Patient was seen in consultation today for pelvic abscess at the request of Rubie Maid, MD  Referring Physician(s): Rubie Maid, MD  Supervising Physician: Juliet Rude  Patient Status: [place] - In-pt  History of Present Illness: Lacey Harrison is a 36 y.o. female who is 3 days postop from robotic assisted laparoscopic surgery which included lysis of adhesions, drainage of 500 mL peritoneal cyst, left salpingectomy.  Patient presented to ED 03/21/2022 complaining of sudden severe abdominal pain.  CT abdomen pelvis was suspicious for pelvic abscess.  Patient was referred to IR by Rubie Maid, MD for pelvic abscess drain.  Procedure was approved by Dr. Denna Haggard, IR.  Past Medical History:  Diagnosis Date   Allergy    Anxiety    Chicken pox    Depression    Heart murmur    Lyme disease    patient reports a remote history of lyme and alludes that she has "chronic lyme"   Migraines    MIGRAINES   Pneumonia 07/2017    Past Surgical History:  Procedure Laterality Date   CHROMOPERTUBATION  02/16/2018   Procedure: CHROMOPERTUBATION;  Surgeon: Schermerhorn, Gwen Her, MD;  Location: [facility];  Service: Gynecology;;   CHROMOPERTUBATION Left 03/18/2022   Procedure: CHROMOPERTUBATION;  Surgeon: Rubie Maid, MD;  Location: [facility];  Service: Gynecology;  Laterality: Left;   CYSTECTOMY Left    OVARY   DILATION AND CURETTAGE OF UTERUS  2008   LAPAROSCOPIC LYSIS OF ADHESIONS N/A 02/16/2018   Procedure: EXTENSIVE LAPAROSCOPIC LYSIS OF ADHESIONS;  Surgeon: Schermerhorn, Gwen Her, MD;  Location: [facility];  Service: Gynecology;  Laterality: N/A;   LAPAROSCOPY N/A 02/16/2018   Procedure: LAPAROSCOPY OPERATIVE;  Surgeon: Schermerhorn, Gwen Her, MD;  Location: [facility];  Service: Gynecology;  Laterality: N/A;   LYSIS OF ADHESION N/A 03/18/2022   Procedure: LYSIS OF ADHESION;  Surgeon: Rubie Maid, MD;  Location: [facility];  Service: Gynecology;  Laterality: N/A;   XI  ROBOTIC ASSISTED SALPINGECTOMY Left 03/18/2022   Procedure: XI ROBOTIC ASSISTED SALPINGECTOMY;  Surgeon: Rubie Maid, MD;  Location: [facility];  Service: Gynecology;  Laterality: Left;    Allergies: Tylenol [acetaminophen]  Medications: Prior to Admission medications   Medication Sig Start Date End Date Taking? Authorizing Provider  ibuprofen (ADVIL) 800 MG tablet Take 1 tablet (800 mg total) by mouth every 8 (eight) hours as needed. 03/18/22  Yes Rubie Maid, MD  oxyCODONE (ROXICODONE) 5 MG immediate release tablet Take 1 tablet (5 mg total) by mouth every 8 (eight) hours as needed. 03/18/22 03/18/23 Yes Rubie Maid, MD  simethicone (GAS-X) 80 MG chewable tablet Chew 1 tablet (80 mg total) by mouth 4 (four) times daily as needed for flatulence. 03/18/22 03/18/23 Yes Rubie Maid, MD  Vitamin D, Ergocalciferol, (DRISDOL) 1.25 MG (50000 UNIT) CAPS capsule Take 50,000 Units by mouth every 7 (seven) days.   Yes [provider]  albuterol (VENTOLIN HFA) 108 (90 Base) MCG/ACT inhaler Inhale 1-2 puffs into the lungs every 6 (six) hours as needed for wheezing or shortness of breath. 09/25/21   Hazel Sams, PA-C     Family History  Problem Relation Age of Onset   Mental illness Mother    Mental illness Father    Mental illness Maternal Grandmother    Mental illness Paternal Grandmother     Social History   Socioeconomic History   Marital status: Married    Spouse name: Thurmond Butts   Number of children: Not on file  Years of education: Not on file   Highest education level: Not on file  Occupational History   Not on file  Tobacco Use   Smoking status: Some Days    Packs/day: 0.75    Years: 14.00    Total pack years: 10.50    Types: Cigarettes    Last attempt to quit: 07/05/2016    Years since quitting: 5.7   Smokeless tobacco: Never  Vaping Use   Vaping Use: Former  Substance and Sexual Activity   Alcohol use: Not Currently    Alcohol/week: 11.0 standard drinks of alcohol     Types: 3 Glasses of wine, 2 Cans of beer, 3 Shots of liquor, 3 Standard drinks or equivalent per week    Comment: almost daily   Drug use: No   Sexual activity: Yes    Birth control/protection: None  Other Topics Concern   Not on file  Social History Narrative   Not on file   Social Determinants of Health   Financial Resource Strain: Not on file  Food Insecurity: Not on file  Transportation Needs: Not on file  Physical Activity: Not on file  Stress: Not on file  Social Connections: Not on file    Review of Systems: A 12 point ROS discussed and pertinent positives are indicated in the HPI above.  All other systems are negative.  Review of Systems  Constitutional:  Negative for chills and fever.  Respiratory:  Positive for shortness of breath.   Cardiovascular:  Positive for chest pain.  Gastrointestinal:  Positive for abdominal distention, abdominal pain and nausea. Negative for vomiting.  Neurological:  Positive for weakness. Negative for dizziness.    Vital Signs: BP (!) 114/99 (BP Location: Right Arm) Comment: nurse Kendra notified  Pulse (!) 126   Temp 97.6 F (36.4 C) (Oral)   Resp 20   Ht '5\' 3"'$  (1.6 m)   Wt 200 lb 9.9 oz (91 kg)   LMP 02/25/2022 (Approximate)   SpO2 95%   BMI 35.54 kg/m     Physical Exam Vitals reviewed.  Constitutional:      General: She is not in acute distress.    Appearance: She is ill-appearing.  HENT:     Head: Normocephalic and atraumatic.     Mouth/Throat:     Mouth: Mucous membranes are dry.     Pharynx: Oropharynx is clear.  Eyes:     Extraocular Movements: Extraocular movements intact.     Pupils: Pupils are equal, round, and reactive to light.  Cardiovascular:     Rate and Rhythm: Regular rhythm. Tachycardia present.     Pulses: Normal pulses.     Heart sounds: Normal heart sounds.  Pulmonary:     Effort: Pulmonary effort is normal. No respiratory distress.     Breath sounds: Normal breath sounds.  Abdominal:      General: There is distension.  Musculoskeletal:     Right lower leg: No edema.     Left lower leg: No edema.  Skin:    General: Skin is warm and dry.  Neurological:     Mental Status: She is alert and oriented to person, place, and time.  Psychiatric:        Mood and Affect: Mood normal.        Behavior: Behavior normal.        Thought Content: Thought content normal.        Judgment: Judgment normal.     Imaging: CT ABDOMEN PELVIS W CONTRAST  Result Date: 03/21/2022 CLINICAL DATA:  Abdominal pain, post-op Abdominal pain, acute, nonlocalized. Robotic salpingectomy 03/18/2022 EXAM: CT ABDOMEN AND PELVIS WITH CONTRAST TECHNIQUE: Multidetector CT imaging of the abdomen and pelvis was performed using the standard protocol following bolus administration of intravenous contrast. RADIATION DOSE REDUCTION: This exam was performed according to the departmental dose-optimization program which includes automated exposure control, adjustment of the mA and/or kV according to patient size and/or use of iterative reconstruction technique. CONTRAST:  169m OMNIPAQUE IOHEXOL 300 MG/ML  SOLN COMPARISON:  None Available. FINDINGS: Lower chest: Left base atelectasis.  No acute abnormality. Hepatobiliary: Liver is enlarged measuring up to 19 cm. The hepatic parenchyma is diffusely hypodense compared to the splenic parenchyma consistent with fatty infiltration. No focal liver abnormality. No gallstones, gallbladder wall thickening, or pericholecystic fluid. No biliary dilatation. Pancreas: No focal lesion. Normal pancreatic contour. No surrounding inflammatory changes. No main pancreatic ductal dilatation. Spleen: Normal in size without focal abnormality. Adrenals/Urinary Tract: No adrenal nodule bilaterally. Bilateral kidneys enhance symmetrically. No hydronephrosis. No hydroureter. The urinary bladder is unremarkable. On delayed imaging, there is no urothelial wall thickening and there are no filling defects in the  opacified portions of the bilateral collecting systems or ureters as well as urinary bladder. Stomach/Bowel: Stomach is within normal limits. No evidence of bowel wall thickening or dilatation. Appendix appears normal. Vascular/Lymphatic: No abdominal aorta or iliac aneurysm. No abdominal, pelvic, or inguinal lymphadenopathy. Reproductive: Uterus is unremarkable no adnexal mass identified. Other: No intraperitoneal free fluid. Small volume free intraperitoneal gas. There is a gas and fluid collection with peripheral enhancement measuring approximately 8 x 9 x 7.5 cm centered within the left rectouterine pouch. Musculoskeletal: No abdominal wall hernia or abnormality. No suspicious lytic or blastic osseous lesions. No acute displaced fracture. IMPRESSION: 1. An 8 x 9 x 7.5cm gas and fluid collection centered within the left rectouterine pouch likely represents a developing abscess. No findings to suggest bowel injury; however, this is not fully excluded. If a CT is repeated in the near future, consider use of IV and PO contrast. 2. Small volume pneumoperitoneum likely postsurgical in etiology. 3. Hepatomegaly and hepatic steatosis. Electronically Signed   By: MIven FinnM.D.   On: 03/21/2022 18:43    Labs:  CBC: Recent Labs    03/11/22 1542 03/21/22 1744 03/22/22 0543  WBC 9.1 16.8* 9.9  HGB 15.4* 15.3* 14.7  HCT 44.9 44.5 43.1  PLT 321 294 290    COAGS: No results for input(s): "INR", "APTT" in the last 8760 hours.  BMP: Recent Labs    03/21/22 1744 03/22/22 0924  NA 136 136  K 3.8 5.3*  CL 99 103  CO2 24 21*  GLUCOSE 144* 138*  BUN 13 26*  CALCIUM 9.8 8.2*  CREATININE 0.85 1.82*  GFRNONAA >60 37*    LIVER FUNCTION TESTS: Recent Labs    03/21/22 1744 03/22/22 0924  BILITOT 2.0* 4.9*  AST 31 27  ALT 49* 34  ALKPHOS 57 35*  PROT 7.8 6.2*  ALBUMIN 4.3 3.3*    TUMOR MARKERS: No results for input(s): "AFPTM", "CEA", "CA199", "CHROMGRNA" in the last 8760  hours.  Assessment and Plan: Ms. MGelardiis 3 days postop from robotic assisted laparoscopic surgery which included lysis of adhesions, drainage of 500 mL peritoneal cyst, left salpingectomy.  Patient presented to ED 03/21/2022 complaining of sudden severe abdominal pain.  CT abdomen pelvis was suspicious for pelvic abscess.  Patient was referred to IR by ARubie Maid MD for pelvic  abscess drain.  Procedure was approved by Dr. Denna Haggard, IR.  Pt in bed with husband and mother at bedside.  She c/o severe abdominal/ chest pain with associated SOB.  Pt is tachycardic and tachypneic.  RN at bedside states pt is now code sepsis. Dr. Denna Haggard, IR, made aware of pt status.   Risks and benefits of pelvic abscess drain with moderate sedation was discussed with the patient including bleeding, infection, damage to adjacent structures, bowel perforation/fistula connection, and sepsis.  All of the patient's/ patient's family's questions were answered, patient is agreeable to proceed.  Consent signed and in chart.   Thank you for this interesting consult.  I greatly enjoyed meeting WAVERLEY KREMPASKY and look forward to participating in their care.  A copy of this report was sent to the requesting provider on this date.  Electronically Signed: Tyson Alias, NP 03/22/2022, 10:36 AM   I spent a total of 20 minutes in face to face in clinical consultation, greater than 50% of which was counseling/coordinating care for pelvic abscess.

## 2022-03-22 NOTE — Progress Notes (Signed)
GYNECOLOGY INPATIENT PROGRESS NOTE  Post-Operative Day #4 s/p robotic assisted laparosocopic lysis of adhesions, left salpingectomy, drainage of large peritoneal cyst, chromotubation Hospital Day # 1, readmitted for suspected pelvic abscess, sepsis.   POD#0  s/p CT guided drainage of pelvic abscess  Subjective: Called to assess patient by nurse due to patient's persistent complaints of pain, now also with SOB and chest pain.  Desaturations noted to 40s when ambulating to the restroom.    Objective: Temp:  [97.6 F (36.4 C)-99.6 F (37.6 C)] 99.6 F (37.6 C) (07/28 1900) Pulse Rate:  [107-143] 127 (07/28 1753) Resp:  [20-35] 35 (07/28 1753) BP: (79-193)/(49-158) 105/59 (07/28 1900) SpO2:  [88 %-100 %] 96 % (07/28 1753)  Intake and Output incorrectly charted. Received 2L IVF bolus NS this morning followed by maintenance fluids at 125 ml/hr. Received IVF infusion in ER yesterday (no bolus documented)   Medications:  Scheduled Meds:  docusate sodium  100 mg Oral BID   sodium chloride flush  5 mL Intracatheter Q8H   vancomycin variable dose per unstable renal function (pharmacist dosing)   Does not apply See admin instructions   Continuous Infusions:  sodium chloride Stopped (03/22/22 1614)   sodium chloride Stopped (03/22/22 1328)   doxycycline (VIBRAMYCIN) IV Stopped (03/22/22 1124)   lactated ringers Stopped (03/22/22 0422)   piperacillin-tazobactam (ZOSYN)  IV 3.375 g (03/22/22 2113)   PRN Meds:.sodium chloride, albuterol, alum & mag hydroxide-simeth, bisacodyl, cyclobenzaprine, HYDROmorphone (DILAUDID) injection, ibuprofen, magnesium hydroxide, ondansetron **OR** ondansetron (ZOFRAN) IV, oxyCODONE, simethicone, traMADol, zolpidem    Physical Exam:  General: mild distress and pale, on 2L [place] Remainder of exam deferred. Patient in route to X-ray.   Labs:  Results for orders placed or performed during the hospital encounter of 03/21/22  Aerobic/Anaerobic Culture w  Gram Stain (surgical/deep wound)   Specimen: Wound; Abscess  Result Value Ref Range   Specimen Description      WOUND Performed at [facility], [address] [place], [place] [address]    Special Requests      ABD ABSCESS Performed at [facility], [place], [place] [address]    Gram Stain      ABUNDANT WBC PRESENT,BOTH PMN AND MONONUCLEAR ABUNDANT GRAM NEGATIVE RODS FEW GRAM POSITIVE RODS FEW GRAM POSITIVE COCCI IN PAIRS FEW BUDDING YEAST SEEN Performed at [facility], [place] [address] [place], [place] [address]    Culture PENDING    Report Status PENDING   Comprehensive metabolic panel  Result Value Ref Range   Sodium 136 135 - 145 mmol/L   Potassium 3.8 3.5 - 5.1 mmol/L   Chloride 99 98 - 111 mmol/L   CO2 24 22 - 32 mmol/L   Glucose, Bld 144 (H) 70 - 99 mg/dL   BUN 13 6 - 20 mg/dL   Creatinine, Ser 0.85 0.44 - 1.00 mg/dL   Calcium 9.8 8.9 - 10.3 mg/dL   Total Protein 7.8 6.5 - 8.1 g/dL   Albumin 4.3 3.5 - 5.0 g/dL   AST 31 15 - 41 U/L   ALT 49 (H) 0 - 44 U/L   Alkaline Phosphatase 57 38 - 126 U/L   Total Bilirubin 2.0 (H) 0.3 - 1.2 mg/dL   GFR, Estimated >60 >60 mL/min   Anion gap 13 5 - 15  CBC with Differential  Result Value Ref Range   WBC 16.8 (H) 4.0 - 10.5 K/uL   RBC 5.28 (H) 3.87 - 5.11 MIL/uL  Hemoglobin 15.3 (H) 12.0 - 15.0 g/dL   HCT 44.5 36.0 - 46.0 %   MCV 84.3 80.0 - 100.0 fL   MCH 29.0 26.0 - 34.0 pg   MCHC 34.4 30.0 - 36.0 g/dL   RDW 12.3 11.5 - 15.5 %   Platelets 294 150 - 400 K/uL   nRBC 0.0 0.0 - 0.2 %   Neutrophils Relative % 86 %   Neutro Abs 14.5 (H) 1.7 - 7.7 K/uL   Lymphocytes Relative 8 %   Lymphs Abs 1.3 0.7 - 4.0 K/uL   Monocytes Relative 5 %   Monocytes Absolute 0.8 0.1 - 1.0 K/uL   Eosinophils Relative 0 %   Eosinophils Absolute 0.0 0.0 - 0.5 K/uL   Basophils Relative 0 %   Basophils Absolute 0.1 0.0 - 0.1 K/uL   Immature Granulocytes 1 %   Abs Immature Granulocytes 0.10 (H) 0.00  - 0.07 K/uL  Urinalysis, Routine w reflex microscopic Urine, Clean Catch  Result Value Ref Range   Color, Urine YELLOW (A) YELLOW   APPearance CLEAR (A) CLEAR   Specific Gravity, Urine >1.046 (H) 1.005 - 1.030   pH 5.0 5.0 - 8.0   Glucose, UA NEGATIVE NEGATIVE mg/dL   Hgb urine dipstick LARGE (A) NEGATIVE   Bilirubin Urine NEGATIVE NEGATIVE   Ketones, ur NEGATIVE NEGATIVE mg/dL   Protein, ur NEGATIVE NEGATIVE mg/dL   Nitrite NEGATIVE NEGATIVE   Leukocytes,Ua NEGATIVE NEGATIVE   RBC / HPF >50 (H) 0 - 5 RBC/hpf   WBC, UA 11-20 0 - 5 WBC/hpf   Bacteria, UA NONE SEEN NONE SEEN   Squamous Epithelial / LPF 0-5 0 - 5   Mucus PRESENT   Lipase, blood  Result Value Ref Range   Lipase 26 11 - 51 U/L  CBC with Differential/Platelet  Result Value Ref Range   WBC 9.9 4.0 - 10.5 K/uL   RBC 5.06 3.87 - 5.11 MIL/uL   Hemoglobin 14.7 12.0 - 15.0 g/dL   HCT 43.1 36.0 - 46.0 %   MCV 85.2 80.0 - 100.0 fL   MCH 29.1 26.0 - 34.0 pg   MCHC 34.1 30.0 - 36.0 g/dL   RDW 12.5 11.5 - 15.5 %   Platelets 290 150 - 400 K/uL   nRBC 0.0 0.0 - 0.2 %   Neutrophils Relative % 84 %   Neutro Abs 8.3 (H) 1.7 - 7.7 K/uL   Lymphocytes Relative 6 %   Lymphs Abs 0.6 (L) 0.7 - 4.0 K/uL   Monocytes Relative 9 %   Monocytes Absolute 0.9 0.1 - 1.0 K/uL   Eosinophils Relative 0 %   Eosinophils Absolute 0.0 0.0 - 0.5 K/uL   Basophils Relative 0 %   Basophils Absolute 0.0 0.0 - 0.1 K/uL   WBC Morphology MILD LEFT SHIFT (1-5% METAS, OCC MYELO, OCC BANDS)    RBC Morphology MORPHOLOGY UNREMARKABLE    Smear Review Normal platelet morphology    Immature Granulocytes 1 %   Abs Immature Granulocytes 0.05 0.00 - 0.07 K/uL  Lactic acid, plasma  Result Value Ref Range   Lactic Acid, Venous 3.4 (HH) 0.5 - 1.9 mmol/L  Comprehensive metabolic panel  Result Value Ref Range   Sodium 136 135 - 145 mmol/L   Potassium 5.3 (H) 3.5 - 5.1 mmol/L   Chloride 103 98 - 111 mmol/L   CO2 21 (L) 22 - 32 mmol/L   Glucose, Bld 138 (H) 70  - 99 mg/dL   BUN 26 (H) 6 - 20  mg/dL   Creatinine, Ser 1.82 (H) 0.44 - 1.00 mg/dL   Calcium 8.2 (L) 8.9 - 10.3 mg/dL   Total Protein 6.2 (L) 6.5 - 8.1 g/dL   Albumin 3.3 (L) 3.5 - 5.0 g/dL   AST 27 15 - 41 U/L   ALT 34 0 - 44 U/L   Alkaline Phosphatase 35 (L) 38 - 126 U/L   Total Bilirubin 4.9 (H) 0.3 - 1.2 mg/dL   GFR, Estimated 37 (L) >60 mL/min   Anion gap 12 5 - 15  CBC with Differential  Result Value Ref Range   WBC 11.5 (H) 4.0 - 10.5 K/uL   RBC 4.75 3.87 - 5.11 MIL/uL   Hemoglobin 13.8 12.0 - 15.0 g/dL   HCT 40.2 36.0 - 46.0 %   MCV 84.6 80.0 - 100.0 fL   MCH 29.1 26.0 - 34.0 pg   MCHC 34.3 30.0 - 36.0 g/dL   RDW 12.6 11.5 - 15.5 %   Platelets 260 150 - 400 K/uL   nRBC 0.0 0.0 - 0.2 %   Neutrophils Relative % 86 %   Neutro Abs 10.0 (H) 1.7 - 7.7 K/uL   Lymphocytes Relative 5 %   Lymphs Abs 0.6 (L) 0.7 - 4.0 K/uL   Monocytes Relative 8 %   Monocytes Absolute 0.9 0.1 - 1.0 K/uL   Eosinophils Relative 0 %   Eosinophils Absolute 0.0 0.0 - 0.5 K/uL   Basophils Relative 0 %   Basophils Absolute 0.0 0.0 - 0.1 K/uL   WBC Morphology      MODERATE LEFT SHIFT (>5% METAS AND MYELOS,OCC PRO NOTED)   RBC Morphology MORPHOLOGY UNREMARKABLE    Smear Review PLATELETS APPEAR ADEQUATE    Immature Granulocytes 1 %   Abs Immature Granulocytes 0.07 0.00 - 0.07 K/uL  Lactic acid, plasma  Result Value Ref Range   Lactic Acid, Venous 3.3 (HH) 0.5 - 1.9 mmol/L  Lactic acid, plasma  Result Value Ref Range   Lactic Acid, Venous 4.9 (HH) 0.5 - 1.9 mmol/L  Protime-INR  Result Value Ref Range   Prothrombin Time 16.1 (H) 11.4 - 15.2 seconds   INR 1.3 (H) 0.8 - 1.2  APTT  Result Value Ref Range   aPTT 34 24 - 36 seconds      Assessment/Plan: Concern for patient's significant desaturations while on 2L [place].  Stat CXR ordered to r/o fluid overload.  Other concern would be for possible embolus.  Patient now with AKI, unable to perform CT scan due to elevated Creatinine. Consulted  Hospitalist who recommends consultation with ICU as concern may be for septic embolus, bordering septic shock (although BPs have remained more stable since fluid bolus). Patient continued on Vancomycin, Zosyn and Doxycycline IV. ICU to see patient and will likely transfer to unit.          Rubie Maid, MD [facility]

## 2022-03-22 NOTE — Sepsis Progress Note (Addendum)
Elink following code sepsis  1007- message sent to bedside RN asking for VS more frequent and asking for clarification on fluids. 2 liters ordered but one was charted as duplicate. First lactic resulted at 3.4  1325- SBP meeting needing for full fluid volume of 277m based off documented weight, message sent to bedside RN asking if MD can order an additional 7358mof fluids. Bedside RN responded pt is in a procedure and they were giving additional fluids  1615 messaged MD directly, 3rd lactic was drawn resulting at 4.9, I still only see 2 L of fluids given, asked if any additional fluids needed to be given, per bedside RN she reached out to MD to notify of lactic, MD does not want any repeats and is adding additional abx

## 2022-03-22 NOTE — Consult Note (Signed)
NAME:  Lacey Harrison, MRN:  [id number], DOB:  August 16, 1986, LOS: 1 ADMISSION DATE:  03/21/2022, CONSULTATION DATE:  03/22/22 REFERRING MD:  Dr. Marcelline Mates, CHIEF COMPLAINT:  Abdominal Pain   History of Present Illness:  36 yo F presenting to [facility] ED from home on 03/21/22 with complaints of sudden onset severe abdominal pain. She recently on 03/18/22 had robotic adhesiolysis, left salpingectomy, drainage of 500 mL from large LEFT peritoneal cyst & chromotubation. She was discharged home on 0/09 without complication. She reported being on the floor with her cat and twisting her body around, with sudden acute abdominal pain. Patient complained of severe pain in lower abdomen radiating towards her vagina. This pain was constant, unbearable without aggravating of alleviating factors. Patient denied and fever or chills, chest pain, dyspnea on admission.  ED course: CT imaging suspicious for abscess. Lab work significant for leukocytosis with left shift at 16.8 and ALT elevated at 49 but lower than previous lab work on 01/2021. Initial Vitals: 98.3, 19, 103, 128/72 & 95% on RA  Hospital Course: Patient admitted by OB/gyn and started on zosyn & doxycycline for post-operative abscess. 7/28 patient tachycardic and at times hypotensive, but remaining afebrile. She was also complaining of pain like her body is "locking up".  Lab work demonstrating new lactic acidosis, NAGMA and an AKI with mild hyperkalemia. CODE SEPSIS initiated, and IR placed pelvic abscess drain. She received a total of 3 L of IVF, broadening of antibiotics to include vancomycin and pain medication including tramadol, flexeril & dilaudid. Patient's dyspnea worsened with desaturations noted, due to concerns for respiratory status PCCM consulted.  Significant labs: (Labs/ Imaging personally reviewed) I, Domingo Pulse [place], AGACNP-BC, personally viewed and interpreted this ECG. EKG Interpretation: Date: 03/22/22, EKG Time: 22:59, Rate: 134,  Rhythm: ST, QRS Axis: normal, Intervals: normal, ST/T Wave abnormalities: non specific T wave inversion lead III, Narrative Interpretation: ST Chemistry: Na+:136, K+: 5.3, BUN/Cr.: 26/1.82, Serum CO2/ AG: 21/12, total bili: 4.9 Hematology: WBC: 11.5, Hgb: 13.8,  Lactic/ PCT: 3.4 > 3.3 > 4.9/ pending  CXR 03/22/22: (radiology noted persistent LLL airspace disease, concerning for pneumonia.) Very low lung volumes, cannot rule out opacity and pneumonia vs continued atelectasis from prior CT abdomen/pelvis CT abdomen/pelvis with contrast 03/21/22: An 8 x 9 x 7.5cm gas and fluid collection centered within the left rectouterine pouch likely represents a developing abscess. No findings to suggest bowel injury; however, this is not fully excluded. If a CT is repeated in the near future, consider use of IV and PO contrast. Small volume pneumoperitoneum likely postsurgical in etiology. Hepatomegaly and hepatic steatosis.   PCCM consulted for additional management and monitoring due to acute hypoxic respiratory distress & sepsis.  Pertinent  Medical History  Endometriosis by laparoscopic biopsy Anxiety Depression Lyme disease Migraines Heart murmur Significant Hospital Events: Including procedures, antibiotic start and stop dates in addition to other pertinent events   03/21/22: admit to mother/baby unit for treatment of suspected post-operative abscess 03/22/22: IR placed pelvic drain for post-operative abscess. CODE sepsis initiated, antibiotics broadened. PCCM consulted.  Interim History / Subjective:  Patient tachypneic & labored in 10/10 pain from ongoing muscle spasms throughout RIGHT flank/abdomen. She reports these spasms are travelling up her back to her neck. She has chronic spasms/pain in her back and neck but normally these are relieved with ice/heat. Oxygen is holding on 4 L [place], but it appears that due to pain she is unable to take deep breaths.  Discussed with attending Dr. Marcelline Mates and  the  patient was transferred to stepdown for additional management and monitoring.  Will need to get pain under better control to determine underlying respiratory status. Low suspicion for PE, as patient denies chest pain and respiratory status seems driven by musculoskeletal pain and underlying sepsis. discussed plan with Dr. Patsey Berthold who is in agreement with overall plan detailed below.  Objective   Blood pressure 106/66, pulse (!) 135, temperature 99.6 F (37.6 C), temperature source Oral, resp. rate (!) 29, height '5\' 3"'$  (1.6 m), weight 91 kg, last menstrual period 02/25/2022, SpO2 97 %.        Intake/Output Summary (Last 24 hours) at 03/22/2022 2220 Last data filed at 03/22/2022 2113 Gross per 24 hour  Intake 6817.79 ml  Output 625 ml  Net 6192.79 ml   Filed Weights   03/21/22 1722  Weight: 91 kg    Examination: General: Adult female, acutely ill, lying in bed, tachypneic in obvious pain with labored breathing HEENT: MM pink/moist, anicteric, atraumatic, neck supple Neuro: A&O x 4, able to follow commands, PERRL +3, MAE CV: s1s2 RRR, ST on monitor, no r/m/g Pulm: Regular, non labored on 2 L [place], breath sounds clear-BUL & diminished-BLL GI: soft, distended, tender, bs x 4 Skin: lap sites clean/dry, drain in place LLQ Extremities: warm/dry, pulses + 2 R/P, no edema noted  Resolved Hospital Problem list     Assessment & Plan:  Severe Sepsis without septic shock due to suspected post-operative pelvic abscess s/p pelvic drain placement Lactic: 3.4 > 3.3 > 4.9, Baseline PCT: pending Initial interventions/workup included: 3 L of NS/LR & Zosyn, Doxycycline & Vancomycin - STAT lactic, PCT, VBG - Supplemental oxygen as needed, to maintain SpO2 > 90% - f/u cultures, trend lactic/ PCT - Daily CBC, monitor WBC/ fever curve - continue current IV antibiotics: zosyn, doxycycline & vancomycin   - continue IVF hydration: NS @ 125 mL, consider additional IVF bolus PRN  Acute Hypoxic Respiratory  distress suspect secondary to musculoskeletal pain & atelectasis in the setting of severe sepsis Patient received 10 mg of Flexeril, tramadol & dilaudid without relief. Discussed with Pharmacist, methocarbamol IV not an option due to renal function. Will order Orphenadrine IV for muscle spasm relief (delay due to waiting to receive medication from [facility]) - will give valium IV with dilaudid PRN with hopes of providing some relief until other medication arrives - Supplemental O2 to maintain SpO2 > 90% - Intermittent chest x-ray & ABG PRN - Ensure adequate pulmonary hygiene  - bronchodilators PRN  Acute Kidney Injury multifocal in the setting of contrast & sepsis NAGMA Mild hyperkalemia Baseline Cr: 0.85, Cr current: 1.82 - STAT BMP, MG, Phos >> consider bicarbonate supplementation PRN - Strict I/O's: alert provider if UOP < 0.5 mL/kg/hr - gentle IVF hydration  - Daily BMP, replace electrolytes PRN - Avoid nephrotoxic agents as able, ensure adequate renal perfusion  Best Practice (right click and "Reselect all SmartList Selections" daily)  Diet/type: Regular consistency (see orders) DVT prophylaxis: prophylactic heparin  GI prophylaxis: N/A Lines: N/A Foley:  N/A Code Status:  full code Last date of multidisciplinary goals of care discussion [per primary]  Labs   CBC: Recent Labs  Lab 03/21/22 1744 03/22/22 0543 03/22/22 1048  WBC 16.8* 9.9 11.5*  NEUTROABS 14.5* 8.3* 10.0*  HGB 15.3* 14.7 13.8  HCT 44.5 43.1 40.2  MCV 84.3 85.2 84.6  PLT 294 290 196    Basic Metabolic Panel: Recent Labs  Lab 03/21/22 1744 03/22/22 0924  NA 136  136  K 3.8 5.3*  CL 99 103  CO2 24 21*  GLUCOSE 144* 138*  BUN 13 26*  CREATININE 0.85 1.82*  CALCIUM 9.8 8.2*   GFR: Estimated Creatinine Clearance: 46.2 mL/min (A) (by C-G formula based on SCr of 1.82 mg/dL (H)). Recent Labs  Lab 03/21/22 1744 03/22/22 0543 03/22/22 0924 03/22/22 1048 03/22/22 1403  WBC 16.8* 9.9  --  11.5*  --    LATICACIDVEN  --   --  3.4* 3.3* 4.9*    Liver Function Tests: Recent Labs  Lab 03/21/22 1744 03/22/22 0924  AST 31 27  ALT 49* 34  ALKPHOS 57 35*  BILITOT 2.0* 4.9*  PROT 7.8 6.2*  ALBUMIN 4.3 3.3*   Recent Labs  Lab 03/21/22 1744  LIPASE 26   No results for input(s): "AMMONIA" in the last 168 hours.  ABG No results found for: "PHART", "PCO2ART", "PO2ART", "HCO3", "TCO2", "ACIDBASEDEF", "O2SAT"   Coagulation Profile: Recent Labs  Lab 03/22/22 1048  INR 1.3*    Cardiac Enzymes: No results for input(s): "CKTOTAL", "CKMB", "CKMBINDEX", "TROPONINI" in the last 168 hours.  HbA1C: Hgb A1c MFr Bld  Date/Time Value Ref Range Status  01/31/2021 11:38 AM 5.1 4.6 - 6.5 % Final    Comment:    Glycemic Control Guidelines for People with Diabetes:Non Diabetic:  <6%Goal of Therapy: <7%Additional Action Suggested:  >8%     CBG: No results for input(s): "GLUCAP" in the last 168 hours.  Review of Systems: positives in BOLD  Gen: Denies fever, chills, weight change, fatigue, night sweats HEENT: Denies blurred vision, double vision, hearing loss, tinnitus, sinus congestion, rhinorrhea, sore throat, neck stiffness, dysphagia PULM: Denies shortness of breath, cough, sputum production, hemoptysis, wheezing CV: Denies chest pain, edema, orthopnea, paroxysmal nocturnal dyspnea, palpitations GI: Denies abdominal pain, nausea, vomiting, diarrhea, hematochezia, melena, constipation, change in bowel habits GU: Denies dysuria, hematuria, polyuria, oliguria, urethral discharge Endocrine: Denies hot or cold intolerance, polyuria, polyphagia or appetite change Derm: Denies rash, dry skin, scaling or peeling skin change Heme: Denies easy bruising, bleeding, bleeding gums Neuro: Denies headache, numbness, weakness, slurred speech, loss of memory or consciousness  Past Medical History:  She,  has a past medical history of Allergy, Anxiety, Chicken pox, Depression, Heart murmur, Lyme  disease, Migraines, and Pneumonia (07/2017).   Surgical History:   Past Surgical History:  Procedure Laterality Date   CHROMOPERTUBATION  02/16/2018   Procedure: CHROMOPERTUBATION;  Surgeon: Schermerhorn, Gwen Her, MD;  Location: [facility] ORS;  Service: Gynecology;;   CHROMOPERTUBATION Left 03/18/2022   Procedure: CHROMOPERTUBATION;  Surgeon: Rubie Maid, MD;  Location: [facility] ORS;  Service: Gynecology;  Laterality: Left;   CYSTECTOMY Left    OVARY   DILATION AND CURETTAGE OF UTERUS  2008   LAPAROSCOPIC LYSIS OF ADHESIONS N/A 02/16/2018   Procedure: EXTENSIVE LAPAROSCOPIC LYSIS OF ADHESIONS;  Surgeon: Schermerhorn, Gwen Her, MD;  Location: [facility] ORS;  Service: Gynecology;  Laterality: N/A;   LAPAROSCOPY N/A 02/16/2018   Procedure: LAPAROSCOPY OPERATIVE;  Surgeon: Schermerhorn, Gwen Her, MD;  Location: [facility] ORS;  Service: Gynecology;  Laterality: N/A;   LYSIS OF ADHESION N/A 03/18/2022   Procedure: LYSIS OF ADHESION;  Surgeon: Rubie Maid, MD;  Location: [facility] ORS;  Service: Gynecology;  Laterality: N/A;   XI ROBOTIC ASSISTED SALPINGECTOMY Left 03/18/2022   Procedure: XI ROBOTIC ASSISTED SALPINGECTOMY;  Surgeon: Rubie Maid, MD;  Location: [facility] ORS;  Service: Gynecology;  Laterality: Left;     Social History:   reports that she has been smoking  cigarettes. She has a 10.50 pack-year smoking history. She has never used smokeless tobacco. She reports that she does not currently use alcohol after a past usage of about 11.0 standard drinks of alcohol per week. She reports that she does not use drugs.   Family History:  Her family history includes Mental illness in her father, maternal grandmother, mother, and paternal grandmother.   Allergies Allergies  Allergen Reactions   Tylenol [Acetaminophen] Other (See Comments)    Lingering Taste     Home Medications  Prior to Admission medications   Medication Sig Start Date End Date Taking? Authorizing Provider  ibuprofen (ADVIL) 800 MG tablet Take  1 tablet (800 mg total) by mouth every 8 (eight) hours as needed. 03/18/22  Yes Rubie Maid, MD  oxyCODONE (ROXICODONE) 5 MG immediate release tablet Take 1 tablet (5 mg total) by mouth every 8 (eight) hours as needed. 03/18/22 03/18/23 Yes Rubie Maid, MD  simethicone (GAS-X) 80 MG chewable tablet Chew 1 tablet (80 mg total) by mouth 4 (four) times daily as needed for flatulence. 03/18/22 03/18/23 Yes Rubie Maid, MD  Vitamin D, Ergocalciferol, (DRISDOL) 1.25 MG (50000 UNIT) CAPS capsule Take 50,000 Units by mouth every 7 (seven) days.   Yes [provider]  albuterol (VENTOLIN HFA) 108 (90 Base) MCG/ACT inhaler Inhale 1-2 puffs into the lungs every 6 (six) hours as needed for wheezing or shortness of breath. 09/25/21   Hazel Sams, PA-C     Critical care time: 65 minutes       Venetia Night, AGACNP-BC Acute Care Nurse Practitioner [facility]   [phone] / [phone] Please see Amion for pager details.

## 2022-03-22 NOTE — Consult Note (Signed)
Pharmacy Antibiotic Note  Lacey Harrison is a 36 y.o. female admitted on 03/21/2022 3 days postop form robotic assisted laparoscopic surgery which included lysis of adhesions, drainage of 564m peritoneal cyst and left salpingectomy, now having sudden severe abdominal pain. CT of abdomen/pelvis was suspicious for pelvic abscess. Was on doxycycline and Zosyn. Patient became septic during hospitalization. Pharmacy has been consulted for Vancomycin dosing.  Plan: Vancomycin 2g IV x 1 dose. Patient currently in AKI(1.82, b/l<1) Will dose cautiously and follow renal function. Order Vancomycin random level~24 hours after dose given.  Height: '5\' 3"'$  (160 cm) Weight: 91 kg (200 lb 9.9 oz) IBW/kg (Calculated) : 52.4  Temp (24hrs), Avg:99 F (37.2 C), Min:97.6 F (36.4 C), Max:99.5 F (37.5 C)  Recent Labs  Lab 03/21/22 1744 03/22/22 0543 03/22/22 0924 03/22/22 1048 03/22/22 1403  WBC 16.8* 9.9  --  11.5*  --   CREATININE 0.85  --  1.82*  --   --   LATICACIDVEN  --   --  3.4* 3.3* 4.9*    Estimated Creatinine Clearance: 46.2 mL/min (A) (by C-G formula based on SCr of 1.82 mg/dL (H)).    Allergies  Allergen Reactions   Tylenol [Acetaminophen] Other (See Comments)    Lingering Taste    Antimicrobials this admission: Vancomycin 7/28 >>  Doxycycline/Zosyn 7/27 >>   Dose adjustments this admission: N/A  Microbiology results: 7/28 BCx: collected 7/28 abscess cx: collected  Thank you for allowing pharmacy to be a part of this patient's care.  WPearla Dubonnet7/28/2023 6:52 PM

## 2022-03-22 NOTE — Progress Notes (Signed)
Date and time results received: 03/22/22 '@1429'$  (use smartphrase ".now" to insert current time)  Test: Lactic Acid Critical Value: 4.9  Name of Provider Notified: Dr. Marcelline Mates  Orders Received? Or Actions Taken?: Orders Received - See Orders for details  Dr. Marcelline Mates ordered Vancomycin to be initiated, no repeat labs at this time.  CBC and CMP ordered for the am

## 2022-03-22 NOTE — Procedures (Signed)
Interventional Radiology Procedure Note  Date of Procedure: 03/22/2022  Procedure: CT drain placement into pelvic abscess   Findings:  1. CT drain placement into pelvic abscess, left anterior approach, 12 Fr to bulb suction    Complications: No immediate complications noted.   Estimated Blood Loss: minimal  Follow-up and Recommendations: 1. Flush 3x daily  2. Per surgeon    Albin Felling, MD  Vascular & Interventional Radiology  03/22/2022 12:54 PM

## 2022-03-23 ENCOUNTER — Inpatient Hospital Stay: Payer: No Typology Code available for payment source

## 2022-03-23 DIAGNOSIS — J9601 Acute respiratory failure with hypoxia: Secondary | ICD-10-CM | POA: Diagnosis not present

## 2022-03-23 DIAGNOSIS — R6520 Severe sepsis without septic shock: Secondary | ICD-10-CM | POA: Diagnosis not present

## 2022-03-23 DIAGNOSIS — R109 Unspecified abdominal pain: Secondary | ICD-10-CM

## 2022-03-23 DIAGNOSIS — N739 Female pelvic inflammatory disease, unspecified: Secondary | ICD-10-CM | POA: Diagnosis not present

## 2022-03-23 DIAGNOSIS — T8143XA Infection following a procedure, organ and space surgical site, initial encounter: Secondary | ICD-10-CM | POA: Diagnosis not present

## 2022-03-23 DIAGNOSIS — A419 Sepsis, unspecified organism: Secondary | ICD-10-CM | POA: Diagnosis not present

## 2022-03-23 DIAGNOSIS — N179 Acute kidney failure, unspecified: Secondary | ICD-10-CM

## 2022-03-23 LAB — COMPREHENSIVE METABOLIC PANEL
ALT: 23 U/L (ref 0–44)
AST: 26 U/L (ref 15–41)
Albumin: 2.6 g/dL — ABNORMAL LOW (ref 3.5–5.0)
Alkaline Phosphatase: 41 U/L (ref 38–126)
Anion gap: 9 (ref 5–15)
BUN: 16 mg/dL (ref 6–20)
CO2: 19 mmol/L — ABNORMAL LOW (ref 22–32)
Calcium: 7.2 mg/dL — ABNORMAL LOW (ref 8.9–10.3)
Chloride: 108 mmol/L (ref 98–111)
Creatinine, Ser: 0.82 mg/dL (ref 0.44–1.00)
GFR, Estimated: >60 mL/min (ref >60)
Glucose, Bld: 111 mg/dL — ABNORMAL HIGH (ref 70–99)
Potassium: 3.7 mmol/L (ref 3.5–5.1)
Sodium: 136 mmol/L (ref 135–145)
Total Bilirubin: 4 mg/dL — ABNORMAL HIGH (ref 0.3–1.2)
Total Protein: 5.5 g/dL — ABNORMAL LOW (ref 6.5–8.1)

## 2022-03-23 LAB — PROCALCITONIN
Procalcitonin: 27.66 ng/mL
Procalcitonin: 27.17 ng/mL

## 2022-03-23 LAB — CBC
HCT: 39.9 % (ref 36.0–46.0)
Hemoglobin: 13.6 g/dL (ref 12.0–15.0)
MCH: 29.1 pg (ref 26.0–34.0)
MCHC: 34.1 g/dL (ref 30.0–36.0)
MCV: 85.4 fL (ref 80.0–100.0)
Platelets: 199 10*3/uL (ref 150–400)
RBC: 4.67 MIL/uL (ref 3.87–5.11)
RDW: 13.1 % (ref 11.5–15.5)
WBC: 9 10*3/uL (ref 4.0–10.5)
nRBC: 0 % (ref 0.0–0.2)

## 2022-03-23 LAB — LACTIC ACID, PLASMA: Lactic Acid, Venous: 2.5 mmol/L (ref 0.5–1.9)

## 2022-03-23 LAB — MAGNESIUM: Magnesium: 2.6 mg/dL — ABNORMAL HIGH (ref 1.7–2.4)

## 2022-03-23 LAB — MRSA NEXT GEN BY PCR, NASAL: MRSA by PCR Next Gen: NOT DETECTED

## 2022-03-23 LAB — PHOSPHORUS: Phosphorus: 3.8 mg/dL (ref 2.5–4.6)

## 2022-03-23 MED ORDER — PANTOPRAZOLE SODIUM 40 MG IV SOLR
40.0000 mg | Freq: Every day | INTRAVENOUS | Status: DC
  Administered 2022-03-23 – 2022-03-25 (×3): 40 mg via INTRAVENOUS
  Filled 2022-03-23 (×3): qty 10

## 2022-03-23 MED ORDER — ALBUTEROL SULFATE (2.5 MG/3ML) 0.083% IN NEBU
2.5000 mg | INHALATION_SOLUTION | Freq: Four times a day (QID) | RESPIRATORY_TRACT | Status: DC
Start: 2022-03-23 — End: 2022-03-25
  Administered 2022-03-23: 2.5 mg via RESPIRATORY_TRACT
  Filled 2022-03-23 (×6): qty 3

## 2022-03-23 MED ORDER — IBUPROFEN 400 MG PO TABS: 600.0000 mg | ORAL_TABLET | Freq: Four times a day (QID) | ORAL | Status: DC | PRN

## 2022-03-23 MED ORDER — FLUCONAZOLE IN SODIUM CHLORIDE 400-0.9 MG/200ML-% IV SOLN
400.0000 mg | INTRAVENOUS | Status: AC
  Administered 2022-03-23 – 2022-04-05 (×13): 400 mg via INTRAVENOUS
  Filled 2022-03-23 (×15): qty 200

## 2022-03-23 MED ORDER — LACTATED RINGERS IV BOLUS: 1000.0000 mL | Freq: Once | INTRAVENOUS | Status: DC

## 2022-03-23 MED ORDER — METOCLOPRAMIDE HCL 10 MG/10ML PO SOLN
5.0000 mg | Freq: Once | ORAL | Status: AC
  Administered 2022-03-23: 5 mg via ORAL
  Filled 2022-03-23 (×3): qty 5

## 2022-03-23 MED ORDER — ORPHENADRINE CITRATE 30 MG/ML IJ SOLN
60.0000 mg | Freq: Two times a day (BID) | INTRAMUSCULAR | Status: AC
  Administered 2022-03-23 – 2022-03-24 (×3): 60 mg via INTRAVENOUS
  Filled 2022-03-23 (×6): qty 2

## 2022-03-23 MED ORDER — SODIUM BICARBONATE 8.4 % IV SOLN
INTRAVENOUS | Status: DC
  Filled 2022-03-23: qty 150
  Filled 2022-03-23 (×3): qty 1000

## 2022-03-23 MED ORDER — IOHEXOL 300 MG/ML  SOLN
80.0000 mL | Freq: Once | INTRAMUSCULAR | Status: AC | PRN
  Administered 2022-03-23: 80 mL via INTRAVENOUS

## 2022-03-23 MED ORDER — LACTATED RINGERS IV BOLUS
500.0000 mL | Freq: Once | INTRAVENOUS | Status: AC
  Administered 2022-03-23: 500 mL via INTRAVENOUS

## 2022-03-23 MED ORDER — DIAZEPAM 5 MG/ML IJ SOLN
5.0000 mg | Freq: Four times a day (QID) | INTRAMUSCULAR | Status: DC | PRN
  Administered 2022-03-23 – 2022-04-09 (×13): 5 mg via INTRAVENOUS
  Filled 2022-03-23 (×13): qty 2

## 2022-03-23 NOTE — Plan of Care (Signed)
Pt is alert and oriented x 4, VSS, afebrile, MAE equally.   Pt has been in increase pain all day and PRN given as ordered. K-Pad on, some relief with pain medication.   During abdominal CT, Pt was found to have urinal retention and foley was place with amber cloudy urine >1200 mls.   Family has been at bedside all day. Pt updated by Dr Patsey Berthold about Abdominal CT result.

## 2022-03-23 NOTE — Progress Notes (Signed)
eLink Physician-Brief Progress Note Patient Name: Lacey Harrison DOB: 1986-02-26 MRN: [id number]   Date of Service  03/23/2022  HPI/Events of Note  36 yo F admit 03/21/22 with severe abdominal pain. S/p 03/18/22 robotic adhesiolysis, left salpingectomy, drainage of 500 mL from large LEFT peritoneal cyst . Sudden acute abdominal pain at home after DC.   CT imaging suspicious for abscess. Lab work significant for leukocytosis with left shift at 16.8   Admitted by OB/gyn and started on zosyn & doxycycline for post-operative abscess. 7/28 : tachycardic and  hypotensive-> labs c/ lactic acidosis, NAGMA + AKI -> IR placed pelvic abscess drain.    CXR 03/22/22: LLL airspace disease, concerning for pneumonia.  CT abdomen/pelvis with contrast 03/21/22: An 8 x 9 x 7.5cm gas and fluid collection centered within the left rectouterine pouch likely represents a developing abscess.   eICU Interventions  Severe Sepsis / Hypotension  - Zosyn, Doxycycline & Vancomycin - RL and Albumin boluses as needed    Acute Hypoxic Respiratory distress suspected secondary to musculoskeletal pain & atelectasis/splinting due to location of abscess  - cautious administration of  valium IV with dilaudid PRN   - bronchodilators PRN   Acute Kidney Injury / NAGMA - Avoid nephrotoxic agents as able, ensure adequate renal perfusion -prefer LR for fluid replacement if possible         Lacey Harrison 03/23/2022, 1:41 AM

## 2022-03-23 NOTE — Progress Notes (Addendum)
GYNECOLOGY INPATIENT PROGRESS NOTE  Post-Operative Day #5 s/p robotic assisted laparosocopic lysis of adhesions, left salpingectomy, drainage of large peritoneal cyst, chromotubation Hospital Day # 2, readmitted for suspected pelvic abscess, developing sepsis.   POD#1 s/p CT guided drainage of pelvic abscess  Subjective: Patient reports feeling tight in her abdomen. Notes that she feels like she has intense gas pain that she cannot let out. Reports that it hurts to move.    Objective: Temp:  [98.8 F (37.1 C)-99.6 F (37.6 C)] 98.8 F (37.1 C) (07/29 0700) Pulse Rate:  [116-147] 116 (07/29 0900) Resp:  [17-38] 23 (07/29 0900) BP: (86-120)/(56-99) 109/71 (07/29 0900) SpO2:  [58 %-100 %] 93 % (07/29 0900)   Total I/O Last shift In: 539.7 [I.V.:486.2; IV Piggyback:53.5] Out: - Drains 65.     Medications:  Scheduled Meds:  Chlorhexidine Gluconate Cloth  6 each Topical Daily   docusate sodium  100 mg Oral BID   heparin injection (subcutaneous)  5,000 Units Subcutaneous Q8H   orphenadrine  60 mg Intravenous Q12H   sodium chloride flush  5 mL Intracatheter Q8H   vancomycin variable dose per unstable renal function (pharmacist dosing)   Does not apply See admin instructions   Continuous Infusions:  sodium chloride 125 mL/hr at 03/23/22 0948   sodium chloride Stopped (03/22/22 1328)   doxycycline (VIBRAMYCIN) IV 125 mL/hr at 03/23/22 0948   piperacillin-tazobactam (ZOSYN)  IV Stopped (03/23/22 0745)   sodium bicarbonate 150 mEq in dextrose 5 % 1,150 mL infusion 75 mL/hr at 03/23/22 0948   PRN Meds:.sodium chloride, albuterol, alum & mag hydroxide-simeth, bisacodyl, cyclobenzaprine, diazepam, HYDROmorphone (DILAUDID) injection, ibuprofen, ondansetron **OR** ondansetron (ZOFRAN) IV, oxyCODONE, simethicone, traMADol, zolpidem     Physical Exam:  General: alert and mild distress, on 2L [place] BP 109/71   Pulse (!) 116   Temp 98.8 F (37.1 C)   Resp (!) 23   Ht '5\' 3"'$  (1.6 m)    Wt 91 kg   LMP 02/25/2022 (Approximate)   SpO2 93%   BMI 35.54 kg/m  Lungs: clear to auscultation bilaterally Heart:  tachycardia, regular rhythm Abdomen:  soft, somewhat distended and tympanic, tender to palpation throughout abdomen. Bowel sounds present but slow Pelvic: deferred Extremities: extremities normal, atraumatic, no cyanosis or edema Skin: Skin color, texture, turgor normal. No rashes or lesions Neurologic: Grossly normal    Labs:      Latest Ref Rng & Units 03/23/2022    5:30 AM 03/22/2022   10:48 AM 03/22/2022    5:43 AM  CBC EXTENDED  WBC 4.0 - 10.5 K/uL 9.0  11.5  9.9   RBC 3.87 - 5.11 MIL/uL 4.67  4.75  5.06   Hemoglobin 12.0 - 15.0 g/dL 13.6  13.8  14.7   HCT 36.0 - 46.0 % 39.9  40.2  43.1   Platelets 150 - 400 K/uL 199  260  290   NEUT# 1.7 - 7.7 K/uL  10.0  8.3   Lymph# 0.7 - 4.0 K/uL  0.6  0.6         Latest Ref Rng & Units 03/23/2022    4:31 AM 03/22/2022   10:42 PM 03/22/2022    9:24 AM  CMP  Glucose 70 - 99 mg/dL 111  109  138   BUN 6 - 20 mg/dL '16  19  26   '$ Creatinine 0.44 - 1.00 mg/dL 0.82  0.89  1.82   Sodium 135 - 145 mmol/L 136  136  136  Potassium 3.5 - 5.1 mmol/L 3.7  4.1  5.3   Chloride 98 - 111 mmol/L 108  108  103   CO2 22 - 32 mmol/L '19  19  21   '$ Calcium 8.9 - 10.3 mg/dL 7.2  7.5  8.2   Total Protein 6.5 - 8.1 g/dL 5.5   6.2   Total Bilirubin 0.3 - 1.2 mg/dL 4.0   4.9   Alkaline Phos 38 - 126 U/L 41   35   AST 15 - 41 U/L 26   27   ALT 0 - 44 U/L 23   34     Results for orders placed or performed during the hospital encounter of 03/21/22  Culture, blood (x 2)   Specimen: BLOOD  Result Value Ref Range   Specimen Description BLOOD BLOOD LEFT HAND    Special Requests      BOTTLES DRAWN AEROBIC AND ANAEROBIC Blood Culture adequate volume   Culture      NO GROWTH < 24 HOURS Performed at [facility], [address], [place], [place] [address]    Report Status PENDING   Culture, blood (x 2)   Specimen: BLOOD   Result Value Ref Range   Specimen Description BLOOD BLOOD RIGHT HAND    Special Requests AEROBIC BOTTLE ONLY Blood Culture adequate volume    Culture      NO GROWTH < 24 HOURS Performed at [facility], [address], [place], [place] [address]    Report Status PENDING   Aerobic/Anaerobic Culture w Gram Stain (surgical/deep wound)   Specimen: Wound; Abscess  Result Value Ref Range   Specimen Description      WOUND Performed at [facility], [address] [place], [place] [address]    Special Requests      ABD ABSCESS Performed at [facility], [address] [place], [place] [address]    Gram Stain      ABUNDANT WBC PRESENT,BOTH PMN AND MONONUCLEAR ABUNDANT GRAM NEGATIVE RODS FEW GRAM POSITIVE RODS FEW GRAM POSITIVE COCCI IN PAIRS FEW BUDDING YEAST SEEN    Culture      ABUNDANT GRAM NEGATIVE RODS IDENTIFICATION AND SUSCEPTIBILITIES TO FOLLOW CULTURE REINCUBATED FOR BETTER GROWTH Performed at [facility], [place] [address] [place],  [address]    Report Status PENDING   MRSA Next Gen by PCR, Nasal   Specimen: Nasal Mucosa; Nasal Swab  Result Value Ref Range   MRSA by PCR Next Gen NOT DETECTED NOT DETECTED  Lipase, blood  Result Value Ref Range   Lipase 26 11 - 51 U/L  Protime-INR  Result Value Ref Range   Prothrombin Time 16.1 (H) 11.4 - 15.2 seconds   INR 1.3 (H) 0.8 - 1.2  APTT  Result Value Ref Range   aPTT 34 24 - 36 seconds  Magnesium  Result Value Ref Range   Magnesium 1.4 (L) 1.7 - 2.4 mg/dL  Procalcitonin - Baseline  Result Value Ref Range   Procalcitonin 27.66 ng/mL  Procalcitonin  Result Value Ref Range   Procalcitonin 27.17 ng/mL  Phosphorus  Result Value Ref Range   Phosphorus 3.6 2.5 - 4.6 mg/dL  Blood gas, venous  Result Value Ref Range   pH, Ven 7.26 7.25 - 7.43   pCO2, Ven 43 (L) 44 - 60 mmHg   pO2, Ven 53 (H) 32 - 45 mmHg   Bicarbonate 19.3 (L) 20.0 - 28.0 mmol/L   Acid-base deficit 7.5 (H)  0.0 -  2.0 mmol/L   O2 Saturation 81.6 %   Patient temperature 37.0    Collection site VENOUS    Drawn by OUTSIDE SERVICE OPLAB   Glucose, capillary  Result Value Ref Range   Glucose-Capillary 95 70 - 99 mg/dL  Magnesium  Result Value Ref Range   Magnesium 2.6 (H) 1.7 - 2.4 mg/dL  Phosphorus  Result Value Ref Range   Phosphorus 3.8 2.5 - 4.6 mg/dL    Imaging:  DG Chest 2 View CLINICAL DATA:  Shortness of breath.  EXAM: CHEST - 2 VIEW  COMPARISON:  Two-view chest x-ray 07/26/2016.  CT chest 03/21/2022  FINDINGS: Low lung volumes exaggerate the heart size. Left lower lobe airspace opacities are again noted. No other significant airspace disease is present.  IMPRESSION: Persistent left lower lobe airspace disease concerning for pneumonia.  Electronically Signed   By: San Morelle M.D.   On: 03/22/2022 21:46 CT IMAGE GUIDED DRAINAGE PERCUT CATH  PERITONEAL RETROPERIT INDICATION: Abdominopelvic abscess  EXAM: CT-guided placement of drainage catheter into abdominopelvic abscess  TECHNIQUE: Multidetector CT imaging of the abdomen and pelvis was performed following the standard protocol without IV contrast.  RADIATION DOSE REDUCTION: This exam was performed according to the departmental dose-optimization program which includes automated exposure control, adjustment of the mA and/or kV according to patient size and/or use of iterative reconstruction technique.  MEDICATIONS: Per EMR  ANESTHESIA/SEDATION: Local analgesia  COMPLICATIONS: None immediate.  PROCEDURE: Informed written consent was obtained from the patient after a thorough discussion of the procedural risks, benefits and alternatives. All questions were addressed. Maximal Sterile Barrier Technique was utilized including caps, mask, sterile gowns, sterile gloves, sterile drape, hand hygiene and skin antiseptic. A timeout was performed prior to the initiation of the procedure.  The patient  was placed supine on the exam table. Limited CT of the abdomen and pelvis was performed for planning purposes. This again demonstrated an air in fluid collection in the lower abdomen and pelvis. Skin entry site was marked with a planned anterolateral approach from the left lower quadrant. The overlying skin was prepped and draped in a standard sterile fashion. Local analgesia was obtained with 1% lidocaine. Using intermittent CT fluoroscopy, an 18 gauge trocar needle was advanced towards the identified abdominopelvic fluid collection. Access was confirmed with CT and return of purulent material. An 035 wire was then advanced through the access needle, over which the percutaneous tract was serially dilated to accommodate a 12 [place] multipurpose locking drainage catheter. Location was again confirmed with CT and return of additional purulent material. Catheter was secured to the skin using silk suture and a dressing. It was attached to bulb suction. The patient tolerated the procedure well without immediate complication.  IMPRESSION: Successful CT-guided placement of a 12 French locking drainage catheter into the lower abdominopelvic abscess. Sample sent to the lab for microbiology analysis.  Electronically Signed   By: Albin Felling M.D.   On: 03/22/2022 15:12    Assessment/Plan: Severe Sepsis  Continue IV antibiotic course (Zosyn, Vancomycin, Doxycyline).  WBCs remain wnl since yesterday.  Continue to monitor trend.  Patient has remained afebrile throughout admission Lactic acid trending downward, noting overall improvement Patient remains tachycardic, however also continues to complain of intense pain.  S/p CT guided drainage of suspected pelvic abscess yesterday, small amount of brownish thin fluid draining. Does not appear purulent. Cultures of fluid with gram positive rods, cocci, and some yeast.  Pending blood cultures.  Acute hypoxic respiratory distress Appears to be  resolving. Currently saturating > 90% on 2L [place]. Still appears to be doing some splinting this morning due to pain, however did well overnight with IV muscle relaxers and pain medication.  Acute kidney injury Appears to be resolving, Creatinine has trended back to normal (from 1.82 down to 0.82 which is patient's baseline) Concern for ileus vs SBO  Patient now with physical exam concerning for developing ileus vs SBO due to abdominal distention and patient's pain. Currently attempting to drink PO contrast for repeat CT scan, will make NPO after this. Has denied nausea and vomiting during this admission thus far.    A total of 40 minutes were spent during this encounter, including review of previous progress notes, recent imaging and labs, face-to-face with time with patient involving counseling and coordination of care, as well as documentation for current visit.   Rubie Maid, MD [facility]

## 2022-03-23 NOTE — Progress Notes (Signed)
NAME:  Lacey Harrison, MRN:  [id number], DOB:  02/05/1986, LOS: 2 ADMISSION DATE:  03/21/2022, CONSULTATION DATE:  03/22/22 REFERRING MD:  Dr. Marcelline Mates, CHIEF COMPLAINT:  Abdominal Pain   History of Present Illness:  36 yo F presenting to [facility] ED from home on 03/21/22 with complaints of sudden onset severe abdominal pain. She recently on 03/18/22 had robotic adhesiolysis, left salpingectomy, drainage of 500 mL from large LEFT peritoneal cyst & chromotubation. She was discharged home on 9/03 without complication. She reported being on the floor with her cat and twisting her body around, with sudden acute abdominal pain. Patient complained of severe pain in lower abdomen radiating towards her vagina. This pain was constant, unbearable without aggravating of alleviating factors. Patient denied and fever or chills, chest pain, dyspnea on admission.  ED course: CT imaging suspicious for abscess. Lab work significant for leukocytosis with left shift at 16.8 and ALT elevated at 49 but lower than previous lab work on 01/2021. Initial Vitals: 98.3, 19, 103, 128/72 & 95% on RA  Hospital Course: Patient admitted by OB/gyn and started on zosyn & doxycycline for post-operative abscess. 7/28 patient tachycardic and at times hypotensive, but remaining afebrile. She was also complaining of pain like her body is "locking up".  Lab work demonstrating new lactic acidosis, NAGMA and an AKI with mild hyperkalemia. CODE SEPSIS initiated, and IR placed pelvic abscess drain. She received a total of 3 L of IVF, broadening of antibiotics to include vancomycin and pain medication including tramadol, flexeril & dilaudid. Patient's dyspnea worsened with desaturations noted, due to concerns for respiratory status PCCM consulted.  Significant labs:  EKG Interpretation: Date: 03/22/22, EKG Time: 22:59, Rate: 134, Rhythm: ST, QRS Axis: normal, Intervals: normal, ST/T Wave abnormalities: non specific T wave inversion lead III, Narrative  Interpretation: ST Chemistry: Na+:136, K+: 5.3, BUN/Cr.: 26/1.82, Serum CO2/ AG: 21/12, total bili: 4.9 Hematology: WBC: 11.5, Hgb: 13.8,  Lactic/ PCT: 3.4 > 3.3 > 4.9/ pending  CXR 03/22/22: (radiology noted persistent LLL airspace disease, concerning for pneumonia.) Very low lung volumes, cannot rule out opacity and pneumonia vs continued atelectasis from prior CT abdomen/pelvis CT abdomen/pelvis with contrast 03/21/22: An 8 x 9 x 7.5cm gas and fluid collection centered within the left rectouterine pouch likely represents a developing abscess. No findings to suggest bowel injury; however, this is not fully excluded. If a CT is repeated in the near future, consider use of IV and PO contrast. Small volume pneumoperitoneum likely postsurgical in etiology. Hepatomegaly and hepatic steatosis.   PCCM consulted for additional management and monitoring due to acute hypoxic respiratory distress & sepsis.  Pertinent  Medical History  Endometriosis by laparoscopic biopsy Anxiety Depression Lyme disease Migraines Heart murmur Significant Hospital Events: Including procedures, antibiotic start and stop dates in addition to other pertinent events   03/21/22: admit to mother/baby unit for treatment of suspected post-operative abscess 03/22/22: IR placed pelvic drain for post-operative abscess. CODE sepsis initiated, antibiotics broadened. PCCM consulted for transfered to stepdown 03/23/22: Remains in stepdown.  Developing ileus, query feculent appearance on abscess drain, repeat CT abdomen pelvis performed, new loculations on the right, anasarca, distended urinary bladder  Interim History / Subjective:  Developed significant atelectasis due to splinting from pain.  Pain now under better control but she is developing ileus will make n.p.o. except for ice chips.   Objective   Blood pressure 112/74, pulse (!) 118, temperature 98.8 F (37.1 C), resp. rate (!) 21, height '5\' 3"'$  (1.6 m), weight 91  kg, last  menstrual period 02/25/2022, SpO2 96 %.        Intake/Output Summary (Last 24 hours) at 03/23/2022 1609 Last data filed at 03/23/2022 1606 Gross per 24 hour  Intake 7007.73 ml  Output 1735 ml  Net 5272.73 ml    Filed Weights   03/21/22 1722  Weight: 91 kg    Examination: General: Adult female, acutely ill, lying in bed, not tachypneic. HEENT: MM pink/moist, anicteric, atraumatic, neck supple Neuro: A&O x 4, able to follow commands, PERRL +3, MAE CV: s1s2 RRR, ST on monitor, no r/m/g Pulm: Regular, non labored on 2 L [place], breath sounds clear-BUL & diminished-BLL GI: soft, distended, tender, bs x 4 Skin: lap sites clean/dry, drain in place LLQ Extremities: warm/dry, pulses + 2 R/P, anasarca noted  Resolved Hospital Problem list     Assessment & Plan:  Severe Sepsis without septic shock due to suspected post-operative pelvic abscess s/p pelvic drain placement Lactic: 3.4 > 3.3 > 4.9 > 2.9,  PCT: 27.66 > 27.17 Initial interventions/workup included: 3 L of NS/LR & Zosyn, Doxycycline & Vancomycin - STAT lactic, PCT, VBG - Supplemental oxygen as needed, to maintain SpO2 > 90% - f/u cultures, trend lactic/ PCT - Daily CBC, monitor WBC/ fever curve - continue IV antibiotics: Continue Zosyn, added fluconazole due to budding yeast noted on abscess fluid - Discontinue doxycycline, vancomycin x1 - continue IVF hydration: We will continue bicarb infusion, DC other IV fluids due to anasarca - Repeat CT abdomen and pelvis with oral and IV contrast to exclude bowel extravasation  Acute Hypoxic Respiratory distress suspect secondary to musculoskeletal pain & atelectasis in the setting of severe sepsis Patient received 10 mg of Flexeril, tramadol & dilaudid without relief. Discussed with Pharmacist, methocarbamol IV not an option due to renal function. Will order Orphenadrine IV for muscle spasm relief (delay due to waiting to receive medication from [facility]) - will give valium IV with dilaudid  PRN with hopes of providing some relief until other medication arrives - Supplemental O2 to maintain SpO2 > 90% - Intermittent chest x-ray & ABG PRN - Ensure adequate pulmonary hygiene  - bronchodilators scheduled - Incentive spirometry  Acute Kidney Injury multifocal in the setting of contrast & sepsis NAGMA Mild hyperkalemia Baseline Cr: 0.85, Cr current: 1.82 - STAT BMP, MG, Phos >> continue sodium bicarb infusion - Strict I/O's: alert provider if UOP < 0.5 mL/kg/hr - Other fluids to KVO due to anasarca - Daily BMP, replace electrolytes PRN - Avoid nephrotoxic agents as able, ensure adequate renal perfusion  Best Practice (right click and "Reselect all SmartList Selections" daily)  Diet/type: Regular consistency (see orders) DVT prophylaxis: prophylactic heparin  GI prophylaxis: PPI Lines: N/A Foley:  Yes, and it is still needed Code Status:  full code Last date of multidisciplinary goals of care discussion [per primary]  Updated patient's husband, Alesia Richards, at bedside.  Labs   CBC: Recent Labs  Lab 03/21/22 1744 03/22/22 0543 03/22/22 1048 03/23/22 0530  WBC 16.8* 9.9 11.5* 9.0  NEUTROABS 14.5* 8.3* 10.0*  --   HGB 15.3* 14.7 13.8 13.6  HCT 44.5 43.1 40.2 39.9  MCV 84.3 85.2 84.6 85.4  PLT 294 290 260 199     Basic Metabolic Panel: Recent Labs  Lab 03/21/22 1744 03/22/22 0924 03/22/22 2242 03/23/22 0431  NA 136 136 136 136  K 3.8 5.3* 4.1 3.7  CL 99 103 108 108  CO2 24 21* 19* 19*  GLUCOSE 144* 138* 109* 111*  BUN 13 26* 19 16  CREATININE 0.85 1.82* 0.89 0.82  CALCIUM 9.8 8.2* 7.5* 7.2*  MG  --   --  1.4* 2.6*  PHOS  --   --  3.6 3.8    GFR: Estimated Creatinine Clearance: 102.5 mL/min (by C-G formula based on SCr of 0.82 mg/dL). Recent Labs  Lab 03/21/22 1744 03/22/22 0543 03/22/22 0924 03/22/22 1048 03/22/22 1403 03/22/22 2242 03/23/22 0431 03/23/22 0530  PROCALCITON  --   --   --   --   --  27.66 27.17  --   WBC 16.8* 9.9  --   11.5*  --   --   --  9.0  LATICACIDVEN  --   --    < > 3.3* 4.9* 2.9* 2.5*  --    < > = values in this interval not displayed.     Liver Function Tests: Recent Labs  Lab 03/21/22 1744 03/22/22 0924 03/23/22 0431  AST '31 27 26  '$ ALT 49* 34 23  ALKPHOS 57 35* 41  BILITOT 2.0* 4.9* 4.0*  PROT 7.8 6.2* 5.5*  ALBUMIN 4.3 3.3* 2.6*    Recent Labs  Lab 03/21/22 1744  LIPASE 26    No results for input(s): "AMMONIA" in the last 168 hours.  ABG    Component Value Date/Time   HCO3 19.3 (L) 03/22/2022 2242   ACIDBASEDEF 7.5 (H) 03/22/2022 2242   O2SAT 81.6 03/22/2022 2242     Coagulation Profile: Recent Labs  Lab 03/22/22 1048  INR 1.3*     Cardiac Enzymes: No results for input(s): "CKTOTAL", "CKMB", "CKMBINDEX", "TROPONINI" in the last 168 hours.  HbA1C: Hgb A1c MFr Bld  Date/Time Value Ref Range Status  01/31/2021 11:38 AM 5.1 4.6 - 6.5 % Final    Comment:    Glycemic Control Guidelines for People with Diabetes:Non Diabetic:  <6%Goal of Therapy: <7%Additional Action Suggested:  >8%     CBG: Recent Labs  Lab 03/22/22 2223  GLUCAP 95    Review of Systems: positives in BOLD  A 10 point review of systems was performed and it is as noted above otherwise negative.  Past Medical History:  She,  has a past medical history of Allergy, Anxiety, Chicken pox, Depression, Heart murmur, Lyme disease, Migraines, and Pneumonia (07/2017).   Surgical History:   Past Surgical History:  Procedure Laterality Date   CHROMOPERTUBATION  02/16/2018   Procedure: CHROMOPERTUBATION;  Surgeon: Schermerhorn, Gwen Her, MD;  Location: [facility] ORS;  Service: Gynecology;;   CHROMOPERTUBATION Left 03/18/2022   Procedure: CHROMOPERTUBATION;  Surgeon: Rubie Maid, MD;  Location: [facility] ORS;  Service: Gynecology;  Laterality: Left;   CYSTECTOMY Left    OVARY   DILATION AND CURETTAGE OF UTERUS  2008   LAPAROSCOPIC LYSIS OF ADHESIONS N/A 02/16/2018   Procedure: EXTENSIVE LAPAROSCOPIC LYSIS OF  ADHESIONS;  Surgeon: Schermerhorn, Gwen Her, MD;  Location: [facility] ORS;  Service: Gynecology;  Laterality: N/A;   LAPAROSCOPY N/A 02/16/2018   Procedure: LAPAROSCOPY OPERATIVE;  Surgeon: Schermerhorn, Gwen Her, MD;  Location: [facility] ORS;  Service: Gynecology;  Laterality: N/A;   LYSIS OF ADHESION N/A 03/18/2022   Procedure: LYSIS OF ADHESION;  Surgeon: Rubie Maid, MD;  Location: [facility] ORS;  Service: Gynecology;  Laterality: N/A;   XI ROBOTIC ASSISTED SALPINGECTOMY Left 03/18/2022   Procedure: XI ROBOTIC ASSISTED SALPINGECTOMY;  Surgeon: Rubie Maid, MD;  Location: [facility] ORS;  Service: Gynecology;  Laterality: Left;     Social History:   reports that she has been smoking cigarettes.  She has a 10.50 pack-year smoking history. She has never used smokeless tobacco. She reports that she does not currently use alcohol after a past usage of about 11.0 standard drinks of alcohol per week. She reports that she does not use drugs.   Family History:  Her family history includes Mental illness in her father, maternal grandmother, mother, and paternal grandmother.   Allergies Allergies  Allergen Reactions   Tylenol [Acetaminophen] Other (See Comments)    Lingering Taste     Home Medications  Prior to Admission medications   Medication Sig Start Date End Date Taking? Authorizing Provider  ibuprofen (ADVIL) 800 MG tablet Take 1 tablet (800 mg total) by mouth every 8 (eight) hours as needed. 03/18/22  Yes Rubie Maid, MD  oxyCODONE (ROXICODONE) 5 MG immediate release tablet Take 1 tablet (5 mg total) by mouth every 8 (eight) hours as needed. 03/18/22 03/18/23 Yes Rubie Maid, MD  simethicone (GAS-X) 80 MG chewable tablet Chew 1 tablet (80 mg total) by mouth 4 (four) times daily as needed for flatulence. 03/18/22 03/18/23 Yes Rubie Maid, MD  Vitamin D, Ergocalciferol, (DRISDOL) 1.25 MG (50000 UNIT) CAPS capsule Take 50,000 Units by mouth every 7 (seven) days.   Yes [provider]  albuterol  (VENTOLIN HFA) 108 (90 Base) MCG/ACT inhaler Inhale 1-2 puffs into the lungs every 6 (six) hours as needed for wheezing or shortness of breath. 09/25/21   Hazel Sams, PA-C     Scheduled Meds:  Chlorhexidine Gluconate Cloth  6 each Topical Daily   docusate sodium  100 mg Oral BID   heparin injection (subcutaneous)  5,000 Units Subcutaneous Q8H   orphenadrine  60 mg Intravenous Q12H   sodium chloride flush  5 mL Intracatheter Q8H   Continuous Infusions:  sodium chloride Stopped (03/23/22 1529)   sodium chloride Stopped (03/22/22 1328)   fluconazole (DIFLUCAN) IV 100 mL/hr at 03/23/22 1600   piperacillin-tazobactam (ZOSYN)  IV 3.375 g (03/23/22 1324)   sodium bicarbonate 150 mEq in dextrose 5 % 1,150 mL infusion 75 mL/hr at 03/23/22 1600   PRN Meds:.sodium chloride, albuterol, alum & mag hydroxide-simeth, bisacodyl, cyclobenzaprine, diazepam, HYDROmorphone (DILAUDID) injection, ibuprofen, ondansetron **OR** ondansetron (ZOFRAN) IV, oxyCODONE, simethicone, traMADol, zolpidem   Critical care time: 45 minutes    Discussed with Dr. Rubie Maid, PCCM will assume care while patient in stepdown/ICU.    Renold Don, MD Advanced Bronchoscopy PCCM [facility]    *This note was dictated using voice recognition software/Dragon.  Despite best efforts to proofread, errors can occur which can change the meaning. Any transcriptional errors that result from this process are unintentional and may not be fully corrected at the time of dictation.

## 2022-03-24 ENCOUNTER — Inpatient Hospital Stay: Payer: No Typology Code available for payment source

## 2022-03-24 DIAGNOSIS — T8143XA Infection following a procedure, organ and space surgical site, initial encounter: Secondary | ICD-10-CM | POA: Diagnosis not present

## 2022-03-24 LAB — URINALYSIS, ROUTINE W REFLEX MICROSCOPIC
Hgb urine dipstick: NEGATIVE
Specific Gravity, Urine: 1.045 — ABNORMAL HIGH (ref 1.005–1.030)
Squamous Epithelial / HPF: NONE SEEN (ref 0–5)

## 2022-03-24 LAB — COMPREHENSIVE METABOLIC PANEL
ALT: 21 U/L (ref 0–44)
AST: 20 U/L (ref 15–41)
Albumin: 2.4 g/dL — ABNORMAL LOW (ref 3.5–5.0)
Alkaline Phosphatase: 62 U/L (ref 38–126)
Anion gap: 9 (ref 5–15)
BUN: 11 mg/dL (ref 6–20)
CO2: 26 mmol/L (ref 22–32)
Calcium: 7.6 mg/dL — ABNORMAL LOW (ref 8.9–10.3)
Chloride: 98 mmol/L (ref 98–111)
Creatinine, Ser: 0.68 mg/dL (ref 0.44–1.00)
GFR, Estimated: >60 mL/min (ref >60)
Glucose, Bld: 119 mg/dL — ABNORMAL HIGH (ref 70–99)
Potassium: 3 mmol/L — ABNORMAL LOW (ref 3.5–5.1)
Sodium: 133 mmol/L — ABNORMAL LOW (ref 135–145)
Total Bilirubin: 3.3 mg/dL — ABNORMAL HIGH (ref 0.3–1.2)
Total Protein: 5.6 g/dL — ABNORMAL LOW (ref 6.5–8.1)

## 2022-03-24 LAB — MAGNESIUM: Magnesium: 2.6 mg/dL — ABNORMAL HIGH (ref 1.7–2.4)

## 2022-03-24 LAB — CBC
HCT: 36.4 % (ref 36.0–46.0)
Hemoglobin: 12.3 g/dL (ref 12.0–15.0)
MCH: 28.5 pg (ref 26.0–34.0)
MCHC: 33.8 g/dL (ref 30.0–36.0)
MCV: 84.3 fL (ref 80.0–100.0)
Platelets: 213 10*3/uL (ref 150–400)
RBC: 4.32 MIL/uL (ref 3.87–5.11)
RDW: 12.7 % (ref 11.5–15.5)
WBC: 13.3 10*3/uL — ABNORMAL HIGH (ref 4.0–10.5)
nRBC: 0.2 % (ref 0.0–0.2)

## 2022-03-24 LAB — PROCALCITONIN: Procalcitonin: 16.44 ng/mL

## 2022-03-24 LAB — PHOSPHORUS: Phosphorus: 1.7 mg/dL — ABNORMAL LOW (ref 2.5–4.6)

## 2022-03-24 LAB — LACTIC ACID, PLASMA: Lactic Acid, Venous: 1.3 mmol/L (ref 0.5–1.9)

## 2022-03-24 MED ORDER — DEXTROSE 5 % IV SOLN
30.0000 mmol | Freq: Once | INTRAVENOUS | Status: AC
  Administered 2022-03-24: 30 mmol via INTRAVENOUS
  Filled 2022-03-24: qty 10

## 2022-03-24 MED ORDER — M.V.I. ADULT IV INJ
Freq: Once | INTRAVENOUS | Status: AC
  Filled 2022-03-24: qty 10

## 2022-03-24 MED ORDER — CLINDAMYCIN PHOSPHATE 900 MG/50ML IV SOLN
900.0000 mg | Freq: Three times a day (TID) | INTRAVENOUS | Status: DC
  Administered 2022-03-24 – 2022-03-25 (×2): 900 mg via INTRAVENOUS
  Filled 2022-03-24 (×3): qty 50

## 2022-03-24 MED ORDER — POTASSIUM CHLORIDE 10 MEQ/100ML IV SOLN
10.0000 meq | INTRAVENOUS | Status: AC
  Administered 2022-03-24 (×2): 10 meq via INTRAVENOUS
  Filled 2022-03-24 (×2): qty 100

## 2022-03-24 MED ORDER — MORPHINE SULFATE (PF) 2 MG/ML IV SOLN
2.0000 mg | INTRAVENOUS | Status: DC | PRN
  Administered 2022-03-24 – 2022-03-26 (×11): 2 mg via INTRAVENOUS
  Filled 2022-03-24 (×11): qty 1

## 2022-03-24 MED ORDER — KETOROLAC TROMETHAMINE 15 MG/ML IJ SOLN
15.0000 mg | Freq: Four times a day (QID) | INTRAMUSCULAR | Status: DC | PRN
  Administered 2022-03-24 – 2022-03-26 (×8): 15 mg via INTRAVENOUS
  Filled 2022-03-24 (×8): qty 1

## 2022-03-24 NOTE — Plan of Care (Addendum)
Reviewed microbiology data from abscess drainage.  Main organism is E. coli, Clostridium perfringens was identified, she also has budding yeast identified.  She is currently on Zosyn and fluconazole.  We will add clindamycin.  Pharmacy notified.  Will repeat abdomen pelvis CT in the morning.  Patient and family apprised.  Renold Don, MD Advanced Bronchoscopy PCCM [facility]

## 2022-03-24 NOTE — Progress Notes (Signed)
GYNECOLOGY PROGRESS NOTE  Called in by patient's nurse due to family concerns for care.  They are upset noting that "her pain does not seem to be controlled and they feel she is declining".  They are worried and feel like she needs surgery right away.  Worried that patient may need to be transferred to another facility for further care.   I had a lengthy discussion with the patient and her family (husband, mother, and mother-in-law).  I discussed with patient her entire admission course and all of the interventions that have occurred so far.  I reviewed each of her laboratory findings and imaging in detail.  I discussed that although slow, patient was making steady progress.  Her breathing has improved, her abdomen does appear less distended with NG tube, and her labs are overall still trending in the right direction.  Patient does have a history of musculoskeletal (and possible neuropathic) pain, which may be compounding her pain issues.  Family notes longer thinks that the Dilaudid is helping.  I discussed that we can change her pain medications.  I also discussed that high doses or frequent administration of pain medication can also affect her bowel motility which could compound her suspected ileus.  I advised that surgery is an option however we would like to wait until at least 24 to 48 hours before making this decision as surgery does not come without its own risks and complications.  If she continues to not show improvement then can consider surgical intervention at that time.  After discussion with patient's family, they feel better about her plan of care.  They do have questions whether patient will receive physical therapy as she has been lying in the bed for several days.  I reassured them that once patient was feeling better, that we would work on helping her to become ambulatory again and that may include physical therapy.  We will work to change patient's pain medications around, will discontinue  the Dilaudid and initiate either morphine or fentanyl.  Also can consider medications such as IV neuropathics to help with her pain.    A total of 48 minutes were spent during this encounter, including review of imaging and labs, face-to-face with time with patient and family involving counseling and coordination of care, as well as documentation for current encounter.   Rubie Maid, MD [facility]

## 2022-03-24 NOTE — Progress Notes (Signed)
NAME:  Lacey Harrison, MRN:  [id number], DOB:  Aug 24, 1986, LOS: 3 ADMISSION DATE:  03/21/2022, CONSULTATION DATE:  03/22/22 REFERRING MD:  Dr. Marcelline Mates, CHIEF COMPLAINT:  Abdominal Pain   History of Present Illness:  36 yo F presenting to [facility] ED from home on 03/21/22 with complaints of sudden onset severe abdominal pain. She recently on 03/18/22 had robotic adhesiolysis, left salpingectomy, drainage of 500 mL from large LEFT peritoneal cyst & chromotubation. She was discharged home on 2/50 without complication. She reported being on the floor with her cat and twisting her body around, with sudden acute abdominal pain. Patient complained of severe pain in lower abdomen radiating towards her vagina. This pain was constant, unbearable without aggravating of alleviating factors. Patient denied and fever or chills, chest pain, dyspnea on admission.  ED course: CT imaging suspicious for abscess. Lab work significant for leukocytosis with left shift at 16.8 and ALT elevated at 49 but lower than previous lab work on 01/2021. Initial Vitals: 98.3, 19, 103, 128/72 & 95% on RA  Hospital Course: Patient admitted by OB/gyn and started on zosyn & doxycycline for post-operative abscess. 7/28 patient tachycardic and at times hypotensive, but remaining afebrile. She was also complaining of pain like her body is "locking up".  Lab work demonstrating new lactic acidosis, NAGMA and an AKI with mild hyperkalemia. CODE SEPSIS initiated, and IR placed pelvic abscess drain. She received a total of 3 L of IVF, broadening of antibiotics to include vancomycin and pain medication including tramadol, flexeril & dilaudid. Patient's dyspnea worsened with desaturations noted, due to concerns for respiratory status PCCM consulted.  Significant labs:  EKG Interpretation: Date: 03/22/22, EKG Time: 22:59, Rate: 134, Rhythm: ST, QRS Axis: normal, Intervals: normal, ST/T Wave abnormalities: non specific T wave inversion lead III, Narrative  Interpretation: ST Chemistry: Na+:136, K+: 5.3, BUN/Cr.: 26/1.82, Serum CO2/ AG: 21/12, total bili: 4.9 Hematology: WBC: 11.5, Hgb: 13.8,  Lactic/ PCT: 3.4 > 3.3 > 4.9/ pending  CXR 03/22/22: (radiology noted persistent LLL airspace disease, concerning for pneumonia.) Very low lung volumes, cannot rule out opacity and pneumonia vs continued atelectasis from prior CT abdomen/pelvis CT abdomen/pelvis with contrast 03/21/22: An 8 x 9 x 7.5cm gas and fluid collection centered within the left rectouterine pouch likely represents a developing abscess. No findings to suggest bowel injury; however, this is not fully excluded. If a CT is repeated in the near future, consider use of IV and PO contrast. Small volume pneumoperitoneum likely postsurgical in etiology. Hepatomegaly and hepatic steatosis.   PCCM consulted for additional management and monitoring due to acute hypoxic respiratory distress & sepsis.  Pertinent  Medical History  Endometriosis by laparoscopic biopsy Anxiety Depression Lyme disease Migraines Heart murmur Significant Hospital Events: Including procedures, antibiotic start and stop dates in addition to other pertinent events   03/21/22: admit to mother/baby unit for treatment of suspected post-operative abscess 03/22/22: IR placed pelvic drain for post-operative abscess. CODE sepsis initiated, antibiotics broadened. PCCM consulted for transfered to stepdown 03/23/22: Remains in stepdown.  Developing ileus, query feculent appearance on abscess drain, repeat CT abdomen pelvis performed, new loculations on the right, anasarca, distended urinary bladder. No contrast extravasation to suggest bowel perforation 03/24/22: Continues to have abdominal pain, required NG last night due to recurrent vomiting.  Had 1200 mL of bilious material suctioned from stomach.  NG in place, abdomen less distended  Interim History / Subjective:  Due to persistent nausea and vomiting required NG placement, 1200 mL  of bilious material  suctioned from stomach.  Pain now under better control but distended, she has developed ileus continue n.p.o. status except for ice chips.  Despite pain and ileus feels hungry.  Objective   Blood pressure 115/85, pulse (!) 110, temperature 99.5 F (37.5 C), temperature source Axillary, resp. rate (!) 28, height '5\' 3"'$  (1.6 m), weight 91 kg, last menstrual period 02/25/2022, SpO2 94 %.        Intake/Output Summary (Last 24 hours) at 03/24/2022 0901 Last data filed at 03/24/2022 0500 Gross per 24 hour  Intake 4036.73 ml  Output 2990 ml  Net 1046.73 ml    Filed Weights   03/21/22 1722  Weight: 91 kg    Examination: General: Adult female, acutely ill, lying in bed, not tachypneic. HEENT: MM pink/moist, anicteric, atraumatic, neck supple Neuro: A&O x 4, able to follow commands, PERRL +3, MAE CV: s1s2 RRR, ST on monitor, no r/m/g Pulm: Regular, non labored on 2 L [place], breath sounds clear-BUL & diminished-BLL GI: soft, distended though less so, tender, bs diminished x 4 Skin: lap sites clean/dry, drain in place LLQ, draining foul appearing material Extremities: warm/dry, pulses + 2 R/P, mild anasarca noted  Resolved Hospital Problem list     Assessment & Plan:  Severe Sepsis without septic shock due to post-operative pelvic abscess s/p pelvic drain placement Lactic: 3.4 > 3.3 > 4.9 > 2.9,  PCT: 27.66 > 27.17 > 16.44 Initial interventions/workup included: 3 L of NS/LR & Zosyn & Fluconazole  - STAT lactic, PCT, VBG - Supplemental oxygen as needed, to maintain SpO2 > 90% - f/u cultures, trend lactic/ PCT - Daily CBC, monitor WBC/ fever curve - continue IV antibiotics: Continue Zosyn, and antifungal, fluconazole - continue IVF hydration: D5LR+ MVI - Repeat CT abdomen and pelvis with oral and IV contrast tomorrow or Tues to f/u on abscess  Acute Hypoxic Respiratory distress suspect secondary to musculoskeletal pain & atelectasis in the setting of severe  sepsis Patient received 10 mg of Flexeril, tramadol & dilaudid without relief. Discussed with Pharmacist, methocarbamol IV not an option due to renal function. Will order Orphenadrine IV for muscle spasm relief - Supplemental O2 to maintain SpO2 > 90% - Intermittent chest x-ray & ABG PRN - Ensure adequate pulmonary hygiene  - bronchodilators scheduled - Incentive spirometry  Acute Kidney Injury multifocal in the setting of contrast & sepsis NAGMA - RESOLVED Hypokalemia, Hypophosphatemia - due to volume resuscitation/electrolyte shifts Baseline Cr: 0.85, Cr current: 0.68 - STAT BMP, MG, Phos >> discontinued NaBicarb. Replete K and phos - Strict I/O's: alert provider if UOP < 0.5 mL/kg/hr - Daily Renal panel, replace electrolytes PRN - Avoid nephrotoxic agents as able, ensure adequate renal perfusion  Best Practice (right click and "Reselect all SmartList Selections" daily)  Diet/type: NPO DVT prophylaxis: prophylactic heparin  GI prophylaxis: PPI Lines: N/A Foley:  Yes, and it is still needed Code Status:  full code Last date of multidisciplinary goals of care discussion [N/A]  Updated patient's husband, Alesia Richards, at bedside.  Labs   CBC: Recent Labs  Lab 03/21/22 1744 03/22/22 0543 03/22/22 1048 03/23/22 0530 03/24/22 0447  WBC 16.8* 9.9 11.5* 9.0 13.3*  NEUTROABS 14.5* 8.3* 10.0*  --   --   HGB 15.3* 14.7 13.8 13.6 12.3  HCT 44.5 43.1 40.2 39.9 36.4  MCV 84.3 85.2 84.6 85.4 84.3  PLT 294 290 260 199 213     Basic Metabolic Panel: Recent Labs  Lab 03/21/22 1744 03/22/22 0924 03/22/22 2242 03/23/22 0431 03/24/22  0447  NA 136 136 136 136 133*  K 3.8 5.3* 4.1 3.7 3.0*  CL 99 103 108 108 98  CO2 24 21* 19* 19* 26  GLUCOSE 144* 138* 109* 111* 119*  BUN 13 26* '19 16 11  '$ CREATININE 0.85 1.82* 0.89 0.82 0.68  CALCIUM 9.8 8.2* 7.5* 7.2* 7.6*  MG  --   --  1.4* 2.6* 2.6*  PHOS  --   --  3.6 3.8 1.7*    GFR: Estimated Creatinine Clearance: 105.1 mL/min (by  C-G formula based on SCr of 0.68 mg/dL). Recent Labs  Lab 03/22/22 0543 03/22/22 0924 03/22/22 1048 03/22/22 1403 03/22/22 2242 03/23/22 0431 03/23/22 0530 03/24/22 0447  PROCALCITON  --   --   --   --  27.66 27.17  --  16.44  WBC 9.9  --  11.5*  --   --   --  9.0 13.3*  LATICACIDVEN  --    < > 3.3* 4.9* 2.9* 2.5*  --   --    < > = values in this interval not displayed.     Liver Function Tests: Recent Labs  Lab 03/21/22 1744 03/22/22 0924 03/23/22 0431 03/24/22 0447  AST '31 27 26 20  '$ ALT 49* 34 23 21  ALKPHOS 57 35* 41 62  BILITOT 2.0* 4.9* 4.0* 3.3*  PROT 7.8 6.2* 5.5* 5.6*  ALBUMIN 4.3 3.3* 2.6* 2.4*    Recent Labs  Lab 03/21/22 1744  LIPASE 26    No results for input(s): "AMMONIA" in the last 168 hours.  ABG    Component Value Date/Time   HCO3 19.3 (L) 03/22/2022 2242   ACIDBASEDEF 7.5 (H) 03/22/2022 2242   O2SAT 81.6 03/22/2022 2242     Coagulation Profile: Recent Labs  Lab 03/22/22 1048  INR 1.3*     Cardiac Enzymes: No results for input(s): "CKTOTAL", "CKMB", "CKMBINDEX", "TROPONINI" in the last 168 hours.  HbA1C: Hgb A1c MFr Bld  Date/Time Value Ref Range Status  01/31/2021 11:38 AM 5.1 4.6 - 6.5 % Final    Comment:    Glycemic Control Guidelines for People with Diabetes:Non Diabetic:  <6%Goal of Therapy: <7%Additional Action Suggested:  >8%     CBG: Recent Labs  Lab 03/22/22 2223  GLUCAP 95     Review of Systems: positives in BOLD  A 10 point review of systems was performed and it is as noted above otherwise negative.  Allergies Allergies  Allergen Reactions   Tylenol [Acetaminophen] Other (See Comments)    Lingering Taste     Home Medications  Prior to Admission medications   Medication Sig Start Date End Date Taking? Authorizing Provider  ibuprofen (ADVIL) 800 MG tablet Take 1 tablet (800 mg total) by mouth every 8 (eight) hours as needed. 03/18/22  Yes Rubie Maid, MD  oxyCODONE (ROXICODONE) 5 MG immediate release  tablet Take 1 tablet (5 mg total) by mouth every 8 (eight) hours as needed. 03/18/22 03/18/23 Yes Rubie Maid, MD  simethicone (GAS-X) 80 MG chewable tablet Chew 1 tablet (80 mg total) by mouth 4 (four) times daily as needed for flatulence. 03/18/22 03/18/23 Yes Rubie Maid, MD  Vitamin D, Ergocalciferol, (DRISDOL) 1.25 MG (50000 UNIT) CAPS capsule Take 50,000 Units by mouth every 7 (seven) days.   Yes [provider]  albuterol (VENTOLIN HFA) 108 (90 Base) MCG/ACT inhaler Inhale 1-2 puffs into the lungs every 6 (six) hours as needed for wheezing or shortness of breath. 09/25/21   Hazel Sams, PA-C  Scheduled Meds:  albuterol  2.5 mg Nebulization Q6H   Chlorhexidine Gluconate Cloth  6 each Topical Daily   docusate sodium  100 mg Oral BID   heparin injection (subcutaneous)  5,000 Units Subcutaneous Q8H   orphenadrine  60 mg Intravenous Q12H   pantoprazole (PROTONIX) IV  40 mg Intravenous QHS   sodium chloride flush  5 mL Intracatheter Q8H   Continuous Infusions:  sodium chloride Stopped (03/22/22 1328)   fluconazole (DIFLUCAN) IV Stopped (03/23/22 1729)   M.V.I. Adult (INFUVITE ADULT) 10 mL in dextrose 5% lactated ringers 1,000 mL infusion     piperacillin-tazobactam (ZOSYN)  IV 3.375 g (03/24/22 0351)   potassium chloride 10 mEq (03/24/22 0803)   potassium PHOSPHATE IVPB (in mmol) 30 mmol (03/24/22 0900)   PRN Meds:.sodium chloride, alum & mag hydroxide-simeth, bisacodyl, cyclobenzaprine, diazepam, HYDROmorphone (DILAUDID) injection, ibuprofen, ondansetron **OR** ondansetron (ZOFRAN) IV, oxyCODONE, simethicone, traMADol, zolpidem   Level 3 follow-up      C. Derrill Kay, MD Advanced Bronchoscopy PCCM [facility]    *This note was dictated using voice recognition software/Dragon.  Despite best efforts to proofread, errors can occur which can change the meaning. Any transcriptional errors that result from this process are unintentional and may not be  fully corrected at the time of dictation.

## 2022-03-24 NOTE — Progress Notes (Signed)
Pharmacy Antibiotic Note  Lacey Harrison is a 36 y.o. female admitted on 03/21/2022 with  Abdominal abscess .  Pharmacy has been consulted for Clindamycin dosing. Culture results from abscess show E.coli, yeast, and now Clostridium perfringens. Patient is currently on Zosyn and Fluconazole.  Plan: Adding Clindamycin '900mg'$  IV q8h  Height: '5\' 3"'$  (160 cm) Weight: 91 kg (200 lb 9.9 oz) IBW/kg (Calculated) : 52.4  Temp (24hrs), Avg:98.7 F (37.1 C), Min:98 F (36.7 C), Max:99.5 F (37.5 C)  Recent Labs  Lab 03/21/22 1744 03/22/22 0543 03/22/22 0924 03/22/22 0924 03/22/22 1048 03/22/22 1403 03/22/22 2242 03/23/22 0431 03/23/22 0530 03/24/22 0447 03/24/22 0908  WBC 16.8* 9.9  --   --  11.5*  --   --   --  9.0 13.3*  --   CREATININE 0.85  --  1.82*  --   --   --  0.89 0.82  --  0.68  --   LATICACIDVEN  --   --  3.4*   < > 3.3* 4.9* 2.9* 2.5*  --   --  1.3   < > = values in this interval not displayed.    Estimated Creatinine Clearance: 105.1 mL/min (by C-G formula based on SCr of 0.68 mg/dL).    Allergies  Allergen Reactions   Tylenol [Acetaminophen] Other (See Comments)    Lingering Taste    Antimicrobials this admission: Zosyn 7/27 >>  Doxycycline 7/27 >> 7/29 Vancomycin 7/28 x 1 Fluconazole 7/29 >> Clindamycin 7/30 >>  Dose adjustments this admission:   Microbiology results: 7/28 BCx: NGTD 7/30 UCx: pending  7/28 Surgical Cx (abscess): E.coli (pan S), Clostridium perfringens, Yeast   7/28 MRSA PCR: Negative  Thank you for allowing pharmacy to be a part of this patient's care.  Paulina Fusi, PharmD, BCPS 03/24/2022 7:19 PM

## 2022-03-25 ENCOUNTER — Inpatient Hospital Stay: Payer: No Typology Code available for payment source

## 2022-03-25 DIAGNOSIS — T8143XA Infection following a procedure, organ and space surgical site, initial encounter: Secondary | ICD-10-CM

## 2022-03-25 DIAGNOSIS — K567 Ileus, unspecified: Secondary | ICD-10-CM

## 2022-03-25 DIAGNOSIS — R6520 Severe sepsis without septic shock: Secondary | ICD-10-CM | POA: Insufficient documentation

## 2022-03-25 DIAGNOSIS — J9601 Acute respiratory failure with hypoxia: Secondary | ICD-10-CM

## 2022-03-25 DIAGNOSIS — A419 Sepsis, unspecified organism: Secondary | ICD-10-CM | POA: Insufficient documentation

## 2022-03-25 DIAGNOSIS — N179 Acute kidney failure, unspecified: Secondary | ICD-10-CM

## 2022-03-25 LAB — COMPREHENSIVE METABOLIC PANEL
ALT: 14 U/L (ref 0–44)
AST: 18 U/L (ref 15–41)
Albumin: 2.1 g/dL — ABNORMAL LOW (ref 3.5–5.0)
Alkaline Phosphatase: 49 U/L (ref 38–126)
Anion gap: 8 (ref 5–15)
BUN: 14 mg/dL (ref 6–20)
CO2: 30 mmol/L (ref 22–32)
Calcium: 7.6 mg/dL — ABNORMAL LOW (ref 8.9–10.3)
Chloride: 97 mmol/L — ABNORMAL LOW (ref 98–111)
Creatinine, Ser: 0.82 mg/dL (ref 0.44–1.00)
GFR, Estimated: >60 mL/min (ref >60)
Glucose, Bld: 81 mg/dL (ref 70–99)
Potassium: 3 mmol/L — ABNORMAL LOW (ref 3.5–5.1)
Sodium: 135 mmol/L (ref 135–145)
Total Bilirubin: 3.4 mg/dL — ABNORMAL HIGH (ref 0.3–1.2)
Total Protein: 5.1 g/dL — ABNORMAL LOW (ref 6.5–8.1)

## 2022-03-25 LAB — AEROBIC/ANAEROBIC CULTURE W GRAM STAIN (SURGICAL/DEEP WOUND)

## 2022-03-25 LAB — CBC
HCT: 32.3 % — ABNORMAL LOW (ref 36.0–46.0)
Hemoglobin: 10.8 g/dL — ABNORMAL LOW (ref 12.0–15.0)
MCH: 28.8 pg (ref 26.0–34.0)
MCHC: 33.4 g/dL (ref 30.0–36.0)
MCV: 86.1 fL (ref 80.0–100.0)
Platelets: 189 10*3/uL (ref 150–400)
RBC: 3.75 MIL/uL — ABNORMAL LOW (ref 3.87–5.11)
RDW: 13.1 % (ref 11.5–15.5)
WBC: 13 10*3/uL — ABNORMAL HIGH (ref 4.0–10.5)
nRBC: 0.2 % (ref 0.0–0.2)

## 2022-03-25 LAB — URINE CULTURE
Culture: NO GROWTH
Special Requests: NORMAL

## 2022-03-25 LAB — PHOSPHORUS: Phosphorus: 2.2 mg/dL — ABNORMAL LOW (ref 2.5–4.6)

## 2022-03-25 LAB — MAGNESIUM: Magnesium: 2.4 mg/dL (ref 1.7–2.4)

## 2022-03-25 MED ORDER — POTASSIUM CHLORIDE 10 MEQ/100ML IV SOLN
10.0000 meq | INTRAVENOUS | Status: AC
  Administered 2022-03-25 (×4): 10 meq via INTRAVENOUS
  Filled 2022-03-25 (×4): qty 100

## 2022-03-25 MED ORDER — SODIUM CHLORIDE 0.9 % IV SOLN
3.0000 g | Freq: Four times a day (QID) | INTRAVENOUS | Status: DC
  Administered 2022-03-25 – 2022-04-01 (×27): 3 g via INTRAVENOUS
  Filled 2022-03-25 (×2): qty 3
  Filled 2022-03-25: qty 8
  Filled 2022-03-25: qty 3
  Filled 2022-03-25 (×3): qty 8
  Filled 2022-03-25: qty 3
  Filled 2022-03-25 (×3): qty 8
  Filled 2022-03-25 (×5): qty 3
  Filled 2022-03-25 (×4): qty 8
  Filled 2022-03-25 (×2): qty 3
  Filled 2022-03-25: qty 8
  Filled 2022-03-25: qty 3
  Filled 2022-03-25 (×2): qty 8
  Filled 2022-03-25 (×2): qty 3
  Filled 2022-03-25 (×3): qty 8
  Filled 2022-03-25: qty 3

## 2022-03-25 MED ORDER — ALBUTEROL SULFATE (2.5 MG/3ML) 0.083% IN NEBU: 2.5000 mg | INHALATION_SOLUTION | Freq: Four times a day (QID) | RESPIRATORY_TRACT | Status: DC | PRN

## 2022-03-25 MED ORDER — ALBUMIN HUMAN 25 % IV SOLN
25.0000 g | Freq: Four times a day (QID) | INTRAVENOUS | Status: AC
  Administered 2022-03-25 (×2): 25 g via INTRAVENOUS
  Filled 2022-03-25 (×4): qty 100

## 2022-03-25 MED ORDER — IOHEXOL 9 MG/ML PO SOLN
500.0000 mL | ORAL | Status: AC
  Administered 2022-03-25 (×2): 500 mL via ORAL

## 2022-03-25 MED ORDER — POTASSIUM PHOSPHATES 15 MMOLE/5ML IV SOLN
15.0000 mmol | Freq: Once | INTRAVENOUS | Status: AC
  Administered 2022-03-25: 15 mmol via INTRAVENOUS
  Filled 2022-03-25: qty 5

## 2022-03-25 MED ORDER — IOHEXOL 300 MG/ML  SOLN
100.0000 mL | Freq: Once | INTRAMUSCULAR | Status: AC | PRN
  Administered 2022-03-25: 100 mL via INTRAVENOUS

## 2022-03-25 NOTE — TOC Initial Note (Signed)
Transition of Care [facility]) - Initial/Assessment Note    Patient Details  Name: Lacey Harrison MRN: [id number] Date of Birth: 1985-12-08  Transition of Care [facility]) CM/SW Contact:    Shelbie Hutching, RN Phone Number: 03/25/2022, 10:24 AM  Clinical Narrative:                 Patient admitted to the hospital with postop intra- abdominal abscess.  Patient is from home with her husband.  Patient had surgery on 7/24- salpingectomy, lysis of adhesions, presented on 7/27 to the emergency room with uncontrolled pain.    TOC will follow for discharge needs.  Patient will be evaluated by PT once feeling better and pain more controlled.    Expected Discharge Plan:  (TBD) Barriers to Discharge: Continued Medical Work up   Patient Goals and CMS Choice        Expected Discharge Plan and Services Expected Discharge Plan:  (TBD)       Living arrangements for the past 2 months: Single Family Home                                      Prior Living Arrangements/Services Living arrangements for the past 2 months: Single Family Home Lives with:: Spouse Patient language and need for interpreter reviewed:: Yes        Need for Family Participation in Patient Care: Yes (Comment) Care giver support system in place?: Yes (comment)   Criminal Activity/Legal Involvement Pertinent to Current Situation/Hospitalization: No - Comment as needed  Activities of Daily Living Home Assistive Devices/Equipment: None ADL Screening (condition at time of admission) Patient's cognitive ability adequate to safely complete daily activities?: Yes Is the patient deaf or have difficulty hearing?: No Does the patient have difficulty seeing, even when wearing glasses/contacts?: No Does the patient have difficulty concentrating, remembering, or making decisions?: No Patient able to express need for assistance with ADLs?: Yes Does the patient have difficulty dressing or bathing?: No Independently performs ADLs?:  Yes (appropriate for developmental age) Does the patient have difficulty walking or climbing stairs?: No Weakness of Legs: None Weakness of Arms/Hands: None  Permission Sought/Granted                  Emotional Assessment Appearance:: Appears stated age     Orientation: : Oriented to Self, Oriented to Place, Oriented to  Time, Oriented to Situation Alcohol / Substance Use: Not Applicable Psych Involvement: No (comment)  Admission diagnosis:  Postoperative intra-abdominal abscess [T81.43XA] Pelvic abscess in female [N73.9] Abdominal pain, unspecified abdominal location [R10.9] Patient Active Problem List   Diagnosis Date Noted   Postoperative intra-abdominal abscess 03/21/2022   Hydrosalpinx    Infertility, female    Pelvic adhesive disease    History of ovarian cyst 11/21/2020   Menstrual irregularity 11/21/2020   Vitamin D deficiency 11/21/2020   Screening for blood or protein in urine 11/21/2020   Fatigue 11/21/2020   Swollen lymph nodes 11/21/2020   Otitis externa 11/21/2020   Seasonal allergies 11/21/2020   Tobacco use 07/12/2019   Lyme disease 02/02/2019   Migraines 02/02/2019   Depression 03/07/2016   PCP:  Rubie Maid, MD Pharmacy:   [id number] [id number]: [phone]: [phone]    Social Determinants of Health (SDOH) Interventions    Readmission Risk Interventions  No data to display

## 2022-03-25 NOTE — Assessment & Plan Note (Signed)
   Trend BMP  I&O close monitor UOP

## 2022-03-25 NOTE — Progress Notes (Signed)
GYNECOLOGY INPATIENT PROGRESS NOTE  Post-Operative Day #7 s/p robotic assisted laparosocopic lysis of adhesions, left salpingectomy, drainage of large peritoneal cyst, chromotubation Hospital Day # 4, readmitted for suspected pelvic abscess, developing sepsis.   POD#3 s/p CT guided drainage of pelvic abscess  Subjective: Patient has questions regarding plan of care.  Currently drinking PO contrast. Pain currently 4-5/10.    Objective: Temp:  [98.3 F (36.8 C)-99.2 F (37.3 C)] 99.2 F (37.3 C) (07/31 0820) Pulse Rate:  [86-109] 86 (07/31 0800) Resp:  [18-22] 20 (07/31 0400) BP: (91-118)/(60-82) 96/68 (07/31 0800) SpO2:  [92 %-99 %] 97 % (07/31 0800)    I/O last 3 completed shifts: In: 1870.3 [I.V.:1121.5; IV Piggyback:748.8] Out: 3532 [DJMEQ:6834; Emesis/NG output:1400; Drains:265] No intake/output data recorded.     Medications:  Scheduled Meds:  Chlorhexidine Gluconate Cloth  6 each Topical Daily   docusate sodium  100 mg Oral BID   heparin injection (subcutaneous)  5,000 Units Subcutaneous Q8H   iohexol  500 mL Oral Q1H   orphenadrine  60 mg Intravenous Q12H   pantoprazole (PROTONIX) IV  40 mg Intravenous QHS   sodium chloride flush  5 mL Intracatheter Q8H   Continuous Infusions:  sodium chloride Stopped (03/22/22 1328)   albumin human 25 g (03/25/22 0811)   clindamycin (CLEOCIN) IV 900 mg (03/25/22 0929)   fluconazole (DIFLUCAN) IV 400 mg (03/24/22 1632)   piperacillin-tazobactam (ZOSYN)  IV 3.375 g (03/25/22 0440)   potassium chloride 10 mEq (03/25/22 0956)   potassium PHOSPHATE IVPB (in mmol) 15 mmol (03/25/22 0541)   PRN Meds:.sodium chloride, albuterol, alum & mag hydroxide-simeth, bisacodyl, diazepam, ketorolac, morphine injection, ondansetron **OR** ondansetron (ZOFRAN) IV, simethicone, zolpidem     Physical Exam:  General: alert and no acute distress , on 2L [place] BP 96/68   Pulse 86   Temp 99.2 F (37.3 C) (Oral)   Resp 20   Ht '5\' 3"'$  (1.6 m)    Wt 91 kg   LMP 02/25/2022 (Approximate)   SpO2 97%   BMI 35.54 kg/m  Lungs: clear to auscultation bilaterally Heart:  tachycardia, regular rhythm Abdomen: normal findings: no masses palpable and soft and abnormal findings:  guarding and minimal distention present (improved compared to yesterday's exam.  Incision sites covered with dressing, intact, no drainage.  Pelvic: deferred Extremities: extremities normal, atraumatic, no cyanosis or edema Skin: Skin color, texture, turgor normal. No rashes or lesions Neurologic: Grossly normal    Labs:      Latest Ref Rng & Units 03/25/2022    3:53 AM 03/24/2022    4:47 AM 03/23/2022    5:30 AM  CBC EXTENDED  WBC 4.0 - 10.5 K/uL 13.0  13.3  9.0   RBC 3.87 - 5.11 MIL/uL 3.75  4.32  4.67   Hemoglobin 12.0 - 15.0 g/dL 10.8  12.3  13.6   HCT 36.0 - 46.0 % 32.3  36.4  39.9   Platelets 150 - 400 K/uL 189  213  199         Latest Ref Rng & Units 03/25/2022    3:53 AM 03/24/2022    4:47 AM 03/23/2022    4:31 AM  CMP  Glucose 70 - 99 mg/dL 81  119  111   BUN 6 - 20 mg/dL '14  11  16   '$ Creatinine 0.44 - 1.00 mg/dL 0.82  0.68  0.82   Sodium 135 - 145 mmol/L 135  133  136   Potassium 3.5 - 5.1  mmol/L 3.0  3.0  3.7   Chloride 98 - 111 mmol/L 97  98  108   CO2 22 - 32 mmol/L '30  26  19   '$ Calcium 8.9 - 10.3 mg/dL 7.6  7.6  7.2   Total Protein 6.5 - 8.1 g/dL 5.1  5.6  5.5   Total Bilirubin 0.3 - 1.2 mg/dL 3.4  3.3  4.0   Alkaline Phos 38 - 126 U/L 49  62  41   AST 15 - 41 U/L '18  20  26   '$ ALT 0 - 44 U/L '14  21  23     '$ Results for orders placed or performed during the hospital encounter of 03/21/22  Culture, blood (x 2)   Specimen: BLOOD  Result Value Ref Range   Specimen Description BLOOD BLOOD LEFT HAND    Special Requests      BOTTLES DRAWN AEROBIC AND ANAEROBIC Blood Culture adequate volume   Culture      NO GROWTH 3 DAYS Performed at [facility], [address] [place], [place] [address]    Report Status PENDING    Culture, blood (x 2)   Specimen: BLOOD  Result Value Ref Range   Specimen Description BLOOD BLOOD RIGHT HAND    Special Requests AEROBIC BOTTLE ONLY Blood Culture adequate volume    Culture      NO GROWTH 3 DAYS Performed at [facility], [address] [place], [place] [address]    Report Status PENDING   Aerobic/Anaerobic Culture w Gram Stain (surgical/deep wound)   Specimen: Wound; Abscess  Result Value Ref Range   Specimen Description      WOUND Performed at [facility], [address] [place], [place] [address]    Special Requests      ABD ABSCESS Performed at [facility], [address] [place], [place] [address]    Gram Stain      ABUNDANT WBC PRESENT,BOTH PMN AND MONONUCLEAR ABUNDANT GRAM NEGATIVE RODS FEW GRAM POSITIVE RODS FEW GRAM POSITIVE COCCI IN PAIRS FEW BUDDING YEAST SEEN    Culture      ABUNDANT ESCHERICHIA COLI MODERATE CLOSTRIDIUM PERFRINGENS Standardized susceptibility testing for this organism is not available. ABUNDANT BACTEROIDES VULGATUS BETA LACTAMASE NEGATIVE Performed at [facility], [place] [address] [place], [place] [address]    Report Status 03/25/2022 FINAL    Organism ID, Bacteria ESCHERICHIA COLI       Susceptibility   Escherichia coli - MIC*    AMPICILLIN 8 SENSITIVE Sensitive     CEFAZOLIN <=4 SENSITIVE Sensitive     CEFEPIME <=0.12 SENSITIVE Sensitive     CEFTAZIDIME <=1 SENSITIVE Sensitive     CEFTRIAXONE <=0.25 SENSITIVE Sensitive     CIPROFLOXACIN <=0.25 SENSITIVE Sensitive     GENTAMICIN <=1 SENSITIVE Sensitive     IMIPENEM <=0.25 SENSITIVE Sensitive     TRIMETH/SULFA <=20 SENSITIVE Sensitive     AMPICILLIN/SULBACTAM <=2 SENSITIVE Sensitive     PIP/TAZO <=4 SENSITIVE Sensitive     * ABUNDANT ESCHERICHIA COLI  MRSA Next Gen by PCR, Nasal   Specimen: Nasal Mucosa; Nasal Swab  Result Value Ref Range   MRSA by PCR Next Gen NOT DETECTED NOT DETECTED     Imaging:  DG Abd 1  View CLINICAL DATA:  NG tube placement  EXAM: ABDOMEN - 1 VIEW  COMPARISON:  Chest radiograph 03/22/2022  FINDINGS: Enteric tube tip is coiled in the left upper quadrant consistent with location in the  body of the stomach. Shallow inspiration with atelectasis in the lung bases. Gas-filled distended upper abdominal small bowel may indicate obstruction.  IMPRESSION: Enteric tube tip projects over the body of the stomach. Atelectasis in the lung bases. Gaseous distention of small bowel.  Electronically Signed   By: Lucienne Capers M.D.   On: 03/24/2022 01:33   CT CHEST, ABDOMEN AND PELVIS WITHOUT CONTRAST (03/23/2022) CLINICAL DATA:  History of robotic salpingectomy on 03/18/2022. Patient is now status post CT-guided placement of a drainage catheter into abdominopelvic abscess on 03/22/2022. Patient is having severe upper abdominal pain.   EXAM: CT CHEST, ABDOMEN AND PELVIS WITHOUT CONTRAST   TECHNIQUE: Multidetector CT imaging of the chest, abdomen and pelvis was performed following the standard protocol without IV contrast.   RADIATION DOSE REDUCTION: This exam was performed according to the departmental dose-optimization program which includes automated exposure control, adjustment of the mA and/or kV according to patient size and/or use of iterative reconstruction technique.   COMPARISON:  None Available.   CT abdomen pelvis 03/21/2022.   FINDINGS: CT CHEST FINDINGS   Cardiovascular: No significant vascular findings. Normal heart size. No pericardial effusion.   Mediastinum/Nodes: No enlarged mediastinal or axillary lymph nodes. Hilar lymph nodes are not well visualized on the unenhanced exam. Thyroid gland, trachea, and esophagus demonstrate no significant findings.   Lungs/Pleura: Segmental atelectasis in the medial lower lobes. Additional non segmental atelectasis in the right upper lobe. No pleural effusion or pneumothorax.   Musculoskeletal: No chest  wall mass or suspicious bone lesions identified.   CT ABDOMEN PELVIS FINDINGS   Hepatobiliary: Diffuse decreased attenuation of the liver. No focal liver abnormality is seen. Increased bile density within the gallbladder, likely reflecting vicarious contrast excretion. No gallstones, gallbladder wall thickening, or biliary dilatation.   Pancreas: Unremarkable. No pancreatic ductal dilatation or surrounding inflammatory changes.   Spleen: Normal in size without focal abnormality.   Adrenals/Urinary Tract: Adrenal glands are unremarkable. Kidneys are normal, without renal calculi, focal lesion, or hydronephrosis. The urinary bladder is markedly distended but otherwise unremarkable.   Stomach/Bowel: The stomach is moderately distended. Multiple dilated proximal and mid small bowel loops measuring up to 3.9 cm in diameter with air-fluid levels (series 7, image 31) the distal ileum is decompressed. There is a gradual tapering of the small bowel caliber without distinct transition point. Submucosal fat deposition throughout the ascending colon, suggestive of sequela of chronic inflammation. The colon is decompressed with mild bowel wall thickening of the sigmoid colon adjacent to the abdominopelvic abscess.   Vascular/Lymphatic: No significant vascular findings are present. No enlarged abdominal or pelvic lymph nodes.   Reproductive: Uterus and right adnexa are unremarkable.   Other: Interval placement of a pigtail drainage catheter within the loculated gas containing fluid collection centered at the left rectouterine pouch. The fluid collection is mildly decreased in size compared to 03/21/2022, measuring 12.8 cm craniocaudal dimension x 4.8 cm AP dimension on sagittal plane compared to 13 cm x 6.4 cm on previous study when measured in a similar fashion (series 6, image 78).   Since 03/21/2022, there is increased abdominopelvic ascites, particularly within the right mid and  lower abdomen. A fluid collection in the right lower contains a few small foci of air but does not have a thin rim of enhancement to suggest a loculated fluid collection (series 7, image 60). Small amount of non dependent free air is again noted within the upper abdomen, consistent with recent postoperative status.   Musculoskeletal: No acute  or significant osseous findings.   IMPRESSION: CT chest:   1. Segmental atelectasis in the lower lobes and additional subsegmental atelectasis in the right upper lobe. No pleural effusion.   CT abdomen pelvis: Interval   1. Mild interval decreased size of the abdominopelvic abscess centered in the left rectouterine pouch status post pigtail drainage catheter placement. 2. New abdominopelvic ascites, particularly within the right abdomen, without discrete loculated drainable fluid collection. 3. New dilatation of multiple small bowel loops with air-fluid levels and moderate distention of the gas and fluid-filled stomach. No distinct transition point. Findings are favored to represent adynamic ileus secondary to inflammatory changes in the left pelvis. 4. Diffuse mild bowel wall thickening of the sigmoid colon secondary to adjacent inflammatory changes related to abdominopelvic abscess. 5. Small volume pneumoperitoneum, consistent with recent postoperative status. 6. Hepatic steatosis.     Electronically Signed   By: Ileana Roup M.D.   On: 03/23/2022 15:47   Assessment/Plan: Severe Sepsis - Managing per ICU Intensivist Continue IV antibiotic course (Zosyn, Diflucan, Clindamycin added last night).  WBCs plateuaed after slight rise yesterday.  Continue to monitor trend.  Patient has remained afebrile throughout admission Patient remains tachycardic, however also continues to complain of intense pain.  S/p CT guided drainage of suspected pelvic abscess 4 days ago, small amount of brownish thin fluid draining. Does not appear purulent.  Cultures of fluid with gram positive rods, cocci, and some yeast.  Blood cultures.  Acute hypoxic respiratory distress Appears to be resolving. Currently saturating > 96% on 2L [place]. Patient's breathing has become deeper (previously splinting due to pain), may likely be able to wean down as tolerated.  Acute kidney injury Resolved.  Concern for ileus vs SBO Currently with NPO, drained ~ 1400 ml so far. Plans are for repeat CT scan today to assess for improvement. Patient has previously been counseled on continued expectant management vs surgical intervention if SBO identified.    A total of 42 minutes were spent during this encounter, including review of previous progress notes, recent imaging and labs, face-to-face with time with patient involving counseling and coordination of care, as well as documentation for current visit.   Rubie Maid, MD [facility]

## 2022-03-25 NOTE — Assessment & Plan Note (Addendum)
   Budding yeast EColi and Clostridium --> IV abx: Unasyn and Diflucan  Abscess shows some improvement on CT today 07/30 --> repeat imaging prn worsening   Monitor CBC and VS for sepsis parameters  IV fluids while npo (NG in place for ileus)

## 2022-03-25 NOTE — Progress Notes (Signed)
Referring Physician(s): Rubie Maid, MD  Supervising Physician: Juliet Rude  Patient Status:  [facility] - In-pt  Reason for visit: Abdominal pain s/p recent robotic assisted salpingectomy, LOA 7/24  S/p successful 18F lower abdominopelvic percutaneous abscess drain placed 7/28  Subjective: Patient with complaints of severe abdominal pain currently. Plan for repeat CT today.  Allergies: Tylenol [acetaminophen]  Medications: Prior to Admission medications   Medication Sig Start Date End Date Taking? Authorizing Provider  ibuprofen (ADVIL) 800 MG tablet Take 1 tablet (800 mg total) by mouth every 8 (eight) hours as needed. 03/18/22  Yes Rubie Maid, MD  oxyCODONE (ROXICODONE) 5 MG immediate release tablet Take 1 tablet (5 mg total) by mouth every 8 (eight) hours as needed. 03/18/22 03/18/23 Yes Rubie Maid, MD  simethicone (GAS-X) 80 MG chewable tablet Chew 1 tablet (80 mg total) by mouth 4 (four) times daily as needed for flatulence. 03/18/22 03/18/23 Yes Rubie Maid, MD  Vitamin D, Ergocalciferol, (DRISDOL) 1.25 MG (50000 UNIT) CAPS capsule Take 50,000 Units by mouth every 7 (seven) days.   Yes [provider]  albuterol (VENTOLIN HFA) 108 (90 Base) MCG/ACT inhaler Inhale 1-2 puffs into the lungs every 6 (six) hours as needed for wheezing or shortness of breath. 09/25/21   Hazel Sams, PA-C   Vital Signs: BP 96/68   Pulse 86   Temp 99.2 F (37.3 C) (Oral)   Resp 20   Ht '5\' 3"'$  (1.6 m)   Wt 200 lb 9.9 oz (91 kg)   LMP 02/25/2022 (Approximate)   SpO2 97%   BMI 35.54 kg/m   Physical Exam  General: Awake, appears in pain, NGT in place Abd: TTP, Abdominopelvic drain dressing C/D, thin non-feculent non-purulent brown colored output 5 cc in JP bulb  Output by Drain (mL) 03/23/22 0701 - 03/23/22 1900 03/23/22 1901 - 03/24/22 0700 03/24/22 0701 - 03/24/22 1900 03/24/22 1901 - 03/25/22 0700 03/25/22 0701 - 03/25/22 1155  Closed System Drain Left Abdomen Bulb (JP)  12 Fr. 20 220 30 15    Imaging: DG Abd 1 View  Result Date: 03/24/2022 CLINICAL DATA:  NG tube placement EXAM: ABDOMEN - 1 VIEW COMPARISON:  Chest radiograph 03/22/2022 FINDINGS: Enteric tube tip is coiled in the left upper quadrant consistent with location in the body of the stomach. Shallow inspiration with atelectasis in the lung bases. Gas-filled distended upper abdominal small bowel may indicate obstruction. IMPRESSION: Enteric tube tip projects over the body of the stomach. Atelectasis in the lung bases. Gaseous distention of small bowel. Electronically Signed   By: Lucienne Capers M.D.   On: 03/24/2022 01:33   CT ABDOMEN PELVIS W CONTRAST  Result Date: 03/23/2022 CLINICAL DATA:  History of robotic salpingectomy on 03/18/2022. Patient is now status post CT-guided placement of a drainage catheter into abdominopelvic abscess on 03/22/2022. Patient is having severe upper abdominal pain. EXAM: CT CHEST, ABDOMEN AND PELVIS WITHOUT CONTRAST TECHNIQUE: Multidetector CT imaging of the chest, abdomen and pelvis was performed following the standard protocol without IV contrast. RADIATION DOSE REDUCTION: This exam was performed according to the departmental dose-optimization program which includes automated exposure control, adjustment of the mA and/or kV according to patient size and/or use of iterative reconstruction technique. COMPARISON:  None Available. CT abdomen pelvis 03/21/2022. FINDINGS: CT CHEST FINDINGS Cardiovascular: No significant vascular findings. Normal heart size. No pericardial effusion. Mediastinum/Nodes: No enlarged mediastinal or axillary lymph nodes. Hilar lymph nodes are not well visualized on the unenhanced exam. Thyroid gland, trachea, and  esophagus demonstrate no significant findings. Lungs/Pleura: Segmental atelectasis in the medial lower lobes. Additional non segmental atelectasis in the right upper lobe. No pleural effusion or pneumothorax. Musculoskeletal: No chest wall mass or  suspicious bone lesions identified. CT ABDOMEN PELVIS FINDINGS Hepatobiliary: Diffuse decreased attenuation of the liver. No focal liver abnormality is seen. Increased bile density within the gallbladder, likely reflecting vicarious contrast excretion. No gallstones, gallbladder wall thickening, or biliary dilatation. Pancreas: Unremarkable. No pancreatic ductal dilatation or surrounding inflammatory changes. Spleen: Normal in size without focal abnormality. Adrenals/Urinary Tract: Adrenal glands are unremarkable. Kidneys are normal, without renal calculi, focal lesion, or hydronephrosis. The urinary bladder is markedly distended but otherwise unremarkable. Stomach/Bowel: The stomach is moderately distended. Multiple dilated proximal and mid small bowel loops measuring up to 3.9 cm in diameter with air-fluid levels (series 7, image 31) the distal ileum is decompressed. There is a gradual tapering of the small bowel caliber without distinct transition point. Submucosal fat deposition throughout the ascending colon, suggestive of sequela of chronic inflammation. The colon is decompressed with mild bowel wall thickening of the sigmoid colon adjacent to the abdominopelvic abscess. Vascular/Lymphatic: No significant vascular findings are present. No enlarged abdominal or pelvic lymph nodes. Reproductive: Uterus and right adnexa are unremarkable. Other: Interval placement of a pigtail drainage catheter within the loculated gas containing fluid collection centered at the left rectouterine pouch. The fluid collection is mildly decreased in size compared to 03/21/2022, measuring 12.8 cm craniocaudal dimension x 4.8 cm AP dimension on sagittal plane compared to 13 cm x 6.4 cm on previous study when measured in a similar fashion (series 6, image 78). Since 03/21/2022, there is increased abdominopelvic ascites, particularly within the right mid and lower abdomen. A fluid collection in the right lower contains a few small foci  of air but does not have a thin rim of enhancement to suggest a loculated fluid collection (series 7, image 60). Small amount of non dependent free air is again noted within the upper abdomen, consistent with recent postoperative status. Musculoskeletal: No acute or significant osseous findings. IMPRESSION: CT chest: 1. Segmental atelectasis in the lower lobes and additional subsegmental atelectasis in the right upper lobe. No pleural effusion. CT abdomen pelvis: Interval 1. Mild interval decreased size of the abdominopelvic abscess centered in the left rectouterine pouch status post pigtail drainage catheter placement. 2. New abdominopelvic ascites, particularly within the right abdomen, without discrete loculated drainable fluid collection. 3. New dilatation of multiple small bowel loops with air-fluid levels and moderate distention of the gas and fluid-filled stomach. No distinct transition point. Findings are favored to represent adynamic ileus secondary to inflammatory changes in the left pelvis. 4. Diffuse mild bowel wall thickening of the sigmoid colon secondary to adjacent inflammatory changes related to abdominopelvic abscess. 5. Small volume pneumoperitoneum, consistent with recent postoperative status. 6. Hepatic steatosis. Electronically Signed   By: Ileana Roup M.D.   On: 03/23/2022 15:47   CT CHEST WO CONTRAST  Result Date: 03/23/2022 CLINICAL DATA:  History of robotic salpingectomy on 03/18/2022. Patient is now status post CT-guided placement of a drainage catheter into abdominopelvic abscess on 03/22/2022. Patient is having severe upper abdominal pain. EXAM: CT CHEST, ABDOMEN AND PELVIS WITHOUT CONTRAST TECHNIQUE: Multidetector CT imaging of the chest, abdomen and pelvis was performed following the standard protocol without IV contrast. RADIATION DOSE REDUCTION: This exam was performed according to the departmental dose-optimization program which includes automated exposure control, adjustment  of the mA and/or kV according to patient size and/or  use of iterative reconstruction technique. COMPARISON:  None Available. CT abdomen pelvis 03/21/2022. FINDINGS: CT CHEST FINDINGS Cardiovascular: No significant vascular findings. Normal heart size. No pericardial effusion. Mediastinum/Nodes: No enlarged mediastinal or axillary lymph nodes. Hilar lymph nodes are not well visualized on the unenhanced exam. Thyroid gland, trachea, and esophagus demonstrate no significant findings. Lungs/Pleura: Segmental atelectasis in the medial lower lobes. Additional non segmental atelectasis in the right upper lobe. No pleural effusion or pneumothorax. Musculoskeletal: No chest wall mass or suspicious bone lesions identified. CT ABDOMEN PELVIS FINDINGS Hepatobiliary: Diffuse decreased attenuation of the liver. No focal liver abnormality is seen. Increased bile density within the gallbladder, likely reflecting vicarious contrast excretion. No gallstones, gallbladder wall thickening, or biliary dilatation. Pancreas: Unremarkable. No pancreatic ductal dilatation or surrounding inflammatory changes. Spleen: Normal in size without focal abnormality. Adrenals/Urinary Tract: Adrenal glands are unremarkable. Kidneys are normal, without renal calculi, focal lesion, or hydronephrosis. The urinary bladder is markedly distended but otherwise unremarkable. Stomach/Bowel: The stomach is moderately distended. Multiple dilated proximal and mid small bowel loops measuring up to 3.9 cm in diameter with air-fluid levels (series 7, image 31) the distal ileum is decompressed. There is a gradual tapering of the small bowel caliber without distinct transition point. Submucosal fat deposition throughout the ascending colon, suggestive of sequela of chronic inflammation. The colon is decompressed with mild bowel wall thickening of the sigmoid colon adjacent to the abdominopelvic abscess. Vascular/Lymphatic: No significant vascular findings are present.  No enlarged abdominal or pelvic lymph nodes. Reproductive: Uterus and right adnexa are unremarkable. Other: Interval placement of a pigtail drainage catheter within the loculated gas containing fluid collection centered at the left rectouterine pouch. The fluid collection is mildly decreased in size compared to 03/21/2022, measuring 12.8 cm craniocaudal dimension x 4.8 cm AP dimension on sagittal plane compared to 13 cm x 6.4 cm on previous study when measured in a similar fashion (series 6, image 78). Since 03/21/2022, there is increased abdominopelvic ascites, particularly within the right mid and lower abdomen. A fluid collection in the right lower contains a few small foci of air but does not have a thin rim of enhancement to suggest a loculated fluid collection (series 7, image 60). Small amount of non dependent free air is again noted within the upper abdomen, consistent with recent postoperative status. Musculoskeletal: No acute or significant osseous findings. IMPRESSION: CT chest: 1. Segmental atelectasis in the lower lobes and additional subsegmental atelectasis in the right upper lobe. No pleural effusion. CT abdomen pelvis: Interval 1. Mild interval decreased size of the abdominopelvic abscess centered in the left rectouterine pouch status post pigtail drainage catheter placement. 2. New abdominopelvic ascites, particularly within the right abdomen, without discrete loculated drainable fluid collection. 3. New dilatation of multiple small bowel loops with air-fluid levels and moderate distention of the gas and fluid-filled stomach. No distinct transition point. Findings are favored to represent adynamic ileus secondary to inflammatory changes in the left pelvis. 4. Diffuse mild bowel wall thickening of the sigmoid colon secondary to adjacent inflammatory changes related to abdominopelvic abscess. 5. Small volume pneumoperitoneum, consistent with recent postoperative status. 6. Hepatic steatosis.  Electronically Signed   By: Ileana Roup M.D.   On: 03/23/2022 15:47   DG Chest 2 View  Result Date: 03/22/2022 CLINICAL DATA:  Shortness of breath. EXAM: CHEST - 2 VIEW COMPARISON:  Two-view chest x-ray 07/26/2016.  CT chest 03/21/2022 FINDINGS: Low lung volumes exaggerate the heart size. Left lower lobe airspace opacities are again noted. No  other significant airspace disease is present. IMPRESSION: Persistent left lower lobe airspace disease concerning for pneumonia. Electronically Signed   By: San Morelle M.D.   On: 03/22/2022 21:46   CT IMAGE GUIDED DRAINAGE PERCUT CATH  PERITONEAL RETROPERIT  Result Date: 03/22/2022 INDICATION: Abdominopelvic abscess EXAM: CT-guided placement of drainage catheter into abdominopelvic abscess TECHNIQUE: Multidetector CT imaging of the abdomen and pelvis was performed following the standard protocol without IV contrast. RADIATION DOSE REDUCTION: This exam was performed according to the departmental dose-optimization program which includes automated exposure control, adjustment of the mA and/or kV according to patient size and/or use of iterative reconstruction technique. MEDICATIONS: Per EMR ANESTHESIA/SEDATION: Local analgesia COMPLICATIONS: None immediate. PROCEDURE: Informed written consent was obtained from the patient after a thorough discussion of the procedural risks, benefits and alternatives. All questions were addressed. Maximal Sterile Barrier Technique was utilized including caps, mask, sterile gowns, sterile gloves, sterile drape, hand hygiene and skin antiseptic. A timeout was performed prior to the initiation of the procedure. The patient was placed supine on the exam table. Limited CT of the abdomen and pelvis was performed for planning purposes. This again demonstrated an air in fluid collection in the lower abdomen and pelvis. Skin entry site was marked with a planned anterolateral approach from the left lower quadrant. The overlying skin was  prepped and draped in a standard sterile fashion. Local analgesia was obtained with 1% lidocaine. Using intermittent CT fluoroscopy, an 18 gauge trocar needle was advanced towards the identified abdominopelvic fluid collection. Access was confirmed with CT and return of purulent material. An 035 wire was then advanced through the access needle, over which the percutaneous tract was serially dilated to accommodate a 12 [place] multipurpose locking drainage catheter. Location was again confirmed with CT and return of additional purulent material. Catheter was secured to the skin using silk suture and a dressing. It was attached to bulb suction. The patient tolerated the procedure well without immediate complication. IMPRESSION: Successful CT-guided placement of a 12 French locking drainage catheter into the lower abdominopelvic abscess. Sample sent to the lab for microbiology analysis. Electronically Signed   By: Albin Felling M.D.   On: 03/22/2022 15:12   CT ABDOMEN PELVIS W CONTRAST  Result Date: 03/21/2022 CLINICAL DATA:  Abdominal pain, post-op Abdominal pain, acute, nonlocalized. Robotic salpingectomy 03/18/2022 EXAM: CT ABDOMEN AND PELVIS WITH CONTRAST TECHNIQUE: Multidetector CT imaging of the abdomen and pelvis was performed using the standard protocol following bolus administration of intravenous contrast. RADIATION DOSE REDUCTION: This exam was performed according to the departmental dose-optimization program which includes automated exposure control, adjustment of the mA and/or kV according to patient size and/or use of iterative reconstruction technique. CONTRAST:  139m OMNIPAQUE IOHEXOL 300 MG/ML  SOLN COMPARISON:  None Available. FINDINGS: Lower chest: Left base atelectasis.  No acute abnormality. Hepatobiliary: Liver is enlarged measuring up to 19 cm. The hepatic parenchyma is diffusely hypodense compared to the splenic parenchyma consistent with fatty infiltration. No focal liver abnormality. No  gallstones, gallbladder wall thickening, or pericholecystic fluid. No biliary dilatation. Pancreas: No focal lesion. Normal pancreatic contour. No surrounding inflammatory changes. No main pancreatic ductal dilatation. Spleen: Normal in size without focal abnormality. Adrenals/Urinary Tract: No adrenal nodule bilaterally. Bilateral kidneys enhance symmetrically. No hydronephrosis. No hydroureter. The urinary bladder is unremarkable. On delayed imaging, there is no urothelial wall thickening and there are no filling defects in the opacified portions of the bilateral collecting systems or ureters as well as urinary bladder. Stomach/Bowel: Stomach is within  normal limits. No evidence of bowel wall thickening or dilatation. Appendix appears normal. Vascular/Lymphatic: No abdominal aorta or iliac aneurysm. No abdominal, pelvic, or inguinal lymphadenopathy. Reproductive: Uterus is unremarkable no adnexal mass identified. Other: No intraperitoneal free fluid. Small volume free intraperitoneal gas. There is a gas and fluid collection with peripheral enhancement measuring approximately 8 x 9 x 7.5 cm centered within the left rectouterine pouch. Musculoskeletal: No abdominal wall hernia or abnormality. No suspicious lytic or blastic osseous lesions. No acute displaced fracture. IMPRESSION: 1. An 8 x 9 x 7.5cm gas and fluid collection centered within the left rectouterine pouch likely represents a developing abscess. No findings to suggest bowel injury; however, this is not fully excluded. If a CT is repeated in the near future, consider use of IV and PO contrast. 2. Small volume pneumoperitoneum likely postsurgical in etiology. 3. Hepatomegaly and hepatic steatosis. Electronically Signed   By: Iven Finn M.D.   On: 03/21/2022 18:43    Labs:  CBC: Recent Labs    03/22/22 1048 03/23/22 0530 03/24/22 0447 03/25/22 0353  WBC 11.5* 9.0 13.3* 13.0*  HGB 13.8 13.6 12.3 10.8*  HCT 40.2 39.9 36.4 32.3*  PLT 260 199  213 189    COAGS: Recent Labs    03/22/22 1048  INR 1.3*  APTT 34    BMP: Recent Labs    03/22/22 2242 03/23/22 0431 03/24/22 0447 03/25/22 0353  NA 136 136 133* 135  K 4.1 3.7 3.0* 3.0*  CL 108 108 98 97*  CO2 19* 19* 26 30  GLUCOSE 109* 111* 119* 81  BUN '19 16 11 14  '$ CALCIUM 7.5* 7.2* 7.6* 7.6*  CREATININE 0.89 0.82 0.68 0.82  GFRNONAA >60 >60 >60 >60    LIVER FUNCTION TESTS: Recent Labs    03/22/22 0924 03/23/22 0431 03/24/22 0447 03/25/22 0353  BILITOT 4.9* 4.0* 3.3* 3.4*  AST '27 26 20 18  '$ ALT 34 '23 21 14  '$ ALKPHOS 35* 41 62 49  PROT 6.2* 5.5* 5.6* 5.1*  ALBUMIN 3.3* 2.6* 2.4* 2.1*    Assessment and Plan: This is a 36 year old female known to our service with significant PMHx of recent robotic assisted left salpingectomy, LOA and drainage of left peritoneal cyst 7/24 s/p successful 46F lower abdominopelvic percutaneous abscess drain placed 7/28. Cx + E.Coli and + Clostridium perfringens, wbc trend is still elevated 13k, good output from drain however patient with significant abdominal pain s/p CT done 7/29 day after drain placed. I have reviewed the CT from 7/29 today with Dr. Denna Haggard and drain catheter in good position with interval decrease in fluid collection, there is still fluid around this collection so would keep drain in place and continue to flush and document output. New fluid collection in RLQ- not yet amendable to drainage, would watch for clinically worsening signs and if collections increases in size may be amendable to drainage. Patient with Ileus currently and NGT in place, plan for another repeat CT today per primary team.    If patient is clinically improving, afebrile and catheter drainage remains <10-15 cc/48 hrs (excluding flush volumes) and is non-feculent and non-purulent, consider catheter removal pending discussion with the providing surgical service and potential drain injection.  If patient has worsening clinical symptoms, persistently  febrile or has increased or consistently high drainage output, it may be necessary to repeat CT imaging to evaluate for residual abscess or fistula formation.   Drainage catheter has retained suture within catheter, prior to removal of drain cut  off the hub to release the retention suture forming the pigtail.  Plan: Continue saline flushes with 5 cc NS. Record output Q shift. Dressing changes QD or PRN if soiled.  Secure chat Epic message IR APP or on call IR MD if difficulty flushing or sudden change in drain output.  Repeat imaging/possible drain injection once output < 10 mL/QD (excluding flush material.)  Discharge planning: Please secure chat epic message IR APP or on call IR MD prior to patient d/c to ensure appropriate follow up plans are in place. Typically patient will follow up with IR clinic 10-14 days post d/c for repeat imaging/possible drain injection. IR scheduler will contact patient with date/time of appointment. Patient will need to flush drain QD with 5 cc NS, record output QD, dressing changes every 2-3 days or earlier if soiled.   IR will continue to follow - please call with questions or concerns.   Electronically Signed: Hedy Jacob, PA-C 03/25/2022, 11:47 AM   I spent a total of 15 Minutes at the the patient's bedside AND on the patient's hospital floor or unit, greater than 50% of which was counseling/coordinating care for abdominal abscess.

## 2022-03-25 NOTE — Progress Notes (Addendum)
PROGRESS NOTE    Lacey Harrison  NLZ:[id number] DOB: [birth year]  DOA: 03/21/2022 Date of Service: 03/25/22 PCP: Rubie Maid, MD     Brief Narrative / Hospital Course:  36 yo F presenting to [facility] ED from home on 03/21/22 with complaints of sudden onset severe abdominal pain. She recently on 03/18/22 had robotic adhesiolysis, left salpingectomy, drainage of 500 mL from large LEFT peritoneal cyst & chromotubation. She was discharged home on 9/02 without complication. She reported being on the floor with her cat and twisting her body around, with sudden acute abdominal pain. Patient complained of severe pain in lower abdomen radiating towards her vagina.  07/27: CT imaging abscess. Leukocytosis with left shift at 16.8. Initiated on IV Zosyn and Doxycycline. Admitted to OBGYN.  07/28: IR procedure 07/28 CT drain placement into pelvic abscess, left anterior approach, 12 Fr to bulb suction. Patient developed severe sepsis/septic shock after drainage of abdominal pelvic abscess.  She required transfer to stepdown unit, dyspnea worsened with desaturations noted, due to concerns for respiratory status PCCM consulted, receiving BS abx w/ zosyn, doxycycline & vancomycin, volume resuscitation w/ IVF, awaiting culture results   07/29: Remains in SDU w/ ileus and sepsis. IV antibiotics: Continue Zosyn, added fluconazole due to budding yeast noted on abscess fluid, Discontinue doxycycline/vancomycin x1, continue IVF hydration - bicarb infusion, DC other IV fluids due to anasarca. Developing ileus, query feculent appearance on abscess drain, repeat CT abdomen pelvis performed, new loculations on the right, anasarca, distended urinary bladder. No contrast extravasation to suggest bowel perforation 07/30: Continues to have abdominal pain, required NG last night due to recurrent vomiting.  Had 1200 mL of bilious material suctioned from stomach.  NG in place, abdomen less distended. IV antibiotics: Continue Zosyn,  fluconazole, added Clindamycin d/t clostridium prefringens noted on culture.  07/31: repeat CT The abscess is slightly decreased compared to prior exam. Continuing on Unasyn and Diflucan.    Consultants:  PCCU OBGYN IR  Procedures: 07/28 CT drain placement into pelvic abscess 07/30 NG tube placed     Subjective: Patient reports pain is controlled, no BM/flatus today, nausea is better. No CP/SOB.      ASSESSMENT & PLAN:   Principal Problem:   Postoperative intra-abdominal abscess Active Problems:   Acute respiratory failure with hypoxia (HCC) in seting of MSKpain, atelectasis, severe sepsis    Severe sepsis (HCC) without septic shock, d/t postoperative abscess, s/p pelvic drain placement    Ileus, postoperative (HCC)   AKI (acute kidney injury) ([place]) in setting of sepsis, IV contrast, NAGMA - RESOLVED    Severe sepsis ([place]) without septic shock, d/t postoperative abscess, s/p pelvic drain placement  Budding yeast EColi and Clostridium --> IV abx: Unasyn and Diflucan Abscess shows some improvement on CT today 07/30 --> repeat imaging prn worsening  Monitor CBC and VS for sepsis parameters IV fluids while npo (NG in place for ileus)   Acute respiratory failure with hypoxia (HCC) in seting of MSKpain, atelectasis, severe sepsis  Supplemental O2 prn goal >90% bronchodilators  Incentive spirometry   AKI (acute kidney injury) ([place]) in setting of sepsis, IV contrast, NAGMA - RESOLVED  Trend BMP I&O close monitor UOP  Ileus, postoperative (HCC) NG in place Diet as tolerated      DVT prophylaxis: heparin Code Status: FULL Family Communication: family at bedside on rounds  Disposition Plan / TOC needs: none at this time, PT once more stable  Barriers to discharge / significant pending items: pending improvement in ileus, pain  management, may need repeat CT              Objective: Vitals:   03/25/22 0900 03/25/22 1000 03/25/22 1100 03/25/22 1500  BP:  105/75 102/77 102/79 99/71  Pulse: 91 96 97 99  Resp:      Temp:    98.6 F (37 C)  TempSrc:    Oral  SpO2: 96% 98% 99% 96%  Weight:      Height:        Intake/Output Summary (Last 24 hours) at 03/25/2022 1637 Last data filed at 03/25/2022 1300 Gross per 24 hour  Intake 0 ml  Output 815 ml  Net -815 ml   Filed Weights   03/21/22 1722  Weight: 91 kg    Examination:  Constitutional:  VS as above General Appearance: alert, well-developed, well-nourished, NAD Eyes: Normal lids and conjunctive, non-icteric sclera Ears, Nose, Mouth, Throat: NG in place  Neck: No masses, trachea midline Respiratory: Normal respiratory effort Breath sounds normal, no wheeze/rhonchi/rales Cardiovascular: S1/S2 normal, no murmur/rub/gallop auscultated No lower extremity edema Gastrointestinal: Mild TTP diffusely no rebound/guarding  Musculoskeletal:  No clubbing/cyanosis of digits Neurological: No cranial nerve deficit on limited exam Psychiatric: Normal judgment/insight Normal mood and affect       Scheduled Medications:   Chlorhexidine Gluconate Cloth  6 each Topical Daily   docusate sodium  100 mg Oral BID   heparin injection (subcutaneous)  5,000 Units Subcutaneous Q8H   pantoprazole (PROTONIX) IV  40 mg Intravenous QHS   sodium chloride flush  5 mL Intracatheter Q8H    Continuous Infusions:  sodium chloride Stopped (03/22/22 1328)   ampicillin-sulbactam (UNASYN) IV     fluconazole (DIFLUCAN) IV 400 mg (03/24/22 1632)    PRN Medications:  sodium chloride, albuterol, alum & mag hydroxide-simeth, bisacodyl, diazepam, ketorolac, morphine injection, ondansetron **OR** ondansetron (ZOFRAN) IV, simethicone, zolpidem  Antimicrobials:  Anti-infectives (From admission, onward)    Start     Dose/Rate Route Frequency Ordered Stop   03/25/22 1600  Ampicillin-Sulbactam (UNASYN) 3 g in sodium chloride 0.9 % 100 mL IVPB        3 g 200 mL/hr over 30 Minutes Intravenous Every 6  hours 03/25/22 1134     03/24/22 2200  clindamycin (CLEOCIN) IVPB 900 mg  Status:  Discontinued        900 mg 100 mL/hr over 30 Minutes Intravenous Every 8 hours 03/24/22 1854 03/25/22 1134   03/23/22 1500  fluconazole (DIFLUCAN) IVPB 400 mg        400 mg 100 mL/hr over 120 Minutes Intravenous Every 24 hours 03/23/22 1358     03/22/22 1851  vancomycin variable dose per unstable renal function (pharmacist dosing)  Status:  Discontinued         Does not apply See admin instructions 03/22/22 1851 03/23/22 1051   03/22/22 1645  vancomycin (VANCOCIN) IVPB 1000 mg/200 mL premix  Status:  Discontinued        1,000 mg 200 mL/hr over 60 Minutes Intravenous  Once 03/22/22 1553 03/22/22 1558   03/22/22 1645  vancomycin (VANCOREADY) IVPB 2000 mg/400 mL        2,000 mg 200 mL/hr over 120 Minutes Intravenous  Once 03/22/22 1558 03/22/22 1814   03/22/22 0400  piperacillin-tazobactam (ZOSYN) IVPB 3.375 g  Status:  Discontinued        3.375 g 12.5 mL/hr over 240 Minutes Intravenous Every 8 hours 03/21/22 2047 03/25/22 1134   03/21/22 2200  doxycycline (VIBRAMYCIN) 100 mg in sodium chloride  0.9 % 250 mL IVPB  Status:  Discontinued        100 mg 125 mL/hr over 120 Minutes Intravenous Every 12 hours 03/21/22 2047 03/23/22 1358   03/21/22 1900  piperacillin-tazobactam (ZOSYN) IVPB 3.375 g        3.375 g 12.5 mL/hr over 240 Minutes Intravenous STAT 03/21/22 1853 03/21/22 2300       Data Reviewed: I have personally reviewed following labs and imaging studies  CBC: Recent Labs  Lab 03/21/22 1744 03/22/22 0543 03/22/22 1048 03/23/22 0530 03/24/22 0447 03/25/22 0353  WBC 16.8* 9.9 11.5* 9.0 13.3* 13.0*  NEUTROABS 14.5* 8.3* 10.0*  --   --   --   HGB 15.3* 14.7 13.8 13.6 12.3 10.8*  HCT 44.5 43.1 40.2 39.9 36.4 32.3*  MCV 84.3 85.2 84.6 85.4 84.3 86.1  PLT 294 290 260 199 213 710   Basic Metabolic Panel: Recent Labs  Lab 03/22/22 0924 03/22/22 2242 03/23/22 0431 03/24/22 0447 03/25/22 0353   NA 136 136 136 133* 135  K 5.3* 4.1 3.7 3.0* 3.0*  CL 103 108 108 98 97*  CO2 21* 19* 19* 26 30  GLUCOSE 138* 109* 111* 119* 81  BUN 26* '19 16 11 14  '$ CREATININE 1.82* 0.89 0.82 0.68 0.82  CALCIUM 8.2* 7.5* 7.2* 7.6* 7.6*  MG  --  1.4* 2.6* 2.6* 2.4  PHOS  --  3.6 3.8 1.7* 2.2*   GFR: Estimated Creatinine Clearance: 102.5 mL/min (by C-G formula based on SCr of 0.82 mg/dL). Liver Function Tests: Recent Labs  Lab 03/21/22 1744 03/22/22 0924 03/23/22 0431 03/24/22 0447 03/25/22 0353  AST '31 27 26 20 18  '$ ALT 49* 34 '23 21 14  '$ ALKPHOS 57 35* 41 62 49  BILITOT 2.0* 4.9* 4.0* 3.3* 3.4*  PROT 7.8 6.2* 5.5* 5.6* 5.1*  ALBUMIN 4.3 3.3* 2.6* 2.4* 2.1*   Recent Labs  Lab 03/21/22 1744  LIPASE 26   No results for input(s): "AMMONIA" in the last 168 hours. Coagulation Profile: Recent Labs  Lab 03/22/22 1048  INR 1.3*   Cardiac Enzymes: No results for input(s): "CKTOTAL", "CKMB", "CKMBINDEX", "TROPONINI" in the last 168 hours. BNP (last 3 results) No results for input(s): "PROBNP" in the last 8760 hours. HbA1C: No results for input(s): "HGBA1C" in the last 72 hours. CBG: Recent Labs  Lab 03/22/22 2223  GLUCAP 95   Lipid Profile: No results for input(s): "CHOL", "HDL", "LDLCALC", "TRIG", "CHOLHDL", "LDLDIRECT" in the last 72 hours. Thyroid Function Tests: No results for input(s): "TSH", "T4TOTAL", "FREET4", "T3FREE", "THYROIDAB" in the last 72 hours. Anemia Panel: No results for input(s): "VITAMINB12", "FOLATE", "FERRITIN", "TIBC", "IRON", "RETICCTPCT" in the last 72 hours. Urine analysis:    Component Value Date/Time   COLORURINE RED (A) 03/24/2022 1802   APPEARANCEUR TURBID (A) 03/24/2022 1802   LABSPEC 1.045 (H) 03/24/2022 1802   PHURINE  03/24/2022 1802    TEST NOT REPORTED DUE TO COLOR INTERFERENCE OF URINE PIGMENT   GLUCOSEU (A) 03/24/2022 1802    TEST NOT REPORTED DUE TO COLOR INTERFERENCE OF URINE PIGMENT   HGBUR NEGATIVE 03/24/2022 1802   BILIRUBINUR (A)  03/24/2022 1802    TEST NOT REPORTED DUE TO COLOR INTERFERENCE OF URINE PIGMENT   KETONESUR (A) 03/24/2022 1802    TEST NOT REPORTED DUE TO COLOR INTERFERENCE OF URINE PIGMENT   PROTEINUR (A) 03/24/2022 1802    TEST NOT REPORTED DUE TO COLOR INTERFERENCE OF URINE PIGMENT   NITRITE (A) 03/24/2022 1802    TEST  NOT REPORTED DUE TO COLOR INTERFERENCE OF URINE PIGMENT   LEUKOCYTESUR (A) 03/24/2022 1802    TEST NOT REPORTED DUE TO COLOR INTERFERENCE OF URINE PIGMENT   Sepsis Labs: '@LABRCNTIP'$ (procalcitonin:4,lacticidven:4)  Recent Results (from the past 240 hour(s))  Culture, blood (x 2)     Status: None (Preliminary result)   Collection Time: 03/22/22 10:48 AM   Specimen: BLOOD  Result Value Ref Range Status   Specimen Description BLOOD BLOOD LEFT HAND  Final   Special Requests   Final    BOTTLES DRAWN AEROBIC AND ANAEROBIC Blood Culture adequate volume   Culture   Final    NO GROWTH 3 DAYS Performed at [facility], [address], [place], [place] [address]    Report Status PENDING  Incomplete  Culture, blood (x 2)     Status: None (Preliminary result)   Collection Time: 03/22/22 11:00 AM   Specimen: BLOOD  Result Value Ref Range Status   Specimen Description BLOOD BLOOD RIGHT HAND  Final   Special Requests AEROBIC BOTTLE ONLY Blood Culture adequate volume  Final   Culture   Final    NO GROWTH 3 DAYS Performed at [facility], [address] [place], [place] [address]    Report Status PENDING  Incomplete  Aerobic/Anaerobic Culture w Gram Stain (surgical/deep wound)     Status: None   Collection Time: 03/22/22 12:57 PM   Specimen: Wound; Abscess  Result Value Ref Range Status   Specimen Description   Final    WOUND Performed at [facility], [address], [place], [place] [address]    Special Requests   Final    ABD ABSCESS Performed at [facility], [address] [place], [place] [address]    Gram Stain   Final    ABUNDANT  WBC PRESENT,BOTH PMN AND MONONUCLEAR ABUNDANT GRAM NEGATIVE RODS FEW GRAM POSITIVE RODS FEW GRAM POSITIVE COCCI IN PAIRS FEW BUDDING YEAST SEEN    Culture   Final    ABUNDANT ESCHERICHIA COLI MODERATE CLOSTRIDIUM PERFRINGENS Standardized susceptibility testing for this organism is not available. ABUNDANT BACTEROIDES VULGATUS BETA LACTAMASE NEGATIVE Performed at [facility], [place] [address], [place], [place] [address]    Report Status 03/25/2022 FINAL  Final   Organism ID, Bacteria ESCHERICHIA COLI  Final      Susceptibility   Escherichia coli - MIC*    AMPICILLIN 8 SENSITIVE Sensitive     CEFAZOLIN <=4 SENSITIVE Sensitive     CEFEPIME <=0.12 SENSITIVE Sensitive     CEFTAZIDIME <=1 SENSITIVE Sensitive     CEFTRIAXONE <=0.25 SENSITIVE Sensitive     CIPROFLOXACIN <=0.25 SENSITIVE Sensitive     GENTAMICIN <=1 SENSITIVE Sensitive     IMIPENEM <=0.25 SENSITIVE Sensitive     TRIMETH/SULFA <=20 SENSITIVE Sensitive     AMPICILLIN/SULBACTAM <=2 SENSITIVE Sensitive     PIP/TAZO <=4 SENSITIVE Sensitive     * ABUNDANT ESCHERICHIA COLI  MRSA Next Gen by PCR, Nasal     Status: None   Collection Time: 03/22/22 11:00 PM   Specimen: Nasal Mucosa; Nasal Swab  Result Value Ref Range Status   MRSA by PCR Next Gen NOT DETECTED NOT DETECTED Final    Comment: (NOTE) The GeneXpert MRSA Assay (FDA approved for NASAL specimens only), is one component of a comprehensive MRSA colonization surveillance program. It is not intended to diagnose MRSA infection nor to guide or monitor treatment for MRSA infections. Test performance is not FDA approved in patients less  than 56 years old. Performed at [facility], [address], [place], [place] [address]          Radiology Studies last 96 hours: CT ABDOMEN PELVIS W CONTRAST  Result Date: 03/25/2022 CLINICAL DATA:  Acute abdominal pain status post surgery. EXAM: CT ABDOMEN AND PELVIS WITH CONTRAST TECHNIQUE: Multidetector CT imaging  of the abdomen and pelvis was performed using the standard protocol following bolus administration of intravenous contrast. RADIATION DOSE REDUCTION: This exam was performed according to the departmental dose-optimization program which includes automated exposure control, adjustment of the mA and/or kV according to patient size and/or use of iterative reconstruction technique. CONTRAST:  121m OMNIPAQUE IOHEXOL 300 MG/ML  SOLN COMPARISON:  February 21, 2022. FINDINGS: Lower chest: Mild bilateral posterior basilar subsegmental atelectasis is noted. Hepatobiliary: Hepatic steatosis. No gallstones or biliary dilatation is noted. Pancreas: Unremarkable. No pancreatic ductal dilatation or surrounding inflammatory changes. Spleen: Normal in size without focal abnormality. Adrenals/Urinary Tract: Adrenal glands and kidneys appear normal. No hydronephrosis or renal obstruction is noted. Urinary bladder is decompressed secondary to Foley catheter. Stomach/Bowel: Nasogastric tube tip is seen with tip in proximal stomach. The stomach is otherwise unremarkable. The appendix appears normal. Mild proximal small bowel dilatation is noted which is improved slightly compared to prior exam. Vascular/Lymphatic: No significant vascular findings are present. No enlarged abdominal or pelvic lymph nodes. Reproductive: Status post left salpingectomy. Right adnexal region and uterus are unremarkable. Other: Continued presence of percutaneous drainage catheter seen in left-sided pelvic abscess. The abscess currently measures 8.6 x 3.3 cm which is slightly decreased compared to prior exam. Mild ascites is noted in the pelvis and right side of the abdomen. No hernia is noted. Mild amount of pneumoperitoneum remains consistent with recent postoperative status. Musculoskeletal: No acute or significant osseous findings. IMPRESSION: Continued presence of percutaneous drainage catheter is seen in left-sided abdominal abscess. The abscess currently  measures 8.6 x 3.3 cm which is slightly decreased compared to prior exam. Mild proximal small bowel dilatation is noted which is slightly improved compared to prior exam, most consistent with ileus. Mild amount of pneumoperitoneum is noted consistent with recent postoperative status. Stable mild ascites is noted in the pelvis and right side of the abdomen. Hepatic steatosis. Urinary bladder is decompressed secondary to Foley catheter. Mild bilateral posterior basilar subsegmental atelectasis is noted. Electronically Signed   By: JMarijo ConceptionM.D.   On: 03/25/2022 13:05   DG Abd 1 View  Result Date: 03/24/2022 CLINICAL DATA:  NG tube placement EXAM: ABDOMEN - 1 VIEW COMPARISON:  Chest radiograph 03/22/2022 FINDINGS: Enteric tube tip is coiled in the left upper quadrant consistent with location in the body of the stomach. Shallow inspiration with atelectasis in the lung bases. Gas-filled distended upper abdominal small bowel may indicate obstruction. IMPRESSION: Enteric tube tip projects over the body of the stomach. Atelectasis in the lung bases. Gaseous distention of small bowel. Electronically Signed   By: WLucienne CapersM.D.   On: 03/24/2022 01:33   CT ABDOMEN PELVIS W CONTRAST  Result Date: 03/23/2022 CLINICAL DATA:  History of robotic salpingectomy on 03/18/2022. Patient is now status post CT-guided placement of a drainage catheter into abdominopelvic abscess on 03/22/2022. Patient is having severe upper abdominal pain. EXAM: CT CHEST, ABDOMEN AND PELVIS WITHOUT CONTRAST TECHNIQUE: Multidetector CT imaging of the chest, abdomen and pelvis was performed following the standard protocol without IV contrast. RADIATION DOSE REDUCTION: This exam was performed according to the departmental dose-optimization program which includes automated  exposure control, adjustment of the mA and/or kV according to patient size and/or use of iterative reconstruction technique. COMPARISON:  None Available. CT abdomen  pelvis 03/21/2022. FINDINGS: CT CHEST FINDINGS Cardiovascular: No significant vascular findings. Normal heart size. No pericardial effusion. Mediastinum/Nodes: No enlarged mediastinal or axillary lymph nodes. Hilar lymph nodes are not well visualized on the unenhanced exam. Thyroid gland, trachea, and esophagus demonstrate no significant findings. Lungs/Pleura: Segmental atelectasis in the medial lower lobes. Additional non segmental atelectasis in the right upper lobe. No pleural effusion or pneumothorax. Musculoskeletal: No chest wall mass or suspicious bone lesions identified. CT ABDOMEN PELVIS FINDINGS Hepatobiliary: Diffuse decreased attenuation of the liver. No focal liver abnormality is seen. Increased bile density within the gallbladder, likely reflecting vicarious contrast excretion. No gallstones, gallbladder wall thickening, or biliary dilatation. Pancreas: Unremarkable. No pancreatic ductal dilatation or surrounding inflammatory changes. Spleen: Normal in size without focal abnormality. Adrenals/Urinary Tract: Adrenal glands are unremarkable. Kidneys are normal, without renal calculi, focal lesion, or hydronephrosis. The urinary bladder is markedly distended but otherwise unremarkable. Stomach/Bowel: The stomach is moderately distended. Multiple dilated proximal and mid small bowel loops measuring up to 3.9 cm in diameter with air-fluid levels (series 7, image 31) the distal ileum is decompressed. There is a gradual tapering of the small bowel caliber without distinct transition point. Submucosal fat deposition throughout the ascending colon, suggestive of sequela of chronic inflammation. The colon is decompressed with mild bowel wall thickening of the sigmoid colon adjacent to the abdominopelvic abscess. Vascular/Lymphatic: No significant vascular findings are present. No enlarged abdominal or pelvic lymph nodes. Reproductive: Uterus and right adnexa are unremarkable. Other: Interval placement of a  pigtail drainage catheter within the loculated gas containing fluid collection centered at the left rectouterine pouch. The fluid collection is mildly decreased in size compared to 03/21/2022, measuring 12.8 cm craniocaudal dimension x 4.8 cm AP dimension on sagittal plane compared to 13 cm x 6.4 cm on previous study when measured in a similar fashion (series 6, image 78). Since 03/21/2022, there is increased abdominopelvic ascites, particularly within the right mid and lower abdomen. A fluid collection in the right lower contains a few small foci of air but does not have a thin rim of enhancement to suggest a loculated fluid collection (series 7, image 60). Small amount of non dependent free air is again noted within the upper abdomen, consistent with recent postoperative status. Musculoskeletal: No acute or significant osseous findings. IMPRESSION: CT chest: 1. Segmental atelectasis in the lower lobes and additional subsegmental atelectasis in the right upper lobe. No pleural effusion. CT abdomen pelvis: Interval 1. Mild interval decreased size of the abdominopelvic abscess centered in the left rectouterine pouch status post pigtail drainage catheter placement. 2. New abdominopelvic ascites, particularly within the right abdomen, without discrete loculated drainable fluid collection. 3. New dilatation of multiple small bowel loops with air-fluid levels and moderate distention of the gas and fluid-filled stomach. No distinct transition point. Findings are favored to represent adynamic ileus secondary to inflammatory changes in the left pelvis. 4. Diffuse mild bowel wall thickening of the sigmoid colon secondary to adjacent inflammatory changes related to abdominopelvic abscess. 5. Small volume pneumoperitoneum, consistent with recent postoperative status. 6. Hepatic steatosis. Electronically Signed   By: Ileana Roup M.D.   On: 03/23/2022 15:47   CT CHEST WO CONTRAST  Result Date: 03/23/2022 CLINICAL DATA:   History of robotic salpingectomy on 03/18/2022. Patient is now status post CT-guided placement of a drainage catheter into abdominopelvic abscess on  03/22/2022. Patient is having severe upper abdominal pain. EXAM: CT CHEST, ABDOMEN AND PELVIS WITHOUT CONTRAST TECHNIQUE: Multidetector CT imaging of the chest, abdomen and pelvis was performed following the standard protocol without IV contrast. RADIATION DOSE REDUCTION: This exam was performed according to the departmental dose-optimization program which includes automated exposure control, adjustment of the mA and/or kV according to patient size and/or use of iterative reconstruction technique. COMPARISON:  None Available. CT abdomen pelvis 03/21/2022. FINDINGS: CT CHEST FINDINGS Cardiovascular: No significant vascular findings. Normal heart size. No pericardial effusion. Mediastinum/Nodes: No enlarged mediastinal or axillary lymph nodes. Hilar lymph nodes are not well visualized on the unenhanced exam. Thyroid gland, trachea, and esophagus demonstrate no significant findings. Lungs/Pleura: Segmental atelectasis in the medial lower lobes. Additional non segmental atelectasis in the right upper lobe. No pleural effusion or pneumothorax. Musculoskeletal: No chest wall mass or suspicious bone lesions identified. CT ABDOMEN PELVIS FINDINGS Hepatobiliary: Diffuse decreased attenuation of the liver. No focal liver abnormality is seen. Increased bile density within the gallbladder, likely reflecting vicarious contrast excretion. No gallstones, gallbladder wall thickening, or biliary dilatation. Pancreas: Unremarkable. No pancreatic ductal dilatation or surrounding inflammatory changes. Spleen: Normal in size without focal abnormality. Adrenals/Urinary Tract: Adrenal glands are unremarkable. Kidneys are normal, without renal calculi, focal lesion, or hydronephrosis. The urinary bladder is markedly distended but otherwise unremarkable. Stomach/Bowel: The stomach is moderately  distended. Multiple dilated proximal and mid small bowel loops measuring up to 3.9 cm in diameter with air-fluid levels (series 7, image 31) the distal ileum is decompressed. There is a gradual tapering of the small bowel caliber without distinct transition point. Submucosal fat deposition throughout the ascending colon, suggestive of sequela of chronic inflammation. The colon is decompressed with mild bowel wall thickening of the sigmoid colon adjacent to the abdominopelvic abscess. Vascular/Lymphatic: No significant vascular findings are present. No enlarged abdominal or pelvic lymph nodes. Reproductive: Uterus and right adnexa are unremarkable. Other: Interval placement of a pigtail drainage catheter within the loculated gas containing fluid collection centered at the left rectouterine pouch. The fluid collection is mildly decreased in size compared to 03/21/2022, measuring 12.8 cm craniocaudal dimension x 4.8 cm AP dimension on sagittal plane compared to 13 cm x 6.4 cm on previous study when measured in a similar fashion (series 6, image 78). Since 03/21/2022, there is increased abdominopelvic ascites, particularly within the right mid and lower abdomen. A fluid collection in the right lower contains a few small foci of air but does not have a thin rim of enhancement to suggest a loculated fluid collection (series 7, image 60). Small amount of non dependent free air is again noted within the upper abdomen, consistent with recent postoperative status. Musculoskeletal: No acute or significant osseous findings. IMPRESSION: CT chest: 1. Segmental atelectasis in the lower lobes and additional subsegmental atelectasis in the right upper lobe. No pleural effusion. CT abdomen pelvis: Interval 1. Mild interval decreased size of the abdominopelvic abscess centered in the left rectouterine pouch status post pigtail drainage catheter placement. 2. New abdominopelvic ascites, particularly within the right abdomen, without  discrete loculated drainable fluid collection. 3. New dilatation of multiple small bowel loops with air-fluid levels and moderate distention of the gas and fluid-filled stomach. No distinct transition point. Findings are favored to represent adynamic ileus secondary to inflammatory changes in the left pelvis. 4. Diffuse mild bowel wall thickening of the sigmoid colon secondary to adjacent inflammatory changes related to abdominopelvic abscess. 5. Small volume pneumoperitoneum, consistent with recent postoperative status. 6.  Hepatic steatosis. Electronically Signed   By: Ileana Roup M.D.   On: 03/23/2022 15:47   DG Chest 2 View  Result Date: 03/22/2022 CLINICAL DATA:  Shortness of breath. EXAM: CHEST - 2 VIEW COMPARISON:  Two-view chest x-ray 07/26/2016.  CT chest 03/21/2022 FINDINGS: Low lung volumes exaggerate the heart size. Left lower lobe airspace opacities are again noted. No other significant airspace disease is present. IMPRESSION: Persistent left lower lobe airspace disease concerning for pneumonia. Electronically Signed   By: San Morelle M.D.   On: 03/22/2022 21:46   CT IMAGE GUIDED DRAINAGE PERCUT CATH  PERITONEAL RETROPERIT  Result Date: 03/22/2022 INDICATION: Abdominopelvic abscess EXAM: CT-guided placement of drainage catheter into abdominopelvic abscess TECHNIQUE: Multidetector CT imaging of the abdomen and pelvis was performed following the standard protocol without IV contrast. RADIATION DOSE REDUCTION: This exam was performed according to the departmental dose-optimization program which includes automated exposure control, adjustment of the mA and/or kV according to patient size and/or use of iterative reconstruction technique. MEDICATIONS: Per EMR ANESTHESIA/SEDATION: Local analgesia COMPLICATIONS: None immediate. PROCEDURE: Informed written consent was obtained from the patient after a thorough discussion of the procedural risks, benefits and alternatives. All questions were  addressed. Maximal Sterile Barrier Technique was utilized including caps, mask, sterile gowns, sterile gloves, sterile drape, hand hygiene and skin antiseptic. A timeout was performed prior to the initiation of the procedure. The patient was placed supine on the exam table. Limited CT of the abdomen and pelvis was performed for planning purposes. This again demonstrated an air in fluid collection in the lower abdomen and pelvis. Skin entry site was marked with a planned anterolateral approach from the left lower quadrant. The overlying skin was prepped and draped in a standard sterile fashion. Local analgesia was obtained with 1% lidocaine. Using intermittent CT fluoroscopy, an 18 gauge trocar needle was advanced towards the identified abdominopelvic fluid collection. Access was confirmed with CT and return of purulent material. An 035 wire was then advanced through the access needle, over which the percutaneous tract was serially dilated to accommodate a 12 [place] multipurpose locking drainage catheter. Location was again confirmed with CT and return of additional purulent material. Catheter was secured to the skin using silk suture and a dressing. It was attached to bulb suction. The patient tolerated the procedure well without immediate complication. IMPRESSION: Successful CT-guided placement of a 12 French locking drainage catheter into the lower abdominopelvic abscess. Sample sent to the lab for microbiology analysis. Electronically Signed   By: Albin Felling M.D.   On: 03/22/2022 15:12   CT ABDOMEN PELVIS W CONTRAST  Result Date: 03/21/2022 CLINICAL DATA:  Abdominal pain, post-op Abdominal pain, acute, nonlocalized. Robotic salpingectomy 03/18/2022 EXAM: CT ABDOMEN AND PELVIS WITH CONTRAST TECHNIQUE: Multidetector CT imaging of the abdomen and pelvis was performed using the standard protocol following bolus administration of intravenous contrast. RADIATION DOSE REDUCTION: This exam was performed according  to the departmental dose-optimization program which includes automated exposure control, adjustment of the mA and/or kV according to patient size and/or use of iterative reconstruction technique. CONTRAST:  142m OMNIPAQUE IOHEXOL 300 MG/ML  SOLN COMPARISON:  None Available. FINDINGS: Lower chest: Left base atelectasis.  No acute abnormality. Hepatobiliary: Liver is enlarged measuring up to 19 cm. The hepatic parenchyma is diffusely hypodense compared to the splenic parenchyma consistent with fatty infiltration. No focal liver abnormality. No gallstones, gallbladder wall thickening, or pericholecystic fluid. No biliary dilatation. Pancreas: No focal lesion. Normal pancreatic contour. No surrounding inflammatory changes. No main  pancreatic ductal dilatation. Spleen: Normal in size without focal abnormality. Adrenals/Urinary Tract: No adrenal nodule bilaterally. Bilateral kidneys enhance symmetrically. No hydronephrosis. No hydroureter. The urinary bladder is unremarkable. On delayed imaging, there is no urothelial wall thickening and there are no filling defects in the opacified portions of the bilateral collecting systems or ureters as well as urinary bladder. Stomach/Bowel: Stomach is within normal limits. No evidence of bowel wall thickening or dilatation. Appendix appears normal. Vascular/Lymphatic: No abdominal aorta or iliac aneurysm. No abdominal, pelvic, or inguinal lymphadenopathy. Reproductive: Uterus is unremarkable no adnexal mass identified. Other: No intraperitoneal free fluid. Small volume free intraperitoneal gas. There is a gas and fluid collection with peripheral enhancement measuring approximately 8 x 9 x 7.5 cm centered within the left rectouterine pouch. Musculoskeletal: No abdominal wall hernia or abnormality. No suspicious lytic or blastic osseous lesions. No acute displaced fracture. IMPRESSION: 1. An 8 x 9 x 7.5cm gas and fluid collection centered within the left rectouterine pouch likely  represents a developing abscess. No findings to suggest bowel injury; however, this is not fully excluded. If a CT is repeated in the near future, consider use of IV and PO contrast. 2. Small volume pneumoperitoneum likely postsurgical in etiology. 3. Hepatomegaly and hepatic steatosis. Electronically Signed   By: Iven Finn M.D.   On: 03/21/2022 18:43            LOS: 4 days      Emeterio Reeve, DO [facility] 03/25/2022, 4:37 PM   Staff may message me via secure chat in [place]  but this may not receive immediate response,  please page for urgent matters!  If 7PM-7AM, please contact night-coverage [email or web address]  Dictation software was used to generate the above note. Typos may occur and escape review, as with typed/written notes. Please contact Dr Sheppard Coil directly for clarity if needed.

## 2022-03-25 NOTE — Assessment & Plan Note (Signed)
   Supplemental O2 prn goal >90%  bronchodilators   Incentive spirometry

## 2022-03-25 NOTE — Hospital Course (Addendum)
36 yo F presenting to [facility] ED from home on 03/21/22 with complaints of sudden onset severe abdominal pain. She recently on 03/18/22 had robotic adhesiolysis, left salpingectomy, drainage of 500 mL from large LEFT peritoneal cyst & chromotubation. She was discharged home on 8/67 without complication. She reported being on the floor with her cat and twisting her body around, with sudden acute abdominal pain. Patient complained of severe pain in lower abdomen radiating towards her vagina.  07/27: CT imaging abscess. Leukocytosis with left shift at 16.8. Initiated on IV Zosyn and Doxycycline. Admitted to OBGYN.  07/28: IR procedure 07/28 CT drain placement into pelvic abscess, left anterior approach, 12 Fr to bulb suction. Patient developed severe sepsis/septic shock.  She required transfer to stepdown unit, due to concerns for respiratory status PCCM consulted, receiving BS abx w/ zosyn, doxycycline & vancomycin, volume resuscitation w/ IVF, awaiting culture results   07/29: Remains in SDU w/ ileus and sepsis. IV antibiotics. Repeat CT abdomen pelvis , new loculations on the right, anasarca, distended urinary bladder. No contrast extravasation to suggest bowel perforation 07/30: Continues to have abdominal pain, required NG, abdomen less distended.  07/31: repeat CT The abscess is slightly decreased compared to prior exam. Continuing on Unasyn and Diflucan.  08/01: VSS, WBC trending down, BCx NGx4d. Ileus no better. Drain in place, minimal output. NG in place. PICC and TPN initiated. Spoke w/ GYN, plan for surgical consult and ID consult.  8/02: Patient continued to have persistently elevated white cell count with abdominal pain.  General surgery has scheduled exploratory laparotomy. 8/03: She  underwent Exploratory laparotomy, sigmoid colectomy with colostomy creation, Hartmann procedure, lysis of additions.  Continue TPN and IV antibiotics. 08/04: Leukocytosis persist could be due to stress response.  Remains  afebrile, continue NPO. and TPN, wound care consult, colostomy education. 08/05: NG tube removed, Foley removed.  Patient started on clear liquid diet, advance as tolerated. 08/07: WBC remains elevated, continues to have pain.  General surgery ordered CT abdomen and pelvis to rule out intra-abdominal process. 08/08: Repeat CT scan abdomen shows persistent pockets of fluid. IR performed CT-guided placement of drain x 2 into RLQ, LLQ/pelvis. 08/09: WBC improving, tolerating CLD trial FLD today.  08/10: tolerating FLD, pain control overnight was an issue, meds adjusted his AM per Gen Surg team, removed surgical drain, continue IR drains and wound VAC. Continue IV Abx (meropenem); new Cx pending (NGx12h); ID on board - plan for possible discontinuing antibiotics Monday 08/14.  08/11: Will start weaning TPN.

## 2022-03-25 NOTE — Assessment & Plan Note (Signed)
   NG in place  PICC and TPN today 03/26/22   Po diet as tolerated   General surgery consulted

## 2022-03-25 NOTE — Plan of Care (Signed)
  Problem: Education: Goal: Knowledge of General Education information will improve Description: Including pain rating scale, medication(s)/side effects and non-pharmacologic comfort measures Outcome: Progressing   Problem: Health Behavior/Discharge Planning: Goal: Ability to manage health-related needs will improve Outcome: Progressing   Problem: Clinical Measurements: Goal: Ability to maintain clinical measurements within normal limits will improve Outcome: Progressing Goal: Will remain free from infection Outcome: Progressing Goal: Diagnostic test results will improve Outcome: Progressing Goal: Respiratory complications will improve Outcome: Progressing Goal: Cardiovascular complication will be avoided Outcome: Progressing   Problem: Activity: Goal: Risk for activity intolerance will decrease Outcome: Progressing   Problem: Nutrition: Goal: Adequate nutrition will be maintained Outcome: Progressing   Problem: Coping: Goal: Level of anxiety will decrease Outcome: Progressing   Problem: Elimination: Goal: Will not experience complications related to bowel motility Outcome: Progressing Goal: Will not experience complications related to urinary retention Outcome: Progressing   Problem: Pain Managment: Goal: General experience of comfort will improve Outcome: Progressing   Problem: Safety: Goal: Ability to remain free from injury will improve Outcome: Progressing   Problem: Skin Integrity: Goal: Risk for impaired skin integrity will decrease Outcome: Progressing   Problem: Education: Goal: Knowledge of the prescribed therapeutic regimen will improve Outcome: Progressing   Problem: Skin Integrity: Goal: Demonstration of wound healing without infection will improve Outcome: Progressing   Problem: Fluid Volume: Goal: Hemodynamic stability will improve Outcome: Progressing   Problem: Clinical Measurements: Goal: Diagnostic test results will improve Outcome:  Progressing Goal: Signs and symptoms of infection will decrease Outcome: Progressing   Problem: Respiratory: Goal: Ability to maintain adequate ventilation will improve Outcome: Progressing

## 2022-03-26 ENCOUNTER — Inpatient Hospital Stay: Payer: No Typology Code available for payment source

## 2022-03-26 ENCOUNTER — Inpatient Hospital Stay: Payer: Self-pay

## 2022-03-26 ENCOUNTER — Encounter: Payer: No Typology Code available for payment source | Admitting: Obstetrics and Gynecology

## 2022-03-26 DIAGNOSIS — J9600 Acute respiratory failure, unspecified whether with hypoxia or hypercapnia: Secondary | ICD-10-CM

## 2022-03-26 DIAGNOSIS — K567 Ileus, unspecified: Secondary | ICD-10-CM | POA: Diagnosis not present

## 2022-03-26 DIAGNOSIS — A419 Sepsis, unspecified organism: Secondary | ICD-10-CM

## 2022-03-26 DIAGNOSIS — R6520 Severe sepsis without septic shock: Secondary | ICD-10-CM

## 2022-03-26 DIAGNOSIS — T8143XA Infection following a procedure, organ and space surgical site, initial encounter: Secondary | ICD-10-CM | POA: Diagnosis not present

## 2022-03-26 DIAGNOSIS — N739 Female pelvic inflammatory disease, unspecified: Secondary | ICD-10-CM

## 2022-03-26 DIAGNOSIS — J9601 Acute respiratory failure with hypoxia: Secondary | ICD-10-CM | POA: Diagnosis not present

## 2022-03-26 DIAGNOSIS — K631 Perforation of intestine (nontraumatic): Secondary | ICD-10-CM

## 2022-03-26 DIAGNOSIS — K9189 Other postprocedural complications and disorders of digestive system: Secondary | ICD-10-CM

## 2022-03-26 DIAGNOSIS — R109 Unspecified abdominal pain: Secondary | ICD-10-CM

## 2022-03-26 HISTORY — DX: Acute respiratory failure, unspecified whether with hypoxia or hypercapnia: J96.00

## 2022-03-26 HISTORY — DX: Sepsis, unspecified organism: A41.9

## 2022-03-26 LAB — CBC WITH DIFFERENTIAL/PLATELET
Abs Immature Granulocytes: 1.48 10*3/uL — ABNORMAL HIGH (ref 0.00–0.07)
Basophils Absolute: 0.1 10*3/uL (ref 0.0–0.1)
Basophils Relative: 1 %
Eosinophils Absolute: 0.1 10*3/uL (ref 0.0–0.5)
Eosinophils Relative: 1 %
HCT: 33 % — ABNORMAL LOW (ref 36.0–46.0)
Hemoglobin: 11.1 g/dL — ABNORMAL LOW (ref 12.0–15.0)
Immature Granulocytes: 14 %
Lymphocytes Relative: 9 %
Lymphs Abs: 1 10*3/uL (ref 0.7–4.0)
MCH: 29.1 pg (ref 26.0–34.0)
MCHC: 33.6 g/dL (ref 30.0–36.0)
MCV: 86.4 fL (ref 80.0–100.0)
Monocytes Absolute: 0.6 10*3/uL (ref 0.1–1.0)
Monocytes Relative: 6 %
Neutro Abs: 7.5 10*3/uL (ref 1.7–7.7)
Neutrophils Relative %: 69 %
Platelets: 165 10*3/uL (ref 150–400)
RBC: 3.82 MIL/uL — ABNORMAL LOW (ref 3.87–5.11)
RDW: 13.3 % (ref 11.5–15.5)
Smear Review: NORMAL
WBC: 10.8 10*3/uL — ABNORMAL HIGH (ref 4.0–10.5)
nRBC: 0.2 % (ref 0.0–0.2)

## 2022-03-26 LAB — BASIC METABOLIC PANEL
Anion gap: 10 (ref 5–15)
BUN: 18 mg/dL (ref 6–20)
CO2: 25 mmol/L (ref 22–32)
Calcium: 8 mg/dL — ABNORMAL LOW (ref 8.9–10.3)
Chloride: 102 mmol/L (ref 98–111)
Creatinine, Ser: 0.53 mg/dL (ref 0.44–1.00)
GFR, Estimated: >60 mL/min (ref >60)
Glucose, Bld: 72 mg/dL (ref 70–99)
Potassium: 3.7 mmol/L (ref 3.5–5.1)
Sodium: 137 mmol/L (ref 135–145)

## 2022-03-26 LAB — PHOSPHORUS: Phosphorus: 4.8 mg/dL — ABNORMAL HIGH (ref 2.5–4.6)

## 2022-03-26 LAB — MAGNESIUM: Magnesium: 2.3 mg/dL (ref 1.7–2.4)

## 2022-03-26 MED ORDER — INSULIN ASPART 100 UNIT/ML IJ SOLN
0.0000 [IU] | Freq: Four times a day (QID) | INTRAMUSCULAR | Status: DC
  Administered 2022-03-27 (×2): 2 [IU] via SUBCUTANEOUS
  Administered 2022-03-27: 5 [IU] via SUBCUTANEOUS
  Administered 2022-03-28: 2 [IU] via SUBCUTANEOUS
  Administered 2022-03-28: 5 [IU] via SUBCUTANEOUS
  Administered 2022-03-28 (×2): 3 [IU] via SUBCUTANEOUS
  Administered 2022-03-29 – 2022-04-02 (×8): 2 [IU] via SUBCUTANEOUS
  Administered 2022-04-02: 3 [IU] via SUBCUTANEOUS
  Administered 2022-04-02 – 2022-04-03 (×2): 2 [IU] via SUBCUTANEOUS
  Administered 2022-04-03: 3 [IU] via SUBCUTANEOUS
  Administered 2022-04-03 (×2): 2 [IU] via SUBCUTANEOUS
  Administered 2022-04-04: 3 [IU] via SUBCUTANEOUS
  Administered 2022-04-04 – 2022-04-05 (×3): 2 [IU] via SUBCUTANEOUS
  Administered 2022-04-05: 3 [IU] via SUBCUTANEOUS
  Administered 2022-04-05 (×2): 2 [IU] via SUBCUTANEOUS
  Administered 2022-04-05 – 2022-04-06 (×2): 3 [IU] via SUBCUTANEOUS
  Filled 2022-03-26 (×30): qty 1

## 2022-03-26 MED ORDER — SODIUM CHLORIDE 0.9% FLUSH
10.0000 mL | INTRAVENOUS | Status: DC | PRN
  Administered 2022-04-03: 10 mL

## 2022-03-26 MED ORDER — ALBUMIN HUMAN 25 % IV SOLN
25.0000 g | Freq: Once | INTRAVENOUS | Status: AC
  Administered 2022-03-26: 25 g via INTRAVENOUS
  Filled 2022-03-26: qty 100

## 2022-03-26 MED ORDER — IOHEXOL 300 MG/ML  SOLN
450.0000 mL | Freq: Once | INTRAMUSCULAR | Status: AC | PRN
  Administered 2022-04-08: 100 mL

## 2022-03-26 MED ORDER — SODIUM CHLORIDE 0.9 % IV SOLN: INTRAVENOUS | Status: DC

## 2022-03-26 MED ORDER — SODIUM CHLORIDE 0.9% FLUSH
10.0000 mL | Freq: Two times a day (BID) | INTRAVENOUS | Status: DC
  Administered 2022-03-26: 10 mL
  Administered 2022-03-26: 20 mL
  Administered 2022-03-27 – 2022-04-10 (×26): 10 mL

## 2022-03-26 MED ORDER — PANTOPRAZOLE SODIUM 40 MG PO TBEC: 40.0000 mg | DELAYED_RELEASE_TABLET | Freq: Every day | ORAL | Status: DC

## 2022-03-26 MED ORDER — TRACE MINERALS CU-MN-SE-ZN 300-55-60-3000 MCG/ML IV SOLN
INTRAVENOUS | Status: AC
  Filled 2022-03-26: qty 360

## 2022-03-26 MED ORDER — KETOROLAC TROMETHAMINE 15 MG/ML IJ SOLN
15.0000 mg | Freq: Three times a day (TID) | INTRAMUSCULAR | Status: DC | PRN
  Administered 2022-03-26 – 2022-03-27 (×3): 15 mg via INTRAVENOUS
  Filled 2022-03-26 (×3): qty 1

## 2022-03-26 MED ORDER — HYDROMORPHONE HCL 1 MG/ML IJ SOLN
0.5000 mg | INTRAMUSCULAR | Status: DC | PRN
  Administered 2022-03-26 – 2022-03-28 (×6): 0.5 mg via INTRAVENOUS
  Filled 2022-03-26 (×7): qty 1

## 2022-03-26 MED ORDER — PANTOPRAZOLE SODIUM 40 MG IV SOLR
40.0000 mg | Freq: Every day | INTRAVENOUS | Status: DC
  Administered 2022-03-27 – 2022-03-29 (×4): 40 mg via INTRAVENOUS
  Filled 2022-03-26 (×4): qty 10

## 2022-03-26 NOTE — Progress Notes (Signed)
Peripherally Inserted Central Catheter Placement  The IV Nurse has discussed with the patient and/or persons authorized to consent for the patient, the purpose of this procedure and the potential benefits and risks involved with this procedure.  The benefits include less needle sticks, lab draws from the catheter, and the patient may be discharged home with the catheter. Risks include, but not limited to, infection, bleeding, blood clot (thrombus formation), and puncture of an artery; nerve damage and irregular heartbeat and possibility to perform a PICC exchange if needed/ordered by physician.  Alternatives to this procedure were also discussed.  Bard Power PICC patient education guide, fact sheet on infection prevention and patient information card has been provided to patient /or left at bedside.    PICC Placement Documentation  PICC Double Lumen 40/98/11 Right Basilic 36 cm 1 cm (Active)  Indication for Insertion or Continuance of Line Administration of hyperosmolar/irritating solutions (i.e. TPN, Vancomycin, etc.) 03/26/22 1428  Exposed Catheter (cm) 1 cm 03/26/22 1428  Site Assessment Clean, Dry, Intact 03/26/22 1428  Lumen #1 Status Flushed;Blood return noted;Saline locked 03/26/22 1428  Lumen #2 Status Flushed;Blood return noted;Saline locked 03/26/22 1428  Dressing Type Transparent 03/26/22 1428  Dressing Status Antimicrobial disc in place 03/26/22 1428  Dressing Change Due 04/02/22 03/26/22 1428       Lacey Harrison 03/26/2022, 2:30 PM

## 2022-03-26 NOTE — Assessment & Plan Note (Signed)
   GYN following  ID and general surgery consulted today

## 2022-03-26 NOTE — Progress Notes (Addendum)
GYNECOLOGY INPATIENT PROGRESS NOTE  Post-Operative Day #8 s/p robotic assisted laparosocopic lysis of adhesions, left salpingectomy, drainage of large peritoneal cyst, chromotubation Hospital Day # 5, readmitted for suspected pelvic abscess, developing sepsis.   POD#4 s/p CT guided drainage of pelvic abscess  Subjective: Patient reports that she is still having abdominal pain.  Also noting some pain in her vagina. Feels like her catheter is pulling.  Patient's mother also feels that her abdomen appears to be swelling again. Is concerned that her daughter may be requiring surgery as she was informed that there was Clostridium identified on the culture and is worried about gangrene.    Objective: Temp:  [98.6 F (37 C)-99.6 F (37.6 C)] 98.6 F (37 C) (08/01 0400) Pulse Rate:  [81-100] 93 (08/01 0800) Resp:  [16-20] 18 (08/01 0400) BP: (93-111)/(64-81) 107/77 (08/01 0800) SpO2:  [94 %-99 %] 96 % (08/01 0800)    I/O last 3 completed shifts: In: 0  Out: 2015 [Urine:1800; Emesis/NG output:200; Drains:15] Total I/O In: -  Out: 525 [Urine:525]     Medications:  Scheduled Meds:  Chlorhexidine Gluconate Cloth  6 each Topical Daily   docusate sodium  100 mg Oral BID   heparin injection (subcutaneous)  5,000 Units Subcutaneous Q8H   pantoprazole  40 mg Oral QHS   sodium chloride flush  5 mL Intracatheter Q8H   Continuous Infusions:  sodium chloride Stopped (03/22/22 1328)   ampicillin-sulbactam (UNASYN) IV 3 g (03/26/22 0410)   fluconazole (DIFLUCAN) IV 400 mg (03/25/22 1744)   PRN Meds:.sodium chloride, albuterol, alum & mag hydroxide-simeth, bisacodyl, diazepam, ketorolac, morphine injection, ondansetron **OR** ondansetron (ZOFRAN) IV, simethicone, zolpidem     Physical Exam:  Vitals:   03/26/22 0200 03/26/22 0400 03/26/22 0700 03/26/22 0800  BP: 109/76 108/81 111/72 107/77  Pulse: 85 81 85 93  Resp:  18    Temp:  98.6 F (37 C)    TempSrc:  Oral    SpO2: 96% 96%  97% 96%  Weight:      Height:         General: alert and no acute distress , on 2L [place] Lungs: clear to auscultation bilaterally Heart:  tachycardia, regular rhythm Abdomen: normal findings: no masses palpable and soft and abnormal findings:  tenderness not in proportion to clinical picture in all quadrants, also wrapping around sides. No  Incision sites covered with dressing, intact, no drainage.  Pelvic: external genitalia normal, mild left labia majora swelling, tender to palpation, no erythema or induration present.  Extremities: extremities normal, atraumatic, no cyanosis or edema Skin: Skin color, texture, turgor normal. No rashes or lesions Neurologic: Grossly normal    Labs:      Latest Ref Rng & Units 03/26/2022    6:22 AM 03/25/2022    3:53 AM 03/24/2022    4:47 AM  CBC EXTENDED  WBC 4.0 - 10.5 K/uL 10.8  13.0  13.3   RBC 3.87 - 5.11 MIL/uL 3.82  3.75  4.32   Hemoglobin 12.0 - 15.0 g/dL 11.1  10.8  12.3   HCT 36.0 - 46.0 % 33.0  32.3  36.4   Platelets 150 - 400 K/uL 165  189  213   NEUT# 1.7 - 7.7 K/uL 7.5     Lymph# 0.7 - 4.0 K/uL 1.0           Latest Ref Rng & Units 03/26/2022    6:22 AM 03/25/2022    3:53 AM 03/24/2022    4:47  AM  CMP  Glucose 70 - 99 mg/dL 72  81  119   BUN 6 - 20 mg/dL '18  14  11   '$ Creatinine 0.44 - 1.00 mg/dL 0.53  0.82  0.68   Sodium 135 - 145 mmol/L 137  135  133   Potassium 3.5 - 5.1 mmol/L 3.7  3.0  3.0   Chloride 98 - 111 mmol/L 102  97  98   CO2 22 - 32 mmol/L '25  30  26   '$ Calcium 8.9 - 10.3 mg/dL 8.0  7.6  7.6   Total Protein 6.5 - 8.1 g/dL  5.1  5.6   Total Bilirubin 0.3 - 1.2 mg/dL  3.4  3.3   Alkaline Phos 38 - 126 U/L  49  62   AST 15 - 41 U/L  18  20   ALT 0 - 44 U/L  14  21     Results for orders placed or performed during the hospital encounter of 03/21/22  Culture, blood (x 2)   Specimen: BLOOD  Result Value Ref Range   Specimen Description BLOOD BLOOD LEFT HAND    Special Requests      BOTTLES DRAWN AEROBIC AND  ANAEROBIC Blood Culture adequate volume   Culture      NO GROWTH 3 DAYS Performed at [facility], [address] [place], [place] [address]    Report Status PENDING   Culture, blood (x 2)   Specimen: BLOOD  Result Value Ref Range   Specimen Description BLOOD BLOOD RIGHT HAND    Special Requests AEROBIC BOTTLE ONLY Blood Culture adequate volume    Culture      NO GROWTH 3 DAYS Performed at [facility], [address] [place], [place] [address]    Report Status PENDING   Aerobic/Anaerobic Culture w Gram Stain (surgical/deep wound)   Specimen: Wound; Abscess  Result Value Ref Range   Specimen Description      WOUND Performed at [facility], [address] [place], [place] [address]    Special Requests      ABD ABSCESS Performed at [facility], [address] [place], [place] [address]    Gram Stain      ABUNDANT WBC PRESENT,BOTH PMN AND MONONUCLEAR ABUNDANT GRAM NEGATIVE RODS FEW GRAM POSITIVE RODS FEW GRAM POSITIVE COCCI IN PAIRS FEW BUDDING YEAST SEEN    Culture      ABUNDANT ESCHERICHIA COLI MODERATE CLOSTRIDIUM PERFRINGENS Standardized susceptibility testing for this organism is not available. ABUNDANT BACTEROIDES VULGATUS BETA LACTAMASE NEGATIVE Performed at [facility], [place] [address] [place], [place] [address]    Report Status 03/25/2022 FINAL    Organism ID, Bacteria ESCHERICHIA COLI       Susceptibility   Escherichia coli - MIC*    AMPICILLIN 8 SENSITIVE Sensitive     CEFAZOLIN <=4 SENSITIVE Sensitive     CEFEPIME <=0.12 SENSITIVE Sensitive     CEFTAZIDIME <=1 SENSITIVE Sensitive     CEFTRIAXONE <=0.25 SENSITIVE Sensitive     CIPROFLOXACIN <=0.25 SENSITIVE Sensitive     GENTAMICIN <=1 SENSITIVE Sensitive     IMIPENEM <=0.25 SENSITIVE Sensitive     TRIMETH/SULFA <=20 SENSITIVE Sensitive     AMPICILLIN/SULBACTAM <=2 SENSITIVE Sensitive     PIP/TAZO <=4 SENSITIVE Sensitive     * ABUNDANT ESCHERICHIA  COLI  MRSA Next Gen by PCR, Nasal   Specimen: Nasal Mucosa; Nasal Swab  Result Value Ref Range   MRSA by PCR  Next Gen NOT DETECTED NOT DETECTED     Imaging:  CT ABDOMEN PELVIS W CONTRAST CLINICAL DATA:  Acute abdominal pain status post surgery.  EXAM: CT ABDOMEN AND PELVIS WITH CONTRAST  TECHNIQUE: Multidetector CT imaging of the abdomen and pelvis was performed using the standard protocol following bolus administration of intravenous contrast.  RADIATION DOSE REDUCTION: This exam was performed according to the departmental dose-optimization program which includes automated exposure control, adjustment of the mA and/or kV according to patient size and/or use of iterative reconstruction technique.  CONTRAST:  13m OMNIPAQUE IOHEXOL 300 MG/ML  SOLN  COMPARISON:  February 21, 2022.  FINDINGS: Lower chest: Mild bilateral posterior basilar subsegmental atelectasis is noted.  Hepatobiliary: Hepatic steatosis. No gallstones or biliary dilatation is noted.  Pancreas: Unremarkable. No pancreatic ductal dilatation or surrounding inflammatory changes.  Spleen: Normal in size without focal abnormality.  Adrenals/Urinary Tract: Adrenal glands and kidneys appear normal. No hydronephrosis or renal obstruction is noted. Urinary bladder is decompressed secondary to Foley catheter.  Stomach/Bowel: Nasogastric tube tip is seen with tip in proximal stomach. The stomach is otherwise unremarkable. The appendix appears normal. Mild proximal small bowel dilatation is noted which is improved slightly compared to prior exam.  Vascular/Lymphatic: No significant vascular findings are present. No enlarged abdominal or pelvic lymph nodes.  Reproductive: Status post left salpingectomy. Right adnexal region and uterus are unremarkable.  Other: Continued presence of percutaneous drainage catheter seen in left-sided pelvic abscess. The abscess currently measures 8.6 x 3.3 cm which is slightly  decreased compared to prior exam. Mild ascites is noted in the pelvis and right side of the abdomen. No hernia is noted. Mild amount of pneumoperitoneum remains consistent with recent postoperative status.  Musculoskeletal: No acute or significant osseous findings.  IMPRESSION: Continued presence of percutaneous drainage catheter is seen in left-sided abdominal abscess. The abscess currently measures 8.6 x 3.3 cm which is slightly decreased compared to prior exam.  Mild proximal small bowel dilatation is noted which is slightly improved compared to prior exam, most consistent with ileus.  Mild amount of pneumoperitoneum is noted consistent with recent postoperative status.  Stable mild ascites is noted in the pelvis and right side of the abdomen.  Hepatic steatosis.  Urinary bladder is decompressed secondary to Foley catheter.  Mild bilateral posterior basilar subsegmental atelectasis is noted.  Electronically Signed   By: JMarijo ConceptionM.D.   On: 03/25/2022 13:05   CT CHEST, ABDOMEN AND PELVIS WITHOUT CONTRAST (03/23/2022) CLINICAL DATA:  History of robotic salpingectomy on 03/18/2022. Patient is now status post CT-guided placement of a drainage catheter into abdominopelvic abscess on 03/22/2022. Patient is having severe upper abdominal pain.   EXAM: CT CHEST, ABDOMEN AND PELVIS WITHOUT CONTRAST   TECHNIQUE: Multidetector CT imaging of the chest, abdomen and pelvis was performed following the standard protocol without IV contrast.   RADIATION DOSE REDUCTION: This exam was performed according to the departmental dose-optimization program which includes automated exposure control, adjustment of the mA and/or kV according to patient size and/or use of iterative reconstruction technique.   COMPARISON:  None Available.   CT abdomen pelvis 03/21/2022.   FINDINGS: CT CHEST FINDINGS   Cardiovascular: No significant vascular findings. Normal heart size. No  pericardial effusion.   Mediastinum/Nodes: No enlarged mediastinal or axillary lymph nodes. Hilar lymph nodes are not well visualized on the unenhanced exam. Thyroid gland, trachea, and esophagus demonstrate no significant findings.   Lungs/Pleura: Segmental atelectasis in the medial lower lobes. Additional non segmental  atelectasis in the right upper lobe. No pleural effusion or pneumothorax.   Musculoskeletal: No chest wall mass or suspicious bone lesions identified.   CT ABDOMEN PELVIS FINDINGS   Hepatobiliary: Diffuse decreased attenuation of the liver. No focal liver abnormality is seen. Increased bile density within the gallbladder, likely reflecting vicarious contrast excretion. No gallstones, gallbladder wall thickening, or biliary dilatation.   Pancreas: Unremarkable. No pancreatic ductal dilatation or surrounding inflammatory changes.   Spleen: Normal in size without focal abnormality.   Adrenals/Urinary Tract: Adrenal glands are unremarkable. Kidneys are normal, without renal calculi, focal lesion, or hydronephrosis. The urinary bladder is markedly distended but otherwise unremarkable.   Stomach/Bowel: The stomach is moderately distended. Multiple dilated proximal and mid small bowel loops measuring up to 3.9 cm in diameter with air-fluid levels (series 7, image 31) the distal ileum is decompressed. There is a gradual tapering of the small bowel caliber without distinct transition point. Submucosal fat deposition throughout the ascending colon, suggestive of sequela of chronic inflammation. The colon is decompressed with mild bowel wall thickening of the sigmoid colon adjacent to the abdominopelvic abscess.   Vascular/Lymphatic: No significant vascular findings are present. No enlarged abdominal or pelvic lymph nodes.   Reproductive: Uterus and right adnexa are unremarkable.   Other: Interval placement of a pigtail drainage catheter within the loculated gas  containing fluid collection centered at the left rectouterine pouch. The fluid collection is mildly decreased in size compared to 03/21/2022, measuring 12.8 cm craniocaudal dimension x 4.8 cm AP dimension on sagittal plane compared to 13 cm x 6.4 cm on previous study when measured in a similar fashion (series 6, image 78).   Since 03/21/2022, there is increased abdominopelvic ascites, particularly within the right mid and lower abdomen. A fluid collection in the right lower contains a few small foci of air but does not have a thin rim of enhancement to suggest a loculated fluid collection (series 7, image 60). Small amount of non dependent free air is again noted within the upper abdomen, consistent with recent postoperative status.   Musculoskeletal: No acute or significant osseous findings.   IMPRESSION: CT chest:   1. Segmental atelectasis in the lower lobes and additional subsegmental atelectasis in the right upper lobe. No pleural effusion.   CT abdomen pelvis: Interval   1. Mild interval decreased size of the abdominopelvic abscess centered in the left rectouterine pouch status post pigtail drainage catheter placement. 2. New abdominopelvic ascites, particularly within the right abdomen, without discrete loculated drainable fluid collection. 3. New dilatation of multiple small bowel loops with air-fluid levels and moderate distention of the gas and fluid-filled stomach. No distinct transition point. Findings are favored to represent adynamic ileus secondary to inflammatory changes in the left pelvis. 4. Diffuse mild bowel wall thickening of the sigmoid colon secondary to adjacent inflammatory changes related to abdominopelvic abscess. 5. Small volume pneumoperitoneum, consistent with recent postoperative status. 6. Hepatic steatosis.     Electronically Signed   By: Ileana Roup M.D.   On: 03/23/2022 15:47   Assessment/Plan: Severe Sepsis - Managing per ICU  Intensivist Continue IV antibiotic course (Zosyn and Diflucan, Clindamycin discontinued).  WBCs now trending downward again, Continue to monitor trend.  Patient has remained afebrile throughout admission Patient's tachycardic finally resolving.  S/p CT guided drainage of suspected pelvic abscess 5 days ago, small amount of brownish thin fluid draining. Does not appear purulent. Loculated fluid collection appears to be getting smaller (previously 12.8 x 4 cm, now down to  8.6 x 3 cm). Cultures noted.  Blood cultures negative.  Acute hypoxic respiratory distress Appears to be resolving. Currently continuing on 2L [place]. May likely be able to wean down as tolerated.  Acute kidney injury Resolved.  Concern for ileus vs SBO Currently NPO, fluid less bilious, output decreased from total of ~1400 ml yesterday to ~ 200 ml today. Repeat CT scan yesterday noting some improvement in ileus. Patient's family still concerned for patient's need for surgery.  Can consider consultation from General Surgery to weigh in however at this time I do not believe surgical intervention is mandatory. Pain does still seem to be out of proportion as overall clinically labs and imaging show signs of improvement.    A total of 36 minutes were spent during this encounter, including review of previous progress notes, recent imaging and labs, face-to-face with time with patient involving counseling and coordination of care, as well as documentation for current visit.   Rubie Maid, MD [facility]

## 2022-03-26 NOTE — Progress Notes (Signed)
       CROSS COVER NOTE  NAME: Lacey Harrison MRN: [id number] DOB : [birth year]    Date of Service   03/26/2022  HPI/Events of Note   Notified by nursing that [name] is experiencing ongoing abdominal pain and is febrile Temp 38.6C.   Interventions   Plan: Give PRN Toradol Protonix changed to IV while patient is NPO    This document was prepared using Dragon voice recognition software and may include unintentional dictation errors.  Neomia Glass DNP, MHA, FNP-BC Nurse Practitioner [facility] Pager [phone]

## 2022-03-26 NOTE — Progress Notes (Signed)
PHARMACIST - PHYSICIAN COMMUNICATION  CONCERNING: IV to Oral Route Change Policy  RECOMMENDATION: This patient is receiving pantoprazole by the intravenous route.  Based on criteria approved by the Pharmacy and Therapeutics Committee, the intravenous medication(s) is/are being converted to the equivalent oral dose form(s).   DESCRIPTION: These criteria include: The patient is eating (either orally or via tube) and/or has been taking other orally administered medications for a least 24 hours The patient has no evidence of active gastrointestinal bleeding or impaired GI absorption (gastrectomy, short bowel, patient on TNA or NPO).  If you have questions about this conversion, please contact the [place], [facility] 03/26/2022 8:27 AM

## 2022-03-26 NOTE — Consult Note (Addendum)
PHARMACY - TOTAL PARENTERAL NUTRITION CONSULT NOTE   Indication: Prolonged ileus  Patient Measurements: Height: '5\' 3"'$  (160 cm) Weight: 91 kg (200 lb 9.9 oz) IBW/kg (Calculated) : 52.4 TPN AdjBW (KG): 62.1 Body mass index is 35.54 kg/m.  Assessment:  Patient is [name] with medical history including recent laparoscopic adhesiolysis, left salpingectomy, drainage of peritoneal cyst, and chromotubation on 7/24 who presented to the ED 7/27 with severe pain in abdomen and vagina. Imaging concerning for post-operative abscess. Patient underwent CT drain placement into pelvic abscess on 7/28. Hospital course has been complicated by ileus. Pharmacy consulted to initiate TPN to provide nutrition given post-operative ileus.   Glucose / Insulin: No history of diabetes. Patient has been normoglycemic. Goal BG 140 - 180 Electrolytes: Mild hyperphosphatemia Renal: Scr < 1 Hepatic: No transaminitis. Mild hyperbilirubinemia Intake / Output; MIVF: I&O: + 7L. No MIVF GI Imaging: 7/31 CT abdomen / pelvis: Percutaneous drainage catheter is seen in left-sided abdominal abscess. Mild proximal small bowel dilatation is noted which is slightly improved compared to prior exam, most consistent with ileus. Stable mild ascites is noted in the pelvis and right side of the abdomen. GI Surgeries / Procedures:  7/28: CT drain placement into pelvic abscess  Central access: Pending 8/1 TPN start date: 8/1 upon placement of PICC  Nutritional Goals: Goal TPN rate is 75 mL/hr (provides 108 g of protein and 2217 kcals per day)  RD Assessment: Estimated Needs Total Energy Estimated Needs: 2000-2300kcal/day Total Protein Estimated Needs: 100-115g/day Total Fluid Estimated Needs: 1.6-1.8L/day  Current Nutrition:  NPO  Plan:  --Start TPN at 37.5 mL/hr (1/2 rate) at 1800 --Electrolytes in TPN: Na 56mq/L, K 552m/L, Ca 21m9mL, Mg 21mE63m, and Phos 121mm24m. Cl:Ac 1:1 --Add standard MVI and trace elements to TPN,  add thiamine 100 mg x 3 days (day # 1 / 3) --Initiate Moderate q6h SSI and adjust as needed  --Monitor TPN labs on Mon/Thurs, daily until stable  Jean Alejos Benita Gutter2023,11:09 AM

## 2022-03-26 NOTE — Progress Notes (Signed)
Initial Nutrition Assessment  DOCUMENTATION CODES:   Obesity unspecified  INTERVENTION:   TPN per pharmacy   Recommend thiamine 100mg  daily in TPN x 3 days  Pt at high refeed risk; recommend monitor potassium, magnesium and phosphorus labs daily until stable  Daily weights   NUTRITION DIAGNOSIS:   Inadequate oral intake related to acute illness as evidenced by NPO status.  GOAL:   Patient will meet greater than or equal to 90% of their needs  MONITOR:   Diet advancement, Labs, Weight trends, Skin, I & O's, Other (Comment) (TPN)  REASON FOR ASSESSMENT:   NPO/Clear Liquid Diet    ASSESSMENT:   36 y.o. G16P0010 female with h/o anxiety, depression, lyme's disease, migraines, infertility, pelvic pain, left hydrosalpinx, dermoid cyst s/p ovarian cystectomy with an incidental cystotomy and repair (2007), s/p laparoscopy with extensive abdominopelvic adhesiolysis  with chromopertubation (01/2018), endometriosis and s/p robotic-assisted laparoscopic adhesiolysis, left salpingectomy, drainage of peritoneal cyst, and chromotubation (8/84/16) complicated by pelvic abscess, AKI, sepsis and post op ileus.  -Pt s/p IR guided 24F lower abdominopelvic percutaneous drain 7/28   Met with pt in room today. Pt reports abdominal pain and distension today. CT scan from 7/31 reports dilated loops of small bowel consistent with adynamic ileus (this is improved from CT scan on 7/29). NGT in place with 140ml output; apparently had out 624ml yesterday. Pt denies any flatus or BM yet. Pt has remained NPO for most of her admission and is now without adequate nutrition for 6 days. Plan today is for PICC line and TPN. RD discussed with pt today her nutritional status and the importance of adequate nutrition needed to support post op healing and to preserve lean muscle. Pt is agreeable to drinking supplements once her diet is advanced. Pt is at high refeed risk. No new weight since admission; will order daily  weights. Drain in place with 12ml output. Pt + 7.5L on her I & Os.   Medications reviewed and include: colace, heparin, insulin, protonix, unasyn, diflucan  Labs reviewed: K 3.7 wnl, P 4.8(H), Mg 2.3 wnl Wbc- 10.8(H)  NUTRITION - FOCUSED PHYSICAL EXAM:  Flowsheet Row Most Recent Value  Orbital Region No depletion  Upper Arm Region No depletion  Thoracic and Lumbar Region No depletion  Buccal Region No depletion  [place] Region No depletion  Clavicle Bone Region No depletion  Clavicle and Acromion Bone Region No depletion  Scapular Bone Region No depletion  Dorsal Hand No depletion  Patellar Region No depletion  Anterior Thigh Region No depletion  Posterior Calf Region No depletion  Edema (RD Assessment) Mild  Hair Reviewed  Eyes Reviewed  Mouth Reviewed  Skin Reviewed  Nails Reviewed   Diet Order:   Diet Order             Diet NPO time specified Except for: Ice Chips  Diet effective now                  EDUCATION NEEDS:   Education needs have been addressed  Skin:  Skin Assessment: Reviewed RN Assessment (incision abdomen)  Last BM:  7/27  Height:   Ht Readings from Last 1 Encounters:  03/21/22 5\' 3"  (1.6 m)    Weight:   Wt Readings from Last 1 Encounters:  03/21/22 91 kg    Ideal Body Weight:  52.2 kg  BMI:  Body mass index is 35.54 kg/m.  Estimated Nutritional Needs:   Kcal:  2000-2300kcal/day  Protein:  100-115g/day  Fluid:  1.6-1.8L/day  Aaren Krog MS, RD, LDN Please refer to [facility] for RD and/or RD on-call/weekend/after hours pager  

## 2022-03-26 NOTE — Assessment & Plan Note (Signed)
Suspect related more to ileus since infection parameters are improving  Discussed would like to avoid opiates if possible as this will not improve ileus  Trial switching morphine to Dilaudid low-dose today  Patient encouraged out of bed/walking

## 2022-03-26 NOTE — Progress Notes (Signed)
D/w family and pt about contrast enema. Seems to be a small sigmoid injury and based on CT scan likely to be manage w drain. We will keep a close eye on her , if she fails medical rx may need loop ileostomy or Hartmann's/ . Family and pt aware of the situation. D/W Dr. Elwyn Lade and Dr. Sheppard Coil

## 2022-03-26 NOTE — Consult Note (Addendum)
NAME: Lacey Harrison  DOB: May 26, 1986  MRN: [id number]  Date/Time: 03/26/2022 7:25 PM  REQUESTING PROVIDER; Dr.Alexander Subjective:  REASON FOR CONSULT: pelvic abscess ? Lacey Harrison is a 36 y.o. female with a history of Infertility, previous gyn surgery for left ovarian cystectomy left for dermoid cyst complicated by cystotomy and bladder repair in 2007, pelvic pain underwent on 02/16/18 lap and extensive abdominopelvic adhesiolysis  presents with sudden onset abdominal pain Pt on 03/18/22 underwent the robotic assisted left salpingectomy and peritoneal/left salpinx cyst aspiration and adhesiolysis on the left side of the pelvis . She was doing okay until 7/27 when she was on the floor with her sick cat and twisted and felt a sudden sharp pain across her abdomen and vagina which made her come to the ED Vitals BP 93/60, Temp 99.5, Pulse 115, wbc 16.8, HB 15.3, plt 294 and cr 0.85 CT abdomen revealed a 8x9x7.5cm left rectouterine pouch fluid /gas collection  . IR placed a drain in the collection and culture grew e.coli, bacteroids, clostridium perfringes, budding yeast . She developed septic shock after drainage of abscess and was transferred to ICU She initially was on zosyn, doxy, clinda  Her antibiotics were deescalated to Unasyn and fluconazole On 7/28 she had developed small bowel ileus I am asked to see the patient for management of the infection Today surgery was consulted and they suspected bowel perforation as the organisms cultured were all colon bacteria She underwent water soluble contrast enema at 16.27 and then spiked a fever of 102 after an hour The imaging showed contrast extravasation from the sigmoid colon consistent with bowel injury and perforation. Pt is sleeping after getting dilaudid for pain  Past Medical History:  Diagnosis Date   Allergy    Anxiety    Chicken pox    Depression    Heart murmur    Lyme disease    patient reports a remote history of lyme and  alludes that she has "chronic lyme"   Migraines    MIGRAINES   Pneumonia 07/2017    Past Surgical History:  Procedure Laterality Date   CHROMOPERTUBATION  02/16/2018   Procedure: CHROMOPERTUBATION;  Surgeon: Schermerhorn, Gwen Her, MD;  Location: [facility];  Service: Gynecology;;   CHROMOPERTUBATION Left 03/18/2022   Procedure: CHROMOPERTUBATION;  Surgeon: Rubie Maid, MD;  Location: [facility];  Service: Gynecology;  Laterality: Left;   CYSTECTOMY Left    OVARY   DILATION AND CURETTAGE OF UTERUS  2008   LAPAROSCOPIC LYSIS OF ADHESIONS N/A 02/16/2018   Procedure: EXTENSIVE LAPAROSCOPIC LYSIS OF ADHESIONS;  Surgeon: Schermerhorn, Gwen Her, MD;  Location: [facility];  Service: Gynecology;  Laterality: N/A;   LAPAROSCOPY N/A 02/16/2018   Procedure: LAPAROSCOPY OPERATIVE;  Surgeon: Schermerhorn, Gwen Her, MD;  Location: [facility];  Service: Gynecology;  Laterality: N/A;   LYSIS OF ADHESION N/A 03/18/2022   Procedure: LYSIS OF ADHESION;  Surgeon: Rubie Maid, MD;  Location: [facility];  Service: Gynecology;  Laterality: N/A;   XI ROBOTIC ASSISTED SALPINGECTOMY Left 03/18/2022   Procedure: XI ROBOTIC ASSISTED SALPINGECTOMY;  Surgeon: Rubie Maid, MD;  Location: [facility];  Service: Gynecology;  Laterality: Left;    Social History   Socioeconomic History   Marital status: Married    Spouse name: Thurmond Butts   Number of children: Not on file   Years of education: Not on file   Highest education level: Not on file  Occupational History   Not on file  Tobacco Use   Smoking status: Some  Days    Packs/day: 0.75    Years: 14.00    Total pack years: 10.50    Types: Cigarettes    Last attempt to quit: 07/05/2016    Years since quitting: 5.7   Smokeless tobacco: Never  Vaping Use   Vaping Use: Former  Substance and Sexual Activity   Alcohol use: Not Currently    Alcohol/week: 11.0 standard drinks of alcohol    Types: 3 Glasses of wine, 2 Cans of beer, 3 Shots of liquor, 3 Standard drinks or equivalent  per week    Comment: almost daily   Drug use: No   Sexual activity: Yes    Birth control/protection: None  Other Topics Concern   Not on file  Social History Narrative   Not on file   Social Determinants of Health   Financial Resource Strain: Not on file  Food Insecurity: Not on file  Transportation Needs: Not on file  Physical Activity: Not on file  Stress: Not on file  Social Connections: Not on file  Intimate Partner Violence: Not on file    Family History  Problem Relation Age of Onset   Mental illness Mother    Mental illness Father    Mental illness Maternal Grandmother    Mental illness Paternal Grandmother    Allergies  Allergen Reactions   Tylenol [Acetaminophen] Other (See Comments)    Lingering Taste   I? Current Facility-Administered Medications  Medication Dose Route Frequency Provider Last Rate Last Admin   0.9 %  sodium chloride infusion   Intravenous PRN Rubie Maid, MD   Stopped at 03/26/22 1737   0.9 %  sodium chloride infusion   Intravenous Continuous Pabon, Diego F, MD       albumin human 25 % solution 25 g  25 g Intravenous Once Pabon, [place] F, MD       albuterol (PROVENTIL) (2.5 MG/3ML) 0.083% nebulizer solution 2.5 mg  2.5 mg Nebulization Q6H PRN Emeterio Reeve, DO       alum & mag hydroxide-simeth (MAALOX/MYLANTA) 200-200-20 MG/5ML suspension 30 mL  30 mL Oral Q4H PRN Rubie Maid, MD       Ampicillin-Sulbactam (UNASYN) 3 g in sodium chloride 0.9 % 100 mL IVPB  3 g Intravenous Q6H Emeterio Reeve, DO   Stopped at 03/26/22 1725   bisacodyl (DULCOLAX) EC tablet 5 mg  5 mg Oral Daily PRN Rubie Maid, MD       Chlorhexidine Gluconate Cloth 2 % PADS 6 each  6 each Topical Daily Rust-Chester, Britton L, NP   6 each at 03/26/22 1027   diazepam (VALIUM) injection 5 mg  5 mg Intravenous Q6H PRN Rust-Chester, Britton L, NP   5 mg at 03/26/22 1615   docusate sodium (COLACE) capsule 100 mg  100 mg Oral BID Rubie Maid, MD   100 mg at 03/24/22 2207    fluconazole (DIFLUCAN) IVPB 400 mg  400 mg Intravenous Q24H Tyler Pita, MD 100 mL/hr at 03/26/22 1910 Infusion Verify at 03/26/22 1910   heparin injection 5,000 Units  5,000 Units Subcutaneous Q8H Rust-Chester, Britton L, NP   5,000 Units at 03/26/22 1337   HYDROmorphone (DILAUDID) injection 0.5 mg  0.5 mg Intravenous Q4H PRN Emeterio Reeve, DO   0.5 mg at 03/26/22 1826   [START ON 03/27/2022] insulin aspart (novoLOG) injection 0-15 Units  0-15 Units Subcutaneous Q6H Lockie Mola B, RPH       iohexol (OMNIPAQUE) 300 MG/ML solution 450 mL  450 mL Other Once  PRN Jules Husbands, MD       ketorolac (TORADOL) 15 MG/ML injection 15 mg  15 mg Intravenous Q8H PRN Emeterio Reeve, DO       ondansetron [facility]) tablet 4 mg  4 mg Oral Q6H PRN Rubie Maid, MD       Or   ondansetron [facility]) injection 4 mg  4 mg Intravenous Q6H PRN Rubie Maid, MD   4 mg at 03/25/22 0919   pantoprazole (PROTONIX) EC tablet 40 mg  40 mg Oral QHS Benita Gutter, RPH       simethicone (MYLICON) chewable tablet 80 mg  80 mg Oral QID PRN Rubie Maid, MD       sodium chloride flush (NS) 0.9 % injection 10-40 mL  10-40 mL Intracatheter Q12H Emeterio Reeve, DO   20 mL at 03/26/22 1846   sodium chloride flush (NS) 0.9 % injection 10-40 mL  10-40 mL Intracatheter PRN Emeterio Reeve, DO       sodium chloride flush (NS) 0.9 % injection 5 mL  5 mL Intracatheter Q8H El-Abd, Joesph Fillers, MD   5 mL at 03/26/22 1523   TPN ADULT (ION)   Intravenous Continuous TPN Benita Gutter, RPH 37.5 mL/hr at 03/26/22 1910 Infusion Verify at 03/26/22 1910   zolpidem (AMBIEN) tablet 5 mg  5 mg Oral QHS PRN Rubie Maid, MD   5 mg at 03/23/22 2131     Abtx:  Anti-infectives (From admission, onward)    Start     Dose/Rate Route Frequency Ordered Stop   03/25/22 1600  Ampicillin-Sulbactam (UNASYN) 3 g in sodium chloride 0.9 % 100 mL IVPB        3 g 200 mL/hr over 30 Minutes Intravenous Every 6 hours 03/25/22 1134      03/24/22 2200  clindamycin (CLEOCIN) IVPB 900 mg  Status:  Discontinued        900 mg 100 mL/hr over 30 Minutes Intravenous Every 8 hours 03/24/22 1854 03/25/22 1134   03/23/22 1500  fluconazole (DIFLUCAN) IVPB 400 mg        400 mg 100 mL/hr over 120 Minutes Intravenous Every 24 hours 03/23/22 1358     03/22/22 1851  vancomycin variable dose per unstable renal function (pharmacist dosing)  Status:  Discontinued         Does not apply See admin instructions 03/22/22 1851 03/23/22 1051   03/22/22 1645  vancomycin (VANCOCIN) IVPB 1000 mg/200 mL premix  Status:  Discontinued        1,000 mg 200 mL/hr over 60 Minutes Intravenous  Once 03/22/22 1553 03/22/22 1558   03/22/22 1645  vancomycin (VANCOREADY) IVPB 2000 mg/400 mL        2,000 mg 200 mL/hr over 120 Minutes Intravenous  Once 03/22/22 1558 03/22/22 1814   03/22/22 0400  piperacillin-tazobactam (ZOSYN) IVPB 3.375 g  Status:  Discontinued        3.375 g 12.5 mL/hr over 240 Minutes Intravenous Every 8 hours 03/21/22 2047 03/25/22 1134   03/21/22 2200  doxycycline (VIBRAMYCIN) 100 mg in sodium chloride 0.9 % 250 mL IVPB  Status:  Discontinued        100 mg 125 mL/hr over 120 Minutes Intravenous Every 12 hours 03/21/22 2047 03/23/22 1358   03/21/22 1900  piperacillin-tazobactam (ZOSYN) IVPB 3.375 g        3.375 g 12.5 mL/hr over 240 Minutes Intravenous STAT 03/21/22 1853 03/21/22 2300       REVIEW OF SYSTEMS:  Spoke to husband  at bedside Pt is sedated and sleeping on dilaudid   VITALS:  BP 122/76 (BP Location: Right Arm)   Pulse 89   Temp (!) 101.5 F (38.6 C) Comment: Dr. Sheppard Coil notified  Resp (!) 21   Ht '5\' 3"'$  (1.6 m)   Wt 98 kg   LMP 02/25/2022 (Approximate)   SpO2 98%   BMI 38.27 kg/m  LDA NG tube PICC rt arm PHYSICAL EXAM:  General: sleeping Head: Normocephalic, without obvious abnormality, atraumatic. Eyes: unable to examine ENT unable to examine Neck:  symmetrical, no adenopathy, thyroid: non tender no  carotid bruit and no JVD. Lungs:b/l air entry- decreased in the bases Heart: Tachycardia Abdomen: did not examine  Extremities: atraumatic, no cyanosis. No edema. No clubbing Skin: limited  No rashes or lesions. Or bruising Lymph: Cervical, supraclavicular normal. Neurologic:did not assess Pertinent Labs Lab Results CBC    Component Value Date/Time   WBC 10.8 (H) 03/26/2022 0622   RBC 3.82 (L) 03/26/2022 0622   HGB 11.1 (L) 03/26/2022 0622   HGB 15.7 11/22/2020 1119   HCT 33.0 (L) 03/26/2022 0622   HCT 45.1 11/22/2020 1119   PLT 165 03/26/2022 0622   PLT 259 11/22/2020 1119   MCV 86.4 03/26/2022 0622   MCV 87 11/22/2020 1119   MCH 29.1 03/26/2022 0622   MCHC 33.6 03/26/2022 0622   RDW 13.3 03/26/2022 0622   RDW 12.9 11/22/2020 1119   LYMPHSABS 1.0 03/26/2022 0622   LYMPHSABS 2.2 11/22/2020 1119   MONOABS 0.6 03/26/2022 0622   EOSABS 0.1 03/26/2022 0622   EOSABS 0.2 11/22/2020 1119   BASOSABS 0.1 03/26/2022 0622   BASOSABS 0.1 11/22/2020 1119       Latest Ref Rng & Units 03/26/2022    6:22 AM 03/25/2022    3:53 AM 03/24/2022    4:47 AM  CMP  Glucose 70 - 99 mg/dL 72  81  119   BUN 6 - 20 mg/dL '18  14  11   '$ Creatinine 0.44 - 1.00 mg/dL 0.53  0.82  0.68   Sodium 135 - 145 mmol/L 137  135  133   Potassium 3.5 - 5.1 mmol/L 3.7  3.0  3.0   Chloride 98 - 111 mmol/L 102  97  98   CO2 22 - 32 mmol/L '25  30  26   '$ Calcium 8.9 - 10.3 mg/dL 8.0  7.6  7.6   Total Protein 6.5 - 8.1 g/dL  5.1  5.6   Total Bilirubin 0.3 - 1.2 mg/dL  3.4  3.3   Alkaline Phos 38 - 126 U/L  49  62   AST 15 - 41 U/L  18  20   ALT 0 - 44 U/L  14  21       Microbiology: Recent Results (from the past 240 hour(s))  Culture, blood (x 2)     Status: None (Preliminary result)   Collection Time: 03/22/22 10:48 AM   Specimen: BLOOD  Result Value Ref Range Status   Specimen Description BLOOD BLOOD LEFT HAND  Final   Special Requests   Final    BOTTLES DRAWN AEROBIC AND ANAEROBIC Blood Culture  adequate volume   Culture   Final    NO GROWTH 4 DAYS Performed at [facility], [address] [place], [place] [address]    Report Status PENDING  Incomplete  Culture, blood (x 2)     Status: None (Preliminary result)   Collection Time: 03/22/22 11:00 AM   Specimen: BLOOD  Result Value Ref Range Status   Specimen Description BLOOD BLOOD RIGHT HAND  Final   Special Requests AEROBIC BOTTLE ONLY Blood Culture adequate volume  Final   Culture   Final    NO GROWTH 4 DAYS Performed at [facility], [address] [place], [place] [address]    Report Status PENDING  Incomplete  Aerobic/Anaerobic Culture w Gram Stain (surgical/deep wound)     Status: None   Collection Time: 03/22/22 12:57 PM   Specimen: Wound; Abscess  Result Value Ref Range Status   Specimen Description   Final    WOUND Performed at [facility], [address] [place], [place] [address]    Special Requests   Final    ABD ABSCESS Performed at [facility], [address] [place], [place] [address]    Gram Stain   Final    ABUNDANT WBC PRESENT,BOTH PMN AND MONONUCLEAR ABUNDANT GRAM NEGATIVE RODS FEW GRAM POSITIVE RODS FEW GRAM POSITIVE COCCI IN PAIRS FEW BUDDING YEAST SEEN    Culture   Final    ABUNDANT ESCHERICHIA COLI MODERATE CLOSTRIDIUM PERFRINGENS Standardized susceptibility testing for this organism is not available. ABUNDANT BACTEROIDES VULGATUS BETA LACTAMASE NEGATIVE Performed at [facility], [place] [address] [place], [place] [address]    Report Status 03/25/2022 FINAL  Final   Organism ID, Bacteria ESCHERICHIA COLI  Final      Susceptibility   Escherichia coli - MIC*    AMPICILLIN 8 SENSITIVE Sensitive     CEFAZOLIN <=4 SENSITIVE Sensitive     CEFEPIME <=0.12 SENSITIVE Sensitive     CEFTAZIDIME <=1 SENSITIVE Sensitive     CEFTRIAXONE <=0.25 SENSITIVE Sensitive     CIPROFLOXACIN <=0.25 SENSITIVE Sensitive     GENTAMICIN <=1 SENSITIVE Sensitive      IMIPENEM <=0.25 SENSITIVE Sensitive     TRIMETH/SULFA <=20 SENSITIVE Sensitive     AMPICILLIN/SULBACTAM <=2 SENSITIVE Sensitive     PIP/TAZO <=4 SENSITIVE Sensitive     * ABUNDANT ESCHERICHIA COLI  MRSA Next Gen by PCR, Nasal     Status: None   Collection Time: 03/22/22 11:00 PM   Specimen: Nasal Mucosa; Nasal Swab  Result Value Ref Range Status   MRSA by PCR Next Gen NOT DETECTED NOT DETECTED Final    Comment: (NOTE) The GeneXpert MRSA Assay (FDA approved for NASAL specimens only), is one component of a comprehensive MRSA colonization surveillance program. It is not intended to diagnose MRSA infection nor to guide or monitor treatment for MRSA infections. Test performance is not FDA approved in patients less than 21 years old. Performed at [facility], [address] [place], [place] [address]   Urine Culture     Status: None   Collection Time: 03/24/22  6:02 PM   Specimen: Urine, Catheterized  Result Value Ref Range Status   Specimen Description   Final    URINE, CATHETERIZED Performed at [facility], [address] [place], [place] [address]    Special Requests   Final    Normal Performed at [facility], [address] [place], [place] [address]    Culture   Final    NO GROWTH Performed at [facility], [place] [address], [place], [place] [address]    Report Status 03/25/2022 FINAL  Final    IMAGING RESULTS:  I have personally reviewed the films Left rectouterine pouch abscess ?  CXR left lower lobe airspace disease  7/30 gaseous distension of small bowel  Impression/Recommendation  Recent robotic lap for adhesiolysis and left salpingectomy and drainage of the left hydrosalpinx/peritoneal cyst complicated by  Pelvic abscess secondary to sigmoid perforation  IR placed pelvic drain- multiple organisms of colon origin in culture- ecoli, bacteroides, clostridium perfringens and yeast Pt is on appropriate antibiotic with  unasyn and fluconazole Will ask lab to ID the yeast  Fever this evening likely due to contrast enema and extravasation to the pelvic cavity  AKI- resolved  Small bowel ileus- has NG tube Leucocytosis has improved  Septic shock following pelvic drain palcement on 7/28- has improved  Hypoxia /Atelectasis b/l __________________________________________________ Discussed with her husband, requesting provider Note:  This document was prepared using Systems analyst and may include unintentional dictation errors.

## 2022-03-26 NOTE — Progress Notes (Signed)
PROGRESS NOTE    Lacey Harrison  SAY:[id number] DOB: [birth year]  DOA: 03/21/2022 Date of Service: 03/26/22 PCP: Rubie Maid, MD     Brief Narrative / Hospital Course:  36 yo F presenting to [facility] ED from home on 03/21/22 with complaints of sudden onset severe abdominal pain. She recently on 03/18/22 had robotic adhesiolysis, left salpingectomy, drainage of 500 mL from large LEFT peritoneal cyst & chromotubation. She was discharged home on 2/35 without complication. She reported being on the floor with her cat and twisting her body around, with sudden acute abdominal pain. Patient complained of severe pain in lower abdomen radiating towards her vagina.  07/27: CT imaging abscess. Leukocytosis with left shift at 16.8. Initiated on IV Zosyn and Doxycycline. Admitted to OBGYN.  07/28: IR procedure 07/28 CT drain placement into pelvic abscess, left anterior approach, 12 Fr to bulb suction. Patient developed severe sepsis/septic shock after drainage of abdominal pelvic abscess.  She required transfer to stepdown unit, dyspnea worsened with desaturations noted, due to concerns for respiratory status PCCM consulted, receiving BS abx w/ zosyn, doxycycline & vancomycin, volume resuscitation w/ IVF, awaiting culture results   07/29: Remains in SDU w/ ileus and sepsis. IV antibiotics: Continue Zosyn, added fluconazole due to budding yeast noted on abscess fluid, Discontinue doxycycline/vancomycin x1, continue IVF hydration - bicarb infusion, DC other IV fluids due to anasarca. Developing ileus, query feculent appearance on abscess drain, repeat CT abdomen pelvis performed, new loculations on the right, anasarca, distended urinary bladder. No contrast extravasation to suggest bowel perforation 07/30: Continues to have abdominal pain, required NG last night due to recurrent vomiting.  Had 1200 mL of bilious material suctioned from stomach.  NG in place, abdomen less distended. IV antibiotics: Continue Zosyn,  fluconazole, added Clindamycin d/t clostridium prefringens noted on culture.  07/31: repeat CT The abscess is slightly decreased compared to prior exam. Continuing on Unasyn and Diflucan.  08/01: VSS, WBC trending down, BCx NGx4d. Ileus no better. Drain in place, minimal output. NG in place. PICC and TPN initiated. Spoke w/ GYN, plan for surgical consult and ID consult. Pt is in significant pain though lab/VS parameters and imaging show improvement, suspect pain more d/t ileus than to infection.    Consultants:  PCCU OBGYN IR General surgery Infectious disease  Procedures: 07/28 CT drain placement into pelvic abscess 07/30 NG tube placed  07/31 PICC line    Subjective: Patient reports pain is not well controlled, asks about changing medications to something other than morphine, no BM/flatus today, nausea is better. No SOB.  Reports intermittent chest pain which she attributes to distention in the abdomen/gas.     ASSESSMENT & PLAN:   Principal Problem:   Postoperative intra-abdominal abscess Active Problems:   Acute respiratory failure with hypoxia (HCC) in seting of MSKpain, atelectasis, severe sepsis    Severe sepsis (HCC) without septic shock, d/t postoperative abscess, s/p pelvic drain placement    Ileus, postoperative (HCC)   AKI (acute kidney injury) ([place]) in setting of sepsis, IV contrast, NAGMA - RESOLVED    Abdominal pain   Severe sepsis ([place]) without septic shock, d/t postoperative abscess, s/p pelvic drain placement  Budding yeast EColi and Clostridium --> IV abx: Unasyn and Diflucan Abscess shows some improvement on CT today 07/30 --> repeat imaging prn worsening  Monitor CBC and VS for sepsis parameters --> improving  Acute respiratory failure with hypoxia (HCC) in seting of MSKpain, atelectasis, severe sepsis  Supplemental O2 prn goal >90% bronchodilators  Incentive  spirometry   AKI (acute kidney injury) ([place]) in setting of sepsis, IV contrast, NAGMA -  RESOLVED  Trend BMP I&O close monitor UOP  Ileus, postoperative ([place]) NG in place PICC and TPN today 03/26/22  Po diet as tolerated  General surgery consulted  Postoperative intra-abdominal abscess GYN following ID and general surgery consulted today  Abdominal pain Suspect related more to ileus since infection parameters are improving Discussed would like to avoid opiates if possible as this will not improve ileus Trial switching morphine to Dilaudid low-dose today Patient encouraged out of bed/walking     DVT prophylaxis: heparin Code Status: FULL Family Communication: family at bedside on rounds  Disposition Plan / TOC needs: none at this time, PT once more stable  Barriers to discharge / significant pending items: pending improvement in ileus, pain management, may need repeat CT              Objective: Vitals:   03/26/22 0400 03/26/22 0700 03/26/22 0800 03/26/22 0900  BP: 108/81 111/72 107/77 101/78  Pulse: 81 85 93 81  Resp: 18     Temp: 98.6 F (37 C)   99.6 F (37.6 C)  TempSrc: Oral   Oral  SpO2: 96% 97% 96% 96%  Weight:      Height:        Intake/Output Summary (Last 24 hours) at 03/26/2022 1315 Last data filed at 03/26/2022 5170 Gross per 24 hour  Intake --  Output 1725 ml  Net -1725 ml   Filed Weights   03/21/22 1722  Weight: 91 kg    Examination:  Constitutional:  VS as above General Appearance: alert, well-developed, well-nourished, NAD Eyes: Normal lids and conjunctive, non-icteric sclera Ears, Nose, Mouth, Throat: NG in place  Neck: No masses, trachea midline Respiratory: Normal respiratory effort Breath sounds normal, no wheeze/rhonchi/rales Cardiovascular: S1/S2 normal, no murmur/rub/gallop auscultated No lower extremity edema Gastrointestinal: Mild TTP diffusely no rebound/guarding  Musculoskeletal:  No clubbing/cyanosis of digits Neurological: No cranial nerve deficit on limited exam Psychiatric: Normal  judgment/insight Normal mood and affect       Scheduled Medications:   Chlorhexidine Gluconate Cloth  6 each Topical Daily   docusate sodium  100 mg Oral BID   heparin injection (subcutaneous)  5,000 Units Subcutaneous Q8H   [START ON 03/27/2022] insulin aspart  0-15 Units Subcutaneous Q6H   pantoprazole  40 mg Oral QHS   sodium chloride flush  5 mL Intracatheter Q8H    Continuous Infusions:  sodium chloride Stopped (03/22/22 1328)   ampicillin-sulbactam (UNASYN) IV 3 g (03/26/22 1026)   fluconazole (DIFLUCAN) IV 400 mg (03/25/22 1744)   TPN ADULT (ION)      PRN Medications:  sodium chloride, albuterol, alum & mag hydroxide-simeth, bisacodyl, diazepam, HYDROmorphone (DILAUDID) injection, ketorolac, ondansetron **OR** ondansetron (ZOFRAN) IV, simethicone, zolpidem  Antimicrobials:  Anti-infectives (From admission, onward)    Start     Dose/Rate Route Frequency Ordered Stop   03/25/22 1600  Ampicillin-Sulbactam (UNASYN) 3 g in sodium chloride 0.9 % 100 mL IVPB        3 g 200 mL/hr over 30 Minutes Intravenous Every 6 hours 03/25/22 1134     03/24/22 2200  clindamycin (CLEOCIN) IVPB 900 mg  Status:  Discontinued        900 mg 100 mL/hr over 30 Minutes Intravenous Every 8 hours 03/24/22 1854 03/25/22 1134   03/23/22 1500  fluconazole (DIFLUCAN) IVPB 400 mg        400 mg 100 mL/hr over 120  Minutes Intravenous Every 24 hours 03/23/22 1358     03/22/22 1851  vancomycin variable dose per unstable renal function (pharmacist dosing)  Status:  Discontinued         Does not apply See admin instructions 03/22/22 1851 03/23/22 1051   03/22/22 1645  vancomycin (VANCOCIN) IVPB 1000 mg/200 mL premix  Status:  Discontinued        1,000 mg 200 mL/hr over 60 Minutes Intravenous  Once 03/22/22 1553 03/22/22 1558   03/22/22 1645  vancomycin (VANCOREADY) IVPB 2000 mg/400 mL        2,000 mg 200 mL/hr over 120 Minutes Intravenous  Once 03/22/22 1558 03/22/22 1814   03/22/22 0400   piperacillin-tazobactam (ZOSYN) IVPB 3.375 g  Status:  Discontinued        3.375 g 12.5 mL/hr over 240 Minutes Intravenous Every 8 hours 03/21/22 2047 03/25/22 1134   03/21/22 2200  doxycycline (VIBRAMYCIN) 100 mg in sodium chloride 0.9 % 250 mL IVPB  Status:  Discontinued        100 mg 125 mL/hr over 120 Minutes Intravenous Every 12 hours 03/21/22 2047 03/23/22 1358   03/21/22 1900  piperacillin-tazobactam (ZOSYN) IVPB 3.375 g        3.375 g 12.5 mL/hr over 240 Minutes Intravenous STAT 03/21/22 1853 03/21/22 2300       Data Reviewed: I have personally reviewed following labs and imaging studies  CBC: Recent Labs  Lab 03/21/22 1744 03/22/22 0543 03/22/22 1048 03/23/22 0530 03/24/22 0447 03/25/22 0353 03/26/22 0622  WBC 16.8* 9.9 11.5* 9.0 13.3* 13.0* 10.8*  NEUTROABS 14.5* 8.3* 10.0*  --   --   --  7.5  HGB 15.3* 14.7 13.8 13.6 12.3 10.8* 11.1*  HCT 44.5 43.1 40.2 39.9 36.4 32.3* 33.0*  MCV 84.3 85.2 84.6 85.4 84.3 86.1 86.4  PLT 294 290 260 199 213 189 767   Basic Metabolic Panel: Recent Labs  Lab 03/22/22 2242 03/23/22 0431 03/24/22 0447 03/25/22 0353 03/26/22 0622  NA 136 136 133* 135 137  K 4.1 3.7 3.0* 3.0* 3.7  CL 108 108 98 97* 102  CO2 19* 19* '26 30 25  '$ GLUCOSE 109* 111* 119* 81 72  BUN '19 16 11 14 18  '$ CREATININE 0.89 0.82 0.68 0.82 0.53  CALCIUM 7.5* 7.2* 7.6* 7.6* 8.0*  MG 1.4* 2.6* 2.6* 2.4 2.3  PHOS 3.6 3.8 1.7* 2.2* 4.8*   GFR: Estimated Creatinine Clearance: 105.1 mL/min (by C-G formula based on SCr of 0.53 mg/dL). Liver Function Tests: Recent Labs  Lab 03/21/22 1744 03/22/22 0924 03/23/22 0431 03/24/22 0447 03/25/22 0353  AST '31 27 26 20 18  '$ ALT 49* 34 '23 21 14  '$ ALKPHOS 57 35* 41 62 49  BILITOT 2.0* 4.9* 4.0* 3.3* 3.4*  PROT 7.8 6.2* 5.5* 5.6* 5.1*  ALBUMIN 4.3 3.3* 2.6* 2.4* 2.1*   Recent Labs  Lab 03/21/22 1744  LIPASE 26   No results for input(s): "AMMONIA" in the last 168 hours. Coagulation Profile: Recent Labs  Lab  03/22/22 1048  INR 1.3*   Cardiac Enzymes: No results for input(s): "CKTOTAL", "CKMB", "CKMBINDEX", "TROPONINI" in the last 168 hours. BNP (last 3 results) No results for input(s): "PROBNP" in the last 8760 hours. HbA1C: No results for input(s): "HGBA1C" in the last 72 hours. CBG: Recent Labs  Lab 03/22/22 2223  GLUCAP 95   Lipid Profile: No results for input(s): "CHOL", "HDL", "LDLCALC", "TRIG", "CHOLHDL", "LDLDIRECT" in the last 72 hours. Thyroid Function Tests: No results for input(s): "TSH", "  T4TOTAL", "FREET4", "T3FREE", "THYROIDAB" in the last 72 hours. Anemia Panel: No results for input(s): "VITAMINB12", "FOLATE", "FERRITIN", "TIBC", "IRON", "RETICCTPCT" in the last 72 hours. Urine analysis:    Component Value Date/Time   COLORURINE RED (A) 03/24/2022 1802   APPEARANCEUR TURBID (A) 03/24/2022 1802   LABSPEC 1.045 (H) 03/24/2022 1802   PHURINE  03/24/2022 1802    TEST NOT REPORTED DUE TO COLOR INTERFERENCE OF URINE PIGMENT   GLUCOSEU (A) 03/24/2022 1802    TEST NOT REPORTED DUE TO COLOR INTERFERENCE OF URINE PIGMENT   HGBUR NEGATIVE 03/24/2022 1802   BILIRUBINUR (A) 03/24/2022 1802    TEST NOT REPORTED DUE TO COLOR INTERFERENCE OF URINE PIGMENT   KETONESUR (A) 03/24/2022 1802    TEST NOT REPORTED DUE TO COLOR INTERFERENCE OF URINE PIGMENT   PROTEINUR (A) 03/24/2022 1802    TEST NOT REPORTED DUE TO COLOR INTERFERENCE OF URINE PIGMENT   NITRITE (A) 03/24/2022 1802    TEST NOT REPORTED DUE TO COLOR INTERFERENCE OF URINE PIGMENT   LEUKOCYTESUR (A) 03/24/2022 1802    TEST NOT REPORTED DUE TO COLOR INTERFERENCE OF URINE PIGMENT   Sepsis Labs: '@LABRCNTIP'$ (procalcitonin:4,lacticidven:4)  Recent Results (from the past 240 hour(s))  Culture, blood (x 2)     Status: None (Preliminary result)   Collection Time: 03/22/22 10:48 AM   Specimen: BLOOD  Result Value Ref Range Status   Specimen Description BLOOD BLOOD LEFT HAND  Final   Special Requests   Final    BOTTLES  DRAWN AEROBIC AND ANAEROBIC Blood Culture adequate volume   Culture   Final    NO GROWTH 4 DAYS Performed at [facility], [address] [place], [place] [address]    Report Status PENDING  Incomplete  Culture, blood (x 2)     Status: None (Preliminary result)   Collection Time: 03/22/22 11:00 AM   Specimen: BLOOD  Result Value Ref Range Status   Specimen Description BLOOD BLOOD RIGHT HAND  Final   Special Requests AEROBIC BOTTLE ONLY Blood Culture adequate volume  Final   Culture   Final    NO GROWTH 4 DAYS Performed at [facility], [address] [place], [place] [address]    Report Status PENDING  Incomplete  Aerobic/Anaerobic Culture w Gram Stain (surgical/deep wound)     Status: None   Collection Time: 03/22/22 12:57 PM   Specimen: Wound; Abscess  Result Value Ref Range Status   Specimen Description   Final    WOUND Performed at [facility], [address] [place], [place] [address]    Special Requests   Final    ABD ABSCESS Performed at [facility], [address] [place], [place] [address]    Gram Stain   Final    ABUNDANT WBC PRESENT,BOTH PMN AND MONONUCLEAR ABUNDANT GRAM NEGATIVE RODS FEW GRAM POSITIVE RODS FEW GRAM POSITIVE COCCI IN PAIRS FEW BUDDING YEAST SEEN    Culture   Final    ABUNDANT ESCHERICHIA COLI MODERATE CLOSTRIDIUM PERFRINGENS Standardized susceptibility testing for this organism is not available. ABUNDANT BACTEROIDES VULGATUS BETA LACTAMASE NEGATIVE Performed at [facility], [place] [address] [place],  [address]    Report Status 03/25/2022 FINAL  Final   Organism ID, Bacteria ESCHERICHIA COLI  Final      Susceptibility   Escherichia coli - MIC*    AMPICILLIN 8 SENSITIVE Sensitive     CEFAZOLIN <=4 SENSITIVE Sensitive     CEFEPIME <=0.12 SENSITIVE Sensitive  CEFTAZIDIME <=1 SENSITIVE Sensitive     CEFTRIAXONE <=0.25 SENSITIVE Sensitive     CIPROFLOXACIN <=0.25 SENSITIVE  Sensitive     GENTAMICIN <=1 SENSITIVE Sensitive     IMIPENEM <=0.25 SENSITIVE Sensitive     TRIMETH/SULFA <=20 SENSITIVE Sensitive     AMPICILLIN/SULBACTAM <=2 SENSITIVE Sensitive     PIP/TAZO <=4 SENSITIVE Sensitive     * ABUNDANT ESCHERICHIA COLI  MRSA Next Gen by PCR, Nasal     Status: None   Collection Time: 03/22/22 11:00 PM   Specimen: Nasal Mucosa; Nasal Swab  Result Value Ref Range Status   MRSA by PCR Next Gen NOT DETECTED NOT DETECTED Final    Comment: (NOTE) The GeneXpert MRSA Assay (FDA approved for NASAL specimens only), is one component of a comprehensive MRSA colonization surveillance program. It is not intended to diagnose MRSA infection nor to guide or monitor treatment for MRSA infections. Test performance is not FDA approved in patients less than 23 years old. Performed at [facility], [address], [place], [place] [address]   Urine Culture     Status: None   Collection Time: 03/24/22  6:02 PM   Specimen: Urine, Catheterized  Result Value Ref Range Status   Specimen Description   Final    URINE, CATHETERIZED Performed at [facility], [address], [place], [place] [address]    Special Requests   Final    Normal Performed at [facility], [address] [place], [place] [address]    Culture   Final    NO GROWTH Performed at [facility], [place] [address], [place], [place] [address]    Report Status 03/25/2022 FINAL  Final         Radiology Studies last 96 hours: [place] EKG SITE RITE  Result Date: 03/26/2022 If Site Rite image not attached, placement could not be confirmed due to current cardiac rhythm.  CT ABDOMEN PELVIS W CONTRAST  Result Date: 03/25/2022 CLINICAL DATA:  Acute abdominal pain status post surgery. EXAM: CT ABDOMEN AND PELVIS WITH CONTRAST TECHNIQUE: Multidetector CT imaging of the abdomen and pelvis was performed using the standard protocol following bolus administration of intravenous contrast.  RADIATION DOSE REDUCTION: This exam was performed according to the departmental dose-optimization program which includes automated exposure control, adjustment of the mA and/or kV according to patient size and/or use of iterative reconstruction technique. CONTRAST:  153m OMNIPAQUE IOHEXOL 300 MG/ML  SOLN COMPARISON:  February 21, 2022. FINDINGS: Lower chest: Mild bilateral posterior basilar subsegmental atelectasis is noted. Hepatobiliary: Hepatic steatosis. No gallstones or biliary dilatation is noted. Pancreas: Unremarkable. No pancreatic ductal dilatation or surrounding inflammatory changes. Spleen: Normal in size without focal abnormality. Adrenals/Urinary Tract: Adrenal glands and kidneys appear normal. No hydronephrosis or renal obstruction is noted. Urinary bladder is decompressed secondary to Foley catheter. Stomach/Bowel: Nasogastric tube tip is seen with tip in proximal stomach. The stomach is otherwise unremarkable. The appendix appears normal. Mild proximal small bowel dilatation is noted which is improved slightly compared to prior exam. Vascular/Lymphatic: No significant vascular findings are present. No enlarged abdominal or pelvic lymph nodes. Reproductive: Status post left salpingectomy. Right adnexal region and uterus are unremarkable. Other: Continued presence of percutaneous drainage catheter seen in left-sided pelvic abscess. The abscess currently measures 8.6 x 3.3 cm which is slightly decreased compared to prior exam. Mild ascites is noted in the pelvis and right side of the abdomen. No hernia is noted. Mild amount of pneumoperitoneum remains consistent with recent  postoperative status. Musculoskeletal: No acute or significant osseous findings. IMPRESSION: Continued presence of percutaneous drainage catheter is seen in left-sided abdominal abscess. The abscess currently measures 8.6 x 3.3 cm which is slightly decreased compared to prior exam. Mild proximal small bowel dilatation is noted which is  slightly improved compared to prior exam, most consistent with ileus. Mild amount of pneumoperitoneum is noted consistent with recent postoperative status. Stable mild ascites is noted in the pelvis and right side of the abdomen. Hepatic steatosis. Urinary bladder is decompressed secondary to Foley catheter. Mild bilateral posterior basilar subsegmental atelectasis is noted. Electronically Signed   By: Marijo Conception M.D.   On: 03/25/2022 13:05   DG Abd 1 View  Result Date: 03/24/2022 CLINICAL DATA:  NG tube placement EXAM: ABDOMEN - 1 VIEW COMPARISON:  Chest radiograph 03/22/2022 FINDINGS: Enteric tube tip is coiled in the left upper quadrant consistent with location in the body of the stomach. Shallow inspiration with atelectasis in the lung bases. Gas-filled distended upper abdominal small bowel may indicate obstruction. IMPRESSION: Enteric tube tip projects over the body of the stomach. Atelectasis in the lung bases. Gaseous distention of small bowel. Electronically Signed   By: Lucienne Capers M.D.   On: 03/24/2022 01:33   CT ABDOMEN PELVIS W CONTRAST  Result Date: 03/23/2022 CLINICAL DATA:  History of robotic salpingectomy on 03/18/2022. Patient is now status post CT-guided placement of a drainage catheter into abdominopelvic abscess on 03/22/2022. Patient is having severe upper abdominal pain. EXAM: CT CHEST, ABDOMEN AND PELVIS WITHOUT CONTRAST TECHNIQUE: Multidetector CT imaging of the chest, abdomen and pelvis was performed following the standard protocol without IV contrast. RADIATION DOSE REDUCTION: This exam was performed according to the departmental dose-optimization program which includes automated exposure control, adjustment of the mA and/or kV according to patient size and/or use of iterative reconstruction technique. COMPARISON:  None Available. CT abdomen pelvis 03/21/2022. FINDINGS: CT CHEST FINDINGS Cardiovascular: No significant vascular findings. Normal heart size. No pericardial  effusion. Mediastinum/Nodes: No enlarged mediastinal or axillary lymph nodes. Hilar lymph nodes are not well visualized on the unenhanced exam. Thyroid gland, trachea, and esophagus demonstrate no significant findings. Lungs/Pleura: Segmental atelectasis in the medial lower lobes. Additional non segmental atelectasis in the right upper lobe. No pleural effusion or pneumothorax. Musculoskeletal: No chest wall mass or suspicious bone lesions identified. CT ABDOMEN PELVIS FINDINGS Hepatobiliary: Diffuse decreased attenuation of the liver. No focal liver abnormality is seen. Increased bile density within the gallbladder, likely reflecting vicarious contrast excretion. No gallstones, gallbladder wall thickening, or biliary dilatation. Pancreas: Unremarkable. No pancreatic ductal dilatation or surrounding inflammatory changes. Spleen: Normal in size without focal abnormality. Adrenals/Urinary Tract: Adrenal glands are unremarkable. Kidneys are normal, without renal calculi, focal lesion, or hydronephrosis. The urinary bladder is markedly distended but otherwise unremarkable. Stomach/Bowel: The stomach is moderately distended. Multiple dilated proximal and mid small bowel loops measuring up to 3.9 cm in diameter with air-fluid levels (series 7, image 31) the distal ileum is decompressed. There is a gradual tapering of the small bowel caliber without distinct transition point. Submucosal fat deposition throughout the ascending colon, suggestive of sequela of chronic inflammation. The colon is decompressed with mild bowel wall thickening of the sigmoid colon adjacent to the abdominopelvic abscess. Vascular/Lymphatic: No significant vascular findings are present. No enlarged abdominal or pelvic lymph nodes. Reproductive: Uterus and right adnexa are unremarkable. Other: Interval placement of a pigtail drainage catheter within the loculated gas containing fluid collection centered at the left rectouterine pouch.  The fluid  collection is mildly decreased in size compared to 03/21/2022, measuring 12.8 cm craniocaudal dimension x 4.8 cm AP dimension on sagittal plane compared to 13 cm x 6.4 cm on previous study when measured in a similar fashion (series 6, image 78). Since 03/21/2022, there is increased abdominopelvic ascites, particularly within the right mid and lower abdomen. A fluid collection in the right lower contains a few small foci of air but does not have a thin rim of enhancement to suggest a loculated fluid collection (series 7, image 60). Small amount of non dependent free air is again noted within the upper abdomen, consistent with recent postoperative status. Musculoskeletal: No acute or significant osseous findings. IMPRESSION: CT chest: 1. Segmental atelectasis in the lower lobes and additional subsegmental atelectasis in the right upper lobe. No pleural effusion. CT abdomen pelvis: Interval 1. Mild interval decreased size of the abdominopelvic abscess centered in the left rectouterine pouch status post pigtail drainage catheter placement. 2. New abdominopelvic ascites, particularly within the right abdomen, without discrete loculated drainable fluid collection. 3. New dilatation of multiple small bowel loops with air-fluid levels and moderate distention of the gas and fluid-filled stomach. No distinct transition point. Findings are favored to represent adynamic ileus secondary to inflammatory changes in the left pelvis. 4. Diffuse mild bowel wall thickening of the sigmoid colon secondary to adjacent inflammatory changes related to abdominopelvic abscess. 5. Small volume pneumoperitoneum, consistent with recent postoperative status. 6. Hepatic steatosis. Electronically Signed   By: Ileana Roup M.D.   On: 03/23/2022 15:47   CT CHEST WO CONTRAST  Result Date: 03/23/2022 CLINICAL DATA:  History of robotic salpingectomy on 03/18/2022. Patient is now status post CT-guided placement of a drainage catheter into  abdominopelvic abscess on 03/22/2022. Patient is having severe upper abdominal pain. EXAM: CT CHEST, ABDOMEN AND PELVIS WITHOUT CONTRAST TECHNIQUE: Multidetector CT imaging of the chest, abdomen and pelvis was performed following the standard protocol without IV contrast. RADIATION DOSE REDUCTION: This exam was performed according to the departmental dose-optimization program which includes automated exposure control, adjustment of the mA and/or kV according to patient size and/or use of iterative reconstruction technique. COMPARISON:  None Available. CT abdomen pelvis 03/21/2022. FINDINGS: CT CHEST FINDINGS Cardiovascular: No significant vascular findings. Normal heart size. No pericardial effusion. Mediastinum/Nodes: No enlarged mediastinal or axillary lymph nodes. Hilar lymph nodes are not well visualized on the unenhanced exam. Thyroid gland, trachea, and esophagus demonstrate no significant findings. Lungs/Pleura: Segmental atelectasis in the medial lower lobes. Additional non segmental atelectasis in the right upper lobe. No pleural effusion or pneumothorax. Musculoskeletal: No chest wall mass or suspicious bone lesions identified. CT ABDOMEN PELVIS FINDINGS Hepatobiliary: Diffuse decreased attenuation of the liver. No focal liver abnormality is seen. Increased bile density within the gallbladder, likely reflecting vicarious contrast excretion. No gallstones, gallbladder wall thickening, or biliary dilatation. Pancreas: Unremarkable. No pancreatic ductal dilatation or surrounding inflammatory changes. Spleen: Normal in size without focal abnormality. Adrenals/Urinary Tract: Adrenal glands are unremarkable. Kidneys are normal, without renal calculi, focal lesion, or hydronephrosis. The urinary bladder is markedly distended but otherwise unremarkable. Stomach/Bowel: The stomach is moderately distended. Multiple dilated proximal and mid small bowel loops measuring up to 3.9 cm in diameter with air-fluid levels  (series 7, image 31) the distal ileum is decompressed. There is a gradual tapering of the small bowel caliber without distinct transition point. Submucosal fat deposition throughout the ascending colon, suggestive of sequela of chronic inflammation. The colon is decompressed with mild bowel wall thickening  of the sigmoid colon adjacent to the abdominopelvic abscess. Vascular/Lymphatic: No significant vascular findings are present. No enlarged abdominal or pelvic lymph nodes. Reproductive: Uterus and right adnexa are unremarkable. Other: Interval placement of a pigtail drainage catheter within the loculated gas containing fluid collection centered at the left rectouterine pouch. The fluid collection is mildly decreased in size compared to 03/21/2022, measuring 12.8 cm craniocaudal dimension x 4.8 cm AP dimension on sagittal plane compared to 13 cm x 6.4 cm on previous study when measured in a similar fashion (series 6, image 78). Since 03/21/2022, there is increased abdominopelvic ascites, particularly within the right mid and lower abdomen. A fluid collection in the right lower contains a few small foci of air but does not have a thin rim of enhancement to suggest a loculated fluid collection (series 7, image 60). Small amount of non dependent free air is again noted within the upper abdomen, consistent with recent postoperative status. Musculoskeletal: No acute or significant osseous findings. IMPRESSION: CT chest: 1. Segmental atelectasis in the lower lobes and additional subsegmental atelectasis in the right upper lobe. No pleural effusion. CT abdomen pelvis: Interval 1. Mild interval decreased size of the abdominopelvic abscess centered in the left rectouterine pouch status post pigtail drainage catheter placement. 2. New abdominopelvic ascites, particularly within the right abdomen, without discrete loculated drainable fluid collection. 3. New dilatation of multiple small bowel loops with air-fluid levels and  moderate distention of the gas and fluid-filled stomach. No distinct transition point. Findings are favored to represent adynamic ileus secondary to inflammatory changes in the left pelvis. 4. Diffuse mild bowel wall thickening of the sigmoid colon secondary to adjacent inflammatory changes related to abdominopelvic abscess. 5. Small volume pneumoperitoneum, consistent with recent postoperative status. 6. Hepatic steatosis. Electronically Signed   By: Ileana Roup M.D.   On: 03/23/2022 15:47   DG Chest 2 View  Result Date: 03/22/2022 CLINICAL DATA:  Shortness of breath. EXAM: CHEST - 2 VIEW COMPARISON:  Two-view chest x-ray 07/26/2016.  CT chest 03/21/2022 FINDINGS: Low lung volumes exaggerate the heart size. Left lower lobe airspace opacities are again noted. No other significant airspace disease is present. IMPRESSION: Persistent left lower lobe airspace disease concerning for pneumonia. Electronically Signed   By: San Morelle M.D.   On: 03/22/2022 21:46   CT IMAGE GUIDED DRAINAGE PERCUT CATH  PERITONEAL RETROPERIT  Result Date: 03/22/2022 INDICATION: Abdominopelvic abscess EXAM: CT-guided placement of drainage catheter into abdominopelvic abscess TECHNIQUE: Multidetector CT imaging of the abdomen and pelvis was performed following the standard protocol without IV contrast. RADIATION DOSE REDUCTION: This exam was performed according to the departmental dose-optimization program which includes automated exposure control, adjustment of the mA and/or kV according to patient size and/or use of iterative reconstruction technique. MEDICATIONS: Per EMR ANESTHESIA/SEDATION: Local analgesia COMPLICATIONS: None immediate. PROCEDURE: Informed written consent was obtained from the patient after a thorough discussion of the procedural risks, benefits and alternatives. All questions were addressed. Maximal Sterile Barrier Technique was utilized including caps, mask, sterile gowns, sterile gloves, sterile drape,  hand hygiene and skin antiseptic. A timeout was performed prior to the initiation of the procedure. The patient was placed supine on the exam table. Limited CT of the abdomen and pelvis was performed for planning purposes. This again demonstrated an air in fluid collection in the lower abdomen and pelvis. Skin entry site was marked with a planned anterolateral approach from the left lower quadrant. The overlying skin was prepped and draped in a standard sterile fashion.  Local analgesia was obtained with 1% lidocaine. Using intermittent CT fluoroscopy, an 18 gauge trocar needle was advanced towards the identified abdominopelvic fluid collection. Access was confirmed with CT and return of purulent material. An 035 wire was then advanced through the access needle, over which the percutaneous tract was serially dilated to accommodate a 12 [place] multipurpose locking drainage catheter. Location was again confirmed with CT and return of additional purulent material. Catheter was secured to the skin using silk suture and a dressing. It was attached to bulb suction. The patient tolerated the procedure well without immediate complication. IMPRESSION: Successful CT-guided placement of a 12 French locking drainage catheter into the lower abdominopelvic abscess. Sample sent to the lab for microbiology analysis. Electronically Signed   By: Albin Felling M.D.   On: 03/22/2022 15:12            LOS: 5 days      Emeterio Reeve, DO [facility] 03/26/2022, 1:15 PM   Staff may message me via secure chat in [place]  but this may not receive immediate response,  please page for urgent matters!  If 7PM-7AM, please contact night-coverage [email or web address]  Dictation software was used to generate the above note. Typos may occur and escape review, as with typed/written notes. Please contact Dr Sheppard Coil directly for clarity if needed.

## 2022-03-26 NOTE — Progress Notes (Signed)
Referring Physician(s): Rubie Maid, MD Dr. Dahlia Byes  Supervising Physician: Dr. Kathlene Cote  Patient Status:  [facility] - In-pt  Reason for visit: Abdominal pain s/p recent robotic assisted salpingectomy, LOA 7/24  S/p successful 69F lower abdominopelvic percutaneous abscess drain placed 7/28  Subjective: Repeat CT yesterday showed some improved but still considerable abscess cavity remaining, drain in good position.   Allergies: Tylenol [acetaminophen]  Medications:  Current Facility-Administered Medications:    0.9 %  sodium chloride infusion, , Intravenous, PRN, Rubie Maid, MD, Stopped at 03/22/22 1328   albuterol (PROVENTIL) (2.5 MG/3ML) 0.083% nebulizer solution 2.5 mg, 2.5 mg, Nebulization, Q6H PRN, Emeterio Reeve, DO   alum & mag hydroxide-simeth (MAALOX/MYLANTA) 200-200-20 MG/5ML suspension 30 mL, 30 mL, Oral, Q4H PRN, Rubie Maid, MD   Ampicillin-Sulbactam (UNASYN) 3 g in sodium chloride 0.9 % 100 mL IVPB, 3 g, Intravenous, Q6H, Emeterio Reeve, DO, Last Rate: 200 mL/hr at 03/26/22 1026, 3 g at 03/26/22 1026   bisacodyl (DULCOLAX) EC tablet 5 mg, 5 mg, Oral, Daily PRN, Rubie Maid, MD   Chlorhexidine Gluconate Cloth 2 % PADS 6 each, 6 each, Topical, Daily, Rust-Chester, Huel Cote, NP, 6 each at 03/26/22 1027   diazepam (VALIUM) injection 5 mg, 5 mg, Intravenous, Q6H PRN, Rust-Chester, Toribio Harbour L, NP, 5 mg at 03/23/22 2343   docusate sodium (COLACE) capsule 100 mg, 100 mg, Oral, BID, Rubie Maid, MD, 100 mg at 03/24/22 2207   fluconazole (DIFLUCAN) IVPB 400 mg, 400 mg, Intravenous, Q24H, Tyler Pita, MD, Last Rate: 100 mL/hr at 03/25/22 1744, 400 mg at 03/25/22 1744   heparin injection 5,000 Units, 5,000 Units, Subcutaneous, Q8H, Rust-Chester, Britton L, NP, 5,000 Units at 03/26/22 1337   HYDROmorphone (DILAUDID) injection 0.5 mg, 0.5 mg, Intravenous, Q4H PRN, Emeterio Reeve, DO, 0.5 mg at 03/26/22 1339   [START ON 03/27/2022] insulin aspart (novoLOG)  injection 0-15 Units, 0-15 Units, Subcutaneous, Q6H, Benita Gutter, RPH   ketorolac (TORADOL) 15 MG/ML injection 15 mg, 15 mg, Intravenous, Q6H PRN, Tyler Pita, MD, 15 mg at 03/26/22 0804   ondansetron (ZOFRAN) tablet 4 mg, 4 mg, Oral, Q6H PRN **OR** ondansetron (ZOFRAN) injection 4 mg, 4 mg, Intravenous, Q6H PRN, Rubie Maid, MD, 4 mg at 03/25/22 0919   pantoprazole (PROTONIX) EC tablet 40 mg, 40 mg, Oral, QHS, Chappell, Alex B, RPH   simethicone (MYLICON) chewable tablet 80 mg, 80 mg, Oral, QID PRN, Rubie Maid, MD   sodium chloride flush (NS) 0.9 % injection 10-40 mL, 10-40 mL, Intracatheter, Q12H, Emeterio Reeve, DO   sodium chloride flush (NS) 0.9 % injection 10-40 mL, 10-40 mL, Intracatheter, PRN, Emeterio Reeve, DO   sodium chloride flush (NS) 0.9 % injection 5 mL, 5 mL, Intracatheter, Q8H, El-Abd, Joesph Fillers, MD, 5 mL at 03/26/22 1523   TPN ADULT (ION), , Intravenous, Continuous TPN, Benita Gutter, RPH   zolpidem (AMBIEN) tablet 5 mg, 5 mg, Oral, QHS PRN, Rubie Maid, MD, 5 mg at 03/23/22 2131  Vital Signs: BP 101/78   Pulse 81   Temp 99.6 F (37.6 C) (Oral)   Resp 18   Ht '5\' 3"'$  (1.6 m)   Wt 91 kg   LMP 02/25/2022 (Approximate)   SpO2 96%   BMI 35.54 kg/m   Physical Exam  General: Awake, appears in pain, NGT in place Abd: TTP, Abdominopelvic drain dressing C/D Output cloudy purulent, foul-smelling. Able to flush easily and then aspirate about 30 mL of turbid beige purulence.  Output by Drain (mL) 03/24/22  0701 - 03/24/22 1900 03/24/22 1901 - 03/25/22 0700 03/25/22 0701 - 03/25/22 1900 03/25/22 1901 - 03/26/22 0700 03/26/22 0701 - 03/26/22 1622  Closed System Drain Left Abdomen Bulb (JP) 12 Fr. 30 15      Imaging: [place] EKG SITE RITE  Result Date: 03/26/2022 If Site Rite image not attached, placement could not be confirmed due to current cardiac rhythm.  CT ABDOMEN PELVIS W CONTRAST  Result Date: 03/25/2022 CLINICAL DATA:  Acute abdominal pain  status post surgery. EXAM: CT ABDOMEN AND PELVIS WITH CONTRAST TECHNIQUE: Multidetector CT imaging of the abdomen and pelvis was performed using the standard protocol following bolus administration of intravenous contrast. RADIATION DOSE REDUCTION: This exam was performed according to the departmental dose-optimization program which includes automated exposure control, adjustment of the mA and/or kV according to patient size and/or use of iterative reconstruction technique. CONTRAST:  117m OMNIPAQUE IOHEXOL 300 MG/ML  SOLN COMPARISON:  February 21, 2022. FINDINGS: Lower chest: Mild bilateral posterior basilar subsegmental atelectasis is noted. Hepatobiliary: Hepatic steatosis. No gallstones or biliary dilatation is noted. Pancreas: Unremarkable. No pancreatic ductal dilatation or surrounding inflammatory changes. Spleen: Normal in size without focal abnormality. Adrenals/Urinary Tract: Adrenal glands and kidneys appear normal. No hydronephrosis or renal obstruction is noted. Urinary bladder is decompressed secondary to Foley catheter. Stomach/Bowel: Nasogastric tube tip is seen with tip in proximal stomach. The stomach is otherwise unremarkable. The appendix appears normal. Mild proximal small bowel dilatation is noted which is improved slightly compared to prior exam. Vascular/Lymphatic: No significant vascular findings are present. No enlarged abdominal or pelvic lymph nodes. Reproductive: Status post left salpingectomy. Right adnexal region and uterus are unremarkable. Other: Continued presence of percutaneous drainage catheter seen in left-sided pelvic abscess. The abscess currently measures 8.6 x 3.3 cm which is slightly decreased compared to prior exam. Mild ascites is noted in the pelvis and right side of the abdomen. No hernia is noted. Mild amount of pneumoperitoneum remains consistent with recent postoperative status. Musculoskeletal: No acute or significant osseous findings. IMPRESSION: Continued presence of  percutaneous drainage catheter is seen in left-sided abdominal abscess. The abscess currently measures 8.6 x 3.3 cm which is slightly decreased compared to prior exam. Mild proximal small bowel dilatation is noted which is slightly improved compared to prior exam, most consistent with ileus. Mild amount of pneumoperitoneum is noted consistent with recent postoperative status. Stable mild ascites is noted in the pelvis and right side of the abdomen. Hepatic steatosis. Urinary bladder is decompressed secondary to Foley catheter. Mild bilateral posterior basilar subsegmental atelectasis is noted. Electronically Signed   By: JMarijo ConceptionM.D.   On: 03/25/2022 13:05   DG Abd 1 View  Result Date: 03/24/2022 CLINICAL DATA:  NG tube placement EXAM: ABDOMEN - 1 VIEW COMPARISON:  Chest radiograph 03/22/2022 FINDINGS: Enteric tube tip is coiled in the left upper quadrant consistent with location in the body of the stomach. Shallow inspiration with atelectasis in the lung bases. Gas-filled distended upper abdominal small bowel may indicate obstruction. IMPRESSION: Enteric tube tip projects over the body of the stomach. Atelectasis in the lung bases. Gaseous distention of small bowel. Electronically Signed   By: WLucienne CapersM.D.   On: 03/24/2022 01:33   CT ABDOMEN PELVIS W CONTRAST  Result Date: 03/23/2022 CLINICAL DATA:  History of robotic salpingectomy on 03/18/2022. Patient is now status post CT-guided placement of a drainage catheter into abdominopelvic abscess on 03/22/2022. Patient is having severe upper abdominal pain. EXAM: CT CHEST, ABDOMEN AND PELVIS  WITHOUT CONTRAST TECHNIQUE: Multidetector CT imaging of the chest, abdomen and pelvis was performed following the standard protocol without IV contrast. RADIATION DOSE REDUCTION: This exam was performed according to the departmental dose-optimization program which includes automated exposure control, adjustment of the mA and/or kV according to patient size  and/or use of iterative reconstruction technique. COMPARISON:  None Available. CT abdomen pelvis 03/21/2022. FINDINGS: CT CHEST FINDINGS Cardiovascular: No significant vascular findings. Normal heart size. No pericardial effusion. Mediastinum/Nodes: No enlarged mediastinal or axillary lymph nodes. Hilar lymph nodes are not well visualized on the unenhanced exam. Thyroid gland, trachea, and esophagus demonstrate no significant findings. Lungs/Pleura: Segmental atelectasis in the medial lower lobes. Additional non segmental atelectasis in the right upper lobe. No pleural effusion or pneumothorax. Musculoskeletal: No chest wall mass or suspicious bone lesions identified. CT ABDOMEN PELVIS FINDINGS Hepatobiliary: Diffuse decreased attenuation of the liver. No focal liver abnormality is seen. Increased bile density within the gallbladder, likely reflecting vicarious contrast excretion. No gallstones, gallbladder wall thickening, or biliary dilatation. Pancreas: Unremarkable. No pancreatic ductal dilatation or surrounding inflammatory changes. Spleen: Normal in size without focal abnormality. Adrenals/Urinary Tract: Adrenal glands are unremarkable. Kidneys are normal, without renal calculi, focal lesion, or hydronephrosis. The urinary bladder is markedly distended but otherwise unremarkable. Stomach/Bowel: The stomach is moderately distended. Multiple dilated proximal and mid small bowel loops measuring up to 3.9 cm in diameter with air-fluid levels (series 7, image 31) the distal ileum is decompressed. There is a gradual tapering of the small bowel caliber without distinct transition point. Submucosal fat deposition throughout the ascending colon, suggestive of sequela of chronic inflammation. The colon is decompressed with mild bowel wall thickening of the sigmoid colon adjacent to the abdominopelvic abscess. Vascular/Lymphatic: No significant vascular findings are present. No enlarged abdominal or pelvic lymph nodes.  Reproductive: Uterus and right adnexa are unremarkable. Other: Interval placement of a pigtail drainage catheter within the loculated gas containing fluid collection centered at the left rectouterine pouch. The fluid collection is mildly decreased in size compared to 03/21/2022, measuring 12.8 cm craniocaudal dimension x 4.8 cm AP dimension on sagittal plane compared to 13 cm x 6.4 cm on previous study when measured in a similar fashion (series 6, image 78). Since 03/21/2022, there is increased abdominopelvic ascites, particularly within the right mid and lower abdomen. A fluid collection in the right lower contains a few small foci of air but does not have a thin rim of enhancement to suggest a loculated fluid collection (series 7, image 60). Small amount of non dependent free air is again noted within the upper abdomen, consistent with recent postoperative status. Musculoskeletal: No acute or significant osseous findings. IMPRESSION: CT chest: 1. Segmental atelectasis in the lower lobes and additional subsegmental atelectasis in the right upper lobe. No pleural effusion. CT abdomen pelvis: Interval 1. Mild interval decreased size of the abdominopelvic abscess centered in the left rectouterine pouch status post pigtail drainage catheter placement. 2. New abdominopelvic ascites, particularly within the right abdomen, without discrete loculated drainable fluid collection. 3. New dilatation of multiple small bowel loops with air-fluid levels and moderate distention of the gas and fluid-filled stomach. No distinct transition point. Findings are favored to represent adynamic ileus secondary to inflammatory changes in the left pelvis. 4. Diffuse mild bowel wall thickening of the sigmoid colon secondary to adjacent inflammatory changes related to abdominopelvic abscess. 5. Small volume pneumoperitoneum, consistent with recent postoperative status. 6. Hepatic steatosis. Electronically Signed   By: Ileana Roup M.D.   On:  03/23/2022 15:47   CT CHEST WO CONTRAST  Result Date: 03/23/2022 CLINICAL DATA:  History of robotic salpingectomy on 03/18/2022. Patient is now status post CT-guided placement of a drainage catheter into abdominopelvic abscess on 03/22/2022. Patient is having severe upper abdominal pain. EXAM: CT CHEST, ABDOMEN AND PELVIS WITHOUT CONTRAST TECHNIQUE: Multidetector CT imaging of the chest, abdomen and pelvis was performed following the standard protocol without IV contrast. RADIATION DOSE REDUCTION: This exam was performed according to the departmental dose-optimization program which includes automated exposure control, adjustment of the mA and/or kV according to patient size and/or use of iterative reconstruction technique. COMPARISON:  None Available. CT abdomen pelvis 03/21/2022. FINDINGS: CT CHEST FINDINGS Cardiovascular: No significant vascular findings. Normal heart size. No pericardial effusion. Mediastinum/Nodes: No enlarged mediastinal or axillary lymph nodes. Hilar lymph nodes are not well visualized on the unenhanced exam. Thyroid gland, trachea, and esophagus demonstrate no significant findings. Lungs/Pleura: Segmental atelectasis in the medial lower lobes. Additional non segmental atelectasis in the right upper lobe. No pleural effusion or pneumothorax. Musculoskeletal: No chest wall mass or suspicious bone lesions identified. CT ABDOMEN PELVIS FINDINGS Hepatobiliary: Diffuse decreased attenuation of the liver. No focal liver abnormality is seen. Increased bile density within the gallbladder, likely reflecting vicarious contrast excretion. No gallstones, gallbladder wall thickening, or biliary dilatation. Pancreas: Unremarkable. No pancreatic ductal dilatation or surrounding inflammatory changes. Spleen: Normal in size without focal abnormality. Adrenals/Urinary Tract: Adrenal glands are unremarkable. Kidneys are normal, without renal calculi, focal lesion, or hydronephrosis. The urinary bladder is  markedly distended but otherwise unremarkable. Stomach/Bowel: The stomach is moderately distended. Multiple dilated proximal and mid small bowel loops measuring up to 3.9 cm in diameter with air-fluid levels (series 7, image 31) the distal ileum is decompressed. There is a gradual tapering of the small bowel caliber without distinct transition point. Submucosal fat deposition throughout the ascending colon, suggestive of sequela of chronic inflammation. The colon is decompressed with mild bowel wall thickening of the sigmoid colon adjacent to the abdominopelvic abscess. Vascular/Lymphatic: No significant vascular findings are present. No enlarged abdominal or pelvic lymph nodes. Reproductive: Uterus and right adnexa are unremarkable. Other: Interval placement of a pigtail drainage catheter within the loculated gas containing fluid collection centered at the left rectouterine pouch. The fluid collection is mildly decreased in size compared to 03/21/2022, measuring 12.8 cm craniocaudal dimension x 4.8 cm AP dimension on sagittal plane compared to 13 cm x 6.4 cm on previous study when measured in a similar fashion (series 6, image 78). Since 03/21/2022, there is increased abdominopelvic ascites, particularly within the right mid and lower abdomen. A fluid collection in the right lower contains a few small foci of air but does not have a thin rim of enhancement to suggest a loculated fluid collection (series 7, image 60). Small amount of non dependent free air is again noted within the upper abdomen, consistent with recent postoperative status. Musculoskeletal: No acute or significant osseous findings. IMPRESSION: CT chest: 1. Segmental atelectasis in the lower lobes and additional subsegmental atelectasis in the right upper lobe. No pleural effusion. CT abdomen pelvis: Interval 1. Mild interval decreased size of the abdominopelvic abscess centered in the left rectouterine pouch status post pigtail drainage catheter  placement. 2. New abdominopelvic ascites, particularly within the right abdomen, without discrete loculated drainable fluid collection. 3. New dilatation of multiple small bowel loops with air-fluid levels and moderate distention of the gas and fluid-filled stomach. No distinct transition point. Findings are favored to represent adynamic ileus secondary  to inflammatory changes in the left pelvis. 4. Diffuse mild bowel wall thickening of the sigmoid colon secondary to adjacent inflammatory changes related to abdominopelvic abscess. 5. Small volume pneumoperitoneum, consistent with recent postoperative status. 6. Hepatic steatosis. Electronically Signed   By: Ileana Roup M.D.   On: 03/23/2022 15:47   DG Chest 2 View  Result Date: 03/22/2022 CLINICAL DATA:  Shortness of breath. EXAM: CHEST - 2 VIEW COMPARISON:  Two-view chest x-ray 07/26/2016.  CT chest 03/21/2022 FINDINGS: Low lung volumes exaggerate the heart size. Left lower lobe airspace opacities are again noted. No other significant airspace disease is present. IMPRESSION: Persistent left lower lobe airspace disease concerning for pneumonia. Electronically Signed   By: San Morelle M.D.   On: 03/22/2022 21:46    Labs:  CBC: Recent Labs    03/23/22 0530 03/24/22 0447 03/25/22 0353 03/26/22 0622  WBC 9.0 13.3* 13.0* 10.8*  HGB 13.6 12.3 10.8* 11.1*  HCT 39.9 36.4 32.3* 33.0*  PLT 199 213 189 165     COAGS: Recent Labs    03/22/22 1048  INR 1.3*  APTT 34     BMP: Recent Labs    03/23/22 0431 03/24/22 0447 03/25/22 0353 03/26/22 0622  NA 136 133* 135 137  K 3.7 3.0* 3.0* 3.7  CL 108 98 97* 102  CO2 19* '26 30 25  '$ GLUCOSE 111* 119* 81 72  BUN '16 11 14 18  '$ CALCIUM 7.2* 7.6* 7.6* 8.0*  CREATININE 0.82 0.68 0.82 0.53  GFRNONAA >60 >60 >60 >60     LIVER FUNCTION TESTS: Recent Labs    03/22/22 0924 03/23/22 0431 03/24/22 0447 03/25/22 0353  BILITOT 4.9* 4.0* 3.3* 3.4*  AST '27 26 20 18  '$ ALT 34 '23 21 14   '$ ALKPHOS 35* 41 62 49  PROT 6.2* 5.5* 5.6* 5.1*  ALBUMIN 3.3* 2.6* 2.4* 2.1*     Assessment and Plan: This is a 36 year old female known to our service with significant PMHx of recent robotic assisted left salpingectomy, LOA and drainage of left peritoneal cyst 7/24 s/p successful 62F lower abdominopelvic percutaneous abscess drain placed 7/28. Cx + E.Coli and + Clostridium perfringens, wbc trend is still elevated  Output remains turbid beige.   Barium enema performed today shows evidence of contrast extrav from sigmoid colon. Exact location difficult to visualize but contrast remains pooled in abscess cavity following study.  If patient is clinically improving, afebrile and catheter drainage remains <10-15 cc/48 hrs (excluding flush volumes) and is non-feculent and non-purulent, consider catheter removal pending discussion with the providing surgical service and potential drain injection.  If patient has worsening clinical symptoms, persistently febrile or has increased or consistently high drainage output, it may be necessary to repeat CT imaging to evaluate for residual abscess or fistula formation.   Drainage catheter has retained suture within catheter, prior to removal of drain cut off the hub to release the retention suture forming the pigtail.  Plan: Continue saline flushes with 5-10 cc NS 3x daily Record output Q shift. Dressing changes QD or PRN if soiled.  Secure chat Epic message IR APP or on call IR MD if difficulty flushing or sudden change in drain output.  Repeat imaging/possible drain injection once output < 10 mL/QD (excluding flush material.)  Discharge planning: Please secure chat epic message IR APP or on call IR MD prior to patient d/c to ensure appropriate follow up plans are in place. Typically patient will follow up with IR clinic 10-14 days post  d/c for repeat imaging/possible drain injection. IR scheduler will contact patient with date/time of appointment.  Patient will need to flush drain QD with 5 cc NS, record output QD, dressing changes every 2-3 days or earlier if soiled.   IR will continue to follow - please call with questions or concerns.   Electronically Signed: Ascencion Dike, PA-C 03/26/2022, 4:22 PM   I spent a total of 15 Minutes at the the patient's bedside AND on the patient's hospital floor or unit, greater than 50% of which was counseling/coordinating care for abdominal abscess.

## 2022-03-26 NOTE — Consult Note (Signed)
[facility] SURGICAL CONSULTATION NOTE (initial) - cpt: [id number]   HISTORY OF PRESENT ILLNESS (HPI):  36 y.o. female who is initially s/p robotic assisted laparoscopic left salpingectomy, lysis of adhesions, and drainage of left peritoneal cyst (500 ccs) with Dr Marcelline Mates on 07/24. She presented to the ED on 07/27 secondary to acute onset of lower abdominal pain. This acutely onset while on the floor playing with her cat. It was constant. No reported fever, chills, nausea, emesis, SOB, or CP. Work up in the ED revealed a leukocytosis to 16.8K, lactic acidosis to 3.4, BCx are negative to date, and further laboratory work up was reassuring. She underwent CT Abdomen/Pelvis which showed an 8 x 9 x 7.5 cm abscess in the retrouterine pouch. She was admitted to the GYN service initially and started on IV Zosyn and Doxycycline. Interventional radiology was consulted and she underwent percutaneous drain placement on 07/28. Cx from this grew abundant pan-sensitive E coli and moderate Clostridium perfringens. Vancomycin added on 07/28. Transferred to ICU on 07/28 as well given concerns over change in respiratory status. CTA Chest did not reveal an embolus. NGT placed on 07/29 secondary to distension and emesis. She underwent repeat CT Abdomen/Pelvis on 07/31 which showed slight decrease in size of pelvic collection and improved bowel dilation. Most recent labs show an improving WBC at 10.8K, renal function normal with sCr - 0.53, no significant electrolyte derangements aside from mild hyperphosphatemia at 4.8. Plan to start TPN given prolonged ileus. She is currently on Unasyn and diflucan.   Surgery is consulted by hospitalist physician Dr. Emeterio Reeve, DO in this context for evaluation and management of ileus.   PAST MEDICAL HISTORY (PMH):  Past Medical History:  Diagnosis Date   Allergy    Anxiety    Chicken pox    Depression    Heart murmur    Lyme disease    patient reports a remote  history of lyme and alludes that she has "chronic lyme"   Migraines    MIGRAINES   Pneumonia 07/2017     PAST SURGICAL HISTORY [facility]):  Past Surgical History:  Procedure Laterality Date   CHROMOPERTUBATION  02/16/2018   Procedure: CHROMOPERTUBATION;  Surgeon: Schermerhorn, Gwen Her, MD;  Location: [facility]S;  Service: Gynecology;;   CHROMOPERTUBATION Left 03/18/2022   Procedure: CHROMOPERTUBATION;  Surgeon: Rubie Maid, MD;  Location: [facility] OR[facility];  Service: Gynecology;  Laterality: Left;   CYSTECTOMY Left    OVARY   DILATION AND CURETTAGE OF UTERUS  2008   LAPAROSCOPIC LYSIS OF ADHESIONS N/A 02/16/2018   Procedure: EXTENSIVE LAPAROSCOPIC LYSIS OF ADHESIONS;  Surgeon: Schermerhorn, Gwen Her, MD;  Location: [facility] ORS;  Service: Gynecology;  Laterality: N/A;   LAPAROSCOPY N/A 02/16/2018   Procedure: LAPAROSCOPY OPERATIVE;  Surgeon: Schermerhorn, Gwen Her, MD;  Location: [facility] OR[facility];  Service: Gynecology;  Laterality: N/A;   LYSIS OF ADHESION N/A 03/18/2022   Procedure: LYSIS OF ADHESION;  Surgeon: Rubie Maid, MD;  Location: [facility] OR[facility];  Service: Gynecology;  Laterality: N/A;   XI ROBOTIC ASSISTED SALPINGECTOMY Left 03/18/2022   Procedure: XI ROBOTIC ASSISTED SALPINGECTOMY;  Surgeon: Rubie Maid, MD;  Location: [facility] OR[facility];  Service: Gynecology;  Laterality: Left;     MEDICATIONS:  Prior to Admission medications   Medication Sig Start Date End Date Taking? Authorizing Provider  ibuprofen (ADVIL) 800 MG tablet Take 1 tablet (800 mg total) by mouth every 8 (eight) hours as needed. 03/18/22  Yes Rubie Maid, MD  oxyCODONE (ROXICODONE) 5 MG immediate release tablet  Take 1 tablet (5 mg total) by mouth every 8 (eight) hours as needed. 03/18/22 03/18/23 Yes Rubie Maid, MD  simethicone (GAS-X) 80 MG chewable tablet Chew 1 tablet (80 mg total) by mouth 4 (four) times daily as needed for flatulence. 03/18/22 03/18/23 Yes Rubie Maid, MD  Vitamin D, Ergocalciferol, (DRISDOL) 1.25 MG (50000 UNIT) CAPS capsule  Take 50,000 Units by mouth every 7 (seven) days.   Yes [provider]  albuterol (VENTOLIN HFA) 108 (90 Base) MCG/ACT inhaler Inhale 1-2 puffs into the lungs every 6 (six) hours as needed for wheezing or shortness of breath. 09/25/21   Hazel Sams, PA-C     ALLERGIES:  Allergies  Allergen Reactions   Tylenol [Acetaminophen] Other (See Comments)    Lingering Taste     SOCIAL HISTORY:  Social History   Socioeconomic History   Marital status: Married    Spouse name: Ryan   Number of children: Not on file   Years of education: Not on file   Highest education level: Not on file  Occupational History   Not on file  Tobacco Use   Smoking status: Some Days    Packs/day: 0.75    Years: 14.00    Total pack years: 10.50    Types: Cigarettes    Last attempt to quit: 07/05/2016    Years since quitting: 5.7   Smokeless tobacco: Never  Vaping Use   Vaping Use: Former  Substance and Sexual Activity   Alcohol use: Not Currently    Alcohol/week: 11.0 standard drinks of alcohol    Types: 3 Glasses of wine, 2 Cans of beer, 3 Shots of liquor, 3 Standard drinks or equivalent per week    Comment: almost daily   Drug use: No   Sexual activity: Yes    Birth control/protection: None  Other Topics Concern   Not on file  Social History Narrative   Not on file   Social Determinants of Health   Financial Resource Strain: Not on file  Food Insecurity: Not on file  Transportation Needs: Not on file  Physical Activity: Not on file  Stress: Not on file  Social Connections: Not on file  Intimate Partner Violence: Not on file     FAMILY HISTORY:  Family History  Problem Relation Age of Onset   Mental illness Mother    Mental illness Father    Mental illness Maternal Grandmother    Mental illness Paternal Grandmother       REVIEW OF SYSTEMS:  Review of Systems  Constitutional:  Negative for chills and fever.  HENT:  Negative for congestion and sore throat.    Respiratory:  Negative for shortness of breath.   Cardiovascular:  Negative for chest pain and palpitations.  Gastrointestinal:  Positive for abdominal pain. Negative for diarrhea, nausea and vomiting.  Genitourinary:  Negative for dysuria and urgency.  All other systems reviewed and are negative.   VITAL SIGNS:  Temp:  [98.6 F (37 C)-99.6 F (37.6 C)] 99.6 F (37.6 C) (08/01 0900) Pulse Rate:  [81-100] 81 (08/01 0900) Resp:  [16-20] 18 (08/01 0400) BP: (93-111)/(64-81) 101/78 (08/01 0900) SpO2:  [94 %-97 %] 96 % (08/01 0900)     Height: '5\' 3"'$  (160 cm) Weight: 91 kg BMI (Calculated): 35.55   INTAKE/OUTPUT:  07/31 0701 - 08/01 0700 In: 0  Out: 1200 [Urine:1100; Emesis/NG output:100]  PHYSICAL EXAM:  Physical Exam Vitals and nursing note reviewed. Exam conducted with a chaperone present.  Constitutional:  General: She is not in acute distress.    Appearance: She is well-developed. She is obese. She is not ill-appearing.  HENT:     Head: Normocephalic and atraumatic.     Comments: NGT in place Eyes:     General: No scleral icterus.    Extraocular Movements: Extraocular movements intact.  Cardiovascular:     Rate and Rhythm: Normal rate.     Heart sounds: Normal heart sounds. No murmur heard. Pulmonary:     Effort: Pulmonary effort is normal. No respiratory distress.  Abdominal:     General: Abdomen is protuberant. A surgical scar is present. There is no distension.     Palpations: Abdomen is soft.     Tenderness: There is abdominal tenderness in the right lower quadrant. There is no guarding or rebound.     Comments: Abdomen is obese, soft, she seems most tender to the RLQ and flank, distension difficult to assess given habitus but not overtly tympanic. She is not peritonitic. Percutaneous drain in the LLQ with seropurulent vs thin feculent appearing fluid. Laparoscopic incisions are healing well. There is also edema to the right lateral abdominal wall.    Genitourinary:    Comments: Deferred Musculoskeletal:     Right lower leg: Edema (Trace) present.     Left lower leg: Edema (Trace) present.  Skin:    General: Skin is warm and dry.     Coloration: Skin is not jaundiced or pale.  Neurological:     General: No focal deficit present.     Mental Status: She is alert and oriented to person, place, and time.  Psychiatric:        Mood and Affect: Mood normal.        Behavior: Behavior normal.      Labs:     Latest Ref Rng & Units 03/26/2022    6:22 AM 03/25/2022    3:53 AM 03/24/2022    4:47 AM  CBC  WBC 4.0 - 10.5 K/uL 10.8  13.0  13.3   Hemoglobin 12.0 - 15.0 g/dL 11.1  10.8  12.3   Hematocrit 36.0 - 46.0 % 33.0  32.3  36.4   Platelets 150 - 400 K/uL 165  189  213       Latest Ref Rng & Units 03/26/2022    6:22 AM 03/25/2022    3:53 AM 03/24/2022    4:47 AM  CMP  Glucose 70 - 99 mg/dL 72  81  119   BUN 6 - 20 mg/dL '18  14  11   '$ Creatinine 0.44 - 1.00 mg/dL 0.53  0.82  0.68   Sodium 135 - 145 mmol/L 137  135  133   Potassium 3.5 - 5.1 mmol/L 3.7  3.0  3.0   Chloride 98 - 111 mmol/L 102  97  98   CO2 22 - 32 mmol/L '25  30  26   '$ Calcium 8.9 - 10.3 mg/dL 8.0  7.6  7.6   Total Protein 6.5 - 8.1 g/dL  5.1  5.6   Total Bilirubin 0.3 - 1.2 mg/dL  3.4  3.3   Alkaline Phos 38 - 126 U/L  49  62   AST 15 - 41 U/L  18  20   ALT 0 - 44 U/L  14  21      Imaging studies:   CT Abdomen/Pelvis (03/21/2022) personally reviewed which shows air fluid collection in the pelvis concerning for abscess, pneumoperitoneum in setting of recent surgery, and radiologist  report reviewed below:  IMPRESSION: 1. An 8 x 9 x 7.5cm gas and fluid collection centered within the left rectouterine pouch likely represents a developing abscess. No findings to suggest bowel injury; however, this is not fully excluded. If a CT is repeated in the near future, consider use of IV and PO contrast. 2. Small volume pneumoperitoneum likely postsurgical in etiology. 3.  Hepatomegaly and hepatic steatosis.   CT Chest/Abdomen/Pelvis (03/23/2022) personally reviewed and agree with radiologist report which is reviewed below:  IMPRESSION: CT chest:   1. Segmental atelectasis in the lower lobes and additional subsegmental atelectasis in the right upper lobe. No pleural effusion.   CT abdomen pelvis: Interval   1. Mild interval decreased size of the abdominopelvic abscess centered in the left rectouterine pouch status post pigtail drainage catheter placement. 2. New abdominopelvic ascites, particularly within the right abdomen, without discrete loculated drainable fluid collection. 3. New dilatation of multiple small bowel loops with air-fluid levels and moderate distention of the gas and fluid-filled stomach. No distinct transition point. Findings are favored to represent adynamic ileus secondary to inflammatory changes in the left pelvis. 4. Diffuse mild bowel wall thickening of the sigmoid colon secondary to adjacent inflammatory changes related to abdominopelvic abscess. 5. Small volume pneumoperitoneum, consistent with recent postoperative status. 6. Hepatic steatosis.   CT Abdomen/Pelvis (03/25/2022) personally reviewed which decompressed stomach and relatively decompressed small bowel, non-specific ascites, and drain now present in pelvic abscess, and radiologist report reviewed below:  IMPRESSION: Continued presence of percutaneous drainage catheter is seen in left-sided abdominal abscess. The abscess currently measures 8.6 x 3.3 cm which is slightly decreased compared to prior exam.  Mild proximal small bowel dilatation is noted which is slightly improved compared to prior exam, most consistent with ileus.   Mild amount of pneumoperitoneum is noted consistent with recent postoperative status.   Stable mild ascites is noted in the pelvis and right side of the abdomen.   Hepatic steatosis.   Urinary bladder is decompressed secondary to  Foley catheter.   Mild bilateral posterior basilar subsegmental atelectasis is noted.    Assessment/Plan: (ICD-10's: K52.7) 36 y.o. female with likely ileus 8 days s/p robotic assisted laparoscopic left salpingectomy, lysis of adhesion, and drainage of 500 ccs peritoneal cyst complicated by retrouterine abscess s/p percutaneous drainage on 07/28   - Agree with management of likely ileus. Continue NGT decompression, PICC, TPN - Monitor abdominal examination; on-going bowel function - Consider serial KUBs   - Continue percutaneous drain; monitor and record output; flush per IR recommendations - Continue IV Abx (Unasyn) + IV Antifungals (diflucan); Cx reviewed   - No emergent indication for general surgery interventions   - ? Role for lasix given concern for edema  - Monitor leukocytosis; improved  - Further management per primary service; we will follow   All of the above findings and recommendations were discussed with the patient and her family, and all of their questions were answered to their expressed satisfaction.  Thank you for the opportunity to participate in this patient's care.   -- [name], PA-C [facility] 03/26/2022, 12:44 PM M-F: 7am - 4pm

## 2022-03-27 ENCOUNTER — Inpatient Hospital Stay: Payer: No Typology Code available for payment source | Admitting: Anesthesiology

## 2022-03-27 ENCOUNTER — Encounter: Payer: Self-pay | Admitting: Obstetrics and Gynecology

## 2022-03-27 ENCOUNTER — Inpatient Hospital Stay: Payer: No Typology Code available for payment source

## 2022-03-27 ENCOUNTER — Encounter
Admission: EM | Disposition: A | Discharge status: Home / Self Care | Payer: Self-pay | Source: Home / Self Care | Attending: Osteopathic Medicine

## 2022-03-27 ENCOUNTER — Other Ambulatory Visit: Payer: Self-pay

## 2022-03-27 DIAGNOSIS — T8143XA Infection following a procedure, organ and space surgical site, initial encounter: Secondary | ICD-10-CM | POA: Diagnosis not present

## 2022-03-27 DIAGNOSIS — K631 Perforation of intestine (nontraumatic): Secondary | ICD-10-CM | POA: Diagnosis not present

## 2022-03-27 HISTORY — PX: LAPAROTOMY: SHX154

## 2022-03-27 HISTORY — PX: COLECTOMY WITH COLOSTOMY CREATION/HARTMANN PROCEDURE: SHX6598

## 2022-03-27 LAB — CULTURE, BLOOD (ROUTINE X 2)
Culture: NO GROWTH
Culture: NO GROWTH
Special Requests: ADEQUATE
Special Requests: ADEQUATE

## 2022-03-27 LAB — TRIGLYCERIDES: Triglycerides: 283 mg/dL — ABNORMAL HIGH (ref <150)

## 2022-03-27 LAB — COMPREHENSIVE METABOLIC PANEL
ALT: 17 U/L (ref 0–44)
AST: 34 U/L (ref 15–41)
Albumin: 2.2 g/dL — ABNORMAL LOW (ref 3.5–5.0)
Alkaline Phosphatase: 56 U/L (ref 38–126)
Anion gap: 5 (ref 5–15)
BUN: 12 mg/dL (ref 6–20)
CO2: 30 mmol/L (ref 22–32)
Calcium: 7.8 mg/dL — ABNORMAL LOW (ref 8.9–10.3)
Chloride: 105 mmol/L (ref 98–111)
Creatinine, Ser: 0.51 mg/dL (ref 0.44–1.00)
GFR, Estimated: >60 mL/min (ref >60)
Glucose, Bld: 126 mg/dL — ABNORMAL HIGH (ref 70–99)
Potassium: 2.9 mmol/L — ABNORMAL LOW (ref 3.5–5.1)
Sodium: 140 mmol/L (ref 135–145)
Total Bilirubin: 4.5 mg/dL — ABNORMAL HIGH (ref 0.3–1.2)
Total Protein: 5.3 g/dL — ABNORMAL LOW (ref 6.5–8.1)

## 2022-03-27 LAB — CBC
HCT: 32.2 % — ABNORMAL LOW (ref 36.0–46.0)
Hemoglobin: 10.5 g/dL — ABNORMAL LOW (ref 12.0–15.0)
MCH: 28.5 pg (ref 26.0–34.0)
MCHC: 32.6 g/dL (ref 30.0–36.0)
MCV: 87.3 fL (ref 80.0–100.0)
Platelets: 161 10*3/uL (ref 150–400)
RBC: 3.69 MIL/uL — ABNORMAL LOW (ref 3.87–5.11)
RDW: 13.6 % (ref 11.5–15.5)
WBC: 17.5 10*3/uL — ABNORMAL HIGH (ref 4.0–10.5)
nRBC: 0.4 % — ABNORMAL HIGH (ref 0.0–0.2)

## 2022-03-27 LAB — GLUCOSE, CAPILLARY
Glucose-Capillary: 124 mg/dL — ABNORMAL HIGH (ref 70–99)
Glucose-Capillary: 135 mg/dL — ABNORMAL HIGH (ref 70–99)
Glucose-Capillary: 215 mg/dL — ABNORMAL HIGH (ref 70–99)
Glucose-Capillary: 228 mg/dL — ABNORMAL HIGH (ref 70–99)

## 2022-03-27 LAB — MAGNESIUM: Magnesium: 1.9 mg/dL (ref 1.7–2.4)

## 2022-03-27 LAB — PHOSPHORUS: Phosphorus: 3.2 mg/dL (ref 2.5–4.6)

## 2022-03-27 LAB — POTASSIUM: Potassium: 3.5 mmol/L (ref 3.5–5.1)

## 2022-03-27 SURGERY — LAPAROTOMY, EXPLORATORY
Anesthesia: General | Site: Abdomen

## 2022-03-27 MED ORDER — SUGAMMADEX SODIUM 200 MG/2ML IV SOLN
INTRAVENOUS | Status: DC | PRN
  Administered 2022-03-27: 200 mg via INTRAVENOUS

## 2022-03-27 MED ORDER — MIDAZOLAM HCL 2 MG/2ML IJ SOLN
INTRAMUSCULAR | Status: AC
  Filled 2022-03-27: qty 2

## 2022-03-27 MED ORDER — ONDANSETRON HCL 4 MG/2ML IJ SOLN
INTRAMUSCULAR | Status: DC | PRN
  Administered 2022-03-27: 4 mg via INTRAVENOUS

## 2022-03-27 MED ORDER — SODIUM CHLORIDE (PF) 0.9 % IJ SOLN
INTRAMUSCULAR | Status: AC
  Filled 2022-03-27: qty 50

## 2022-03-27 MED ORDER — TRACE MINERALS CU-MN-SE-ZN 300-55-60-3000 MCG/ML IV SOLN
INTRAVENOUS | Status: AC
  Filled 2022-03-27: qty 720

## 2022-03-27 MED ORDER — BUPIVACAINE LIPOSOME 1.3 % IJ SUSP
INTRAMUSCULAR | Status: AC
  Filled 2022-03-27: qty 20

## 2022-03-27 MED ORDER — ROCURONIUM BROMIDE 100 MG/10ML IV SOLN
INTRAVENOUS | Status: DC | PRN
  Administered 2022-03-27: 60 mg via INTRAVENOUS
  Administered 2022-03-27: 10 mg via INTRAVENOUS
  Administered 2022-03-27 (×3): 20 mg via INTRAVENOUS

## 2022-03-27 MED ORDER — POTASSIUM CHLORIDE 20 MEQ PO PACK: 40.0000 meq | PACK | Freq: Once | ORAL | Status: DC

## 2022-03-27 MED ORDER — BUPIVACAINE-EPINEPHRINE (PF) 0.25% -1:200000 IJ SOLN
INTRAMUSCULAR | Status: AC
  Filled 2022-03-27: qty 30

## 2022-03-27 MED ORDER — POTASSIUM CHLORIDE 10 MEQ/100ML IV SOLN
10.0000 meq | INTRAVENOUS | Status: AC
  Administered 2022-03-27 (×4): 10 meq via INTRAVENOUS
  Filled 2022-03-27 (×4): qty 100

## 2022-03-27 MED ORDER — PROPOFOL 10 MG/ML IV BOLUS
INTRAVENOUS | Status: DC | PRN
  Administered 2022-03-27: 100 mg via INTRAVENOUS
  Administered 2022-03-27: 30 mg via INTRAVENOUS

## 2022-03-27 MED ORDER — FENTANYL CITRATE (PF) 100 MCG/2ML IJ SOLN
INTRAMUSCULAR | Status: AC
  Administered 2022-03-27: 50 ug
  Filled 2022-03-27: qty 2

## 2022-03-27 MED ORDER — 0.9 % SODIUM CHLORIDE (POUR BTL) OPTIME
TOPICAL | Status: DC | PRN
  Administered 2022-03-27: 3000 mL
  Administered 2022-03-27: 4000 mL

## 2022-03-27 MED ORDER — KETOROLAC TROMETHAMINE 30 MG/ML IJ SOLN
INTRAMUSCULAR | Status: DC | PRN
  Administered 2022-03-27: 30 mg via INTRAVENOUS

## 2022-03-27 MED ORDER — SUCCINYLCHOLINE CHLORIDE 200 MG/10ML IV SOSY
PREFILLED_SYRINGE | INTRAVENOUS | Status: AC
  Filled 2022-03-27: qty 10

## 2022-03-27 MED ORDER — ALBUMIN HUMAN 5 % IV SOLN
INTRAVENOUS | Status: AC
  Filled 2022-03-27: qty 250

## 2022-03-27 MED ORDER — EPHEDRINE 5 MG/ML INJ
INTRAVENOUS | Status: AC
  Filled 2022-03-27: qty 5

## 2022-03-27 MED ORDER — ROCURONIUM BROMIDE 10 MG/ML (PF) SYRINGE
PREFILLED_SYRINGE | INTRAVENOUS | Status: AC
  Filled 2022-03-27: qty 10

## 2022-03-27 MED ORDER — ALBUMIN HUMAN 5 % IV SOLN: INTRAVENOUS | Status: DC | PRN

## 2022-03-27 MED ORDER — PHENYLEPHRINE HCL (PRESSORS) 10 MG/ML IV SOLN
INTRAVENOUS | Status: DC | PRN
  Administered 2022-03-27 (×2): 160 ug via INTRAVENOUS
  Administered 2022-03-27 (×2): 80 ug via INTRAVENOUS
  Administered 2022-03-27: 160 ug via INTRAVENOUS

## 2022-03-27 MED ORDER — POTASSIUM CHLORIDE 10 MEQ/100ML IV SOLN
10.0000 meq | INTRAVENOUS | Status: AC
  Administered 2022-03-27 – 2022-03-28 (×2): 10 meq via INTRAVENOUS
  Filled 2022-03-27 (×2): qty 100

## 2022-03-27 MED ORDER — SUCCINYLCHOLINE CHLORIDE 200 MG/10ML IV SOSY
PREFILLED_SYRINGE | INTRAVENOUS | Status: DC | PRN
  Administered 2022-03-27: 120 mg via INTRAVENOUS

## 2022-03-27 MED ORDER — MIDAZOLAM HCL 2 MG/2ML IJ SOLN
INTRAMUSCULAR | Status: DC | PRN
  Administered 2022-03-27: 2 mg via INTRAVENOUS

## 2022-03-27 MED ORDER — HEPARIN SODIUM (PORCINE) 1000 UNIT/ML IJ SOLN
INTRAMUSCULAR | Status: AC
  Filled 2022-03-27: qty 20

## 2022-03-27 MED ORDER — HYDROMORPHONE HCL 1 MG/ML IJ SOLN
INTRAMUSCULAR | Status: AC
  Filled 2022-03-27: qty 1

## 2022-03-27 MED ORDER — ONDANSETRON HCL 4 MG/2ML IJ SOLN: 4.0000 mg | Freq: Once | INTRAMUSCULAR | Status: DC | PRN

## 2022-03-27 MED ORDER — OXYCODONE HCL 5 MG/5ML PO SOLN: 5.0000 mg | Freq: Once | ORAL | Status: DC | PRN

## 2022-03-27 MED ORDER — SODIUM CHLORIDE (PF) 0.9 % IJ SOLN
INTRAMUSCULAR | Status: DC | PRN
  Administered 2022-03-27: 100 mL

## 2022-03-27 MED ORDER — LIDOCAINE HCL (CARDIAC) PF 100 MG/5ML IV SOSY
PREFILLED_SYRINGE | INTRAVENOUS | Status: DC | PRN
  Administered 2022-03-27: 100 mg via INTRAVENOUS

## 2022-03-27 MED ORDER — SODIUM CHLORIDE 0.9 % IV SOLN: INTRAVENOUS | Status: DC | PRN

## 2022-03-27 MED ORDER — HYDROMORPHONE HCL 1 MG/ML IJ SOLN
INTRAMUSCULAR | Status: DC | PRN
  Administered 2022-03-27: .25 mg via INTRAVENOUS
  Administered 2022-03-27: .5 mg via INTRAVENOUS
  Administered 2022-03-27: .25 mg via INTRAVENOUS

## 2022-03-27 MED ORDER — FENTANYL CITRATE (PF) 100 MCG/2ML IJ SOLN
INTRAMUSCULAR | Status: AC
  Filled 2022-03-27: qty 2

## 2022-03-27 MED ORDER — EPHEDRINE SULFATE (PRESSORS) 50 MG/ML IJ SOLN
INTRAMUSCULAR | Status: DC | PRN
  Administered 2022-03-27 (×2): 5 mg via INTRAVENOUS
  Administered 2022-03-27: 10 mg via INTRAVENOUS

## 2022-03-27 MED ORDER — KETOROLAC TROMETHAMINE 30 MG/ML IJ SOLN
30.0000 mg | Freq: Four times a day (QID) | INTRAMUSCULAR | Status: AC
  Administered 2022-03-27 – 2022-04-01 (×20): 30 mg via INTRAVENOUS
  Filled 2022-03-27 (×20): qty 1

## 2022-03-27 MED ORDER — DEXAMETHASONE SODIUM PHOSPHATE 10 MG/ML IJ SOLN
INTRAMUSCULAR | Status: AC
  Filled 2022-03-27: qty 1

## 2022-03-27 MED ORDER — OXYCODONE HCL 5 MG PO TABS: 5.0000 mg | ORAL_TABLET | Freq: Once | ORAL | Status: DC | PRN

## 2022-03-27 MED ORDER — PROPOFOL 10 MG/ML IV BOLUS
INTRAVENOUS | Status: AC
  Filled 2022-03-27: qty 20

## 2022-03-27 MED ORDER — ALBUMIN HUMAN 25 % IV SOLN
12.5000 g | Freq: Once | INTRAVENOUS | Status: AC
Start: 2022-03-27 — End: 2022-03-27
  Administered 2022-03-27: 12.5 g via INTRAVENOUS
  Filled 2022-03-27: qty 50

## 2022-03-27 MED ORDER — FENTANYL CITRATE (PF) 100 MCG/2ML IJ SOLN: 25.0000 ug | INTRAMUSCULAR | Status: DC | PRN

## 2022-03-27 MED ORDER — DEXAMETHASONE SODIUM PHOSPHATE 10 MG/ML IJ SOLN
INTRAMUSCULAR | Status: DC | PRN
  Administered 2022-03-27: 5 mg via INTRAVENOUS

## 2022-03-27 MED ORDER — FENTANYL CITRATE (PF) 100 MCG/2ML IJ SOLN
INTRAMUSCULAR | Status: DC | PRN
  Administered 2022-03-27 (×4): 50 ug via INTRAVENOUS

## 2022-03-27 MED ORDER — LIDOCAINE HCL (PF) 2 % IJ SOLN
INTRAMUSCULAR | Status: AC
  Filled 2022-03-27: qty 5

## 2022-03-27 SURGICAL SUPPLY — 73 items
ADHESIVE MASTISOL STRL (MISCELLANEOUS) ×2 IMPLANT
APPLIER CLIP 11 MED OPEN (CLIP)
APPLIER CLIP 13 LRG OPEN (CLIP)
BARRIER ADH SEPRAFILM 3INX5IN (MISCELLANEOUS) ×2 IMPLANT
BLADE CLIPPER SURG (BLADE) ×2 IMPLANT
BLADE SURG SZ10 CARB STEEL (BLADE) ×3 IMPLANT
BNDG GAUZE DERMACEA FLUFF (GAUZE/BANDAGES/DRESSINGS) ×1
BNDG GAUZE DERMACEA FLUFF 4 (GAUZE/BANDAGES/DRESSINGS) IMPLANT
BULB RESERV EVAC DRAIN JP 100C (MISCELLANEOUS) ×1 IMPLANT
CHLORAPREP W/TINT 26 (MISCELLANEOUS) ×3 IMPLANT
CLIP APPLIE 11 MED OPEN (CLIP) IMPLANT
CLIP APPLIE 13 LRG OPEN (CLIP) IMPLANT
COVER BACK TABLE REUSABLE LG (DRAPES) ×2 IMPLANT
DRAIN CHANNEL JP 19F (MISCELLANEOUS) ×1 IMPLANT
DRAPE LAPAROTOMY 100X77 ABD (DRAPES) ×3 IMPLANT
DRSG OPSITE POSTOP 4X10 (GAUZE/BANDAGES/DRESSINGS) IMPLANT
DRSG OPSITE POSTOP 4X12 (GAUZE/BANDAGES/DRESSINGS) IMPLANT
DRSG TEGADERM 4X4.75 (GAUZE/BANDAGES/DRESSINGS) ×1 IMPLANT
ELECT BLADE 6.5 EXT (BLADE) ×3 IMPLANT
ELECT EZSTD 165MM 6.5IN (MISCELLANEOUS) ×3
ELECT REM PT RETURN 9FT ADLT (ELECTROSURGICAL) ×3
ELECTRODE EZSTD 165MM 6.5IN (MISCELLANEOUS) ×2 IMPLANT
ELECTRODE REM PT RTRN 9FT ADLT (ELECTROSURGICAL) ×2 IMPLANT
GAUZE 4X4 16PLY ~~LOC~~+RFID DBL (SPONGE) ×2 IMPLANT
GAUZE SPONGE 4X4 12PLY STRL (GAUZE/BANDAGES/DRESSINGS) ×4 IMPLANT
GLOVE BIO SURGEON STRL SZ7 (GLOVE) ×10 IMPLANT
GOWN STRL REUS W/ TWL LRG LVL3 (GOWN DISPOSABLE) ×8 IMPLANT
GOWN STRL REUS W/TWL LRG LVL3 (GOWN DISPOSABLE) ×5
HANDLE SUCTION POOLE (INSTRUMENTS) ×2 IMPLANT
HANDLE YANKAUER SUCT BULB TIP (MISCELLANEOUS) ×2 IMPLANT
KIT OSTOMY 2 PC DRNBL 2.25 STR (WOUND CARE) IMPLANT
KIT OSTOMY DRAINABLE 2.25 STR (WOUND CARE) ×2
KIT TURNOVER KIT A (KITS) ×3 IMPLANT
LABEL OR SOLS (LABEL) ×3 IMPLANT
LIGASURE IMPACT 36 18CM CVD LR (INSTRUMENTS) ×2 IMPLANT
MANIFOLD NEPTUNE II (INSTRUMENTS) ×3 IMPLANT
NEEDLE HYPO 22GX1.5 SAFETY (NEEDLE) ×6 IMPLANT
NS IRRIG 1000ML POUR BTL (IV SOLUTION) ×9 IMPLANT
PACK BASIN MAJOR ARMC (MISCELLANEOUS) ×3 IMPLANT
PACK COLON CLEAN CLOSURE (MISCELLANEOUS) ×2 IMPLANT
RELOAD GRN CONTOUR (ENDOMECHANICALS) ×6 IMPLANT
RELOAD PROXIMATE 75MM BLUE (ENDOMECHANICALS) IMPLANT
RELOAD STAPLE 40 GRN THCK (ENDOMECHANICALS) IMPLANT
RELOAD STAPLE 75 3.8 BLU REG (ENDOMECHANICALS) IMPLANT
SPONGE DRAIN TRACH 4X4 STRL 2S (GAUZE/BANDAGES/DRESSINGS) ×1 IMPLANT
SPONGE T-LAP 18X18 ~~LOC~~+RFID (SPONGE) ×18 IMPLANT
SPONGE T-LAP 18X36 ~~LOC~~+RFID STR (SPONGE) ×1 IMPLANT
STAPLER CVD CUT BL 40 RELOAD (ENDOMECHANICALS) IMPLANT
STAPLER CVD CUT BLU 40 RELOAD (ENDOMECHANICALS) ×2 IMPLANT
STAPLER CVD CUT GN 40 RELOAD (ENDOMECHANICALS) ×3 IMPLANT
STAPLER CVD CUT GRN 40 RELOAD (ENDOMECHANICALS) IMPLANT
STAPLER PROXIMATE 75MM BLUE (STAPLE) IMPLANT
STAPLER SKIN PROX 35W (STAPLE) ×2 IMPLANT
SUCTION POOLE HANDLE (INSTRUMENTS)
SUT ETHILON 3-0 FS-10 30 BLK (SUTURE) ×6
SUT PDS AB 0 CT1 27 (SUTURE) ×7 IMPLANT
SUT PROLENE 2 0 SH DA (SUTURE) ×1 IMPLANT
SUT SILK 2 0 (SUTURE)
SUT SILK 2 0 SH CR/8 (SUTURE) ×2 IMPLANT
SUT SILK 2 0SH CR/8 30 (SUTURE) ×4 IMPLANT
SUT SILK 2-0 18XBRD TIE 12 (SUTURE) ×2 IMPLANT
SUT VIC AB 0 CT1 36 (SUTURE) ×4 IMPLANT
SUT VIC AB 2-0 SH 27 (SUTURE)
SUT VIC AB 2-0 SH 27XBRD (SUTURE) ×4 IMPLANT
SUT VIC AB 3-0 SH 27 (SUTURE) ×4
SUT VIC AB 3-0 SH 27X BRD (SUTURE) ×2 IMPLANT
SUTURE EHLN 3-0 FS-10 30 BLK (SUTURE) IMPLANT
SYR 20ML LL LF (SYRINGE) ×3 IMPLANT
SYR 30ML LL (SYRINGE) ×6 IMPLANT
SYR 3ML LL SCALE MARK (SYRINGE) ×3 IMPLANT
TAPE PAPER 2X10 WHT MICROPORE (GAUZE/BANDAGES/DRESSINGS) ×1 IMPLANT
TRAY FOLEY MTR SLVR 16FR STAT (SET/KITS/TRAYS/PACK) ×3 IMPLANT
WATER STERILE IRR 500ML POUR (IV SOLUTION) ×2 IMPLANT

## 2022-03-27 NOTE — Anesthesia Procedure Notes (Signed)
Procedure Name: Intubation Date/Time: 03/27/2022 1:27 PM  Performed by: Cammie Sickle, CRNAPre-anesthesia Checklist: Patient identified, Patient being monitored, Timeout performed, Emergency Drugs available and Suction available Patient Re-evaluated:Patient Re-evaluated prior to induction Oxygen Delivery Method: Circle system utilized Preoxygenation: Pre-oxygenation with 100% oxygen Induction Type: IV induction, Cricoid Pressure applied and Rapid sequence Laryngoscope Size: 3 and McGraph Grade View: Grade I Tube type: Oral Tube size: 7.0 mm Number of attempts: 1 Airway Equipment and Method: Stylet Placement Confirmation: ETT inserted through vocal cords under direct vision, positive ETCO2 and breath sounds checked- equal and bilateral Secured at: 22 cm Tube secured with: Tape Dental Injury: Teeth and Oropharynx as per pre-operative assessment

## 2022-03-27 NOTE — Progress Notes (Addendum)
ADDENDUM 9:04 AM: Patient's leukocytosis significant worse this morning, now up to 17.5K (from 10.8K). Given increased leukocytosis, fevers, and lack of improvement clinically, I do think the prudent choice is to proceed to the operating for exploration. Will plan on laparotomy with possible colectomy, possible colostomy creation, possible diverting ileostomy. I discussed this in detail with the patient and her mother including all risks, benefits, and alternatives. They are in agreement with proceeding.   Of note, Hypokalemia to 2.9, ? Refeeding vs GI losses. Will replete this but should not delay surgical intervention given emergent nature. Would hold on advancing TPN to goal to prevent further/additional refeeding.     [facility] SURGICAL PROGRESS NOTE (cpt [phone])  Hospital Day(s): 6.   Post op day(s): 9 days s/p robotic L salpingectomy   Interval History: Patient seen and examined. She continues to run intermittent fevers overnight. T-max 101.47F in last 24 hours./ Last fever was 101.81F at 2200. This morning, she reports that she continues to have lower and right sided abdominal pain which is relatively unchanged compared to yesterday. No nausea, emesis. Labs are currently being drawn. Again, BE yesterday confirmed sigmoid injury with leak. Drain output only 15 ccs. She continues on Unasyn and Fluconazole; ID on board. She is NPO; TPN initiated 08/01.   Review of Systems:  Constitutional: + fever, denied chills  HEENT: denies cough or congestion  Respiratory: denies any shortness of breath  Cardiovascular: denies chest pain or palpitations  Gastrointestinal: + abdominal pain, denied nausea/emesis Genitourinary: denies burning with urination or urinary frequency  Vital signs in last 24 hours: [min-max] current  Temp:  [99.4 F (37.4 C)-101.5 F (38.6 C)] 99.6 F (37.6 C) (08/02 0400) Pulse Rate:  [73-103] 93 (08/02 0700) Resp:  [13-24] 14 (08/02 0700) BP:  (101-126)/(73-81) 109/76 (08/02 0700) SpO2:  [93 %-98 %] 96 % (08/02 0700) Weight:  [98 kg-101 kg] 101 kg (08/02 0700)     Height: '5\' 3"'$  (160 cm) Weight: 101 kg BMI (Calculated): 39.44   Intake/Output last 2 shifts:  08/01 0701 - 08/02 0700 In: 1776.2 [I.V.:472.1; IV Piggyback:1294] Out: 1090 [Urine:875; Emesis/NG output:200; Drains:15]   Physical Exam:  Constitutional: alert, cooperative and no distress  HENT: normocephalic without obvious abnormality; NGT in place Eyes: PERRL, EOM's grossly intact and symmetric  Respiratory: breathing non-labored at rest  Cardiovascular: regular rate and sinus rhythm  Gastrointestinal: Abdomen is soft, she remains tender in the lower abdomen and on the right side, distension difficult to appreciate given habitus, no rebound/guarding. She is not overtly peritonitic. Drain in the LLQ with thin brown fluid, output decreased Musculoskeletal: no edema or wounds, motor and sensation grossly intact, NT    Labs:     Latest Ref Rng & Units 03/26/2022    6:22 AM 03/25/2022    3:53 AM 03/24/2022    4:47 AM  CBC  WBC 4.0 - 10.5 K/uL 10.8  13.0  13.3   Hemoglobin 12.0 - 15.0 g/dL 11.1  10.8  12.3   Hematocrit 36.0 - 46.0 % 33.0  32.3  36.4   Platelets 150 - 400 K/uL 165  189  213       Latest Ref Rng & Units 03/26/2022    6:22 AM 03/25/2022    3:53 AM 03/24/2022    4:47 AM  CMP  Glucose 70 - 99 mg/dL 72  81  119   BUN 6 - 20 mg/dL '18  14  11   '$ Creatinine 0.44 - 1.00 mg/dL 0.53  0.82  0.68   Sodium 135 - 145 mmol/L 137  135  133   Potassium 3.5 - 5.1 mmol/L 3.7  3.0  3.0   Chloride 98 - 111 mmol/L 102  97  98   CO2 22 - 32 mmol/L '25  30  26   '$ Calcium 8.9 - 10.3 mg/dL 8.0  7.6  7.6   Total Protein 6.5 - 8.1 g/dL  5.1  5.6   Total Bilirubin 0.3 - 1.2 mg/dL  3.4  3.3   Alkaline Phos 38 - 126 U/L  49  62   AST 15 - 41 U/L  18  20   ALT 0 - 44 U/L  14  21      Imaging studies:   KUB (03/27/2022) personally reviewed which shows contrast throughout colon,  minimal small bowel dilation, drain seen with what seems to potentially be pooling contrast, there does seem to be contrast in distal rectum (contrast likely from BE), and radiologist report pending...   Assessment/Plan: (ICD-10's: K17.7) 36 y.o. female admitted with, improved, sepsis thought to be secondary to retrouterine abscess following robotic assisted laparoscopic left salpingectomy, lysis of adhesion, and drainage of 500 ccs peritoneal cyst  on 07/24; however, she was found to have sigmoid injury and leak on BE, currently seems controlled with drain.    - I had a long discussion again with patient, and her mother, at bedside. I still feel this is somewhat a grey area. She has been febrile x2 in last 24 hours and her pain is relatively unchanged. Labs are pending this morning. Fortunately, she is otherwise hemodynamically stable and there is not massive pneumoperitoneum on imaging. I will await labs, but I worry she has not progressed appropriately with conservative measures and may require washout with diverting loop ileostomy vs Hartman's. Will await labs and discussion with Dr Dahlia Byes.    - Continue NPO + TPN; monitor electrolytes; advance to goal - Continue NGT to LIS; monitor and record output - Continue IB Abx (Unasyn); Antifungals (Fluconazole); Cx reviewed; ID on board - Continue drain; monitor and record output; flush per IR recommendations - Monitor fever curve - Monitor abdominal examination; on-going bowel function    - Monitor leukocytosis; labs pending   - Pain control prn; antiemetics prn - Further management per primary services; we will follow    All of the above findings and recommendations were discussed with the patient, patient's family (mother at bedside), and the medical team, and all of patient's and family's questions were answered to their expressed satisfaction.  Face-to-face time spent with the patient and care providers was 60 minutes, with more than 50% of the  time spent counseling, educating, and coordinating care of the patient.    -- [name], PA-C [facility] 03/27/2022, 7:20 AM M-F: 7am - 4pm

## 2022-03-27 NOTE — Transfer of Care (Signed)
Immediate Anesthesia Transfer of Care Note  Patient: Lacey Harrison  Procedure(s) Performed: EXPLORATORY LAPAROTOMY COLECTOMY WITH COLOSTOMY CREATION/HARTMANN PROCEDURE  Patient Location: PACU  Anesthesia Type:General  Level of Consciousness: awake, alert  and oriented  Airway & Oxygen Therapy: Patient Spontanous Breathing and Patient connected to nasal cannula oxygen  Post-op Assessment: Report given to RN and Post -op Vital signs reviewed and stable  Post vital signs: Reviewed and stable  Last Vitals:  Vitals Value Taken Time  BP 94/60 03/27/22 1753  Temp    Pulse 102 03/27/22 1758  Resp 20 03/27/22 1758  SpO2 91 % 03/27/22 1758  Vitals shown include unvalidated device data.  Last Pain:  Vitals:   03/27/22 1227  TempSrc: Tympanic  PainSc: Asleep      Patients Stated Pain Goal: 3 (94/32/76 1470)  Complications: No notable events documented.

## 2022-03-27 NOTE — Progress Notes (Signed)
Patient seen in Pre-op area.  Given opportunity to ask any questions before proceeding with surgery.  Questions answered.    Rubie Maid, MD [facility]

## 2022-03-27 NOTE — Progress Notes (Addendum)
PROGRESS NOTE    Lacey Harrison  NGE:[id number] DOB: 1986-06-30 DOA: 03/21/2022 PCP: Rubie Maid, MD   Brief Narrative:  This 36 yo Female presenting to [facility] ED from home on 03/21/22 with complaints of sudden onset severe abdominal pain. She recently on 03/18/22 had robotic adhesiolysis, left salpingectomy, drainage of 500 mL from large LEFT peritoneal cyst & chromotubation. She was discharged home on 4/01 without complication. She reported being on the floor with her cat and twisting her body around, with sudden acute abdominal pain. Patient complained of severe pain in lower abdomen radiating towards her vagina.  07/27: CT imaging abscess. Leukocytosis with left shift at 16.8. Initiated on IV Zosyn and Doxycycline. Admitted to OBGYN.  07/28: IR procedure 07/28 CT drain placement into pelvic abscess, left anterior approach, 12 Fr to bulb suction. Patient developed severe sepsis/septic shock after drainage of abdominal pelvic abscess.  She required transfer to stepdown unit, dyspnea worsened with desaturations noted, due to concerns for respiratory status PCCM consulted, receiving BS abx w/ zosyn, doxycycline & vancomycin, volume resuscitation w/ IVF, awaiting culture results   07/29: Remains in SDU w/ ileus and sepsis. IV antibiotics: Continue Zosyn, added fluconazole due to budding yeast noted on abscess fluid, Discontinue doxycycline/vancomycin x1, continue IVF hydration - bicarb infusion, DC other IV fluids due to anasarca. Developing ileus, query feculent appearance on abscess drain, repeat CT abdomen pelvis performed, new loculations on the right, anasarca, distended urinary bladder. No contrast extravasation to suggest bowel perforation 07/30: Continues to have abdominal pain, required NG last night due to recurrent vomiting.  Had 1200 mL of bilious material suctioned from stomach.  NG in place, abdomen less distended. IV antibiotics: Continue Zosyn, fluconazole, added Clindamycin d/t clostridium  prefringens noted on culture.  07/31: repeat CT The abscess is slightly decreased compared to prior exam. Continuing on Unasyn and Diflucan.  08/01: VSS, WBC trending down, BCx NGx4d. Ileus no better. Drain in place, minimal output. NG in place. PICC and TPN initiated. Spoke w/ GYN, plan for surgical consult and ID consult. Pt is in significant pain though lab/VS parameters and imaging show improvement, suspect pain more d/t ileus. 8/02: Patient continued to have persistently elevated white cell count with abdominal pain.  General surgery has scheduled exploratory laparotomy.  Assessment & Plan:   Principal Problem:   Postoperative intra-abdominal abscess Active Problems:   Acute respiratory failure with hypoxia (HCC) in seting of MSKpain, atelectasis, severe sepsis    Severe sepsis (HCC) without septic shock, d/t postoperative abscess, s/p pelvic drain placement    Ileus, postoperative (HCC)   AKI (acute kidney injury) ([place]) in setting of sepsis, IV contrast, NAGMA - RESOLVED    Abdominal pain   Severe sepsis ([place]) without septic shock, d/t postoperative abscess, s/p pelvic drain placement : Cultures growing budding yeast, EColi and Clostridium --> Continue IV abx: Unasyn and Diflucan Abscess shows some improvement on CTon 07/30 --> repeat imaging prn worsening  Monitor CBC and VS for sepsis parameters --> improving Patient is scheduled to have exploratory laparotomy today.   Acute respiratory failure with hypoxia (HCC) in seting of MSKpain, atelectasis, severe sepsis  Continue Supplemental O2 prn goal >90% Continue bronchodilators  Incentive spirometry    AKI (acute kidney injury) ([place]) in setting of sepsis, IV contrast, NAGMA - RESOLVED  Trend BMP I&O close monitor UOP   Ileus, postoperative ([place]) NG in place PICC and TPN placed on 03/26/22  Po diet as tolerated  General surgery consulted.    Postoperative  intra-abdominal abscess GYN following ID and general surgery  consulted Leukocytosis worsening, general surgery recommended exploratory laparotomy.   Abdominal pain Suspect related more to ileus since infection parameters are improving Discussed would like to avoid opiates if possible as this will not improve ileus Trial switching morphine to Dilaudid low-dose today Patient encouraged out of bed/walking   Hypokalemia: Replaced.  Continue to monitor  DVT prophylaxis: heparin Code Status: Full code. Family Communication: Family/ mother at bed side. Disposition Plan:  Pending improvement in ileus, needing exploratory laparotomy today.  Consultants:  PCCM OB/GYN IR General surgery Infectious diseases  Procedures:  07/28 CT drain placement into pelvic abscess 07/30 NG tube placed  07/31 PICC line.  Antimicrobials:   Anti-infectives (From admission, onward)    Start     Dose/Rate Route Frequency Ordered Stop   03/25/22 1600  [MAR Hold]  Ampicillin-Sulbactam (UNASYN) 3 g in sodium chloride 0.9 % 100 mL IVPB        (MAR Hold since Wed 03/27/2022 at 1222.Hold Reason: Transfer to a Procedural area)   3 g 200 mL/hr over 30 Minutes Intravenous Every 6 hours 03/25/22 1134     03/24/22 2200  clindamycin (CLEOCIN) IVPB 900 mg  Status:  Discontinued        900 mg 100 mL/hr over 30 Minutes Intravenous Every 8 hours 03/24/22 1854 03/25/22 1134   03/23/22 1500  [MAR Hold]  fluconazole (DIFLUCAN) IVPB 400 mg        (MAR Hold since Wed 03/27/2022 at 1222.Hold Reason: Transfer to a Procedural area)   400 mg 100 mL/hr over 120 Minutes Intravenous Every 24 hours 03/23/22 1358     03/22/22 1851  vancomycin variable dose per unstable renal function (pharmacist dosing)  Status:  Discontinued         Does not apply See admin instructions 03/22/22 1851 03/23/22 1051   03/22/22 1645  vancomycin (VANCOCIN) IVPB 1000 mg/200 mL premix  Status:  Discontinued        1,000 mg 200 mL/hr over 60 Minutes Intravenous  Once 03/22/22 1553 03/22/22 1558   03/22/22 1645   vancomycin (VANCOREADY) IVPB 2000 mg/400 mL        2,000 mg 200 mL/hr over 120 Minutes Intravenous  Once 03/22/22 1558 03/22/22 1814   03/22/22 0400  piperacillin-tazobactam (ZOSYN) IVPB 3.375 g  Status:  Discontinued        3.375 g 12.5 mL/hr over 240 Minutes Intravenous Every 8 hours 03/21/22 2047 03/25/22 1134   03/21/22 2200  doxycycline (VIBRAMYCIN) 100 mg in sodium chloride 0.9 % 250 mL IVPB  Status:  Discontinued        100 mg 125 mL/hr over 120 Minutes Intravenous Every 12 hours 03/21/22 2047 03/23/22 1358   03/21/22 1900  piperacillin-tazobactam (ZOSYN) IVPB 3.375 g        3.375 g 12.5 mL/hr over 240 Minutes Intravenous STAT 03/21/22 1853 03/21/22 2300        Subjective: Patient was seen and examined at bedside.  Overnight events noted.   Patient reports not feeling well.  States she still has significant amount of pain, Patient remains on NG tube, is scheduled to have exploratory laparotomy today.  Objective: Vitals:   03/27/22 1000 03/27/22 1100 03/27/22 1200 03/27/22 1227  BP: 110/77 109/80 117/76 117/76  Pulse: 88 87 92 96  Resp: (!) '25 20 17 18  '$ Temp:    99 F (37.2 C)  TempSrc:    Tympanic  SpO2: 98% 96% 97% 96%  Weight:  Height:        Intake/Output Summary (Last 24 hours) at 03/27/2022 1641 Last data filed at 03/27/2022 1627 Gross per 24 hour  Intake 4026.17 ml  Output 965 ml  Net 3061.17 ml   Filed Weights   03/21/22 1722 03/26/22 1800 03/27/22 0700  Weight: 91 kg 98 kg 101 kg    Examination:  General exam: Appears comfortable, remains in a lot of pain, NG tube connected with suction. Respiratory system: CTA bilaterally, respiratory effort normal.  No wheezing, no crackles. Cardiovascular system: S1 & S2 heard, regular rate and rhythm, no murmur. Gastrointestinal system: Abdomen is soft, mildly tender, distended,  bowel sounds: Sluggish Central nervous system: Alert and oriented X 3. No focal neurological deficits. Extremities: No edema, no  cyanosis, no clubbing. Skin: No rashes, lesions or ulcers Psychiatry: Judgement and insight appear normal. Mood & affect appropriate.     Data Reviewed: I have personally reviewed following labs and imaging studies  CBC: Recent Labs  Lab 03/21/22 1744 03/22/22 0543 03/22/22 1048 03/23/22 0530 03/24/22 0447 03/25/22 0353 03/26/22 0622 03/27/22 0747  WBC 16.8* 9.9 11.5* 9.0 13.3* 13.0* 10.8* 17.5*  NEUTROABS 14.5* 8.3* 10.0*  --   --   --  7.5  --   HGB 15.3* 14.7 13.8 13.6 12.3 10.8* 11.1* 10.5*  HCT 44.5 43.1 40.2 39.9 36.4 32.3* 33.0* 32.2*  MCV 84.3 85.2 84.6 85.4 84.3 86.1 86.4 87.3  PLT 294 290 260 199 213 189 165 622   Basic Metabolic Panel: Recent Labs  Lab 03/23/22 0431 03/24/22 0447 03/25/22 0353 03/26/22 0622 03/27/22 0747  NA 136 133* 135 137 140  K 3.7 3.0* 3.0* 3.7 2.9*  CL 108 98 97* 102 105  CO2 19* '26 30 25 30  '$ GLUCOSE 111* 119* 81 72 126*  BUN '16 11 14 18 12  '$ CREATININE 0.82 0.68 0.82 0.53 0.51  CALCIUM 7.2* 7.6* 7.6* 8.0* 7.8*  MG 2.6* 2.6* 2.4 2.3 1.9  PHOS 3.8 1.7* 2.2* 4.8* 3.2   GFR: Estimated Creatinine Clearance: 111.3 mL/min (by C-G formula based on SCr of 0.51 mg/dL). Liver Function Tests: Recent Labs  Lab 03/22/22 0924 03/23/22 0431 03/24/22 0447 03/25/22 0353 03/27/22 0747  AST '27 26 20 18 '$ 34  ALT 34 '23 21 14 17  '$ ALKPHOS 35* 41 62 49 56  BILITOT 4.9* 4.0* 3.3* 3.4* 4.5*  PROT 6.2* 5.5* 5.6* 5.1* 5.3*  ALBUMIN 3.3* 2.6* 2.4* 2.1* 2.2*   Recent Labs  Lab 03/21/22 1744  LIPASE 26   No results for input(s): "AMMONIA" in the last 168 hours. Coagulation Profile: Recent Labs  Lab 03/22/22 1048  INR 1.3*   Cardiac Enzymes: No results for input(s): "CKTOTAL", "CKMB", "CKMBINDEX", "TROPONINI" in the last 168 hours. BNP (last 3 results) No results for input(s): "PROBNP" in the last 8760 hours. HbA1C: No results for input(s): "HGBA1C" in the last 72 hours. CBG: Recent Labs  Lab 03/22/22 2223 03/27/22 0027  03/27/22 0552  GLUCAP 95 124* 135*   Lipid Profile: Recent Labs    03/27/22 0747  TRIG 283*   Thyroid Function Tests: No results for input(s): "TSH", "T4TOTAL", "FREET4", "T3FREE", "THYROIDAB" in the last 72 hours. Anemia Panel: No results for input(s): "VITAMINB12", "FOLATE", "FERRITIN", "TIBC", "IRON", "RETICCTPCT" in the last 72 hours. Sepsis Labs: Recent Labs  Lab 03/22/22 1403 03/22/22 2242 03/23/22 0431 03/24/22 0447 03/24/22 0908  PROCALCITON  --  27.66 27.17 16.44  --   LATICACIDVEN 4.9* 2.9* 2.5*  --  1.3  Recent Results (from the past 240 hour(s))  Culture, blood (x 2)     Status: None   Collection Time: 03/22/22 10:48 AM   Specimen: BLOOD  Result Value Ref Range Status   Specimen Description BLOOD BLOOD LEFT HAND  Final   Special Requests   Final    BOTTLES DRAWN AEROBIC AND ANAEROBIC Blood Culture adequate volume   Culture   Final    NO GROWTH 5 DAYS Performed at [facility], [address] [place], [place] [address]    Report Status 03/27/2022 FINAL  Final  Culture, blood (x 2)     Status: None   Collection Time: 03/22/22 11:00 AM   Specimen: BLOOD  Result Value Ref Range Status   Specimen Description BLOOD BLOOD RIGHT HAND  Final   Special Requests AEROBIC BOTTLE ONLY Blood Culture adequate volume  Final   Culture   Final    NO GROWTH 5 DAYS Performed at [facility], [address] [place], [place] [address]    Report Status 03/27/2022 FINAL  Final  Aerobic/Anaerobic Culture w Gram Stain (surgical/deep wound)     Status: None   Collection Time: 03/22/22 12:57 PM   Specimen: Wound; Abscess  Result Value Ref Range Status   Specimen Description   Final    WOUND Performed at [facility], [address], [place], [place] [address]    Special Requests   Final    ABD ABSCESS Performed at [facility], [address] [place], [place] [address]    Gram Stain   Final    ABUNDANT WBC PRESENT,BOTH PMN AND  MONONUCLEAR ABUNDANT GRAM NEGATIVE RODS FEW GRAM POSITIVE RODS FEW GRAM POSITIVE COCCI IN PAIRS FEW BUDDING YEAST SEEN    Culture   Final    ABUNDANT ESCHERICHIA COLI MODERATE CLOSTRIDIUM PERFRINGENS Standardized susceptibility testing for this organism is not available. ABUNDANT BACTEROIDES VULGATUS BETA LACTAMASE NEGATIVE Performed at [facility], [place] [address] [place], [place] [address]    Report Status 03/25/2022 FINAL  Final   Organism ID, Bacteria ESCHERICHIA COLI  Final      Susceptibility   Escherichia coli - MIC*    AMPICILLIN 8 SENSITIVE Sensitive     CEFAZOLIN <=4 SENSITIVE Sensitive     CEFEPIME <=0.12 SENSITIVE Sensitive     CEFTAZIDIME <=1 SENSITIVE Sensitive     CEFTRIAXONE <=0.25 SENSITIVE Sensitive     CIPROFLOXACIN <=0.25 SENSITIVE Sensitive     GENTAMICIN <=1 SENSITIVE Sensitive     IMIPENEM <=0.25 SENSITIVE Sensitive     TRIMETH/SULFA <=20 SENSITIVE Sensitive     AMPICILLIN/SULBACTAM <=2 SENSITIVE Sensitive     PIP/TAZO <=4 SENSITIVE Sensitive     * ABUNDANT ESCHERICHIA COLI  MRSA Next Gen by PCR, Nasal     Status: None   Collection Time: 03/22/22 11:00 PM   Specimen: Nasal Mucosa; Nasal Swab  Result Value Ref Range Status   MRSA by PCR Next Gen NOT DETECTED NOT DETECTED Final    Comment: (NOTE) The GeneXpert MRSA Assay (FDA approved for NASAL specimens only), is one component of a comprehensive MRSA colonization surveillance program. It is not intended to diagnose MRSA infection nor to guide or monitor treatment for MRSA infections. Test performance is not FDA approved in patients less than 76 years old. Performed at [facility], [address] [place],  [address]   Urine Culture     Status: None   Collection Time: 03/24/22  6:02 PM  Specimen: Urine, Catheterized  Result Value Ref Range Status   Specimen Description   Final    URINE, CATHETERIZED Performed at [facility], [address], [place],  [place] [address]    Special Requests   Final    Normal Performed at [facility], [address] [place], [place] [address]    Culture   Final    NO GROWTH Performed at [facility], [place] [address], [place], [place] [address]    Report Status 03/25/2022 FINAL  Final   Radiology Studies: DG Abd Portable 2V  Result Date: 03/27/2022 CLINICAL DATA:  Abdominal pain. History of postoperative pelvic abscess and sigmoid colon perforation. EXAM: PORTABLE ABDOMEN - 2 VIEW COMPARISON:  Multiple previous CT scans. Contrast enema study from yesterday. FINDINGS: The NG tube remains in good position. There is contrast noted throughout the colon from the study yesterday. Large collection of contrast in the pelvis in the region of the abscess drainage catheter consistent with the sigmoid colon leak seen yesterday. IMPRESSION: Large collection of contrast in the pelvis surrounding the pelvic drainage catheter in the region of the sigmoid colon leak seen yesterday. Electronically Signed   By: Marijo Sanes M.D.   On: 03/27/2022 08:42   DG BE (COLON)W SINGLE CM (SOL OR THIN BA)  Result Date: 03/26/2022 CLINICAL DATA:  Recent pelvic/gyn surgery. Postoperative pelvic abscess. Status post percutaneous drain. Request for barium enema study to evaluate for possible recto-sigmoid perforation. EXAM: WATER-SOLUBLE CONTRAST ENEMA TECHNIQUE: Initial scout AP supine abdominal image obtained to insure adequate colon cleansing. Water-soluble contrast was introduced into the colon in a retrograde fashion and refluxed from the rectum to the cecum. Spot images of the colon followed by overhead radiographs were obtained. FLUOROSCOPY: Radiation Exposure Index (as provided by the fluoroscopic device): 102.40 mGy Kerma CONTRAST:  450 CC OF OMNIPAQUE 300 COMPARISON:  CT abdomen and pelvis from 03/25/2022 FINDINGS: Contrast is seen promptly filling the rectum as well as the sigmoid and descending colon. Contrast ultimately reaches  the cecum without difficulty. There appears to be extraluminal contrast extravasation at the region of the sigmoid colon. Exact location is difficult to visualize. However there does appear to be contrast pooling at the location of the percutaneous drain catheter. IMPRESSION: Positive findings of contrast extravasation from the sigmoid colon consistent with bowel injury/perforation. Contrast appears to be predominantly pooling within the abscess cavity where percutaneous drainage catheter is presently in position. Read by: Ascencion Dike PA-C These results will be called to the ordering clinician or representative by the Radiologist Assistant, and communication documented in the PACS or [place]. Electronically Signed   By: Kerby Moors M.D.   On: 03/26/2022 17:03   [place] EKG SITE RITE  Result Date: 03/26/2022 If Site Rite image not attached, placement could not be confirmed due to current cardiac rhythm.   Scheduled Meds:  [MAR Hold] Chlorhexidine Gluconate Cloth  6 each Topical Daily   [MAR Hold] docusate sodium  100 mg Oral BID   [MAR Hold] heparin injection (subcutaneous)  5,000 Units Subcutaneous Q8H   [MAR Hold] insulin aspart  0-15 Units Subcutaneous Q6H   [MAR Hold] pantoprazole (PROTONIX) IV  40 mg Intravenous QHS   [MAR Hold] sodium chloride flush  10-40 mL Intracatheter Q12H   [MAR Hold] sodium chloride flush  5 mL Intracatheter Q8H   Continuous Infusions:  [MAR Hold] sodium chloride 10 mL/hr at 03/26/22 1939   sodium chloride Stopped (03/26/22 2302)   [MAR Hold] ampicillin-sulbactam (  UNASYN) IV 3 g (03/27/22 0916)   [MAR Hold] fluconazole (DIFLUCAN) IV Stopped (03/26/22 1939)   TPN ADULT (ION) 37.5 mL/hr at 03/27/22 0600   TPN ADULT (ION)       LOS: 6 days    Time spent: 50 mins    Catharine Kettlewell, MD [facility]   If 7PM-7AM, please contact night-coverage

## 2022-03-27 NOTE — Consult Note (Addendum)
PHARMACY - TOTAL PARENTERAL NUTRITION CONSULT NOTE   Indication: Prolonged ileus  Patient Measurements: Height: '5\' 3"'$  (160 cm) Weight: 101 kg (222 lb 9.6 oz) IBW/kg (Calculated) : 52.4 TPN AdjBW (KG): 62.1 Body mass index is 39.43 kg/m.  Assessment:  Patient is [name] with medical history including recent laparoscopic adhesiolysis, left salpingectomy, drainage of peritoneal cyst, and chromotubation on 7/24 who presented to the ED 7/27 with severe pain in abdomen and vagina. Imaging concerning for post-operative abscess. Patient underwent CT drain placement into pelvic abscess on 7/28. Hospital course has been complicated by ileus. Pharmacy consulted to initiate TPN to provide nutrition given post-operative ileus.   Glucose / Insulin: No history of diabetes. Patient has been normoglycemic. Goal BG 140 - 180 Electrolytes: Hypokalemia Renal: Scr < 1 Hepatic: No transaminitis. Hyperbilirubinemia Intake / Output; MIVF: I&O: + 8L. No MIVF GI Imaging: 7/31 CT abdomen / pelvis: Percutaneous drainage catheter is seen in left-sided abdominal abscess. Mild proximal small bowel dilatation is noted which is slightly improved compared to prior exam, most consistent with ileus. Stable mild ascites is noted in the pelvis and right side of the abdomen. GI Surgeries / Procedures:  7/28: CT drain placement into pelvic abscess 8/2: Pending ExLap with general surgery  Central access: Pending 8/1 TPN start date: 8/1 upon placement of PICC  Nutritional Goals: Goal TPN rate is 75 mL/hr (provides 108 g of protein and 2217 kcals per day)  RD Assessment: Estimated Needs Total Energy Estimated Needs: 2000-2300kcal/day Total Protein Estimated Needs: 100-115g/day Total Fluid Estimated Needs: 1.6-1.8L/day  Current Nutrition:  NPO  Plan:  --Advance TPN to 75 mL/hr (goal rate) at 1800 --Electrolytes in TPN: Na 56mq/L, K 593m/L, Ca 23m92mL, Mg 23mE39m, and Phos 123mm30m. Cl:Ac 1:1 K 2.9, IV Kcl 10 mEq  x 4 doses Re-check potassium at 1800 --Add standard MVI and trace elements to TPN, add thiamine 100 mg x 3 days (day # 2 / 3) --Initiate Moderate q6h SSI and adjust as needed  --Monitor TPN labs on Mon/Thurs, daily until stable  Bobbie Virden Benita Gutter2023,9:14 AM

## 2022-03-27 NOTE — Op Note (Signed)
PROCEDURES: Exploratory laparotomy Extensive lysis of adhesions taking about at least an hour and a half Sigmoid colectomy with end colostomy (Hartmann's procedure) Takedown of Splenic flexure   Pre-operative Diagnosis: Sigmoid colon injury and abscess  Post-operative Diagnosis: Same  Surgeon: [place]   Assistants: Otho Ket PA-C his assistant was necessary due to the complexity and acuity of the case  Anesthesia: General endotracheal anesthesia  ASA Class: 3   Surgeon: Caroleen Hamman , MD FACS  Anesthesia: Gen. with endotracheal tube  2nd Assistant: Dr. Marcelline Mates  Findings: 8 mm perforation anterior sigmoid colon Additional 2 mm perforation anterior sigmoid colon Fecal peritonitis   Estimated Blood Loss: 100CC         Drains: 19 Blake drain pelvis         Specimens: Sigmoid colon          Complications: None                Procedure Details  The patient was seen again in the Holding Room. The benefits, complications, treatment options, and expected outcomes were discussed with the patient. The risks of bleeding, infection, recurrence of symptoms, failure to resolve symptoms,  bowel injury, any of which could require further surgery were reviewed with the patient.   The patient was taken to Operating Room, identified as Lacey Harrison and the procedure verified.  A Time Out was held and the above information confirmed.  Prior to the induction of general anesthesia, antibiotic prophylaxis was administered. VTE prophylaxis was in place. General endotracheal anesthesia was then administered and tolerated well. After the induction, the abdomen was prepped with Chloraprep and draped in the sterile fashion. The patient was positioned in the supine position.  Generous midline laparotomy was performed using a 10 blade knife.  Electrocautery was used to dissect through the subcutaneous tissue and the midline fascia was identified.  Fascia was incised and the peritoneum was  elevated Metzenbaum scissors were used to enter the abdominal cavity.  There was obvious free air and obvious fecal peritonitis. Liquid stool was suctioned out and we irrigated the abdominal cavity.  We proceeded to extent our incision for appropriate exposure.  Please note that this was a very challenging case due to severe inflammatory response. ALSO NOTE THAT Otho Ket PA-C  was necessary due to the complexity and acuity of the case, he was instrumental in providing appropriate exposure helping with closure of the wound as well as performing the sigmoid resection.  ExTensive lyse of adhesions was performed primarily with Anger fracturing secondary to severe inflammatory response.  When I entered the abdominal cavity it looked very hostile I was finally able to delineate the anatomy after the lyse of adhesions was performed.  I run and inspected  the bowel and did not find any evidence of small bowel injuries.  I was able to gain access to the pelvis after resecting some necrotic omentum.  This time it became evident that the patient had a sigmoid injury with an 8 mm ulceration next to a smaller ulceration.  Using the LigaSure device I was able to divide the mesentery of the sigmoid colon and because of severe pelvic inflammatory response I resected the proximal margin first with a contour stapler in the standard fashion.  I then used the sigmoid colon as a handle and divided the miso rectum as well as the mesentery of the sigmoid colon.  We were able to make sure that the distal margin incorporated the injury. I placed  a 2 Prolene suture to identify the distal rectal stump. I also placed a 19 Blake drain within the pelvis. I irrigated abdominal cavity profusely with about 5 L of warm normal saline. I was not able to define the left ureter due to severe inflammatory response and my fear of doing more harm than good.  I did stay very close to the sigmoid wall to prevent any injuries.  I had to mobilize  the splenic flexure with some LigaSure device as well as electrocautery given that she had a short mesentery as well as a generous abdominal wall. I was able to perform a defect within the abdominal wall in the left upper quadrant due to her body habitus.  Electrocautery was used to dissect through subcutaneous tissue and I incised the fascia in a cruciate form.  The descending colon and was brought through the defect. Liposomal Marcaine was injected both aspects of the abdominal wall under direct visualization.  The midline fascia was approximated using the small bite technique with 0 continuous PDS.  I decided to leave the skin open due to fecal contamination.  A wet-to-dry was placed.  Attention then was turned to the left upper quadrant where the end colostomy was matured in the standard fashion with multiple 3-0 Vicryl.  Colostomy device applied  Needle and laparotomy count were correct and there were no immediate complications.  Caroleen Hamman, MD, FACS

## 2022-03-27 NOTE — Anesthesia Preprocedure Evaluation (Signed)
Anesthesia Evaluation  Patient identified by MRN, date of birth, ID band Patient awake  General Assessment Comment:  53F ill-appearing, otherwise relatively healthy, with bowel perforation, sepsis. TPN infusing via PICC line. No pressors. NGT in place. Hypokalemic K=2.9, being repleted, but case will not be delayed due to surgeon calling it an emergency  Reviewed: Allergy & Precautions, NPO status , Patient's Chart, lab work & pertinent test results  History of Anesthesia Complications Negative for: history of anesthetic complications  Airway Mallampati: II  TM Distance: >3 FB Neck ROM: Full    Dental no notable dental hx. (+) Teeth Intact   Pulmonary neg pulmonary ROS, neg sleep apnea, neg COPD, Patient abstained from smoking.Not current smoker, former smoker,    Pulmonary exam normal breath sounds clear to auscultation       Cardiovascular Exercise Tolerance: Good METS(-) hypertension(-) CAD and (-) Past MI negative cardio ROS  (-) dysrhythmias  Rhythm:Regular Rate:Normal - Systolic murmurs    Neuro/Psych  Headaches, PSYCHIATRIC DISORDERS Anxiety Depression    GI/Hepatic neg GERD  ,(+)     (-) substance abuse  ,   Endo/Other  neg diabetes  Renal/GU negative Renal ROS     Musculoskeletal   Abdominal (+) + obese,   Peds  Hematology   Anesthesia Other Findings Past Medical History: No date: Allergy No date: Anxiety No date: Chicken pox No date: Depression No date: Heart murmur No date: Lyme disease     Comment:  patient reports a remote history of lyme and alludes               that she has "chronic lyme" No date: Migraines     Comment:  MIGRAINES 07/2017: Pneumonia  Reproductive/Obstetrics                             Anesthesia Physical Anesthesia Plan  ASA: 3 and emergent  Anesthesia Plan: General   Post-op Pain Management: Toradol IV (intra-op)* and Dilaudid IV    Induction: Intravenous and Rapid sequence  PONV Risk Score and Plan: 4 or greater and Ondansetron, Dexamethasone and Midazolam  Airway Management Planned: Oral ETT  Additional Equipment: Arterial line  Intra-op Plan:   Post-operative Plan: Extubation in OR  Informed Consent: I have reviewed the patients History and Physical, chart, labs and discussed the procedure including the risks, benefits and alternatives for the proposed anesthesia with the patient or authorized representative who has indicated his/her understanding and acceptance.     Dental advisory given  Plan Discussed with: CRNA and Surgeon  Anesthesia Plan Comments: (Discussed risks of anesthesia with patient, including PONV, sore throat, lip/dental/eye damage. Rare risks discussed as well, such as cardiorespiratory and neurological sequelae, and allergic reactions. Discussed the role of CRNA in patient's perioperative care. Patient understands. Discussed potential arterial line.)        Anesthesia Quick Evaluation

## 2022-03-28 ENCOUNTER — Encounter: Payer: Self-pay | Admitting: Surgery

## 2022-03-28 DIAGNOSIS — T8143XA Infection following a procedure, organ and space surgical site, initial encounter: Secondary | ICD-10-CM | POA: Diagnosis not present

## 2022-03-28 DIAGNOSIS — A419 Sepsis, unspecified organism: Secondary | ICD-10-CM | POA: Diagnosis not present

## 2022-03-28 DIAGNOSIS — N739 Female pelvic inflammatory disease, unspecified: Secondary | ICD-10-CM | POA: Diagnosis not present

## 2022-03-28 DIAGNOSIS — N179 Acute kidney failure, unspecified: Secondary | ICD-10-CM | POA: Diagnosis not present

## 2022-03-28 LAB — COMPREHENSIVE METABOLIC PANEL
ALT: 19 U/L (ref 0–44)
AST: 31 U/L (ref 15–41)
Albumin: 2.1 g/dL — ABNORMAL LOW (ref 3.5–5.0)
Alkaline Phosphatase: 44 U/L (ref 38–126)
Anion gap: 6 (ref 5–15)
BUN: 10 mg/dL (ref 6–20)
CO2: 26 mmol/L (ref 22–32)
Calcium: 7.3 mg/dL — ABNORMAL LOW (ref 8.9–10.3)
Chloride: 109 mmol/L (ref 98–111)
Creatinine, Ser: 0.5 mg/dL (ref 0.44–1.00)
GFR, Estimated: >60 mL/min (ref >60)
Glucose, Bld: 221 mg/dL — ABNORMAL HIGH (ref 70–99)
Potassium: 3.7 mmol/L (ref 3.5–5.1)
Sodium: 141 mmol/L (ref 135–145)
Total Bilirubin: 2.8 mg/dL — ABNORMAL HIGH (ref 0.3–1.2)
Total Protein: 4.4 g/dL — ABNORMAL LOW (ref 6.5–8.1)

## 2022-03-28 LAB — CBC
HCT: 32.8 % — ABNORMAL LOW (ref 36.0–46.0)
Hemoglobin: 10.5 g/dL — ABNORMAL LOW (ref 12.0–15.0)
MCH: 28.6 pg (ref 26.0–34.0)
MCHC: 32 g/dL (ref 30.0–36.0)
MCV: 89.4 fL (ref 80.0–100.0)
Platelets: 183 10*3/uL (ref 150–400)
RBC: 3.67 MIL/uL — ABNORMAL LOW (ref 3.87–5.11)
RDW: 14.1 % (ref 11.5–15.5)
WBC: 22 10*3/uL — ABNORMAL HIGH (ref 4.0–10.5)
nRBC: 0.3 % — ABNORMAL HIGH (ref 0.0–0.2)

## 2022-03-28 LAB — GLUCOSE, CAPILLARY
Glucose-Capillary: 217 mg/dL — ABNORMAL HIGH (ref 70–99)
Glucose-Capillary: 187 mg/dL — ABNORMAL HIGH (ref 70–99)
Glucose-Capillary: 153 mg/dL — ABNORMAL HIGH (ref 70–99)
Glucose-Capillary: 139 mg/dL — ABNORMAL HIGH (ref 70–99)

## 2022-03-28 LAB — MAGNESIUM: Magnesium: 2 mg/dL (ref 1.7–2.4)

## 2022-03-28 LAB — TRIGLYCERIDES: Triglycerides: 298 mg/dL — ABNORMAL HIGH (ref <150)

## 2022-03-28 LAB — PHOSPHORUS: Phosphorus: 2.7 mg/dL (ref 2.5–4.6)

## 2022-03-28 MED ORDER — TRACE MINERALS CU-MN-SE-ZN 300-55-60-3000 MCG/ML IV SOLN
INTRAVENOUS | Status: AC
  Filled 2022-03-28: qty 720

## 2022-03-28 MED ORDER — DIPHENHYDRAMINE HCL 50 MG/ML IJ SOLN
25.0000 mg | Freq: Three times a day (TID) | INTRAMUSCULAR | Status: DC | PRN
Start: 2022-03-28 — End: 2022-03-28

## 2022-03-28 MED ORDER — HYDROMORPHONE HCL 1 MG/ML IJ SOLN
0.5000 mg | INTRAMUSCULAR | Status: DC | PRN
  Administered 2022-03-28 – 2022-04-03 (×22): 1 mg via INTRAVENOUS
  Filled 2022-03-28 (×22): qty 1

## 2022-03-28 MED ORDER — DIPHENHYDRAMINE HCL 50 MG/ML IJ SOLN
50.0000 mg | Freq: Once | INTRAMUSCULAR | Status: AC
  Administered 2022-03-28: 50 mg via INTRAVENOUS
  Filled 2022-03-28: qty 1

## 2022-03-28 MED ORDER — INSULIN GLARGINE-YFGN 100 UNIT/ML ~~LOC~~ SOLN
10.0000 [IU] | Freq: Every day | SUBCUTANEOUS | Status: DC
  Administered 2022-03-28 – 2022-04-05 (×9): 10 [IU] via SUBCUTANEOUS
  Filled 2022-03-28 (×12): qty 0.1

## 2022-03-28 MED ORDER — LACTATED RINGERS IV SOLN: INTRAVENOUS | Status: DC

## 2022-03-28 MED ORDER — DIPHENHYDRAMINE HCL 50 MG/ML IJ SOLN
25.0000 mg | Freq: Three times a day (TID) | INTRAMUSCULAR | Status: DC | PRN
  Administered 2022-03-28 – 2022-04-08 (×5): 25 mg via INTRAVENOUS
  Filled 2022-03-28 (×6): qty 1

## 2022-03-28 NOTE — Anesthesia Postprocedure Evaluation (Signed)
Anesthesia Post Note  Patient: Lacey Harrison  Procedure(s) Performed: EXPLORATORY LAPAROTOMY SIGMOID COLECTOMY WITH COLOSTOMY CREATION/HARTMANN PROCEDURE; LYSIS OF ADHESIONS (Abdomen)  Patient location during evaluation: PACU Anesthesia Type: General Level of consciousness: awake and alert Pain management: pain level controlled Vital Signs Assessment: post-procedure vital signs reviewed and stable Respiratory status: spontaneous breathing, nonlabored ventilation, respiratory function stable and patient connected to nasal cannula oxygen Cardiovascular status: blood pressure returned to baseline and stable Postop Assessment: no apparent nausea or vomiting Anesthetic complications: no   No notable events documented.   Last Vitals:  Vitals:   03/27/22 2300 03/28/22 0000  BP: 122/71 118/68  Pulse: 87 93  Resp: 19 20  Temp:  37.2 C  SpO2: 98% 96%    Last Pain:  Vitals:   03/28/22 0000  TempSrc: Oral  PainSc:                  [name]

## 2022-03-28 NOTE — Inpatient Diabetes Management (Signed)
Inpatient Diabetes Program Recommendations  AACE/ADA: New Consensus Statement on Inpatient Glycemic Control (2015)  Target Ranges:  Prepandial:   less than 140 mg/dL      Peak postprandial:   less than 180 mg/dL (1-2 hours)      Critically ill patients:  140 - 180 mg/dL   Lab Results  Component Value Date   GLUCAP 217 (H) 03/28/2022   HGBA1C 5.1 01/31/2021    Review of Glycemic Control  Latest Reference Range & Units 03/27/22 18:33 03/27/22 23:39 03/28/22 05:56  Glucose-Capillary 70 - 99 mg/dL 215 (H) 228 (H) 217 (H)  (H): Data is abnormally high Diabetes history: No Dm hx noted Outpatient Diabetes medications: none Current orders for Inpatient glycemic control: Novolog 0-15 units Q6H Decadron 5 mg x 1  Inpatient Diabetes Program Recommendations:    Consider increasing correction to Novolog 0-15 units Q4h.   Thanks, Bronson Curb, MSN, RNC-OB Diabetes Coordinator [phone] (8a-5p)

## 2022-03-28 NOTE — Consult Note (Signed)
[facility] Nurse ostomy consult note: POD 1  Initial contact with patient, mother-in-law. Patient is very drowsy and only opens eyes for a few moments during my encounter. She does however smile and nod her head before returning to sleep.  Education folder taken to room along with a few sets of ostomy supplies and a stoma measuring guide.The supplies will likely need to be changed as the stoma is on the smaller side (<1 and 1/8 inches round) and supplies are 2 and 3/4 inch.  Stoma is dusky in appearance (also noted by surgical PA-C, Z. Delena Bali this am) and may be slightly retracted. No function. [place] Nurse will see tomorrow. Husband to be present at [place] for initial teaching session. [facility] Nurse M. Liane Comber will see in my absence.  [facility] nursing team will follow, and will remain available to this patient, the nursing, surgical, and medical teams.    Thank you for inviting [place] to participate in this patient's Plan of Care.  Maudie Flakes, MSN, RN, CNS, [place], Serita Grammes, [employer], [employer] phone:  [phone]

## 2022-03-28 NOTE — Progress Notes (Signed)
OT Cancellation Note  Patient Details Name: BRYNNAN RODENBAUGH MRN: [id number] DOB: 05-Dec-1985   Cancelled Treatment:    Reason Eval/Treat Not Completed: Other (comment) (per surgery note, plan is to mobilize patient tomorrow 03/29/22. OT will attempt to see pt for evaluation when cleared to mobilize on 03/29/22)  Shanon Payor, OTD OTR/L  03/28/22, 2:54 PM

## 2022-03-28 NOTE — Progress Notes (Signed)
Date of Admission:  03/21/2022    ID: Lacey Harrison is a 36 y.o. female  Principal Problem:   Postoperative intra-abdominal abscess Active Problems:   Acute respiratory failure with hypoxia (HCC) in seting of MSKpain, atelectasis, severe sepsis    Severe sepsis ([facility]) without septic shock, d/t postoperative abscess, s/p pelvic drain placement    Ileus, postoperative (HCC)   AKI (acute kidney injury) ([place]) in setting of sepsis, IV contrast, NAGMA - RESOLVED    Abdominal pain    Subjective: Pt is awake Says she is feeling better than beofre Still has lot of abdominal pain Underwent exp lap with sigmoid colectomy/colostomy for fecal peritonitis due to colon perforation/adhesions  Medications:   Chlorhexidine Gluconate Cloth  6 each Topical Daily   heparin injection (subcutaneous)  5,000 Units Subcutaneous Q8H   insulin aspart  0-15 Units Subcutaneous Q6H   insulin glargine-yfgn  10 Units Subcutaneous QHS   ketorolac  30 mg Intravenous Q6H   pantoprazole (PROTONIX) IV  40 mg Intravenous QHS   sodium chloride flush  10-40 mL Intracatheter Q12H   sodium chloride flush  5 mL Intracatheter Q8H    Objective: Vital signs in last 24 hours: Temp:  [97.9 F (36.6 C)-98.9 F (37.2 C)] 98.1 F (36.7 C) (08/03 2020) Pulse Rate:  [79-96] 94 (08/03 2100) Resp:  [13-23] 18 (08/03 2100) BP: (105-123)/(55-68) 123/60 (08/03 2000) SpO2:  [95 %-100 %] 100 % (08/03 2100)  LDA Rt PICc Foley NG ube  PHYSICAL EXAM:  General: awake Alert, cooperative, some  distress,  Head: Normocephalic, without obvious abnormality, atraumatic. Eyes: Conjunctivae clear, anicteric sclerae. Pupils are equal ENT Nares normal. No drainage or sinus tenderness. Lips, mucosa, and tongue normal. No Thrush Neck: , symmetrical, no adenopathy, thyroid: non tender no carotid bruit and no JVD. Back: did not examine Lungs: b/l air entry. Heart: Tachycardia Abdomen: Soft, lap site covered with dressing- stoma dark  red Jp drain Extremities: edema arms and legs Skin: No rashes or lesions. Or bruising Lymph: Cervical, supraclavicular normal. Neurologic: Grossly non-focal  Lab Results Recent Labs    03/27/22 0747 03/27/22 2009 03/28/22 0512  WBC 17.5*  --  22.0*  HGB 10.5*  --  10.5*  HCT 32.2*  --  32.8*  NA 140  --  141  K 2.9* 3.5 3.7  CL 105  --  109  CO2 30  --  26  BUN 12  --  10  CREATININE 0.51  --  0.50   Liver Panel Recent Labs    03/27/22 0747 03/28/22 0512  PROT 5.3* 4.4*  ALBUMIN 2.2* 2.1*  AST 34 31  ALT 17 19  ALKPHOS 56 44  BILITOT 4.5* 2.8*    Microbiology:  ABUNDANT WBC PRESENT,BOTH PMN AND MONONUCLEAR  ABUNDANT GRAM NEGATIVE RODS  FEW GRAM POSITIVE RODS  FEW GRAM POSITIVE COCCI IN PAIRS  FEW BUDDING YEAST SEEN   Culture ABUNDANT ESCHERICHIA COLI  MODERATE CLOSTRIDIUM PERFRINGENS  Standardized susceptibility testing for this organism is not available.  ABUNDANT BACTEROIDES VULGATUS  BETA LACTAMASE NEGATIVE    Studies/Results: DG Abd Portable 2V  Result Date: 03/27/2022 CLINICAL DATA:  Abdominal pain. History of postoperative pelvic abscess and sigmoid colon perforation. EXAM: PORTABLE ABDOMEN - 2 VIEW COMPARISON:  Multiple previous CT scans. Contrast enema study from yesterday. FINDINGS: The NG tube remains in good position. There is contrast noted throughout the colon from the study yesterday. Large collection of contrast in the pelvis in the region of the  abscess drainage catheter consistent with the sigmoid colon leak seen yesterday. IMPRESSION: Large collection of contrast in the pelvis surrounding the pelvic drainage catheter in the region of the sigmoid colon leak seen yesterday. Electronically Signed   By: Marijo Sanes M.D.   On: 03/27/2022 08:42     Assessment/Plan: 36 year old female with recent robotic lap for adhesiolysis and left salpingectomy and drainage of the left hydrosalpinx peritoneal cyst which was complicated by pelvic abscess secondary  to sigmoid perforation Initially underwent pelvic drain placement by IR.  Multiple organisms of colon origin and culture.  E. coli, Bacteroides, Clostridium perfringens and yeast.  She has been on Unasyn and fluconazole Patient underwent exploratory laparotomy and sigmoid colon resection with colostomy. Also has a JP drain Fecal peritonitis was present  Fever has resolved Increasing leukocytosis could be a stress response to surgery If it continues to increase then we have to think of changing the antibiotics from Unasyn to meropenem. But overall patient is doing better  AKI has resolved  Septic shock has resolved  Anemia  Hyperbilirubinemia secondary to septic shock is improving  Discussed the management with the patient and her husband at bedside Also discussed with her nurse [facility] is covering on Friday, Saturday and Sunday.   Dr. Baxter Flattery is  available by phone for urgent issue.

## 2022-03-28 NOTE — Progress Notes (Signed)
[facility] Day(s): 7.   Post op day(s): 1 Day Post-Op.   Interval History:  Patient seen and examined No acute events or new complaints overnight.  She is hemodynamically stable; she has not required vasopressor support Patient reports abdominal soreness expectedly but states it is better than yesterday No fever, chills Leukocytosis slightly worse this morning; 22.0K; likely reactive from surgery  Hgb to 10.5; stable; dilutional Renal function normal; sCr - 0.50; UO - 995 ccs No electrolyte derangements NGT with 50 ccs out Surgical drain with 285 ccs; serous No ostomy function   Vital signs in last 24 hours: [min-max] current  Temp:  [97.7 F (36.5 C)-99 F (37.2 C)] 97.9 F (36.6 C) (08/03 0700) Pulse Rate:  [85-111] 85 (08/03 0700) Resp:  [14-25] 19 (08/03 0700) BP: (94-125)/(59-107) 121/65 (08/03 0700) SpO2:  [92 %-98 %] 97 % (08/03 0700)     Height: '5\' 3"'$  (160 cm) Weight: 101 kg BMI (Calculated): 39.44   Intake/Output last 2 shifts:  08/02 0701 - 08/03 0700 In: 4561.9 [I.V.:3756.8; IV Piggyback:800] Out: 1430 [Urine:995; Emesis/NG output:50; Drains:285; Blood:100]   Physical Exam:  Constitutional: alert, cooperative and no distress  HEENT: NGT in place; minimal output  Respiratory: breathing non-labored at rest  Cardiovascular: regular rate and sinus rhythm  Gastrointestinal: Soft, expected tenderness, non-distended, no rebound/guarding. Surgical drain in LLQ; output serous. 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; dusky, no output Integumentary: Midline wound healing via secondary intention   Labs:     Latest Ref Rng & Units 03/28/2022    5:12 AM 03/27/2022    7:47 AM 03/26/2022    6:22 AM  CBC  WBC 4.0 - 10.5 K/uL 22.0  17.5  10.8   Hemoglobin 12.0 - 15.0 g/dL 10.5  10.5  11.1   Hematocrit 36.0 - 46.0 % 32.8  32.2  33.0   Platelets 150 - 400 K/uL 183  161  165       Latest Ref Rng & Units 03/28/2022    5:12 AM 03/27/2022     8:09 PM 03/27/2022    7:47 AM  CMP  Glucose 70 - 99 mg/dL 221   126   BUN 6 - 20 mg/dL 10   12   Creatinine 0.44 - 1.00 mg/dL 0.50   0.51   Sodium 135 - 145 mmol/L 141   140   Potassium 3.5 - 5.1 mmol/L 3.7  3.5  2.9   Chloride 98 - 111 mmol/L 109   105   CO2 22 - 32 mmol/L 26   30   Calcium 8.9 - 10.3 mg/dL 7.3   7.8   Total Protein 6.5 - 8.1 g/dL 4.4   5.3   Total Bilirubin 0.3 - 1.2 mg/dL 2.8   4.5   Alkaline Phos 38 - 126 U/L 44   56   AST 15 - 41 U/L 31   34   ALT 0 - 44 U/L 19   17      Imaging studies: No new pertinent imaging studies   Assessment/Plan:  36 y.o. female awaiting ROBF otherwise doing well 1 Day Post-Op s/p exploratory laparotomy and Hartman's procedure for perforated sigmoid colon.   - Continue NPO (okay for ice chips for comfort) + TPN; monitor electrolytes; normalized - Continue NGT decompression; LIS; monitor and record output    - Monitor colostomy function; this will likely get dusky; will engage WOC RN   - Wound Care: Wet to dry dressing changes daily  with saline moistened Kerlix; cover and secure. As we get closer to discharge, I will see if she can get a wound vac for home.   - Continue surgical drain; monitor and record output - Continue foley catheter today; monitor UO. She understands I will get this out tomorrow   - Monitor abdominal examination - Pain control scheduled + prn; antiemetics prn - Monitor leukocytosis; worse today likely secondary to surgery - Plan to mobilize tomorrow; will likely need PT - Further management per primary service; she can transfer to floor   All of the above findings and recommendations were discussed with the patient, patient's family (mother at bedside), and the medical team, and all of patient's and family's questions were answered to their expressed satisfaction.  -- [name], PA-C [facility] 03/28/2022, 7:27 AM M-F: 7am - 4pm

## 2022-03-28 NOTE — Consult Note (Signed)
PHARMACY - TOTAL PARENTERAL NUTRITION CONSULT NOTE   Indication: Prolonged ileus  Patient Measurements: Height: '5\' 3"'$  (160 cm) Weight: 101 kg (222 lb 9.6 oz) IBW/kg (Calculated) : 52.4 TPN AdjBW (KG): 62.1 Body mass index is 39.43 kg/m.  Assessment:  Patient is [name] with medical history including recent laparoscopic adhesiolysis, left salpingectomy, drainage of peritoneal cyst, and chromotubation on 7/24 who presented to the ED 7/27 with severe pain in abdomen and vagina. Imaging concerning for post-operative abscess. Patient underwent CT drain placement into pelvic abscess on 7/28. Hospital course has been complicated by ileus. Pharmacy consulted to initiate TPN to provide nutrition given post-operative ileus.   Glucose / Insulin: BG 217-221, requiring 10 units insulin aspart in the past 24 hours Electrolytes: WNL Renal: Scr < 1 Hepatic: No transaminitis. Hyperbilirubinemia Intake / Output; MIVF: I&O: + 11L. Normal Saline at 50 ml/hr GI Imaging: 7/31 CT abdomen / pelvis: Percutaneous drainage catheter is seen in left-sided abdominal abscess. Mild proximal small bowel dilatation is noted which is slightly improved compared to prior exam, most consistent with ileus. Stable mild ascites is noted in the pelvis and right side of the abdomen. GI Surgeries / Procedures:  7/28: CT drain placement into pelvic abscess 8/2: Pending ExLap with general surgery  Central access: PICC Double Lumen placed 8/1 TPN start date: 8/1   Nutritional Goals: Goal TPN rate is 75 mL/hr (provides 108 g of protein and 2217 kcals per day)  RD Assessment: Estimated Needs Total Energy Estimated Needs: 2000-2300kcal/day Total Protein Estimated Needs: 100-115g/day Total Fluid Estimated Needs: 1.6-1.8L/day  Current Nutrition:  NPO  Plan:  --Continue TPN at 75 mL/hr (goal rate)  --Discontinue MIVF --Electrolytes in TPN: Na 2mq/L, K 578m/L, Ca 32m59mL, Mg 32mE41m, and Phos 132mm71m. Cl:Ac 1:1 --Add  standard MVI and trace elements to TPN, add thiamine 100 mg x 3 days (day # 3 / 3) --Continue Moderate q6h SSI and adjust as needed  --Monitor TPN labs on Mon/Thurs, daily until stable  [employer]2023,8:03 AM

## 2022-03-28 NOTE — Progress Notes (Addendum)
Nutrition Follow Up Note   DOCUMENTATION CODES:   Obesity unspecified  INTERVENTION:   Continue TPN per pharmacy- provides 2217kcal/day and 108g/day protein    Daily weights   Recommend discontinuation of IVF  NUTRITION DIAGNOSIS:   Inadequate oral intake related to acute illness as evidenced by NPO status.  GOAL:   Patient will meet greater than or equal to 90% of their needs -progressing with TPN  MONITOR:   Diet advancement, Labs, Weight trends, Skin, I & O's, Other (Comment) (TPN)  ASSESSMENT:   36 y.o. G64P0010 female with h/o anxiety, depression, lyme's disease, migraines, infertility, pelvic pain, left hydrosalpinx, dermoid cyst s/p ovarian cystectomy with an incidental cystotomy and repair (2007), s/p laparoscopy with extensive abdominopelvic adhesiolysis  with chromopertubation (01/2018), endometriosis and s/p robotic-assisted laparoscopic adhesiolysis, left salpingectomy, drainage of peritoneal cyst, and chromotubation (04/19/04) complicated by pelvic abscess s/p IR drain, AKI, sepsis, bowel perforation s/p Hartmanns 8/2 and post op ileus.  -Pt s/p IR guided 30F lower abdominopelvic percutaneous drain 7/28 - Pt s/p ex lap with LOA and Hartmann's procedure 8/2  Pt tolerating TPN well at goal rate. Pt is refeeding; electrolytes being monitored by pharmacy. Triglycerides remain elevated but stable. Blood glucoses slightly elevated; Semglee added today and will plan for insulin in TPN tomorrow if no improvement. NGT remain in place with 38m output. Drain with 2811moutput. Pt with open surgical wound with wet to dry bandage; pt may require VAC prior to discharge. Pt reports some abdominal soreness today but reports this is improved from prior to surgery. No bowel function in ostomy yet. Recommend continue TPN until pt is able to take in substantial oral intake. RD will add supplements with diet advancement. Per chart, pt is up ~22lbs from her UBW; pt +11.3L on her I & Os.  Recommend discontinue IVF; this was discussed with pharmacy.   Medications reviewed and include: heparin, insulin, protonix, unasyn, diflucan, LRS '@50ml'$ /hr   Labs reviewed: K 3.7 wnl, P 2.7 wnl, Mg 2.0 wnl Triglycerides- 298(H) Wbc- 22.0(H), Hgb 10.5(L), Hct 32.8(L) Cbgs- 217, 228, 215, 135, 124 x 24 hrs  Diet Order:    Diet Order             Diet NPO time specified Except for: Ice Chips  Diet effective now                  EDUCATION NEEDS:   Education needs have been addressed  Skin:  Skin Assessment: Reviewed RN Assessment (open surgical incision)  Last BM:  7/27  Height:   Ht Readings from Last 1 Encounters:  03/21/22 '5\' 3"'$  (1.6 m)    Weight:   Wt Readings from Last 1 Encounters:  03/27/22 101 kg    Ideal Body Weight:  52.2 kg  BMI:  Body mass index is 39.43 kg/m.  Estimated Nutritional Needs:   Kcal:  2000-2300kcal/day  Protein:  100-115g/day  Fluid:  1.6-1.8L/day  CaKoleen DistanceS, RD, LDN Please refer to [facility] and/or RD on-call/weekend/after hours pager

## 2022-03-28 NOTE — TOC Progression Note (Addendum)
Transition of Care [facility]) - Progression Note    Patient Details  Name: Lacey Harrison MRN: [id number] Date of Birth: 07-13-1986  Transition of [facility]) CM/SW Contact  Shelbie Hutching, RN Phone Number: 03/28/2022, 3:15 PM  Clinical Narrative:    RNCM stopped by to see patient this afternoon.  Patient resting with eyes closed, friend is at the bedside.  Patient does wake up but still drowsy.   S/P ex lap with colectomy and end colostomy.  NG tube to wall suction.  Patient feels things are progressing in the right direction.    TOC will cont to follow.    Expected Discharge Plan:  (TBD) Barriers to Discharge: Continued Medical Work up  Expected Discharge Plan and Services Expected Discharge Plan:  (TBD)       Living arrangements for the past 2 months: Single Family Home                                       Social Determinants of Health (SDOH) Interventions    Readmission Risk Interventions     No data to display

## 2022-03-28 NOTE — Progress Notes (Signed)
PROGRESS NOTE    Lacey Harrison  XFG:[id number] DOB: 1986-07-12 DOA: 03/21/2022 PCP: Rubie Maid, MD   Brief Narrative:  This 36 yo Female presenting to [facility] ED from home on 03/21/22 with complaints of sudden onset severe abdominal pain. She recently on 03/18/22 had robotic adhesiolysis, left salpingectomy, drainage of 500 mL from large LEFT peritoneal cyst & chromotubation. She was discharged home on 9/67 without complication. She reported being on the floor with her cat and twisting her body around, with sudden acute abdominal pain. Patient complained of severe pain in lower abdomen radiating towards her vagina.  07/27: CT imaging abscess. Leukocytosis with left shift at 16.8. Initiated on IV Zosyn and Doxycycline. Admitted to OBGYN.  07/28: IR procedure 07/28 CT drain placement into pelvic abscess, left anterior approach, 12 Fr to bulb suction. Patient developed severe sepsis/septic shock after drainage of abdominal pelvic abscess.  She required transfer to stepdown unit, dyspnea worsened with desaturations noted, due to concerns for respiratory status PCCM consulted, receiving BS abx w/ zosyn, doxycycline & vancomycin, volume resuscitation w/ IVF, awaiting culture results   07/29: Remains in SDU w/ ileus and sepsis. IV antibiotics: Continue Zosyn, added fluconazole due to budding yeast noted on abscess fluid, Discontinue doxycycline/vancomycin x1, continue IVF hydration - bicarb infusion, DC other IV fluids due to anasarca. Developing ileus, query feculent appearance on abscess drain, repeat CT abdomen pelvis performed, new loculations on the right, anasarca, distended urinary bladder. No contrast extravasation to suggest bowel perforation 07/30: Continues to have abdominal pain, required NG last night due to recurrent vomiting.  Had 1200 mL of bilious material suctioned from stomach.  NG in place, abdomen less distended. IV antibiotics: Continue Zosyn, fluconazole, added Clindamycin d/t clostridium  prefringens noted on culture.  07/31: repeat CT The abscess is slightly decreased compared to prior exam. Continuing on Unasyn and Diflucan.  08/01: VSS, WBC trending down, BCx NGx4d. Ileus no better. Drain in place, minimal output. NG in place. PICC and TPN initiated. Spoke w/ GYN, plan for surgical consult and ID consult. Pt is in significant pain though lab/VS parameters and imaging show improvement, suspect pain more d/t ileus. 8/02: Patient continued to have persistently elevated white cell count with abdominal pain.  General surgery has scheduled exploratory laparotomy. 8/03: Postoperative day 1.  Exploratory laparotomy, sigmoid colectomy with colostomy creation, Hartmann procedure, lysis of additions.  Continue TPN and IV antibiotics.  Assessment & Plan:   Principal Problem:   Postoperative intra-abdominal abscess Active Problems:   Acute respiratory failure with hypoxia (HCC) in seting of MSKpain, atelectasis, severe sepsis    Severe sepsis (HCC) without septic shock, d/t postoperative abscess, s/p pelvic drain placement    Ileus, postoperative (HCC)   AKI (acute kidney injury) ([place]) in setting of sepsis, IV contrast, NAGMA - RESOLVED    Abdominal pain   Severe sepsis ([place]) without septic shock, d/t postoperative abscess, s/p pelvic drain placement : Cultures growing budding yeast, EColi and Clostridium --> Continue IV abx: Unasyn and Diflucan Abscess shows some improvement on CTon 07/30 --> repeat imaging prn worsening  Monitor CBC and VS for sepsis parameters --> no improvement. Patient underwent  Exploratory laparotomy, sigmoid colectomy with colostomy creation, Hartmann procedure, lysis of additions. POD # 1.   Acute respiratory failure with hypoxia (HCC) in seting of MSKpain, atelectasis, severe sepsis  Continue Supplemental O2 prn goal >90% Continue bronchodilators  Incentive spirometry    AKI (acute kidney injury) ([place]) in setting of sepsis, IV contrast, NAGMA - RESOLVED  Trend BMP I&O close monitor UOP   Ileus, postoperative (HCC) NG in place PICC and TPN placed on 03/26/22  Po diet as tolerated  General surgery following Postoperative day 1.       Exploratory laparotomy, sigmoid colectomy with colostomy creation, Hartmann procedure, lysis of additions.   Postoperative intra-abdominal abscess GYN following ID and general surgery consulted Leukocytosis worsening, she underwent exploratory laparotomy.   Abdominal pain Suspect related more to ileus since infection parameters are improving Discussed would like to avoid opiates if possible as this will not improve ileus Trial switching morphine to Dilaudid low-dose today Patient encouraged out of bed/walking   Hypokalemia: Replaced.  Continue to monitor  DVT prophylaxis: heparin Code Status: Full code. Family Communication: Family/ mother at bed side. Disposition Plan:  Pending improvement in ileus, needing exploratory laparotomy today.  Consultants:  PCCM OB/GYN IR General surgery Infectious diseases  Procedures:  07/28 CT drain placement into pelvic abscess 07/30 NG tube placed  07/31 PICC line.  Antimicrobials:   Anti-infectives (From admission, onward)    Start     Dose/Rate Route Frequency Ordered Stop   03/25/22 1600  Ampicillin-Sulbactam (UNASYN) 3 g in sodium chloride 0.9 % 100 mL IVPB        3 g 200 mL/hr over 30 Minutes Intravenous Every 6 hours 03/25/22 1134     03/24/22 2200  clindamycin (CLEOCIN) IVPB 900 mg  Status:  Discontinued        900 mg 100 mL/hr over 30 Minutes Intravenous Every 8 hours 03/24/22 1854 03/25/22 1134   03/23/22 1500  fluconazole (DIFLUCAN) IVPB 400 mg        400 mg 100 mL/hr over 120 Minutes Intravenous Every 24 hours 03/23/22 1358     03/22/22 1851  vancomycin variable dose per unstable renal function (pharmacist dosing)  Status:  Discontinued         Does not apply See admin instructions 03/22/22 1851 03/23/22 1051   03/22/22 1645   vancomycin (VANCOCIN) IVPB 1000 mg/200 mL premix  Status:  Discontinued        1,000 mg 200 mL/hr over 60 Minutes Intravenous  Once 03/22/22 1553 03/22/22 1558   03/22/22 1645  vancomycin (VANCOREADY) IVPB 2000 mg/400 mL        2,000 mg 200 mL/hr over 120 Minutes Intravenous  Once 03/22/22 1558 03/22/22 1814   03/22/22 0400  piperacillin-tazobactam (ZOSYN) IVPB 3.375 g  Status:  Discontinued        3.375 g 12.5 mL/hr over 240 Minutes Intravenous Every 8 hours 03/21/22 2047 03/25/22 1134   03/21/22 2200  doxycycline (VIBRAMYCIN) 100 mg in sodium chloride 0.9 % 250 mL IVPB  Status:  Discontinued        100 mg 125 mL/hr over 120 Minutes Intravenous Every 12 hours 03/21/22 2047 03/23/22 1358   03/21/22 1900  piperacillin-tazobactam (ZOSYN) IVPB 3.375 g        3.375 g 12.5 mL/hr over 240 Minutes Intravenous STAT 03/21/22 1853 03/21/22 2300        Subjective: Patient was seen and examined at bedside.  Overnight events noted.   Patient reports feeling slightly better. She underwent exploratory laparotomy with colostomy creation. POD # 1. Patient remains on TPN.  She still has NG tube with low intermittent suction.  Objective: Vitals:   03/28/22 1000 03/28/22 1100 03/28/22 1200 03/28/22 1300  BP: (!) 105/55 (!) 111/59 108/60 (!) 110/56  Pulse: 93 95 87 85  Resp: '20 13 19 18  '$ Temp:  98.1 F (36.7 C)   TempSrc:      SpO2: 98% 95% 97% 97%  Weight:      Height:        Intake/Output Summary (Last 24 hours) at 03/28/2022 1410 Last data filed at 03/28/2022 1341 Gross per 24 hour  Intake 4561.88 ml  Output 1980 ml  Net 2581.88 ml   Filed Weights   03/21/22 1722 03/26/22 1800 03/27/22 0700  Weight: 91 kg 98 kg 101 kg    Examination:  General exam: Appears comfortable, not in any distress, NG tube connected with suction. Respiratory system: CTA bilaterally, respiratory effort normal.  No wheezing, no crackles. Cardiovascular system: S1 & S2 heard, regular rate and rhythm, no  murmur. Gastrointestinal system: Abdomen is soft, mildly tender, distended, colostomy noted with brownish stool.  Bowel sounds: Sluggish. Central nervous system: Alert and oriented X 3. No focal neurological deficits. Extremities: No edema, no cyanosis, no clubbing. Skin: No rashes, lesions or ulcers Psychiatry: Judgement and insight appear normal. Mood & affect appropriate.     Data Reviewed: I have personally reviewed following labs and imaging studies  CBC: Recent Labs  Lab 03/21/22 1744 03/22/22 0543 03/22/22 1048 03/23/22 0530 03/24/22 0447 03/25/22 0353 03/26/22 0622 03/27/22 0747 03/28/22 0512  WBC 16.8* 9.9 11.5*   < > 13.3* 13.0* 10.8* 17.5* 22.0*  NEUTROABS 14.5* 8.3* 10.0*  --   --   --  7.5  --   --   HGB 15.3* 14.7 13.8   < > 12.3 10.8* 11.1* 10.5* 10.5*  HCT 44.5 43.1 40.2   < > 36.4 32.3* 33.0* 32.2* 32.8*  MCV 84.3 85.2 84.6   < > 84.3 86.1 86.4 87.3 89.4  PLT 294 290 260   < > 213 189 165 161 183   < > = values in this interval not displayed.   Basic Metabolic Panel: Recent Labs  Lab 03/24/22 0447 03/25/22 0353 03/26/22 0622 03/27/22 0747 03/27/22 2009 03/28/22 0512  NA 133* 135 137 140  --  141  K 3.0* 3.0* 3.7 2.9* 3.5 3.7  CL 98 97* 102 105  --  109  CO2 '26 30 25 30  '$ --  26  GLUCOSE 119* 81 72 126*  --  221*  BUN '11 14 18 12  '$ --  10  CREATININE 0.68 0.82 0.53 0.51  --  0.50  CALCIUM 7.6* 7.6* 8.0* 7.8*  --  7.3*  MG 2.6* 2.4 2.3 1.9  --  2.0  PHOS 1.7* 2.2* 4.8* 3.2  --  2.7   GFR: Estimated Creatinine Clearance: 111.3 mL/min (by C-G formula based on SCr of 0.5 mg/dL). Liver Function Tests: Recent Labs  Lab 03/23/22 0431 03/24/22 0447 03/25/22 0353 03/27/22 0747 03/28/22 0512  AST '26 20 18 '$ 34 31  ALT '23 21 14 17 19  '$ ALKPHOS 41 62 49 56 44  BILITOT 4.0* 3.3* 3.4* 4.5* 2.8*  PROT 5.5* 5.6* 5.1* 5.3* 4.4*  ALBUMIN 2.6* 2.4* 2.1* 2.2* 2.1*   Recent Labs  Lab 03/21/22 1744  LIPASE 26   No results for input(s): "AMMONIA" in the  last 168 hours. Coagulation Profile: Recent Labs  Lab 03/22/22 1048  INR 1.3*   Cardiac Enzymes: No results for input(s): "CKTOTAL", "CKMB", "CKMBINDEX", "TROPONINI" in the last 168 hours. BNP (last 3 results) No results for input(s): "PROBNP" in the last 8760 hours. HbA1C: No results for input(s): "HGBA1C" in the last 72 hours. CBG: Recent Labs  Lab 03/27/22 0552 03/27/22 1833 03/27/22  2339 03/28/22 0556 03/28/22 1202  GLUCAP 135* 215* 228* 217* 187*   Lipid Profile: Recent Labs    03/27/22 0747 03/28/22 0512  TRIG 283* 298*   Thyroid Function Tests: No results for input(s): "TSH", "T4TOTAL", "FREET4", "T3FREE", "THYROIDAB" in the last 72 hours. Anemia Panel: No results for input(s): "VITAMINB12", "FOLATE", "FERRITIN", "TIBC", "IRON", "RETICCTPCT" in the last 72 hours. Sepsis Labs: Recent Labs  Lab 03/22/22 1403 03/22/22 2242 03/23/22 0431 03/24/22 0447 03/24/22 0908  PROCALCITON  --  27.66 27.17 16.44  --   LATICACIDVEN 4.9* 2.9* 2.5*  --  1.3    Recent Results (from the past 240 hour(s))  Culture, blood (x 2)     Status: None   Collection Time: 03/22/22 10:48 AM   Specimen: BLOOD  Result Value Ref Range Status   Specimen Description BLOOD BLOOD LEFT HAND  Final   Special Requests   Final    BOTTLES DRAWN AEROBIC AND ANAEROBIC Blood Culture adequate volume   Culture   Final    NO GROWTH 5 DAYS Performed at [facility], [address], [place], [place] [address]    Report Status 03/27/2022 FINAL  Final  Culture, blood (x 2)     Status: None   Collection Time: 03/22/22 11:00 AM   Specimen: BLOOD  Result Value Ref Range Status   Specimen Description BLOOD BLOOD RIGHT HAND  Final   Special Requests AEROBIC BOTTLE ONLY Blood Culture adequate volume  Final   Culture   Final    NO GROWTH 5 DAYS Performed at [facility], [address], [place], [place] [address]    Report Status 03/27/2022 FINAL  Final  Aerobic/Anaerobic Culture w  Gram Stain (surgical/deep wound)     Status: None   Collection Time: 03/22/22 12:57 PM   Specimen: Wound; Abscess  Result Value Ref Range Status   Specimen Description   Final    WOUND Performed at [facility], [address], [place], [place] [address]    Special Requests   Final    ABD ABSCESS Performed at [facility], [address] [place], [place] [address]    Gram Stain   Final    ABUNDANT WBC PRESENT,BOTH PMN AND MONONUCLEAR ABUNDANT GRAM NEGATIVE RODS FEW GRAM POSITIVE RODS FEW GRAM POSITIVE COCCI IN PAIRS FEW BUDDING YEAST SEEN    Culture   Final    ABUNDANT ESCHERICHIA COLI MODERATE CLOSTRIDIUM PERFRINGENS Standardized susceptibility testing for this organism is not available. ABUNDANT BACTEROIDES VULGATUS BETA LACTAMASE NEGATIVE Performed at [facility], [place] [address] [place], [place] [address]    Report Status 03/25/2022 FINAL  Final   Organism ID, Bacteria ESCHERICHIA COLI  Final      Susceptibility   Escherichia coli - MIC*    AMPICILLIN 8 SENSITIVE Sensitive     CEFAZOLIN <=4 SENSITIVE Sensitive     CEFEPIME <=0.12 SENSITIVE Sensitive     CEFTAZIDIME <=1 SENSITIVE Sensitive     CEFTRIAXONE <=0.25 SENSITIVE Sensitive     CIPROFLOXACIN <=0.25 SENSITIVE Sensitive     GENTAMICIN <=1 SENSITIVE Sensitive     IMIPENEM <=0.25 SENSITIVE Sensitive     TRIMETH/SULFA <=20 SENSITIVE Sensitive     AMPICILLIN/SULBACTAM <=2 SENSITIVE Sensitive     PIP/TAZO <=4 SENSITIVE Sensitive     * ABUNDANT ESCHERICHIA COLI  MRSA Next Gen by PCR, Nasal     Status: None   Collection Time: 03/22/22 11:00 PM   Specimen: Nasal Mucosa; Nasal Swab  Result Value Ref Range Status   MRSA by PCR Next Gen NOT DETECTED NOT DETECTED Final    Comment: (NOTE) The GeneXpert MRSA Assay (FDA approved for NASAL specimens only), is one component of a comprehensive MRSA colonization surveillance program. It is not intended to diagnose MRSA infection nor to guide or  monitor treatment for MRSA infections. Test performance is not FDA approved in patients less than 62 years old. Performed at [facility], [address], [place], [place] [address]   Urine Culture     Status: None   Collection Time: 03/24/22  6:02 PM   Specimen: Urine, Catheterized  Result Value Ref Range Status   Specimen Description   Final    URINE, CATHETERIZED Performed at [facility], [address] [place] [address], [place], [place] [address]    Special Requests   Final    Normal Performed at [facility], [address] [place], [place] [address]    Culture   Final    NO GROWTH Performed at [facility], [place] [address], [place], [place] [address]    Report Status 03/25/2022 FINAL  Final   Radiology Studies: DG Abd Portable 2V  Result Date: 03/27/2022 CLINICAL DATA:  Abdominal pain. History of postoperative pelvic abscess and sigmoid colon perforation. EXAM: PORTABLE ABDOMEN - 2 VIEW COMPARISON:  Multiple previous CT scans. Contrast enema study from yesterday. FINDINGS: The NG tube remains in good position. There is contrast noted throughout the colon from the study yesterday. Large collection of contrast in the pelvis in the region of the abscess drainage catheter consistent with the sigmoid colon leak seen yesterday. IMPRESSION: Large collection of contrast in the pelvis surrounding the pelvic drainage catheter in the region of the sigmoid colon leak seen yesterday. Electronically Signed   By: Marijo Sanes M.D.   On: 03/27/2022 08:42   DG BE (COLON)W SINGLE CM (SOL OR THIN BA)  Result Date: 03/26/2022 CLINICAL DATA:  Recent pelvic/gyn surgery. Postoperative pelvic abscess. Status post percutaneous drain. Request for barium enema study to evaluate for possible recto-sigmoid perforation. EXAM: WATER-SOLUBLE CONTRAST ENEMA TECHNIQUE: Initial scout AP supine abdominal image obtained to insure adequate colon cleansing. Water-soluble contrast was introduced into  the colon in a retrograde fashion and refluxed from the rectum to the cecum. Spot images of the colon followed by overhead radiographs were obtained. FLUOROSCOPY: Radiation Exposure Index (as provided by the fluoroscopic device): 102.40 mGy Kerma CONTRAST:  450 CC OF OMNIPAQUE 300 COMPARISON:  CT abdomen and pelvis from 03/25/2022 FINDINGS: Contrast is seen promptly filling the rectum as well as the sigmoid and descending colon. Contrast ultimately reaches the cecum without difficulty. There appears to be extraluminal contrast extravasation at the region of the sigmoid colon. Exact location is difficult to visualize. However there does appear to be contrast pooling at the location of the percutaneous drain catheter. IMPRESSION: Positive findings of contrast extravasation from the sigmoid colon consistent with bowel injury/perforation. Contrast appears to be predominantly pooling within the abscess cavity where percutaneous drainage catheter is presently in position. Read by: Ascencion Dike PA-C These results will be called to the ordering clinician or representative by the Radiologist Assistant, and communication documented in the PACS or [place]. Electronically Signed   By: Kerby Moors M.D.   On: 03/26/2022 17:03    Scheduled Meds:  Chlorhexidine Gluconate Cloth  6 each Topical Daily   heparin injection (subcutaneous)  5,000 Units Subcutaneous Q8H   insulin aspart  0-15 Units Subcutaneous Q6H  insulin glargine-yfgn  10 Units Subcutaneous QHS   ketorolac  30 mg Intravenous Q6H   pantoprazole (PROTONIX) IV  40 mg Intravenous QHS   sodium chloride flush  10-40 mL Intracatheter Q12H   sodium chloride flush  5 mL Intracatheter Q8H   Continuous Infusions:  sodium chloride 10 mL/hr at 03/26/22 1939   ampicillin-sulbactam (UNASYN) IV 3 g (03/28/22 0951)   fluconazole (DIFLUCAN) IV 400 mg (03/28/22 1341)   TPN ADULT (ION) 75 mL/hr at 03/28/22 0606   TPN ADULT (ION)       LOS: 7 days    Time  spent: 35 mins    Tacoma Merida, MD [facility]   If 7PM-7AM, please contact night-coverage

## 2022-03-29 ENCOUNTER — Inpatient Hospital Stay: Payer: No Typology Code available for payment source

## 2022-03-29 DIAGNOSIS — T8143XA Infection following a procedure, organ and space surgical site, initial encounter: Secondary | ICD-10-CM | POA: Diagnosis not present

## 2022-03-29 LAB — COMPREHENSIVE METABOLIC PANEL
ALT: 18 U/L (ref 0–44)
AST: 32 U/L (ref 15–41)
Albumin: 1.8 g/dL — ABNORMAL LOW (ref 3.5–5.0)
Alkaline Phosphatase: 48 U/L (ref 38–126)
Anion gap: 3 — ABNORMAL LOW (ref 5–15)
BUN: 9 mg/dL (ref 6–20)
CO2: 28 mmol/L (ref 22–32)
Calcium: 7.5 mg/dL — ABNORMAL LOW (ref 8.9–10.3)
Chloride: 112 mmol/L — ABNORMAL HIGH (ref 98–111)
Creatinine, Ser: 0.41 mg/dL — ABNORMAL LOW (ref 0.44–1.00)
GFR, Estimated: >60 mL/min (ref >60)
Glucose, Bld: 148 mg/dL — ABNORMAL HIGH (ref 70–99)
Potassium: 3.3 mmol/L — ABNORMAL LOW (ref 3.5–5.1)
Sodium: 143 mmol/L (ref 135–145)
Total Bilirubin: 2.1 mg/dL — ABNORMAL HIGH (ref 0.3–1.2)
Total Protein: 4.2 g/dL — ABNORMAL LOW (ref 6.5–8.1)

## 2022-03-29 LAB — MAGNESIUM: Magnesium: 1.7 mg/dL (ref 1.7–2.4)

## 2022-03-29 LAB — CBC
HCT: 29.1 % — ABNORMAL LOW (ref 36.0–46.0)
Hemoglobin: 9.2 g/dL — ABNORMAL LOW (ref 12.0–15.0)
MCH: 28.8 pg (ref 26.0–34.0)
MCHC: 31.6 g/dL (ref 30.0–36.0)
MCV: 91.2 fL (ref 80.0–100.0)
Platelets: 197 10*3/uL (ref 150–400)
RBC: 3.19 MIL/uL — ABNORMAL LOW (ref 3.87–5.11)
RDW: 14.6 % (ref 11.5–15.5)
WBC: 22 10*3/uL — ABNORMAL HIGH (ref 4.0–10.5)
nRBC: 0.4 % — ABNORMAL HIGH (ref 0.0–0.2)

## 2022-03-29 LAB — GLUCOSE, CAPILLARY
Glucose-Capillary: 136 mg/dL — ABNORMAL HIGH (ref 70–99)
Glucose-Capillary: 132 mg/dL — ABNORMAL HIGH (ref 70–99)
Glucose-Capillary: 130 mg/dL — ABNORMAL HIGH (ref 70–99)
Glucose-Capillary: 138 mg/dL — ABNORMAL HIGH (ref 70–99)
Glucose-Capillary: 123 mg/dL — ABNORMAL HIGH (ref 70–99)

## 2022-03-29 LAB — PHOSPHORUS: Phosphorus: 3.4 mg/dL (ref 2.5–4.6)

## 2022-03-29 MED ORDER — POTASSIUM CHLORIDE 10 MEQ/100ML IV SOLN
10.0000 meq | INTRAVENOUS | Status: AC
  Administered 2022-03-29 (×2): 10 meq via INTRAVENOUS
  Filled 2022-03-29 (×2): qty 100

## 2022-03-29 MED ORDER — TRACE MINERALS CU-MN-SE-ZN 300-55-60-3000 MCG/ML IV SOLN
INTRAVENOUS | Status: AC
  Filled 2022-03-29: qty 720

## 2022-03-29 MED ORDER — MAGNESIUM SULFATE 2 GM/50ML IV SOLN
2.0000 g | Freq: Once | INTRAVENOUS | Status: AC
  Administered 2022-03-29: 2 g via INTRAVENOUS
  Filled 2022-03-29: qty 50

## 2022-03-29 NOTE — Progress Notes (Signed)
Inpatient Rehab Admissions Coordinator:   Consult received and chart reviewed.  Note clamping trials for NGT today, possible initiation of diet if residuals remain low.  Will need to be free of NGT for potential transition to CIR.  I will f/u Monday for assessment.    Shann Medal, PT, DPT Admissions Coordinator [phone] 03/29/22  2:46 PM

## 2022-03-29 NOTE — Progress Notes (Signed)
PROGRESS NOTE    Lacey Harrison  HBZ:[id number] DOB: 1986/07/19 DOA: 03/21/2022 PCP: Rubie Maid, MD   Brief Narrative:  This 36 yo Female presenting to [facility] ED from home on 03/21/22 with complaints of sudden onset severe abdominal pain. She recently on 03/18/22 had robotic adhesiolysis, left salpingectomy, drainage of 500 mL from large LEFT peritoneal cyst & chromotubation. She was discharged home on 1/01 without complication. She reported being on the floor with her cat and twisting her body around, with sudden acute abdominal pain. Patient complained of severe pain in lower abdomen radiating towards her vagina.  07/27: CT imaging abscess. Leukocytosis with left shift at 16.8. Initiated on IV Zosyn and Doxycycline. Admitted to OBGYN.  07/28: IR procedure 07/28 CT drain placement into pelvic abscess, left anterior approach, 12 Fr to bulb suction. Patient developed severe sepsis/septic shock after drainage of abdominal pelvic abscess.  She required transfer to stepdown unit, dyspnea worsened with desaturations noted, due to concerns for respiratory status PCCM consulted, receiving BS abx w/ zosyn, doxycycline & vancomycin, volume resuscitation w/ IVF, awaiting culture results   07/29: Remains in SDU w/ ileus and sepsis. IV antibiotics: Continue Zosyn, added fluconazole due to budding yeast noted on abscess fluid, Discontinue doxycycline/vancomycin x1, continue IVF hydration - bicarb infusion, DC other IV fluids due to anasarca. Developing ileus, query feculent appearance on abscess drain, repeat CT abdomen pelvis performed, new loculations on the right, anasarca, distended urinary bladder. No contrast extravasation to suggest bowel perforation 07/30: Continues to have abdominal pain, required NG last night due to recurrent vomiting.  Had 1200 mL of bilious material suctioned from stomach.  NG in place, abdomen less distended. IV antibiotics: Continue Zosyn, fluconazole, added Clindamycin d/t clostridium  prefringens noted on culture.  07/31: repeat CT The abscess is slightly decreased compared to prior exam. Continuing on Unasyn and Diflucan.  08/01: VSS, WBC trending down, BCx NGx4d. Ileus no better. Drain in place, minimal output. NG in place. PICC and TPN initiated. Spoke w/ GYN, plan for surgical consult and ID consult. Pt is in significant pain though lab/VS parameters and imaging show improvement, suspect pain more d/t ileus. 8/02: Patient continued to have persistently elevated white cell count with abdominal pain.  General surgery has scheduled exploratory laparotomy. 8/03: Postoperative day 1.  Exploratory laparotomy, sigmoid colectomy with colostomy creation, Hartmann procedure, lysis of additions.  Continue TPN and IV antibiotics. 08/04: Leukocytosis persist could be due to stress response.  Remains afebrile, continue NPO. and TPN, wound care consult, colostomy education.  Assessment & Plan:   Principal Problem:   Postoperative intra-abdominal abscess Active Problems:   Acute respiratory failure with hypoxia (HCC) in seting of MSKpain, atelectasis, severe sepsis    Severe sepsis (HCC) without septic shock, d/t postoperative abscess, s/p pelvic drain placement    Ileus, postoperative (HCC)   AKI (acute kidney injury) ([place]) in setting of sepsis, IV contrast, NAGMA - RESOLVED    Abdominal pain   Severe sepsis ([place]) without septic shock, d/t postoperative abscess, s/p pelvic drain placement : Cultures growing budding yeast, EColi and Clostridium --> Continue IV abx: Unasyn and Diflucan Abscess shows some improvement on CT on 07/30 --> repeat imaging showed worsening  Monitor CBC and VS for sepsis parameters --> no improvement. Patient underwent  Exploratory laparotomy, sigmoid colectomy with colostomy creation, Hartmann procedure, lysis of additions. POD # 2 WBC remains elevated.  As per ID if she continues to trend up,  consider changing Unasyn to meropenem..   Acute  respiratory  failure with hypoxia (HCC) in seting of MSKpain, atelectasis, severe sepsis  Continue Supplemental O2 prn goal >90% Continue bronchodilators  Incentive spirometry    AKI (acute kidney injury) ([place]) in setting of sepsis, IV contrast, NAGMA - RESOLVED  Trend BMP I&O close monitor UOP   Ileus, postoperative ([place]) NG in place PICC and TPN placed on 03/26/22  NPO for now, ice chips as needed. General surgery following Postoperative day 2.       Exploratory laparotomy, sigmoid colectomy with colostomy creation, Hartmann procedure, lysis of additions.   Postoperative intra-abdominal abscess GYN following ID and general surgery consulted Leukocytosis worsening, she underwent exploratory laparotomy. Leukocytosis persisted could be stress reaction.  Continue to monitor.   Abdominal pain Suspect related more to ileus since infection parameters are improving Discussed would like to avoid opiates if possible as this will not improve ileus Trial switching morphine to Dilaudid low-dose today Patient encouraged out of bed/walking   Hypokalemia: Replaced.  Continue to monitor  DVT prophylaxis: heparin Code Status: Full code. Family Communication: Family/ mother at bed side. Disposition Plan:  Patient underwent exploratory laparotomy, colectomy and colostomy creation,.  PT recommended acute inpatient rehab.  Consultants:  PCCM OB/GYN IR General surgery Infectious diseases  Procedures:  07/28 CT drain placement into pelvic abscess 07/30 NG tube placed  07/31 PICC line.  Antimicrobials:   Anti-infectives (From admission, onward)    Start     Dose/Rate Route Frequency Ordered Stop   03/25/22 1600  Ampicillin-Sulbactam (UNASYN) 3 g in sodium chloride 0.9 % 100 mL IVPB        3 g 200 mL/hr over 30 Minutes Intravenous Every 6 hours 03/25/22 1134     03/24/22 2200  clindamycin (CLEOCIN) IVPB 900 mg  Status:  Discontinued        900 mg 100 mL/hr over 30 Minutes Intravenous Every 8  hours 03/24/22 1854 03/25/22 1134   03/23/22 1500  fluconazole (DIFLUCAN) IVPB 400 mg        400 mg 100 mL/hr over 120 Minutes Intravenous Every 24 hours 03/23/22 1358     03/22/22 1851  vancomycin variable dose per unstable renal function (pharmacist dosing)  Status:  Discontinued         Does not apply See admin instructions 03/22/22 1851 03/23/22 1051   03/22/22 1645  vancomycin (VANCOCIN) IVPB 1000 mg/200 mL premix  Status:  Discontinued        1,000 mg 200 mL/hr over 60 Minutes Intravenous  Once 03/22/22 1553 03/22/22 1558   03/22/22 1645  vancomycin (VANCOREADY) IVPB 2000 mg/400 mL        2,000 mg 200 mL/hr over 120 Minutes Intravenous  Once 03/22/22 1558 03/22/22 1814   03/22/22 0400  piperacillin-tazobactam (ZOSYN) IVPB 3.375 g  Status:  Discontinued        3.375 g 12.5 mL/hr over 240 Minutes Intravenous Every 8 hours 03/21/22 2047 03/25/22 1134   03/21/22 2200  doxycycline (VIBRAMYCIN) 100 mg in sodium chloride 0.9 % 250 mL IVPB  Status:  Discontinued        100 mg 125 mL/hr over 120 Minutes Intravenous Every 12 hours 03/21/22 2047 03/23/22 1358   03/21/22 1900  piperacillin-tazobactam (ZOSYN) IVPB 3.375 g        3.375 g 12.5 mL/hr over 240 Minutes Intravenous STAT 03/21/22 1853 03/21/22 2300        Subjective: Patient was seen and examined at bedside.  Overnight events noted.   Patient reports feeling much better  today. She underwent exploratory laparotomy with colostomy creation. POD # 2. Patient remains on TPN.  She still has NG tube with low intermittent suction.  Objective: Vitals:   03/29/22 0500 03/29/22 0600 03/29/22 0800 03/29/22 0815  BP: 100/62 106/66 102/65   Pulse: 88 90 83 99  Resp: 16 (!) 24 15 (!) 26  Temp:   99.1 F (37.3 C)   TempSrc:   Oral   SpO2: 98% 100% 97% 98%  Weight:      Height:        Intake/Output Summary (Last 24 hours) at 03/29/2022 1354 Last data filed at 03/29/2022 0600 Gross per 24 hour  Intake 2312.56 ml  Output 1025 ml  Net  1287.56 ml   Filed Weights   03/21/22 1722 03/26/22 1800 03/27/22 0700  Weight: 91 kg 98 kg 101 kg    Examination:  General exam: Appears comfortable, not in any acute distress,  Deconditioned,NG tube connected with suction. Respiratory system: CTA bilaterally, respiratory effort normal.  No wheezing, no crackles. Cardiovascular system: S1 & S2 heard, regular rate and rhythm, no murmur. Gastrointestinal system: Soft, mildly tender, distended, colostomy noted with brownish stool.  Bowel sounds: Sluggish. Central nervous system: Alert and oriented X 3. No focal neurological deficits. Extremities: No edema, no cyanosis, no clubbing. Skin: No rashes, lesions or ulcers Psychiatry: Judgement and insight appear normal. Mood & affect appropriate.     Data Reviewed: I have personally reviewed following labs and imaging studies  CBC: Recent Labs  Lab 03/25/22 0353 03/26/22 0622 03/27/22 0747 03/28/22 0512 03/29/22 0422  WBC 13.0* 10.8* 17.5* 22.0* 22.0*  NEUTROABS  --  7.5  --   --   --   HGB 10.8* 11.1* 10.5* 10.5* 9.2*  HCT 32.3* 33.0* 32.2* 32.8* 29.1*  MCV 86.1 86.4 87.3 89.4 91.2  PLT 189 165 161 183 841   Basic Metabolic Panel: Recent Labs  Lab 03/25/22 0353 03/26/22 0622 03/27/22 0747 03/27/22 2009 03/28/22 0512 03/29/22 0422  NA 135 137 140  --  141 143  K 3.0* 3.7 2.9* 3.5 3.7 3.3*  CL 97* 102 105  --  109 112*  CO2 '30 25 30  '$ --  26 28  GLUCOSE 81 72 126*  --  221* 148*  BUN '14 18 12  '$ --  10 9  CREATININE 0.82 0.53 0.51  --  0.50 0.41*  CALCIUM 7.6* 8.0* 7.8*  --  7.3* 7.5*  MG 2.4 2.3 1.9  --  2.0 1.7  PHOS 2.2* 4.8* 3.2  --  2.7 3.4   GFR: Estimated Creatinine Clearance: 110.2 mL/min (A) (by C-G formula based on SCr of 0.41 mg/dL (L)). Liver Function Tests: Recent Labs  Lab 03/24/22 0447 03/25/22 0353 03/27/22 0747 03/28/22 0512 03/29/22 0422  AST 20 18 34 31 32  ALT '21 14 17 19 18  '$ ALKPHOS 62 49 56 44 48  BILITOT 3.3* 3.4* 4.5* 2.8* 2.1*  PROT  5.6* 5.1* 5.3* 4.4* 4.2*  ALBUMIN 2.4* 2.1* 2.2* 2.1* 1.8*   No results for input(s): "LIPASE", "AMYLASE" in the last 168 hours.  No results for input(s): "AMMONIA" in the last 168 hours. Coagulation Profile: No results for input(s): "INR", "PROTIME" in the last 168 hours.  Cardiac Enzymes: No results for input(s): "CKTOTAL", "CKMB", "CKMBINDEX", "TROPONINI" in the last 168 hours. BNP (last 3 results) No results for input(s): "PROBNP" in the last 8760 hours. HbA1C: No results for input(s): "HGBA1C" in the last 72 hours. CBG: Recent Labs  Lab 03/28/22 1803 03/28/22 2259 03/29/22 0444 03/29/22 0536 03/29/22 1149  GLUCAP 153* 139* 136* 132* 130*   Lipid Profile: Recent Labs    03/27/22 0747 03/28/22 0512  TRIG 283* 298*   Thyroid Function Tests: No results for input(s): "TSH", "T4TOTAL", "FREET4", "T3FREE", "THYROIDAB" in the last 72 hours. Anemia Panel: No results for input(s): "VITAMINB12", "FOLATE", "FERRITIN", "TIBC", "IRON", "RETICCTPCT" in the last 72 hours. Sepsis Labs: Recent Labs  Lab 03/22/22 1403 03/22/22 2242 03/23/22 0431 03/24/22 0447 03/24/22 0908  PROCALCITON  --  27.66 27.17 16.44  --   LATICACIDVEN 4.9* 2.9* 2.5*  --  1.3    Recent Results (from the past 240 hour(s))  Culture, blood (x 2)     Status: None   Collection Time: 03/22/22 10:48 AM   Specimen: BLOOD  Result Value Ref Range Status   Specimen Description BLOOD BLOOD LEFT HAND  Final   Special Requests   Final    BOTTLES DRAWN AEROBIC AND ANAEROBIC Blood Culture adequate volume   Culture   Final    NO GROWTH 5 DAYS Performed at [facility], [address] [place], [place] [address]    Report Status 03/27/2022 FINAL  Final  Culture, blood (x 2)     Status: None   Collection Time: 03/22/22 11:00 AM   Specimen: BLOOD  Result Value Ref Range Status   Specimen Description BLOOD BLOOD RIGHT HAND  Final   Special Requests AEROBIC BOTTLE ONLY Blood Culture adequate volume   Final   Culture   Final    NO GROWTH 5 DAYS Performed at [facility], [address] [place], [place] [address]    Report Status 03/27/2022 FINAL  Final  Aerobic/Anaerobic Culture w Gram Stain (surgical/deep wound)     Status: None   Collection Time: 03/22/22 12:57 PM   Specimen: Wound; Abscess  Result Value Ref Range Status   Specimen Description   Final    WOUND Performed at [facility], [address], [place], [place] [address]    Special Requests   Final    ABD ABSCESS Performed at [facility], [address] [place], [place] [address]    Gram Stain   Final    ABUNDANT WBC PRESENT,BOTH PMN AND MONONUCLEAR ABUNDANT GRAM NEGATIVE RODS FEW GRAM POSITIVE RODS FEW GRAM POSITIVE COCCI IN PAIRS FEW BUDDING YEAST SEEN    Culture   Final    ABUNDANT ESCHERICHIA COLI MODERATE CLOSTRIDIUM PERFRINGENS Standardized susceptibility testing for this organism is not available. ABUNDANT BACTEROIDES VULGATUS BETA LACTAMASE NEGATIVE Performed at [facility], [place] [address], [place], [place] [address]    Report Status 03/25/2022 FINAL  Final   Organism ID, Bacteria ESCHERICHIA COLI  Final      Susceptibility   Escherichia coli - MIC*    AMPICILLIN 8 SENSITIVE Sensitive     CEFAZOLIN <=4 SENSITIVE Sensitive     CEFEPIME <=0.12 SENSITIVE Sensitive     CEFTAZIDIME <=1 SENSITIVE Sensitive     CEFTRIAXONE <=0.25 SENSITIVE Sensitive     CIPROFLOXACIN <=0.25 SENSITIVE Sensitive     GENTAMICIN <=1 SENSITIVE Sensitive     IMIPENEM <=0.25 SENSITIVE Sensitive     TRIMETH/SULFA <=20 SENSITIVE Sensitive     AMPICILLIN/SULBACTAM <=2 SENSITIVE Sensitive     PIP/TAZO <=4 SENSITIVE Sensitive     * ABUNDANT ESCHERICHIA COLI  MRSA Next Gen by PCR, Nasal     Status: None   Collection Time: 03/22/22 11:00 PM  Specimen: Nasal Mucosa; Nasal Swab  Result Value Ref Range Status   MRSA by PCR Next Gen NOT DETECTED NOT DETECTED Final    Comment: (NOTE) The  GeneXpert MRSA Assay (FDA approved for NASAL specimens only), is one component of a comprehensive MRSA colonization surveillance program. It is not intended to diagnose MRSA infection nor to guide or monitor treatment for MRSA infections. Test performance is not FDA approved in patients less than 81 years old. Performed at [facility], [address] [place], [place] [address]   Urine Culture     Status: None   Collection Time: 03/24/22  6:02 PM   Specimen: Urine, Catheterized  Result Value Ref Range Status   Specimen Description   Final    URINE, CATHETERIZED Performed at [facility], [address], [place], [place] [address]    Special Requests   Final    Normal Performed at [facility], [address], [place], [place] [address]    Culture   Final    NO GROWTH Performed at [facility], [place] [address], [place], [place] [address]    Report Status 03/25/2022 FINAL  Final   Radiology Studies: DG Abd Portable 1V  Result Date: 03/29/2022 CLINICAL DATA:  Ileus. EXAM: PORTABLE ABDOMEN - 1 VIEW COMPARISON:  Abdominal radiographs 03/27/2022. FINDINGS: Unchanged position of an enteric tube which coils in the stomach with tip in the expected location of the gastric cardia. A surgical drain projects over the lower abdomen/upper pelvis. Persistent contrast within the colon and rectum. Previously demonstrated extraluminal contrast within the pelvis is no longer well appreciated. Persistent mild air distension of small bowel loops, although this has decreased as compared to the prior abdominal radiographs of 03/27/2022. IMPRESSION: Persistent although decreased mild air distention of small-bowel loops. Residual contrast within the colon and rectum. Findings are compatible with the provided history of ileus. Previously demonstrated extraluminal contrast within the pelvis is no longer well appreciated. Unchanged position of an enteric tube with tip terminating  in the region of the gastric cardia. Electronically Signed   By: Kellie Simmering D.O.   On: 03/29/2022 08:40    Scheduled Meds:  Chlorhexidine Gluconate Cloth  6 each Topical Daily   heparin injection (subcutaneous)  5,000 Units Subcutaneous Q8H   insulin aspart  0-15 Units Subcutaneous Q6H   insulin glargine-yfgn  10 Units Subcutaneous QHS   ketorolac  30 mg Intravenous Q6H   pantoprazole (PROTONIX) IV  40 mg Intravenous QHS   sodium chloride flush  10-40 mL Intracatheter Q12H   sodium chloride flush  5 mL Intracatheter Q8H   Continuous Infusions:  sodium chloride 10 mL/hr at 03/26/22 1939   ampicillin-sulbactam (UNASYN) IV 3 g (03/29/22 1221)   fluconazole (DIFLUCAN) IV Stopped (03/28/22 1541)   magnesium sulfate bolus IVPB     TPN ADULT (ION) 75 mL/hr at 03/29/22 0600   TPN ADULT (ION)       LOS: 8 days    Time spent: 35 mins    Dannya Pitkin, MD [facility]   If 7PM-7AM, please contact night-coverage

## 2022-03-29 NOTE — Consult Note (Signed)
PHARMACY - TOTAL PARENTERAL NUTRITION CONSULT NOTE   Indication: Prolonged ileus  Patient Measurements: Height: '5\' 3"'$  (160 cm) Weight:  (unable to obtain; bed malfunction) IBW/kg (Calculated) : 52.4 TPN AdjBW (KG): 62.1 Body mass index is 39.43 kg/m.  Assessment:  Patient is [name] with medical history including recent laparoscopic adhesiolysis, left salpingectomy, drainage of peritoneal cyst, and chromotubation on 7/24 who presented to the ED 7/27 with severe pain in abdomen and vagina. Imaging concerning for post-operative abscess. Patient underwent CT drain placement into pelvic abscess on 7/28. Hospital course has been complicated by ileus. Pharmacy consulted to initiate TPN to provide nutrition given post-operative ileus.   Glucose / Insulin: BG 132-217, requiring 15 units insulin aspart in the past 24 hours. Semglee 10u HS added 8/3 Electrolytes: Mild hypokalemia. Magnesium at LLN. Mild hyperchloremia Renal: Scr < 1 Hepatic: No transaminitis. Hyperbilirubinemia resolving Intake / Output; MIVF: JP drain left abdomen, NGT decompression, colostomy LUQ. Drain with minimal output. I&O: + 12L GI Imaging: 7/31 CT abdomen / pelvis: Percutaneous drainage catheter is seen in left-sided abdominal abscess. Mild proximal small bowel dilatation is noted which is slightly improved compared to prior exam, most consistent with ileus. Stable mild ascites is noted in the pelvis and right side of the abdomen. GI Surgeries / Procedures:  7/28: CT drain placement into pelvic abscess 8/2: Pending ExLap with general surgery  Central access: PICC Double Lumen placed 8/1 TPN start date: 8/1   Nutritional Goals: Goal TPN rate is 75 mL/hr (provides 108 g of protein and 2217 kcals per day)  RD Assessment: Estimated Needs Total Energy Estimated Needs: 2000-2300kcal/day Total Protein Estimated Needs: 100-115g/day Total Fluid Estimated Needs: 1.6-1.8L/day  Current Nutrition:  NPO  Plan:   --Continue TPN at 75 mL/hr (goal rate)  --Electrolytes in TPN: Na 16mq/L, K 524m/L, Ca 14m20mL, Mg 14mE28m, and Phos 114mm66m. Cl:Ac 1:1 K 3.3, Kcl 10 mEq IV x 2 Mg 1.7, Magnesium sulfate 2 g IV x 1 --Add standard MVI and trace elements to TPN, add thiamine 100 mg x 3 days (completed) --Continue Moderate q6h SSI and adjust as needed  --Monitor TPN labs on Mon/Thurs, daily until stable  Naevia Unterreiner Benita Gutter2023,7:32 AM

## 2022-03-29 NOTE — Evaluation (Signed)
Physical Therapy Evaluation Patient Details Name: Lacey Harrison MRN: [id number] DOB: Dec 22, 1985 Today's Date: 03/29/2022  History of Present Illness  Pt is a 36 y.o. female presenting to hospital post op day 3 from robotic assisted laparoscopic surgery (lysis of adhesions, left salpingectomy, drainage of large peritoneal cyst, chromotubation 7/24) c/o sudden onset of severe abdominal pain.  CT drain placement into pelvic abscess 7/28.  Pt developed severe sepsis/septic shock after drainage of abdominal pelvic abscess and transferred to ICU.  Pt also noted with acute hypoxic respiratory distress and AKI; developing ileus.  S/p 8/2 exploratory laparotomy, sigmoid colectomy with colostomy creation, and Hartman's procedure for perforated sigmoid colon.  PMH includes smoking, anxiety, lyme disease, migraines, PNA, chromopertubation, cystectomy L, infertility, previous surgery for L ovarian cystectomy L for dermoid cyst complicated by cystotomy and bladder repair in 2007, pelvic pain (underwent 02/16/18 lap and extensive abdominopelvic adhesiolysis).  Clinical Impression  Pt seen for PT/OT co-evaluation.  Prior to hospital admission, pt was independent with functional mobility; lives with her husband in 1 level home with 5 STE with railings.  B hip and abdominal pain 6/10 at rest beginning of session and 7/10 at rest end of session; increased time required for mobility d/t this pain (and also general weakness).  Currently pt is mod assist x2 via logrolling supine to sitting edge of bed; min assist to stand from elevated bed and walk a few feet bed to recliner with RW use (with 2nd assist for line management); mod assist to control descent sitting into recliner.  Pt requiring pacing during session d/t pain.  Pt's LE's noted to be edematous and pt reporting legs feeling heavy when attempting to move.  Pt appearing very motivated to participate in therapy and improve strength and functional status.  Pt would benefit  from skilled PT to address noted impairments and functional limitations (see below for any additional details).  Upon hospital discharge, pt would benefit from CIR.    Recommendations for follow up therapy are one component of a multi-disciplinary discharge planning process, led by the attending physician.  Recommendations may be updated based on patient status, additional functional criteria and insurance authorization.  Follow Up Recommendations Acute inpatient rehab (3hours/day)      Assistance Recommended at Discharge Frequent or constant Supervision/Assistance  Patient can return home with the following  Two people to help with walking and/or transfers;Two people to help with bathing/dressing/bathroom;Assistance with cooking/housework;Assist for transportation;Help with stairs or ramp for entrance    Equipment Recommendations Rolling walker (2 wheels);BSC/3in1;Wheelchair (measurements PT);Wheelchair cushion (measurements PT);Hospital bed  Recommendations for Other Services  Rehab consult    Functional Status Assessment Patient has had a recent decline in their functional status and demonstrates the ability to make significant improvements in function in a reasonable and predictable amount of time.     Precautions / Restrictions Precautions Precautions: Fall Precaution Comments: JP drain L abdomen, NG tube, foley, picc line, o2; long abdominal incision; L UQ colostomy Restrictions Weight Bearing Restrictions: No      Mobility  Bed Mobility Overal bed mobility: Needs Assistance Bed Mobility: Rolling, Sidelying to Sit Rolling: Mod assist, +2 for physical assistance Sidelying to sit: Mod assist, +2 for physical assistance, +2 for safety/equipment, HOB elevated       General bed mobility comments: step by step cueing for overall technique    Transfers Overall transfer level: Needs assistance Equipment used: Rolling walker (2 wheels) Transfers: Sit to/from Stand Sit to  Stand: Min assist, From elevated surface, +  2 safety/equipment           General transfer comment: pt reporting bed too low (to stand from) so bed height elevated to stand up to RW with min assist; mod assist to control descent sitting in recliner; vc's for UE/LE placement and overall technique; 2nd assist for line/drain management    Ambulation/Gait Ambulation/Gait assistance: Min assist, +2 safety/equipment Gait Distance (Feet): 3 Feet (bed to recliner) Assistive device: Rolling walker (2 wheels)   Gait velocity: decreased     General Gait Details: decreased B LE step length/foot clearance; increased effort/time to take steps  Stairs            Wheelchair Mobility    Modified Rankin (Stroke Patients Only)       Balance Overall balance assessment: Needs assistance Sitting-balance support: Feet supported, Bilateral upper extremity supported Sitting balance-Leahy Scale: Fair Sitting balance - Comments: steady static sitting   Standing balance support: During functional activity, Reliant on assistive device for balance, Bilateral upper extremity supported Standing balance-Leahy Scale: Fair Standing balance comment: steady static standing with heavy B UE support on RW                             Pertinent Vitals/Pain Pain Assessment Pain Assessment: 0-10 Pain Score: 7  Pain Location: B hips, abdomen with movement Pain Descriptors / Indicators: Discomfort, Grimacing, Guarding Pain Intervention(s): Limited activity within patient's tolerance, Monitored during session, Premedicated before session, Repositioned HR increased from 96 to 114 bpm with activities; O2 sats WFL on 1 L O2 via nasal cannula; BP 107/72 pre-mobility    Home Living Family/patient expects to be discharged to:: Private residence Living Arrangements: Spouse/significant other Available Help at Discharge: Family Type of Home: House Home Access: Stairs to enter Entrance Stairs-Rails:  Psychiatric nurse of Steps: 5   Home Layout: One level Home Equipment: None      Prior Function Prior Level of Function : Independent/Modified Independent             Mobility Comments: Independent (no AD use) ADLs Comments: Independent in all ADL/IADL; 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        Extremity/Trunk Assessment   Upper Extremity Assessment Upper Extremity Assessment: Generalized weakness    Lower Extremity Assessment Lower Extremity Assessment: Generalized weakness;RLE deficits/detail;LLE deficits/detail RLE Deficits / Details: good R quad set strength; 2+/5 hip flexion; at least 3/5 ankle DF/PF; 3-/5 knee flexion/extension LLE Deficits / Details: fair L quad set strength; 2+/5 hip flexion; at least 3/5 ankle DF/PF; 3-/5 knee flexion/extension    Cervical / Trunk Assessment Cervical / Trunk Assessment: Normal  Communication   Communication: No difficulties  Cognition Arousal/Alertness: Awake/alert Behavior During Therapy: WFL for tasks assessed/performed, Anxious Overall Cognitive Status: Within Functional Limits for tasks assessed                                          General Comments General comments (skin integrity, edema, etc.): All lines/leads intact following intervention.  Nursing cleared pt for participation in physical therapy.  Per verbal discussion with PA Schulz--pt cleared for OOB mobility.  Pt agreeable to PT session.    Exercises  Bed mobility and transfer training   Assessment/Plan    PT Assessment Patient needs continued PT services  PT Problem List Decreased strength;Decreased activity tolerance;Decreased  balance;Decreased mobility;Decreased knowledge of use of DME;Decreased knowledge of precautions;Cardiopulmonary status limiting activity;Pain;Decreased skin integrity       PT Treatment Interventions DME instruction;Gait training;Stair training;Functional mobility training;Therapeutic  activities;Therapeutic exercise;Balance training;Patient/family education    PT Goals (Current goals can be found in the Care Plan section)  Acute Rehab PT Goals Patient Stated Goal: to improve pain and mobility PT Goal Formulation: With patient/family Time For Goal Achievement: 04/12/22 Potential to Achieve Goals: Good    Frequency 7X/week     Co-evaluation PT/OT/SLP Co-Evaluation/Treatment: Yes Reason for Co-Treatment: Complexity of the patient's impairments (multi-system involvement);For patient/therapist safety;To address functional/ADL transfers PT goals addressed during session: Mobility/safety with mobility;Balance;Proper use of DME OT goals addressed during session: ADL's and self-care       AM-PAC PT "6 Clicks" Mobility  Outcome Measure Help needed turning from your back to your side while in a flat bed without using bedrails?: Total Help needed moving from lying on your back to sitting on the side of a flat bed without using bedrails?: Total Help needed moving to and from a bed to a chair (including a wheelchair)?: A Lot Help needed standing up from a chair using your arms (e.g., wheelchair or bedside chair)?: A Lot Help needed to walk in hospital room?: Total Help needed climbing 3-5 steps with a railing? : Total 6 Click Score: 8    End of Session Equipment Utilized During Treatment: Oxygen (1 L via nasal cannula) Activity Tolerance: Patient limited by pain Patient left: in chair;with call bell/phone within reach;with family/visitor present;with SCD's reapplied;Other (comment) (B heels floating via pillow support) Nurse Communication: Mobility status;Precautions PT Visit Diagnosis: Other abnormalities of gait and mobility (R26.89);Muscle weakness (generalized) (M62.81);Pain Pain - Right/Left:  (B hips; abdomen) Pain - part of body:  (B hips; abdomen)    Time: 4599-7741 PT Time Calculation (min) (ACUTE ONLY): 28 min   Charges:   PT Evaluation $PT Eval Low  Complexity: 1 Low PT Treatments $Therapeutic Activity: 8-22 mins       Leitha Bleak, PT 03/29/22, 1:28 PM

## 2022-03-29 NOTE — Plan of Care (Signed)
Continuing with plan of care. 

## 2022-03-29 NOTE — Evaluation (Addendum)
Occupational Therapy Evaluation Patient Details Name: Lacey Harrison MRN: [id number] DOB: 1985/11/14 Today's Date: 03/29/2022   History of Present Illness pt is a 36 year old female s/p exploratory laparotomy, sigmoid colectomy with colostomy creation, Hartmann procedure, lysis of adhesions 03/27/22 after robotic adhesiolysis, left salpingectomy, drainage of 500 mL from large LEFT peritoneal cyst & chromotubation on 7/24. She was discharged home on 8/48 without complication. Pt returned to ED on 7/24 with sudden acute abdominal pain. Pt with severe sepsis, acute respiratory failure, postoperative ileus, posoperative intra-abdominal abscess. PMH significant for Infertility, previous gyn surgery for left ovarian cystectomy left for dermoid cyst complicated by cystotomy and bladder repair in 2007, pelvic pain underwent on 02/16/18 lap and extensive abdominopelvic adhesiolysis   Clinical Impression   Chart reviewed, RN cleared pt for participation in OT evaluation. Co tx completed with PT on this date. PTA pt is independent in all ADL/IADL, cooks, cleans, drives, laundry. Pt lives with her husband, 5 STE, one level house. Pt movement is limited by pain, requiring MOD A +2 for bed mobility, STS with MIN A, step pivot transfer to bedside chair with MIN A with RW. +2required for line/lead management. Pt unable to attempt further mobility/movement due to generalized weakness and reported pain. MAX A required for LB dressing. Pt and family educated re: body mechanics for decreased pain during mobility, future DME use. Pt is performing ADL/functional mobility significantly below PLOF, would benefit from intensive therapy to facilitate return to PLOF. Pt is left in bedside chair, NAD, all needs met. All lines/leads intact. OT will follow acutely.      Recommendations for follow up therapy are one component of a multi-disciplinary discharge planning process, led by the attending physician.  Recommendations may be  updated based on patient status, additional functional criteria and insurance authorization.   Follow Up Recommendations  Acute inpatient rehab (3hours/day)    Assistance Recommended at Discharge Frequent or constant Supervision/Assistance  Patient can return home with the following A lot of help with walking and/or transfers;A lot of help with bathing/dressing/bathroom    Functional Status Assessment     Equipment Recommendations  BSC/3in1;Tub/shower seat    Recommendations for Other Services       Precautions / Restrictions Precautions Precautions: Fall Precaution Comments: jp drain, NG, foley, picc line, o2 Restrictions Weight Bearing Restrictions: No      Mobility Bed Mobility Overal bed mobility: Needs Assistance Bed Mobility: Sidelying to Sit   Sidelying to sit: Mod assist, +2 for physical assistance, +2 for safety/equipment, HOB elevated       General bed mobility comments: step by step vcs for body mechanics    Transfers Overall transfer level: Needs assistance Equipment used: Rolling walker (2 wheels) Transfers: Sit to/from Stand Sit to Stand: Min assist, From elevated surface           General transfer comment: MIN A +1 for STS, +2 for line/drain management      Balance Overall balance assessment: Needs assistance Sitting-balance support: Feet supported Sitting balance-Leahy Scale: Fair     Standing balance support: During functional activity, Reliant on assistive device for balance, Bilateral upper extremity supported Standing balance-Leahy Scale: Fair                             ADL either performed or assessed with clinical judgement   ADL Overall ADL's : Needs assistance/impaired Eating/Feeding: NPO  Lower Body Dressing: Maximal assistance Lower Body Dressing Details (indicate cue type and reason): without AD; unable to forward flex to donn socks due to pain Toilet Transfer: Minimal  assistance;Rolling walker (2 wheels) Toilet Transfer Details (indicate cue type and reason): step pivot; simulated to bedside chair                 Vision Patient Visual Report: No change from baseline       Perception     Praxis      Pertinent Vitals/Pain Pain Assessment Pain Assessment: 0-10 Pain Score: 7  Pain Location: B hips, abdomen with movement Pain Descriptors / Indicators: Discomfort, Grimacing, Guarding Pain Intervention(s): Limited activity within patient's tolerance, Monitored during session, Repositioned     Hand Dominance     Extremity/Trunk Assessment Upper Extremity Assessment Upper Extremity Assessment: Generalized weakness   Lower Extremity Assessment Lower Extremity Assessment: Defer to PT evaluation    Cervical / Trunk Assessment Cervical / Trunk Assessment: Normal   Communication Communication Communication: No difficulties   Cognition Arousal/Alertness: Awake/alert Behavior During Therapy: WFL for tasks assessed/performed Overall Cognitive Status: Within Functional Limits for tasks assessed                                       General Comments  pt with clamped NG, jp drain, foley, picc line, ostomy all lines/leads intact following intervention; Edema noted throughout BLE, B hands. HR up to 114 with activity, spo2 >905 on 1L via [place] throughout    Exercises Other Exercises Other Exercises: edu pt, husband re: role of OT, role of rehab, discharge recommendations, potential future DME use   Shoulder Instructions      Home Living Family/patient expects to be discharged to:: Private residence Living Arrangements: Spouse/significant other Available Help at Discharge: Family Type of Home: House Home Access: Stairs to enter Technical brewer of Steps: 5 Entrance Stairs-Rails: Right;Left Home Layout: One level     Bathroom Shower/Tub: Teacher, early years/pre: Standard     Home Equipment: None           Prior Functioning/Environment Prior Level of Function : Independent/Modified Independent             Mobility Comments: indep no AD ADLs Comments: indep in all ADL/IADL, works in an office        OT Problem List: Decreased strength;Decreased activity tolerance;Impaired balance (sitting and/or standing);Decreased knowledge of use of DME or AE;Pain      OT Treatment/Interventions: Self-care/ADL training;Therapeutic exercise;Patient/family education;Balance training;Energy conservation;Therapeutic activities;DME and/or AE instruction    OT Goals(Current goals can be found in the care plan section) Acute Rehab OT Goals Patient Stated Goal: get stronger OT Goal Formulation: With patient/family Time For Goal Achievement: 04/12/22 Potential to Achieve Goals: Good ADL Goals Pt Will Perform Grooming: with modified independence;standing Pt Will Perform Upper Body Dressing: with modified independence Pt Will Perform Lower Body Dressing: with modified independence;with adaptive equipment Pt Will Transfer to Toilet: with modified independence;ambulating Pt Will Perform Toileting - Clothing Manipulation and hygiene: with modified independence;sit to/from stand  OT Frequency: Min 4X/week    Co-evaluation PT/OT/SLP Co-Evaluation/Treatment: Yes Reason for Co-Treatment: To address functional/ADL transfers;Complexity of the patient's impairments (multi-system involvement);For patient/therapist safety   OT goals addressed during session: ADL's and self-care      AM-PAC OT "6 Clicks" Daily Activity     Outcome Measure Help from another person  eating meals?: Total Help from another person taking care of personal grooming?: A Little Help from another person toileting, which includes using toliet, bedpan, or urinal?: A Lot Help from another person bathing (including washing, rinsing, drying)?: A Lot Help from another person to put on and taking off regular upper body clothing?: A Lot Help  from another person to put on and taking off regular lower body clothing?: A Lot 6 Click Score: 12   End of Session Equipment Utilized During Treatment: Rolling walker (2 wheels);Oxygen Nurse Communication: Mobility status  Activity Tolerance: Patient tolerated treatment well Patient left: in chair;with call bell/phone within reach;with family/visitor present  OT Visit Diagnosis: Muscle weakness (generalized) (M62.81);Unsteadiness on feet (R26.81)                Time: 9784-7841 OT Time Calculation (min): 29 min Charges:  OT General Charges $OT Visit: 1 Visit OT Evaluation $OT Eval High Complexity: 1 High  Shanon Payor, OTD OTR/L  03/29/22, 12:39 PM

## 2022-03-29 NOTE — Progress Notes (Signed)
Inpatient Rehab Admissions Coordinator:   Per OT recommendation, patient was screened for CIR candidacy by Dora Clauss, MS, CCC-SLP. At this time, Pt. Appears to be a a potential candidate for CIR. I will place   order for rehab consult per protocol for full assessment. Please contact me any with questions.  Jaydynn Wolford, MS, CCC-SLP Rehab Admissions Coordinator  [phone] (celll) [phone] (office)  

## 2022-03-29 NOTE — Consult Note (Signed)
[facility] Nurse ostomy follow up Stoma type/location: LLQ, end colostomy Stomal assessment/size: 1 1/8" x 1 3/8" slightly oval shaped, slightly budded; dusky; dips in the abdominal topography at 9 o'clock and from 5-7 o'clock  Peristomal assessment: intact  Treatment options for stomal/peristomal skin: 2" skin barrier ring Output bloody Ostomy pouching: 2pc. 2 3/4" with 2" skin barrier ring Education provided:  Met with patient, husband, mom Administrator, arts), mother in Sports coach (has experience with ostomy pouch changes) Explained role of ostomy nurse and creation of stoma  Explained stoma characteristics (budded, flush, color, texture, care) Demonstrated pouch change (cutting new skin barrier, measuring stoma, cleaning peristomal skin and stoma, use of barrier ring) Education on emptying when 1/3 to 1/2 full and how to empty Demonstrated use of wick to clean spout  Discussed bathing, diet, gas, medication use, constipation Patient and family all engaged to learn Ordered additional 2pc pouching systems and 1pc flex convex for patient's room. Encouraged patient/family to use supplies in the room to practice lock and roll opening/closure, ok to cut new skin barrier (they asked) using pattern from today.     Enrolled patient in [place] Start Discharge program: Yes  [facility] Nurse will follow along with you for continued support with ostomy teaching and care.  Visit planned with patient's husband for Monday am around 7am; as he is planning to return to work next week. Notified [facility] nurse team of same.   Rhyen Mazariego [facility] MSN, [place], [place], [place], [place]

## 2022-03-29 NOTE — Progress Notes (Signed)
[place] Hospital Day(s): 8.   Post op day(s): 2 Days Post-Op.   Interval History:  Patient seen and examined No acute events or new complaints overnight.  She remains hemodynamically stable; she has not required vasopressor support Patient reports she was actually able to sleep last night; still with abdominal soreness No fever, chills, nausea, emesis Leukocytosis stable this morning; 22.0K; this still likely remains stress response from surgery  Hgb to 9.2; suspect this is dilutional Renal function normal; sCr - 0.41; UO - 1365 ccs Hypokalemia to 3.3 NGT with 100 ccs out Surgical drain with 110 ccs; serous No ostomy function  Vital signs in last 24 hours: [min-max] current  Temp:  [98.1 F (36.7 C)-98.4 F (36.9 C)] 98.3 F (36.8 C) (08/04 0400) Pulse Rate:  [79-96] 90 (08/04 0600) Resp:  [13-24] 24 (08/04 0600) BP: (100-123)/(55-68) 106/66 (08/04 0600) SpO2:  [95 %-100 %] 100 % (08/04 0600)     Height: '5\' 3"'$  (160 cm) Weight:  (unable to obtain; bed malfunction) BMI (Calculated): 39.44   Intake/Output last 2 shifts:  08/03 0701 - 08/04 0700 In: 2312.6 [I.V.:1712.5; IV Piggyback:600.1] Out: 1575 [Urine:1365; Emesis/NG output:100; Drains:110]   Physical Exam:  Constitutional: alert, cooperative and no distress  HEENT: NGT in place; minimal output  Respiratory: breathing non-labored at rest  Cardiovascular: regular rate and sinus rhythm  Gastrointestinal: Soft, expected tenderness, non-distended, no rebound/guarding. Surgical drain in LLQ; output serous. 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; dusky, no output Integumentary: Midline wound healing via secondary intention, no erythema, tissues healthy, fascia intact  Labs:     Latest Ref Rng & Units 03/29/2022    4:22 AM 03/28/2022    5:12 AM 03/27/2022    7:47 AM  CBC  WBC 4.0 - 10.5 K/uL 22.0  22.0  17.5   Hemoglobin 12.0 - 15.0 g/dL 9.2  10.5  10.5   Hematocrit 36.0 - 46.0 % 29.1  32.8   32.2   Platelets 150 - 400 K/uL 197  183  161       Latest Ref Rng & Units 03/29/2022    4:22 AM 03/28/2022    5:12 AM 03/27/2022    8:09 PM  CMP  Glucose 70 - 99 mg/dL 148  221    BUN 6 - 20 mg/dL 9  10    Creatinine 0.44 - 1.00 mg/dL 0.41  0.50    Sodium 135 - 145 mmol/L 143  141    Potassium 3.5 - 5.1 mmol/L 3.3  3.7  3.5   Chloride 98 - 111 mmol/L 112  109    CO2 22 - 32 mmol/L 28  26    Calcium 8.9 - 10.3 mg/dL 7.5  7.3    Total Protein 6.5 - 8.1 g/dL 4.2  4.4    Total Bilirubin 0.3 - 1.2 mg/dL 2.1  2.8    Alkaline Phos 38 - 126 U/L 48  44    AST 15 - 41 U/L 32  31    ALT 0 - 44 U/L 18  19       Imaging studies: No new pertinent imaging studies   Assessment/Plan:  36 y.o. female awaiting ROBF otherwise doing well 2 Days Post-Op s/p exploratory laparotomy and Hartman's procedure for perforated sigmoid colon.   - Although no ostomy function yet, her NGT output remains very low and clear. I will get KUB for reassurance; however, I feel we can do clamping trial today. I clamped NGT at 0800.  Discussed with RN to check residuals around 1200. If residuals are <150 ccs, we may be able to remove NGT and initiate diet -vs- trial CLD with clamped NGT in place.    - Discontinue foley catheter today  - Continue NPO (okay for ice chips for comfort) + TPN; monitor electrolytes; replete K+  - Monitor colostomy function; this will likely get dusky; WOC RN following  - Wound Care: Wet to dry dressing changes daily with saline moistened Kerlix; cover and secure. As we get closer to discharge, I will see if she can get a wound vac for home.   - Continue surgical drain; monitor and record output   - Monitor abdominal examination - Pain control scheduled + prn; antiemetics prn - Monitor leukocytosis; stable; likely secondary to surgery - Plan to mobilize tomorrow; PT/OT on board for today  - Further management per primary service  - She can transfer to floor; I wrote orders   All of the above  findings and recommendations were discussed with the patient, patient's family (husband at bedside), and the medical team, and all of patient's and family's questions were answered to their expressed satisfaction.  -- [name], PA-C [facility] 03/29/2022, 7:30 AM M-F: 7am - 4pm

## 2022-03-29 NOTE — Progress Notes (Signed)
Patient has had an eventful day, patient's ng tube was clamped at 0800 and patient was up to the chair soon after with the assistance of PT, patient transferred back to the bed with 2 person assist.  Patient's residual was checked at 1300 and no residual from ng tube present.  Dr. Dahlia Byes also at that time in to see patient of which both decided to leave the ng tube for today and foley catheter, patient also to remain npo with ice chips. Patient is currently resting in her room with no complaints offered.

## 2022-03-30 DIAGNOSIS — T8143XA Infection following a procedure, organ and space surgical site, initial encounter: Secondary | ICD-10-CM | POA: Diagnosis not present

## 2022-03-30 LAB — GLUCOSE, CAPILLARY
Glucose-Capillary: 114 mg/dL — ABNORMAL HIGH (ref 70–99)
Glucose-Capillary: 114 mg/dL — ABNORMAL HIGH (ref 70–99)
Glucose-Capillary: 118 mg/dL — ABNORMAL HIGH (ref 70–99)
Glucose-Capillary: 141 mg/dL — ABNORMAL HIGH (ref 70–99)
Glucose-Capillary: 145 mg/dL — ABNORMAL HIGH (ref 70–99)

## 2022-03-30 LAB — BASIC METABOLIC PANEL
Anion gap: 9 (ref 5–15)
BUN: 11 mg/dL (ref 6–20)
CO2: 26 mmol/L (ref 22–32)
Calcium: 8.3 mg/dL — ABNORMAL LOW (ref 8.9–10.3)
Chloride: 106 mmol/L (ref 98–111)
Creatinine, Ser: 0.48 mg/dL (ref 0.44–1.00)
GFR, Estimated: >60 mL/min (ref >60)
Glucose, Bld: 127 mg/dL — ABNORMAL HIGH (ref 70–99)
Potassium: 3.9 mmol/L (ref 3.5–5.1)
Sodium: 141 mmol/L (ref 135–145)

## 2022-03-30 LAB — CBC
HCT: 32.3 % — ABNORMAL LOW (ref 36.0–46.0)
Hemoglobin: 10.1 g/dL — ABNORMAL LOW (ref 12.0–15.0)
MCH: 28.5 pg (ref 26.0–34.0)
MCHC: 31.3 g/dL (ref 30.0–36.0)
MCV: 91 fL (ref 80.0–100.0)
Platelets: 239 10*3/uL (ref 150–400)
RBC: 3.55 MIL/uL — ABNORMAL LOW (ref 3.87–5.11)
RDW: 14.7 % (ref 11.5–15.5)
WBC: 17 10*3/uL — ABNORMAL HIGH (ref 4.0–10.5)
nRBC: 0.4 % — ABNORMAL HIGH (ref 0.0–0.2)

## 2022-03-30 MED ORDER — CYCLOBENZAPRINE HCL 10 MG PO TABS
5.0000 mg | ORAL_TABLET | Freq: Three times a day (TID) | ORAL | Status: DC
  Administered 2022-03-30 – 2022-03-31 (×4): 5 mg via ORAL
  Filled 2022-03-30 (×4): qty 1

## 2022-03-30 MED ORDER — TRACE MINERALS CU-MN-SE-ZN 300-55-60-3000 MCG/ML IV SOLN
INTRAVENOUS | Status: AC
  Filled 2022-03-30: qty 720

## 2022-03-30 MED ORDER — OXYCODONE HCL 5 MG PO TABS
5.0000 mg | ORAL_TABLET | ORAL | Status: DC | PRN
  Administered 2022-03-30 – 2022-04-04 (×21): 5 mg via ORAL
  Filled 2022-03-30 (×21): qty 1

## 2022-03-30 MED ORDER — PANTOPRAZOLE SODIUM 40 MG PO TBEC
40.0000 mg | DELAYED_RELEASE_TABLET | Freq: Every day | ORAL | Status: DC
  Administered 2022-03-30 – 2022-04-10 (×12): 40 mg via ORAL
  Filled 2022-03-30 (×12): qty 1

## 2022-03-30 NOTE — Progress Notes (Signed)
PHARMACIST - PHYSICIAN COMMUNICATION  CONCERNING: IV to Oral Route Change Policy  RECOMMENDATION: This patient is receiving pantoprazole by the intravenous route.  Based on criteria approved by the Pharmacy and Therapeutics Committee, the intravenous medication(s) is/are being converted to the equivalent oral dose form(s).   DESCRIPTION: These criteria include: The patient is eating (either orally or via tube) and/or has been taking other orally administered medications for a least 24 hours The patient has no evidence of active gastrointestinal bleeding or impaired GI absorption (gastrectomy, short bowel, patient on TNA or NPO).  If you have questions about this conversion, please contact the [facility] 03/30/2022 12:45 PM

## 2022-03-30 NOTE — Progress Notes (Signed)
POD # 3 Tolerated ng clamping AVSS Wbc going down Creat nml  PE NAD, anxious Abd: soft, drain serous. No dehiscence. Ostomy improving slowly but no output  A/P Doing ok Ileus w some improvement, DC NG Only sips of clear DC foley Keep TPN Mobilize

## 2022-03-30 NOTE — Progress Notes (Signed)
Pt arrived to the unit from ICU. Alert and oriented. VS taken and stable Skin check done with Rhojina, RN. Noted to have NGT to R nar which is clamped. PICC RUA wiith TPN running at 75 cc/hr. Midline incision covered with abdominal dressing C/D/I. Colostomy to LLQ, no output. JP drain on LLQ. Foley catheter in place. Husband at bedside with patient, pleasant. Oriented pt of room environment. Will continue to monitor pt.

## 2022-03-30 NOTE — Progress Notes (Signed)
Occupational Therapy Treatment Patient Details Name: ANJALEE COPE MRN: [id number] DOB: 1985-12-10 Today's Date: 03/30/2022   History of present illness Pt is a 36 year old female s/p exploratory laparotomy, sigmoid colectomy with colostomy creation, Hartmann procedure, lysis of adhesions 03/27/22 after robotic adhesiolysis, left salpingectomy, drainage of 500 mL from large LEFT peritoneal cyst & chromotubation on 7/24. She was discharged home on 4/01 without complication. Pt returned to ED on 7/24 with sudden acute abdominal pain. Pt with severe sepsis, acute respiratory failure, postoperative ileus, posoperative intra-abdominal abscess. PMH significant for Infertility, previous gyn surgery for left ovarian cystectomy left for dermoid cyst complicated by cystotomy and bladder repair in 2007, pelvic pain underwent on 02/16/18 lap and extensive abdominopelvic adhesiolysis   OT comments  Chart reviewed to date, nurse cleared pt for participation in OT tx session. Co tx completed with PT on this date. Tx session targeted improving activity tolerance in preparation for increased independence during toilet transfer and standing ADL tasks. Improvements noted in bed mobility, requiring MIN A with HOB raised and use of bed rails, STS with CGA with RW on multiple attempts from elevated and non elevated surfaces. Pt completes grooming tasks in sitting with SET UP. Pt amb 5', then 20' following seated rest break with close chair follow. Education provided re; Economist, pain management utilizing breathing techniques during movement, importance of continuing to progress mobility in order to facilitate continued progress. Pt is motivated for all tx tasks. Pt is left in bedside chair, NAD, all needs met. OT will follow acutely    Recommendations for follow up therapy are one component of a multi-disciplinary discharge planning process, led by the attending physician.  Recommendations may be updated based on patient  status, additional functional criteria and insurance authorization.    Follow Up Recommendations  Acute inpatient rehab (3hours/day)    Assistance Recommended at Discharge Frequent or constant Supervision/Assistance  Patient can return home with the following  A lot of help with walking and/or transfers;A lot of help with bathing/dressing/bathroom   Equipment Recommendations  BSC/3in1;Tub/shower seat    Recommendations for Other Services      Precautions / Restrictions Precautions Precautions: Fall Precaution Comments: JP drain L abdomen, NG tube, foley, picc line, long abdominal incision; L UQ colostomy Restrictions Weight Bearing Restrictions: No       Mobility Bed Mobility Overal bed mobility: Needs Assistance Bed Mobility: Sidelying to Sit Rolling: Min assist, +2 for physical assistance         General bed mobility comments: HOB elevated    Transfers Overall transfer level: Needs assistance Equipment used: Rolling walker (2 wheels) Transfers: Sit to/from Stand Sit to Stand: Min guard, From elevated surface                 Balance Overall balance assessment: Needs assistance Sitting-balance support: Feet supported, Bilateral upper extremity supported Sitting balance-Leahy Scale: Fair     Standing balance support: During functional activity, Reliant on assistive device for balance, Bilateral upper extremity supported Standing balance-Leahy Scale: Fair                             ADL either performed or assessed with clinical judgement   ADL Overall ADL's : Needs assistance/impaired Eating/Feeding: NPO   Grooming: Wash/dry face;Sitting;Set up               Lower Body Dressing: Maximal assistance Lower Body Dressing Details (indicate cue type and reason):  socks in bed Toilet Transfer: Min guard;Rolling walker (2 wheels) Toilet Transfer Details (indicate cue type and reason): simulated to bedside chair         Functional  mobility during ADLs: Min guard;Rolling walker (2 wheels)      Extremity/Trunk Assessment              Vision       Perception     Praxis      Cognition Arousal/Alertness: Awake/alert Behavior During Therapy: WFL for tasks assessed/performed, Anxious Overall Cognitive Status: Within Functional Limits for tasks assessed                                          Exercises      Shoulder Instructions       General Comments      Pertinent Vitals/ Pain       Pain Assessment Pain Assessment: 0-10 Pain Score: 4  Pain Location: BLE, abdomen Pain Descriptors / Indicators: Discomfort, Grimacing, Guarding, Shooting Pain Intervention(s): Limited activity within patient's tolerance, Monitored during session, Repositioned  Home Living                                          Prior Functioning/Environment              Frequency  Min 4X/week        Progress Toward Goals  OT Goals(current goals can now be found in the care plan section)  Progress towards OT goals: Progressing toward goals     Plan Discharge plan remains appropriate    Co-evaluation    PT/OT/SLP Co-Evaluation/Treatment: Yes Reason for Co-Treatment: Other (comment) (2/2 pain/activity tolerance) PT goals addressed during session: Mobility/safety with mobility;Proper use of DME;Balance;Strengthening/ROM OT goals addressed during session: ADL's and self-care      AM-PAC OT "6 Clicks" Daily Activity     Outcome Measure   Help from another person eating meals?: Total Help from another person taking care of personal grooming?: A Little Help from another person toileting, which includes using toliet, bedpan, or urinal?: A Lot Help from another person bathing (including washing, rinsing, drying)?: A Lot Help from another person to put on and taking off regular upper body clothing?: A Lot Help from another person to put on and taking off regular lower body  clothing?: A Lot 6 Click Score: 12    End of Session Equipment Utilized During Treatment: Rolling walker (2 wheels);Oxygen  OT Visit Diagnosis: Muscle weakness (generalized) (M62.81);Unsteadiness on feet (R26.81)   Activity Tolerance Patient tolerated treatment well   Patient Left in chair;with call bell/phone within reach;with family/visitor present;with chair alarm set   Nurse Communication Mobility status        Time: 2951-8841 OT Time Calculation (min): 33 min  Charges: OT General Charges $OT Visit: 1 Visit OT Treatments $Self Care/Home Management : 8-22 mins  Shanon Payor, OTD OTR/L  03/30/22, 12:45 PM

## 2022-03-30 NOTE — Consult Note (Signed)
PHARMACY - TOTAL PARENTERAL NUTRITION CONSULT NOTE   Indication: Prolonged ileus  Patient Measurements: Height: '5\' 3"'$  (160 cm) Weight:  (unable to obtain; bed malfunction) IBW/kg (Calculated) : 52.4 TPN AdjBW (KG): 62.1 Body mass index is 39.43 kg/m.  Assessment:  Patient is [name] with medical history including recent laparoscopic adhesiolysis, left salpingectomy, drainage of peritoneal cyst, and chromotubation on 7/24 who presented to the ED 7/27 with severe pain in abdomen and vagina. Imaging concerning for post-operative abscess. Patient underwent CT drain placement into pelvic abscess on 7/28. Hospital course has been complicated by ileus. Pharmacy consulted to initiate TPN to provide nutrition given post-operative ileus.   Glucose / Insulin: BG 126-228 requiring 4 units insulin aspart SSI in the past 24 hours.  Insulin Glargine 10u HS added 8/3 Electrolytes: WNL Renal: Scr < 1 Hepatic: No transaminitis. Hyperbilirubinemia resolving Intake / Output; MIVF: JP drain left abdomen, NGT decompression, colostomy LUQ. Drain with minimal output. I&O: + 12L GI Imaging: 7/31 CT abdomen / pelvis: Percutaneous drainage catheter is seen in left-sided abdominal abscess. Mild proximal small bowel dilatation is noted which is slightly improved compared to prior exam, most consistent with ileus. Stable mild ascites is noted in the pelvis and right side of the abdomen. GI Surgeries / Procedures:  7/28: CT drain placement into pelvic abscess 8/2: Pending ExLap with general surgery  Central access: PICC Double Lumen placed 8/1 TPN start date: 8/1   Nutritional Goals: Goal TPN rate is 75 mL/hr (provides 108 g of protein and 2217 kcals per day)  RD Assessment: Estimated Needs Total Energy Estimated Needs: 2000-2300kcal/day Total Protein Estimated Needs: 100-115g/day Total Fluid Estimated Needs: 1.6-1.8L/day  Current Nutrition:  NPO  Plan:  --Continue TPN at 75 mL/hr (goal rate)   --Electrolytes in TPN: Na 34mq/L, K 578m/L, Ca 71m36mL, Mg 71mE93m, and Phos 171mm33m. Cl:Ac 1:1 -will slightly adjust KCL from 50 mEq/L to 55 mEq/L on 8/5 --Add standard MVI and trace elements to TPN, add thiamine 100 mg x 3 days (completed) --Continue Moderate q6h SSI and adjust as needed  -continue insulin glargine 10 units at bedtime --Monitor TPN labs on Mon/Thurs, daily until stable  Marliyah Reid A 03/30/2022,10:01 AM

## 2022-03-30 NOTE — Progress Notes (Signed)
GYNECOLOGY INPATIENT PROGRESS NOTE POD #12 s/p robotic assisted laparosocopic lysis of adhesions, left salpingectomy, drainage of large peritoneal cyst, chromotubation Hospital Day # 9, readmitted for suspected pelvic abscess, developing sepsis.   POD#8 s/p CT guided drainage of pelvic abscess  POD# 4, s/p exploratory laparotomy, colectomy with Hartmann's procedure  Subjective: Patient reports some pain, but not as bad as before. Knows that this is healing pain.  Is happy to be eating liquids today, excited about when she will be able to consume solids. Reports physical therapy is difficult, but she is "trying to get through it". Denies fevers, chills, chest pain, SOB.  Reports that her left hand and arm are significantly swollen. Denies major pain in arm.   Objective: Temp:  [98 F (36.7 C)-99.9 F (37.7 C)] 98.4 F (36.9 C) (08/05 0812) Pulse Rate:  [81-101] 98 (08/05 0812) Resp:  [14-23] 18 (08/05 0812) BP: (97-115)/(62-80) 112/67 (08/05 0812) SpO2:  [91 %-100 %] 91 % (08/05 0812)  I/O last 3 completed shifts: In: 4208.7 [I.V.:2678.5; Other:680; IV Piggyback:850.2] Out: 1595 [Urine:1465; Emesis/NG output:100; Drains:30] Total I/O In: 720.9 [I.V.:404.9; IV Piggyback:316] Out: 500 [Urine:500]    sodium chloride 10 mL/hr at 03/26/22 1939   ampicillin-sulbactam (UNASYN) IV 3 g (03/30/22 1703)   fluconazole (DIFLUCAN) IV 400 mg (03/30/22 1448)   TPN ADULT (ION)       Chlorhexidine Gluconate Cloth  6 each Topical Daily   cyclobenzaprine  5 mg Oral TID   heparin injection (subcutaneous)  5,000 Units Subcutaneous Q8H   insulin aspart  0-15 Units Subcutaneous Q6H   insulin glargine-yfgn  10 Units Subcutaneous QHS   ketorolac  30 mg Intravenous Q6H   pantoprazole  40 mg Oral QHS   sodium chloride flush  10-40 mL Intracatheter Q12H   sodium chloride flush  5 mL Intracatheter Q8H    Physical Exam:  General: alert and no distress  Lungs: clear to auscultation  bilaterally Heart: regular rate and rhythm, S1, S2 normal, no murmur, click, rub or gallop Abdomen: bandage appears clean/dry/intact Pelvis: Deferred Extremities: DVT Evaluation: No evidence of DVT seen on physical exam. SCDs in place.    Labs:     Latest Ref Rng & Units 03/30/2022    7:40 AM 03/29/2022    4:22 AM 03/28/2022    5:12 AM  CBC  WBC 4.0 - 10.5 K/uL 17.0  22.0  22.0   Hemoglobin 12.0 - 15.0 g/dL 10.1  9.2  10.5   Hematocrit 36.0 - 46.0 % 32.3  29.1  32.8   Platelets 150 - 400 K/uL 239  197  183         Latest Ref Rng & Units 03/30/2022    7:40 AM 03/29/2022    4:22 AM 03/28/2022    5:12 AM  CMP  Glucose 70 - 99 mg/dL 127  148  221   BUN 6 - 20 mg/dL '11  9  10   '$ Creatinine 0.44 - 1.00 mg/dL 0.48  0.41  0.50   Sodium 135 - 145 mmol/L 141  143  141   Potassium 3.5 - 5.1 mmol/L 3.9  3.3  3.7   Chloride 98 - 111 mmol/L 106  112  109   CO2 22 - 32 mmol/L '26  28  26   '$ Calcium 8.9 - 10.3 mg/dL 8.3  7.5  7.3   Total Protein 6.5 - 8.1 g/dL  4.2  4.4   Total Bilirubin 0.3 - 1.2 mg/dL  2.1  2.8   Alkaline Phos 38 - 126 U/L  48  44   AST 15 - 41 U/L  32  31   ALT 0 - 44 U/L  18  19      Assessment/Plan: Continuing to progress well post-operatively.  Current management per General Surgery and Hospitalist.  Will continue to follow as GYN.     A total of 25 minutes were spent during this encounter, including review of previous progress notes, recent imaging and labs, face-to-face with time with patient involving counseling and coordination of care, as well as documentation for current visit.   Rubie Maid, MD [facility]

## 2022-03-30 NOTE — Progress Notes (Signed)
Transferred to rm 4 with husband, vitals signs stable and no acute distress.

## 2022-03-30 NOTE — Progress Notes (Signed)
PROGRESS NOTE    Lacey Harrison  FWY:[id number] DOB: [birth year] DOA: 03/21/2022 PCP: Rubie Maid, MD   Brief Narrative:  This 36 yo Female presenting to [facility] ED from home on 03/21/22 with complaints of sudden onset severe abdominal pain. She recently on 03/18/22 had robotic adhesiolysis, left salpingectomy, drainage of 500 mL from large LEFT peritoneal cyst & chromotubation. She was discharged home on 2/77 without complication. She reported being on the floor with her cat and twisting her body around, with sudden acute abdominal pain. Patient complained of severe pain in lower abdomen radiating towards her vagina.  07/27: CT imaging abscess. Leukocytosis with left shift at 16.8. Initiated on IV Zosyn and Doxycycline. Admitted to OBGYN.  07/28: IR procedure 07/28 CT drain placement into pelvic abscess, left anterior approach, 12 Fr to bulb suction. Patient developed severe sepsis/septic shock after drainage of abdominal pelvic abscess.  She required transfer to stepdown unit, dyspnea worsened with desaturations noted, due to concerns for respiratory status PCCM consulted, receiving BS abx w/ zosyn, doxycycline & vancomycin, volume resuscitation w/ IVF, awaiting culture results   07/29: Remains in SDU w/ ileus and sepsis. IV antibiotics: Continue Zosyn, added fluconazole due to budding yeast noted on abscess fluid, Discontinue doxycycline/vancomycin x1, continue IVF hydration - bicarb infusion, DC other IV fluids due to anasarca. Developing ileus, query feculent appearance on abscess drain, repeat CT abdomen pelvis performed, new loculations on the right, anasarca, distended urinary bladder. No contrast extravasation to suggest bowel perforation 07/30: Continues to have abdominal pain, required NG last night due to recurrent vomiting.  Had 1200 mL of bilious material suctioned from stomach.  NG in place, abdomen less distended. IV antibiotics: Continue Zosyn, fluconazole, added Clindamycin d/t clostridium  prefringens noted on culture.  07/31: repeat CT The abscess is slightly decreased compared to prior exam. Continuing on Unasyn and Diflucan.  08/01: VSS, WBC trending down, BCx NGx4d. Ileus no better. Drain in place, minimal output. NG in place. PICC and TPN initiated. Spoke w/ GYN, plan for surgical consult and ID consult. Pt is in significant pain though lab/VS parameters and imaging show improvement, suspect pain more d/t ileus. 8/02: Patient continued to have persistently elevated white cell count with abdominal pain.  General surgery has scheduled exploratory laparotomy. 8/03: Exploratory laparotomy, sigmoid colectomy with colostomy creation, Hartmann procedure, lysis of additions.  Continue TPN and IV antibiotics. 08/04: Leukocytosis persist could be due to stress response.  Remains afebrile, continue NPO. and TPN, wound care consult, colostomy education.  Assessment & Plan:   Principal Problem:   Postoperative intra-abdominal abscess Active Problems:   Acute respiratory failure with hypoxia (HCC) in seting of MSKpain, atelectasis, severe sepsis    Severe sepsis (HCC) without septic shock, d/t postoperative abscess, s/p pelvic drain placement    Ileus, postoperative (HCC)   AKI (acute kidney injury) ([place]) in setting of sepsis, IV contrast, NAGMA - RESOLVED    Abdominal pain   Severe sepsis ([place]) without septic shock, d/t postoperative abscess, s/p pelvic drain placement : Cultures growing budding yeast, EColi and Clostridium --> Continue IV abx: Unasyn and Diflucan Abscess shows some improvement on CT on 07/30 --> repeat imaging showed worsening  Monitor CBC and VS for sepsis parameters --> no improvement. Patient underwent  Exploratory laparotomy, sigmoid colectomy with colostomy creation, Hartmann procedure, lysis of additions. POD # 3 WBC remains elevated.  As per ID if it continues to trend up,  consider changing Unasyn to meropenem.. Wbc trended down, continue unasyn  Acute  respiratory failure with hypoxia (HCC) in seting of MSK pain, atelectasis, severe sepsis  Continue Supplemental O2 prn goal >90%, weaned down to room air. Continue bronchodilators  Incentive spirometry    AKI (acute kidney injury) ([place]) in setting of sepsis, IV contrast, NAGMA - RESOLVED  Trend BMP I&O close monitor UOP   Ileus, postoperative ([place]) NG in place PICC and TPN placed on 03/26/22  NPO for now, ice chips as needed. General surgery following Postoperative day 3.       Exploratory laparotomy, sigmoid colectomy with colostomy creation, Hartmann procedure, lysis of additions.   Postoperative intra-abdominal abscess GYN following ID and general surgery consulted Leukocytosis worsening, she underwent exploratory laparotomy. Leukocytosis persisted could be stress reaction.  Continue to monitor. Leucocytosis improved, trending down now.   Abdominal pain Suspect related more to ileus since infection parameters are improving Discussed would like to avoid opiates if possible as this will not improve ileus Trial switching morphine to Dilaudid low-dose today Patient encouraged out of bed/walking   Hypokalemia: Replaced.  Improved.  DVT prophylaxis: heparin Code Status: Full code. Family Communication: Family/ mother at bed side. Disposition Plan:  Patient underwent exploratory laparotomy, colectomy and colostomy creation,.  PT recommended acute inpatient rehab.  Consultants:  PCCM OB/GYN IR General surgery Infectious diseases  Procedures:  07/28 CT drain placement into pelvic abscess 07/30 NG tube placed  07/31 PICC line.  Antimicrobials:   Anti-infectives (From admission, onward)    Start     Dose/Rate Route Frequency Ordered Stop   03/25/22 1600  Ampicillin-Sulbactam (UNASYN) 3 g in sodium chloride 0.9 % 100 mL IVPB        3 g 200 mL/hr over 30 Minutes Intravenous Every 6 hours 03/25/22 1134     03/24/22 2200  clindamycin (CLEOCIN) IVPB 900 mg  Status:   Discontinued        900 mg 100 mL/hr over 30 Minutes Intravenous Every 8 hours 03/24/22 1854 03/25/22 1134   03/23/22 1500  fluconazole (DIFLUCAN) IVPB 400 mg        400 mg 100 mL/hr over 120 Minutes Intravenous Every 24 hours 03/23/22 1358     03/22/22 1851  vancomycin variable dose per unstable renal function (pharmacist dosing)  Status:  Discontinued         Does not apply See admin instructions 03/22/22 1851 03/23/22 1051   03/22/22 1645  vancomycin (VANCOCIN) IVPB 1000 mg/200 mL premix  Status:  Discontinued        1,000 mg 200 mL/hr over 60 Minutes Intravenous  Once 03/22/22 1553 03/22/22 1558   03/22/22 1645  vancomycin (VANCOREADY) IVPB 2000 mg/400 mL        2,000 mg 200 mL/hr over 120 Minutes Intravenous  Once 03/22/22 1558 03/22/22 1814   03/22/22 0400  piperacillin-tazobactam (ZOSYN) IVPB 3.375 g  Status:  Discontinued        3.375 g 12.5 mL/hr over 240 Minutes Intravenous Every 8 hours 03/21/22 2047 03/25/22 1134   03/21/22 2200  doxycycline (VIBRAMYCIN) 100 mg in sodium chloride 0.9 % 250 mL IVPB  Status:  Discontinued        100 mg 125 mL/hr over 120 Minutes Intravenous Every 12 hours 03/21/22 2047 03/23/22 1358   03/21/22 1900  piperacillin-tazobactam (ZOSYN) IVPB 3.375 g        3.375 g 12.5 mL/hr over 240 Minutes Intravenous STAT 03/21/22 1853 03/21/22 2300        Subjective: Patient was seen and examined at bedside.  Overnight events noted.   Patient reports feeling much better today. She underwent exploratory laparotomy with colostomy creation.  POD # 3. Patient remains on TPN.  She still has NG tube with low intermittent suction.  Objective: Vitals:   03/30/22 0500 03/30/22 0600 03/30/22 0647 03/30/22 0812  BP: 100/72 97/72 108/68 112/67  Pulse: 94 85 95 98  Resp:   20 18  Temp:   98 F (36.7 C) 98.4 F (36.9 C)  TempSrc:   Oral Oral  SpO2: 98% 95% 96% 91%  Weight:      Height:        Intake/Output Summary (Last 24 hours) at 03/30/2022 1037 Last data  filed at 03/30/2022 2122 Gross per 24 hour  Intake 3124.84 ml  Output 560 ml  Net 2564.84 ml   Filed Weights   03/21/22 1722 03/26/22 1800 03/27/22 0700  Weight: 91 kg 98 kg 101 kg    Examination:  General exam: Sitting in the chair, comfortable not in any acute distress. NG tube connected with suction. Respiratory system: CTA bilaterally, respiratory effort normal.  No wheezing, no crackles. Cardiovascular system: S1 & S2 heard, regular rate and rhythm, no murmur. Gastrointestinal system: Soft, mildly tender, distended, colostomy noted with brownish stool.  Bowel sounds: Sluggish. Central nervous system: Alert and oriented X 3. No focal neurological deficits. Extremities: No edema, no cyanosis, no clubbing. Skin: No rashes, lesions or ulcers Psychiatry: Judgement and insight appear normal. Mood & affect appropriate.     Data Reviewed: I have personally reviewed following labs and imaging studies  CBC: Recent Labs  Lab 03/26/22 0622 03/27/22 0747 03/28/22 0512 03/29/22 0422 03/30/22 0740  WBC 10.8* 17.5* 22.0* 22.0* 17.0*  NEUTROABS 7.5  --   --   --   --   HGB 11.1* 10.5* 10.5* 9.2* 10.1*  HCT 33.0* 32.2* 32.8* 29.1* 32.3*  MCV 86.4 87.3 89.4 91.2 91.0  PLT 165 161 183 197 482   Basic Metabolic Panel: Recent Labs  Lab 03/25/22 0353 03/26/22 0622 03/27/22 0747 03/27/22 2009 03/28/22 0512 03/29/22 0422 03/30/22 0740  NA 135 137 140  --  141 143 141  K 3.0* 3.7 2.9* 3.5 3.7 3.3* 3.9  CL 97* 102 105  --  109 112* 106  CO2 '30 25 30  '$ --  '26 28 26  '$ GLUCOSE 81 72 126*  --  221* 148* 127*  BUN '14 18 12  '$ --  '10 9 11  '$ CREATININE 0.82 0.53 0.51  --  0.50 0.41* 0.48  CALCIUM 7.6* 8.0* 7.8*  --  7.3* 7.5* 8.3*  MG 2.4 2.3 1.9  --  2.0 1.7  --   PHOS 2.2* 4.8* 3.2  --  2.7 3.4  --    GFR: Estimated Creatinine Clearance: 110.2 mL/min (by C-G formula based on SCr of 0.48 mg/dL). Liver Function Tests: Recent Labs  Lab 03/24/22 0447 03/25/22 0353 03/27/22 0747  03/28/22 0512 03/29/22 0422  AST 20 18 34 31 32  ALT '21 14 17 19 18  '$ ALKPHOS 62 49 56 44 48  BILITOT 3.3* 3.4* 4.5* 2.8* 2.1*  PROT 5.6* 5.1* 5.3* 4.4* 4.2*  ALBUMIN 2.4* 2.1* 2.2* 2.1* 1.8*   No results for input(s): "LIPASE", "AMYLASE" in the last 168 hours.  No results for input(s): "AMMONIA" in the last 168 hours. Coagulation Profile: No results for input(s): "INR", "PROTIME" in the last 168 hours.  Cardiac Enzymes: No results for input(s): "CKTOTAL", "CKMB", "CKMBINDEX", "TROPONINI" in the last 168 hours. BNP (  last 3 results) No results for input(s): "PROBNP" in the last 8760 hours. HbA1C: No results for input(s): "HGBA1C" in the last 72 hours. CBG: Recent Labs  Lab 03/29/22 0536 03/29/22 1149 03/29/22 1856 03/29/22 2320 03/30/22 0529  GLUCAP 132* 130* 138* 123* 145*   Lipid Profile: Recent Labs    03/28/22 0512  TRIG 298*   Thyroid Function Tests: No results for input(s): "TSH", "T4TOTAL", "FREET4", "T3FREE", "THYROIDAB" in the last 72 hours. Anemia Panel: No results for input(s): "VITAMINB12", "FOLATE", "FERRITIN", "TIBC", "IRON", "RETICCTPCT" in the last 72 hours. Sepsis Labs: Recent Labs  Lab 03/24/22 0447 03/24/22 0908  PROCALCITON 16.44  --   LATICACIDVEN  --  1.3    Recent Results (from the past 240 hour(s))  Culture, blood (x 2)     Status: None   Collection Time: 03/22/22 10:48 AM   Specimen: BLOOD  Result Value Ref Range Status   Specimen Description BLOOD BLOOD LEFT HAND  Final   Special Requests   Final    BOTTLES DRAWN AEROBIC AND ANAEROBIC Blood Culture adequate volume   Culture   Final    NO GROWTH 5 DAYS Performed at [facility], [address] [place], [place] [address]    Report Status 03/27/2022 FINAL  Final  Culture, blood (x 2)     Status: None   Collection Time: 03/22/22 11:00 AM   Specimen: BLOOD  Result Value Ref Range Status   Specimen Description BLOOD BLOOD RIGHT HAND  Final   Special Requests AEROBIC  BOTTLE ONLY Blood Culture adequate volume  Final   Culture   Final    NO GROWTH 5 DAYS Performed at [facility], [address], [place], [place] [address]    Report Status 03/27/2022 FINAL  Final  Aerobic/Anaerobic Culture w Gram Stain (surgical/deep wound)     Status: None   Collection Time: 03/22/22 12:57 PM   Specimen: Wound; Abscess  Result Value Ref Range Status   Specimen Description   Final    WOUND Performed at [facility], [address], [place], [place] [address]    Special Requests   Final    ABD ABSCESS Performed at [facility], [address] [place], [place] [address]    Gram Stain   Final    ABUNDANT WBC PRESENT,BOTH PMN AND MONONUCLEAR ABUNDANT GRAM NEGATIVE RODS FEW GRAM POSITIVE RODS FEW GRAM POSITIVE COCCI IN PAIRS FEW BUDDING YEAST SEEN    Culture   Final    ABUNDANT ESCHERICHIA COLI MODERATE CLOSTRIDIUM PERFRINGENS Standardized susceptibility testing for this organism is not available. ABUNDANT BACTEROIDES VULGATUS BETA LACTAMASE NEGATIVE Performed at [facility], [place] [address], [place], [place] [address]    Report Status 03/25/2022 FINAL  Final   Organism ID, Bacteria ESCHERICHIA COLI  Final      Susceptibility   Escherichia coli - MIC*    AMPICILLIN 8 SENSITIVE Sensitive     CEFAZOLIN <=4 SENSITIVE Sensitive     CEFEPIME <=0.12 SENSITIVE Sensitive     CEFTAZIDIME <=1 SENSITIVE Sensitive     CEFTRIAXONE <=0.25 SENSITIVE Sensitive     CIPROFLOXACIN <=0.25 SENSITIVE Sensitive     GENTAMICIN <=1 SENSITIVE Sensitive     IMIPENEM <=0.25 SENSITIVE Sensitive     TRIMETH/SULFA <=20 SENSITIVE Sensitive     AMPICILLIN/SULBACTAM <=2 SENSITIVE Sensitive     PIP/TAZO <=4 SENSITIVE Sensitive     * ABUNDANT ESCHERICHIA COLI  MRSA Next Gen by PCR, Nasal  Status: None   Collection Time: 03/22/22 11:00 PM   Specimen: Nasal Mucosa; Nasal Swab  Result Value Ref Range Status   MRSA by PCR Next Gen NOT DETECTED NOT  DETECTED Final    Comment: (NOTE) The GeneXpert MRSA Assay (FDA approved for NASAL specimens only), is one component of a comprehensive MRSA colonization surveillance program. It is not intended to diagnose MRSA infection nor to guide or monitor treatment for MRSA infections. Test performance is not FDA approved in patients less than 44 years old. Performed at [facility], [address], [place], [place] [address]   Urine Culture     Status: None   Collection Time: 03/24/22  6:02 PM   Specimen: Urine, Catheterized  Result Value Ref Range Status   Specimen Description   Final    URINE, CATHETERIZED Performed at [facility], [address], [place], [place] [address]    Special Requests   Final    Normal Performed at [facility], [address] [place], [place] [address]    Culture   Final    NO GROWTH Performed at [facility], [place] [address], [place],  [address]    Report Status 03/25/2022 FINAL  Final   Radiology Studies: DG Abd Portable 1V  Result Date: 03/29/2022 CLINICAL DATA:  Ileus. EXAM: PORTABLE ABDOMEN - 1 VIEW COMPARISON:  Abdominal radiographs 03/27/2022. FINDINGS: Unchanged position of an enteric tube which coils in the stomach with tip in the expected location of the gastric cardia. A surgical drain projects over the lower abdomen/upper pelvis. Persistent contrast within the colon and rectum. Previously demonstrated extraluminal contrast within the pelvis is no longer well appreciated. Persistent mild air distension of small bowel loops, although this has decreased as compared to the prior abdominal radiographs of 03/27/2022. IMPRESSION: Persistent although decreased mild air distention of small-bowel loops. Residual contrast within the colon and rectum. Findings are compatible with the provided history of ileus. Previously demonstrated extraluminal contrast within the pelvis is no longer well appreciated. Unchanged position  of an enteric tube with tip terminating in the region of the gastric cardia. Electronically Signed   By: Kellie Simmering D.O.   On: 03/29/2022 08:40    Scheduled Meds:  Chlorhexidine Gluconate Cloth  6 each Topical Daily   heparin injection (subcutaneous)  5,000 Units Subcutaneous Q8H   insulin aspart  0-15 Units Subcutaneous Q6H   insulin glargine-yfgn  10 Units Subcutaneous QHS   ketorolac  30 mg Intravenous Q6H   pantoprazole (PROTONIX) IV  40 mg Intravenous QHS   sodium chloride flush  10-40 mL Intracatheter Q12H   sodium chloride flush  5 mL Intracatheter Q8H   Continuous Infusions:  sodium chloride 10 mL/hr at 03/26/22 1939   ampicillin-sulbactam (UNASYN) IV 3 g (03/30/22 0410)   fluconazole (DIFLUCAN) IV Stopped (03/29/22 1834)   TPN ADULT (ION) 75 mL/hr at 03/30/22 0607   TPN ADULT (ION)       LOS: 9 days    Time spent: 35 mins    Adams Hinch, MD [facility]   If 7PM-7AM, please contact night-coverage

## 2022-03-30 NOTE — Progress Notes (Signed)
Physical Therapy Treatment Patient Details Name: Lacey Harrison MRN: [id number] DOB: 14-Mar-1986 Today's Date: 03/30/2022   History of Present Illness Pt is a 36 y.o. female presenting to hospital post op day 3 from robotic assisted laparoscopic surgery (lysis of adhesions, left salpingectomy, drainage of large peritoneal cyst, chromotubation 7/24) c/o sudden onset of severe abdominal pain.  CT drain placement into pelvic abscess 7/28.  Pt developed severe sepsis/septic shock after drainage of abdominal pelvic abscess and transferred to ICU.  Pt also noted with acute hypoxic respiratory distress and AKI; developing ileus.  S/p 8/2 exploratory laparotomy, sigmoid colectomy with colostomy creation, and Hartman's procedure for perforated sigmoid colon.  PMH includes smoking, anxiety, lyme disease, migraines, PNA, chromopertubation, cystectomy L, infertility, previous surgery for L ovarian cystectomy L for dermoid cyst complicated by cystotomy and bladder repair in 2007, pelvic pain (underwent 02/16/18 lap and extensive abdominopelvic adhesiolysis).    PT Comments    Pt seen for PT tx with co-tx with OT. Pt endorses significant pain throughout session but is motivated to participate. Pt completes supine>sidelying>sitting with min assist but relies heavily on hospital bed features as pt uprights HOB almost fully, uses bed rails & requires significantly extra time. Pt with decreased speed of movement with all tasks involving core activation (ex: anterior weight shift forward in recliner, bed mobility) & requires assistance at times to shift forward in preparation for transfers. Pt is able to complete STS from EOB & recliner with CGA & increase ambulation distances to 5 ft + 20 ft with chair follow for safety. Pt is very motivated & eager to get better. Will continue to follow pt acutely to address balance, endurance & strength, and gait with LRAD.    Recommendations for follow up therapy are one component of a  multi-disciplinary discharge planning process, led by the attending physician.  Recommendations may be updated based on patient status, additional functional criteria and insurance authorization.  Follow Up Recommendations  Acute inpatient rehab (3hours/day)     Assistance Recommended at Discharge Frequent or constant Supervision/Assistance  Patient can return home with the following Assistance with cooking/housework;Assist for transportation;Help with stairs or ramp for entrance;A lot of help with walking and/or transfers;A lot of help with bathing/dressing/bathroom   Equipment Recommendations  Rolling walker (2 wheels);BSC/3in1;Wheelchair (measurements PT);Wheelchair cushion (measurements PT);Hospital bed    Recommendations for Other Services Rehab consult     Precautions / Restrictions Precautions Precautions: Fall Precaution Comments: JP drain L abdomen, NG tube, foley, picc line, long abdominal incision; L UQ colostomy Restrictions Weight Bearing Restrictions: No     Mobility  Bed Mobility Overal bed mobility: Needs Assistance Bed Mobility: Rolling, Sidelying to Sit Rolling: Min assist Sidelying to sit: Min assist, HOB elevated       General bed mobility comments: HOB significantly elevated (almost fully upright) with use of bed rails & extra time 2/2 pain    Transfers Overall transfer level: Needs assistance Equipment used: Rolling walker (2 wheels) Transfers: Sit to/from Stand, Bed to chair/wheelchair/BSC Sit to Stand: Min guard   Step pivot transfers: Min guard       General transfer comment: cuing & good demo of hand placement to push to standing & reach back before sitting    Ambulation/Gait Ambulation/Gait assistance: Min guard, +2 safety/equipment Gait Distance (Feet): 5 Feet (+ 20 ft) Assistive device: Rolling walker (2 wheels) Gait Pattern/deviations: Decreased step length - right, Decreased step length - left, Decreased stride length Gait velocity:  decreased  General Gait Details: +2 for chair follow for safety, pt able to complete 2 turns, PT provides cuing re: ambulating within base of AD which increases upright posture but pt states this causes more pain at this time   Stairs             Wheelchair Mobility    Modified Rankin (Stroke Patients Only)       Balance Overall balance assessment: Needs assistance Sitting-balance support: Feet supported, Bilateral upper extremity supported Sitting balance-Leahy Scale: Fair Sitting balance - Comments: steady static sitting   Standing balance support: During functional activity, Reliant on assistive device for balance, Bilateral upper extremity supported Standing balance-Leahy Scale: Fair                              Cognition Arousal/Alertness: Awake/alert Behavior During Therapy: WFL for tasks assessed/performed, Anxious Overall Cognitive Status: Within Functional Limits for tasks assessed                                 General Comments: Pleasant, motivated        Exercises Other Exercises Other Exercises: PT performs BLE heel cord stretching & educates pt's husband on assisting her with stretching throughout the day (30-60 sec hold, 3-5 reps) 2/2 pt c/o tight calves    General Comments        Pertinent Vitals/Pain Pain Assessment Pain Assessment: Faces Faces Pain Scale: Hurts whole lot Pain Location: abdomen, BLE Pain Descriptors / Indicators: Discomfort, Grimacing, Guarding, Shooting Pain Intervention(s): Monitored during session, Repositioned, Limited activity within patient's tolerance    Home Living                          Prior Function            PT Goals (current goals can now be found in the care plan section) Acute Rehab PT Goals Patient Stated Goal: to improve pain and mobility PT Goal Formulation: With patient/family Time For Goal Achievement: 04/12/22 Potential to Achieve Goals: Good Progress  towards PT goals: Progressing toward goals    Frequency    7X/week      PT Plan Current plan remains appropriate    Co-evaluation PT/OT/SLP Co-Evaluation/Treatment: Yes Reason for Co-Treatment: Other (comment) (2/2 pain/activity tolerance) PT goals addressed during session: Mobility/safety with mobility;Proper use of DME;Balance;Strengthening/ROM        AM-PAC PT "6 Clicks" Mobility   Outcome Measure  Help needed turning from your back to your side while in a flat bed without using bedrails?: A Lot Help needed moving from lying on your back to sitting on the side of a flat bed without using bedrails?: A Lot Help needed moving to and from a bed to a chair (including a wheelchair)?: A Little Help needed standing up from a chair using your arms (e.g., wheelchair or bedside chair)?: A Little Help needed to walk in hospital room?: A Lot Help needed climbing 3-5 steps with a railing? : A Lot 6 Click Score: 14    End of Session   Activity Tolerance: Patient tolerated treatment well;Patient limited by pain Patient left: in chair;with call bell/phone within reach;with family/visitor present Nurse Communication: Mobility status PT Visit Diagnosis: Other abnormalities of gait and mobility (R26.89);Muscle weakness (generalized) (M62.81);Pain Pain - part of body:  (abdomen)     Time: 4132-4401 PT Time Calculation (  min) (ACUTE ONLY): 33 min  Charges:  $Therapeutic Activity: 8-22 mins                     Lavone Nian, PT, DPT 03/30/22, 9:18 AM   Waunita Schooner 03/30/2022, 9:16 AM

## 2022-03-31 ENCOUNTER — Inpatient Hospital Stay: Payer: No Typology Code available for payment source

## 2022-03-31 DIAGNOSIS — T8143XA Infection following a procedure, organ and space surgical site, initial encounter: Secondary | ICD-10-CM | POA: Diagnosis not present

## 2022-03-31 LAB — CBC
HCT: 28.3 % — ABNORMAL LOW (ref 36.0–46.0)
Hemoglobin: 8.8 g/dL — ABNORMAL LOW (ref 12.0–15.0)
MCH: 28.4 pg (ref 26.0–34.0)
MCHC: 31.1 g/dL (ref 30.0–36.0)
MCV: 91.3 fL (ref 80.0–100.0)
Platelets: 275 10*3/uL (ref 150–400)
RBC: 3.1 MIL/uL — ABNORMAL LOW (ref 3.87–5.11)
RDW: 14.6 % (ref 11.5–15.5)
WBC: 18 10*3/uL — ABNORMAL HIGH (ref 4.0–10.5)
nRBC: 0.6 % — ABNORMAL HIGH (ref 0.0–0.2)

## 2022-03-31 LAB — BASIC METABOLIC PANEL
Anion gap: 7 (ref 5–15)
BUN: 11 mg/dL (ref 6–20)
CO2: 26 mmol/L (ref 22–32)
Calcium: 7.9 mg/dL — ABNORMAL LOW (ref 8.9–10.3)
Chloride: 106 mmol/L (ref 98–111)
Creatinine, Ser: 0.46 mg/dL (ref 0.44–1.00)
GFR, Estimated: >60 mL/min (ref >60)
Glucose, Bld: 105 mg/dL — ABNORMAL HIGH (ref 70–99)
Potassium: 4.3 mmol/L (ref 3.5–5.1)
Sodium: 139 mmol/L (ref 135–145)

## 2022-03-31 LAB — GLUCOSE, CAPILLARY
Glucose-Capillary: 117 mg/dL — ABNORMAL HIGH (ref 70–99)
Glucose-Capillary: 120 mg/dL — ABNORMAL HIGH (ref 70–99)
Glucose-Capillary: 102 mg/dL — ABNORMAL HIGH (ref 70–99)
Glucose-Capillary: 120 mg/dL — ABNORMAL HIGH (ref 70–99)

## 2022-03-31 LAB — MAGNESIUM: Magnesium: 2 mg/dL (ref 1.7–2.4)

## 2022-03-31 MED ORDER — IBUPROFEN 400 MG PO TABS
400.0000 mg | ORAL_TABLET | Freq: Four times a day (QID) | ORAL | Status: DC | PRN
  Administered 2022-03-31: 400 mg via ORAL
  Filled 2022-03-31: qty 1

## 2022-03-31 MED ORDER — CYCLOBENZAPRINE HCL 5 MG PO TABS
7.5000 mg | ORAL_TABLET | Freq: Three times a day (TID) | ORAL | Status: DC
  Administered 2022-03-31 – 2022-04-03 (×11): 7.5 mg via ORAL
  Filled 2022-03-31: qty 1
  Filled 2022-03-31 (×12): qty 1.5

## 2022-03-31 MED ORDER — PREGABALIN 50 MG PO CAPS
100.0000 mg | ORAL_CAPSULE | Freq: Three times a day (TID) | ORAL | Status: DC
  Administered 2022-03-31 – 2022-04-03 (×12): 100 mg via ORAL
  Filled 2022-03-31 (×12): qty 2

## 2022-03-31 MED ORDER — TRACE MINERALS CU-MN-SE-ZN 300-55-60-3000 MCG/ML IV SOLN
INTRAVENOUS | Status: AC
  Filled 2022-03-31: qty 720

## 2022-03-31 NOTE — Progress Notes (Signed)
1810: Patient's temp 100.3, HR 108 MD Pabon and MD Dwyane Dee made aware. New orders placed see MAR.  Patient was able to use BSC about 3 times during shift with assistance.  No output noted on ostomy or JP drain.

## 2022-03-31 NOTE — Progress Notes (Signed)
GYNECOLOGY INPATIENT PROGRESS NOTE POD #13 s/p robotic assisted laparosocopic lysis of adhesions, left salpingectomy, drainage of large peritoneal cyst, chromotubation Hospital Day # 10, readmitted for suspected pelvic abscess, developing sepsis.   POD#9 s/p CT guided drainage of pelvic abscess  POD# 5, s/p exploratory laparotomy, colectomy with Hartmann's procedure  Subjective: Patient sitting up to chair. Reports that she is tolerating liquid diet.  Reports her left arm swelling has improved since yesterday.  Reports that her incision has been leaking since yesterday. Dressing changed last night and again this morning.  Family members present in room visiting.   Objective: Temp:  [98.1 F (36.7 C)-98.4 F (36.9 C)] 98.4 F (36.9 C) (08/06 0802) Pulse Rate:  [99-101] 99 (08/06 0802) Resp:  [18-20] 20 (08/06 0802) BP: (97-112)/(58-76) 97/58 (08/06 0802) SpO2:  [93 %-95 %] 95 % (08/06 0802) Weight:  [104.4 kg] 104.4 kg (08/06 0500)  I/O last 3 completed shifts: In: 2521.4 [I.V.:1525.5; Other:680; IV Piggyback:316] Out: 9024 [Urine:1300; Drains:25] No intake/output data recorded.    sodium chloride 10 mL/hr at 03/26/22 1939   ampicillin-sulbactam (UNASYN) IV 3 g (03/31/22 0904)   fluconazole (DIFLUCAN) IV Stopped (03/30/22 1648)   TPN ADULT (ION) 75 mL/hr at 03/31/22 0247   TPN ADULT (ION)        Chlorhexidine Gluconate Cloth  6 each Topical Daily   cyclobenzaprine  5 mg Oral TID   heparin injection (subcutaneous)  5,000 Units Subcutaneous Q8H   insulin aspart  0-15 Units Subcutaneous Q6H   insulin glargine-yfgn  10 Units Subcutaneous QHS   ketorolac  30 mg Intravenous Q6H   pantoprazole  40 mg Oral QHS   pregabalin  100 mg Oral TID   sodium chloride flush  10-40 mL Intracatheter Q12H   sodium chloride flush  5 mL Intracatheter Q8H    Physical Exam:  General: alert and no distress  Lungs: clear to auscultation bilaterally Heart: regular rate and rhythm, S1, S2  normal, no murmur, click, rub or gallop Abdomen: bandage appears to be slightly saturated at base of incision, clear drainage. Bandage removed, wet to dry dressings in place, new bandage placed and reinforced. Some mild pitting edema noted at base of abdomen near incision site. Ostomy site with small amount of sanguinous drainage. Pelvis: Deferred Extremities: DVT Evaluation: No evidence of DVT seen on physical exam. SCDs in place. Left arm    Labs:     Latest Ref Rng & Units 03/31/2022    6:11 AM 03/30/2022    7:40 AM 03/29/2022    4:22 AM  CBC  WBC 4.0 - 10.5 K/uL 18.0  17.0  22.0   Hemoglobin 12.0 - 15.0 g/dL 8.8  10.1  9.2   Hematocrit 36.0 - 46.0 % 28.3  32.3  29.1   Platelets 150 - 400 K/uL 275  239  197         Latest Ref Rng & Units 03/31/2022    6:11 AM 03/30/2022    7:40 AM 03/29/2022    4:22 AM  CMP  Glucose 70 - 99 mg/dL 105  127  148   BUN 6 - 20 mg/dL '11  11  9   '$ Creatinine 0.44 - 1.00 mg/dL 0.46  0.48  0.41   Sodium 135 - 145 mmol/L 139  141  143   Potassium 3.5 - 5.1 mmol/L 4.3  3.9  3.3   Chloride 98 - 111 mmol/L 106  106  112   CO2 22 - 32  mmol/L '26  26  28   '$ Calcium 8.9 - 10.3 mg/dL 7.9  8.3  7.5   Total Protein 6.5 - 8.1 g/dL   4.2   Total Bilirubin 0.3 - 1.2 mg/dL   2.1   Alkaline Phos 38 - 126 U/L   48   AST 15 - 41 U/L   32   ALT 0 - 44 U/L   18      Assessment/Plan: - Continuing post-operative management per General Surgery. Advised that drainage likely secondary to edema noted at base of her abdomen in the surrounding skin near her incision. Otherwise incision itself appears to be healing well. Ostomy site appears to be doing well.  - Currently receiving TPN, also clear liquid diet.  - Continue PT/OT.  - Antibiotics: IV Unasyn.  WBC count still trending upward, however patient remains afebrile and no overt signs of infection. If upward trend continues, may need to consider additional/alternative antibiotic regimen.  - Mild anemia, but asymptomatic. Values  continue to oscillate daily, if consistently in downward trend, consider transfusion.  - Hospitalist currently also on board for general management.  - Will continue to follow as GYN.     A total of 35 minutes were spent during this encounter, including review of previous progress notes, recent imaging and labs, face-to-face with time with patient involving counseling and coordination of care, as well as documentation for current visit.   Rubie Maid, MD [facility]

## 2022-03-31 NOTE — Consult Note (Signed)
PHARMACY - TOTAL PARENTERAL NUTRITION CONSULT NOTE   Indication: Prolonged ileus  Patient Measurements: Height: '5\' 3"'$  (160 cm) Weight: 104.4 kg (230 lb 2.6 oz) IBW/kg (Calculated) : 52.4 TPN AdjBW (KG): 62.1 Body mass index is 40.77 kg/m.  Assessment:  Patient is [name] with medical history including recent laparoscopic adhesiolysis, left salpingectomy, drainage of peritoneal cyst, and chromotubation on 7/24 who presented to the ED 7/27 with severe pain in abdomen and vagina. Imaging concerning for post-operative abscess. Patient underwent CT drain placement into pelvic abscess on 7/28. Hospital course has been complicated by ileus. Pharmacy consulted to initiate TPN to provide nutrition given post-operative ileus.   Glucose / Insulin: BG 105-117 requiring 2 units insulin aspart SSI in the past 24 hours.  Insulin Glargine 10u HS added 8/3 Electrolytes: WNL Renal: Scr < 1 Hepatic: No transaminitis. Hyperbilirubinemia resolving Intake / Output; MIVF: JP drain left abdomen, NGT decompression, colostomy LUQ. Drain with minimal output. I&O: + 14L GI Imaging: 7/31 CT abdomen / pelvis: Percutaneous drainage catheter is seen in left-sided abdominal abscess. Mild proximal small bowel dilatation is noted which is slightly improved compared to prior exam, most consistent with ileus. Stable mild ascites is noted in the pelvis and right side of the abdomen. GI Surgeries / Procedures:  7/28: CT drain placement into pelvic abscess 8/2: Pending ExLap with general surgery  Central access: PICC Double Lumen placed 8/1 TPN start date: 8/1   Nutritional Goals: Goal TPN rate is 75 mL/hr (provides 108 g of protein and 2217 kcals per day)  RD Assessment: Estimated Needs Total Energy Estimated Needs: 2000-2300kcal/day Total Protein Estimated Needs: 100-115g/day Total Fluid Estimated Needs: 1.6-1.8L/day  Current Nutrition:  NPO  Plan:  --Continue TPN at 75 mL/hr (goal rate)  --Electrolytes in  TPN: Na 39mq/L, K 553m/L, Ca 98m61mL, Mg 98mE23m, and Phos 198mm61m. Cl:Ac 1:1 --Add standard MVI and trace elements to TPN, add thiamine 100 mg x 3 days (completed) --Continue Moderate q6h SSI and adjust as needed  -continue insulin glargine 10 units at bedtime --Monitor TPN labs on Mon/Thurs, daily until stable  KishaOswald HillockrmD, BCPS 03/31/2022,9:35 AM

## 2022-03-31 NOTE — Progress Notes (Signed)
POD # 4 Pain is the main issue AVSS Wbc slight increase   PE NAD, anxious Abd: soft, drain serous. No dehiscence. Ostomy improving slowly but no output  No output  A/P Doing ok Ileus w some improvement,  Keep on clears Added lyrica Keep TPN Mobilize Plan to do wound vac tomorrow If wbc increases may need repeat CT to look for abscess

## 2022-03-31 NOTE — Progress Notes (Signed)
Physical Therapy Treatment Patient Details Name: Lacey Harrison MRN: [id number] DOB: 1985-09-29 Today's Date: 03/31/2022   History of Present Illness Pt is a 36 y.o. female presenting to hospital post op day 3 from robotic assisted laparoscopic surgery (lysis of adhesions, left salpingectomy, drainage of large peritoneal cyst, chromotubation 7/24) c/o sudden onset of severe abdominal pain.  CT drain placement into pelvic abscess 7/28.  Pt developed severe sepsis/septic shock after drainage of abdominal pelvic abscess and transferred to ICU.  Pt also noted with acute hypoxic respiratory distress and AKI; developing ileus.  S/p 8/2 exploratory laparotomy, sigmoid colectomy with colostomy creation, and Hartman's procedure for perforated sigmoid colon.  PMH includes smoking, anxiety, lyme disease, migraines, PNA, chromopertubation, cystectomy L, infertility, previous surgery for L ovarian cystectomy L for dermoid cyst complicated by cystotomy and bladder repair in 2007, pelvic pain (underwent 02/16/18 lap and extensive abdominopelvic adhesiolysis).    PT Comments    Pt wanting to try but does c/o increased "squishy pain " L flank.  She is able to get to EOB with min a x 1, rails and increased time.  Steady in sitting but generally uncomfortable.  She does state she is having a hard time getting a deep breath today which she stated started yesterday.  Sats 94% at rest on room air and does drop to 88% on room air with movement but returns to baseline with rest at EOB.  She is a bit hot and diaphoretic.  While she is motivated to walk, she is limited by pain and SOB and opts to lay back down as she does not feel like she can walk at this time.  She has been OOB x 3 to [facility] with nursing today.  Discussed with RN after session.   Recommendations for follow up therapy are one component of a multi-disciplinary discharge planning process, led by the attending physician.  Recommendations may be updated based on patient  status, additional functional criteria and insurance authorization.  Follow Up Recommendations  Acute inpatient rehab (3hours/day)     Assistance Recommended at Discharge Frequent or constant Supervision/Assistance  Patient can return home with the following Assistance with cooking/housework;Assist for transportation;Help with stairs or ramp for entrance;A lot of help with walking and/or transfers;A lot of help with bathing/dressing/bathroom   Equipment Recommendations  Rolling walker (2 wheels);BSC/3in1;Wheelchair (measurements PT);Wheelchair cushion (measurements PT);Hospital bed    Recommendations for Other Services Rehab consult     Precautions / Restrictions Precautions Precautions: Fall Precaution Comments: JP drain L abdomen, NG tube, foley, picc line, long abdominal incision; L UQ colostomy Restrictions Weight Bearing Restrictions: No     Mobility  Bed Mobility Overal bed mobility: Needs Assistance Bed Mobility: Supine to Sit Rolling: Min assist   Supine to sit: Mod assist, +2 for physical assistance     General bed mobility comments: HOB elevated    Transfers                   General transfer comment: deferred but has been getting to [facility] x 3 today    Ambulation/Gait                   Stairs             Wheelchair Mobility    Modified Rankin (Stroke Patients Only)       Balance Overall balance assessment: Needs assistance Sitting-balance support: Feet supported, Bilateral upper extremity supported Sitting balance-Leahy Scale: Fair  Cognition Arousal/Alertness: Awake/alert Behavior During Therapy: WFL for tasks assessed/performed, Anxious Overall Cognitive Status: Within Functional Limits for tasks assessed                                          Exercises      General Comments        Pertinent Vitals/Pain Pain Assessment Pain Assessment:  Faces Faces Pain Scale: Hurts whole lot Pain Location: BLE, abdomen - L side "squishy pain" Pain Descriptors / Indicators: Discomfort, Grimacing, Guarding, Shooting Pain Intervention(s): Limited activity within patient's tolerance, Monitored during session, Repositioned    Home Living                          Prior Function            PT Goals (current goals can now be found in the care plan section) Progress towards PT goals: Progressing toward goals    Frequency    7X/week      PT Plan Current plan remains appropriate    Co-evaluation              AM-PAC PT "6 Clicks" Mobility   Outcome Measure  Help needed turning from your back to your side while in a flat bed without using bedrails?: A Lot Help needed moving from lying on your back to sitting on the side of a flat bed without using bedrails?: A Lot Help needed moving to and from a bed to a chair (including a wheelchair)?: A Little Help needed standing up from a chair using your arms (e.g., wheelchair or bedside chair)?: A Little Help needed to walk in hospital room?: A Lot Help needed climbing 3-5 steps with a railing? : A Lot 6 Click Score: 14    End of Session   Activity Tolerance: Patient tolerated treatment well;Patient limited by pain Patient left: in chair;with call bell/phone within reach;with family/visitor present Nurse Communication: Mobility status PT Visit Diagnosis: Other abnormalities of gait and mobility (R26.89);Muscle weakness (generalized) (M62.81);Pain Pain - part of body:  (abdomen)     Time: 2836-6294 PT Time Calculation (min) (ACUTE ONLY): 18 min  Charges:  $Therapeutic Activity: 8-22 mins                   Chesley Noon, PTA 03/31/22, 10:17 AM

## 2022-03-31 NOTE — Plan of Care (Signed)
  Problem: Clinical Measurements: Goal: Will remain free from infection Outcome: Not Progressing   Problem: Clinical Measurements: Goal: Diagnostic test results will improve Outcome: Not Progressing   Problem: Elimination: Goal: Will not experience complications related to bowel motility Outcome: Not Progressing   Problem: Pain Managment: Goal: General experience of comfort will improve Outcome: Not Progressing   Problem: Skin Integrity: Goal: Risk for impaired skin integrity will decrease Outcome: Not Progressing   Problem: Clinical Measurements: Goal: Signs and symptoms of infection will decrease Outcome: Not Progressing

## 2022-03-31 NOTE — Progress Notes (Signed)
PROGRESS NOTE    Lacey Harrison  SAY:[id number] DOB: 02-03-1986 DOA: 03/21/2022 PCP: Rubie Maid, MD   Brief Narrative:  This 36 yo Female presenting to [facility] ED from home on 03/21/22 with complaints of sudden onset severe abdominal pain. She recently on 03/18/22 had robotic adhesiolysis, left salpingectomy, drainage of 500 mL from large LEFT peritoneal cyst & chromotubation. She was discharged home on 2/35 without complication. She reported being on the floor with her cat and twisting her body around, with sudden acute abdominal pain. Patient complained of severe pain in lower abdomen radiating towards her vagina.  07/27: CT imaging abscess. Leukocytosis with left shift at 16.8. Initiated on IV Zosyn and Doxycycline. Admitted to OBGYN.  07/28: IR procedure 07/28 CT drain placement into pelvic abscess, left anterior approach, 12 Fr to bulb suction. Patient developed severe sepsis/septic shock after drainage of abdominal pelvic abscess.  She required transfer to stepdown unit, dyspnea worsened with desaturations noted, due to concerns for respiratory status PCCM consulted, receiving BS abx w/ zosyn, doxycycline & vancomycin, volume resuscitation w/ IVF, awaiting culture results   07/29: Remains in SDU w/ ileus and sepsis. IV antibiotics: Continue Zosyn, added fluconazole due to budding yeast noted on abscess fluid, Discontinue doxycycline/vancomycin x1, continue IVF hydration - bicarb infusion, DC other IV fluids due to anasarca. Developing ileus, query feculent appearance on abscess drain, repeat CT abdomen pelvis performed, new loculations on the right, anasarca, distended urinary bladder. No contrast extravasation to suggest bowel perforation 07/30: Continues to have abdominal pain, required NG last night due to recurrent vomiting.  Had 1200 mL of bilious material suctioned from stomach.  NG in place, abdomen less distended. IV antibiotics: Continue Zosyn, fluconazole, added Clindamycin d/t clostridium  prefringens noted on culture.  07/31: repeat CT The abscess is slightly decreased compared to prior exam. Continuing on Unasyn and Diflucan.  08/01: VSS, WBC trending down, BCx NGx4d. Ileus no better. Drain in place, minimal output. NG in place. PICC and TPN initiated. Spoke w/ GYN, plan for surgical consult and ID consult. Pt is in significant pain though lab/VS parameters and imaging show improvement, suspect pain more d/t ileus. 8/02: Patient continued to have persistently elevated white cell count with abdominal pain.  General surgery has scheduled exploratory laparotomy. 8/03: Exploratory laparotomy, sigmoid colectomy with colostomy creation, Hartmann procedure, lysis of additions.  Continue TPN and IV antibiotics. 08/04: Leukocytosis persist could be due to stress response.  Remains afebrile, continue NPO. and TPN, wound care consult, colostomy education. 08/05: NG tube removed, Foley removed.  Patient started on clear liquid diet, advance as tolerated.  Assessment & Plan:   Principal Problem:   Postoperative intra-abdominal abscess Active Problems:   Acute respiratory failure with hypoxia (HCC) in seting of MSKpain, atelectasis, severe sepsis    Severe sepsis (HCC) without septic shock, d/t postoperative abscess, s/p pelvic drain placement    Ileus, postoperative (HCC)   AKI (acute kidney injury) ([place]) in setting of sepsis, IV contrast, NAGMA - RESOLVED    Abdominal pain   Severe sepsis ([place]) without septic shock, d/t postoperative abscess, s/p pelvic drain placement : Cultures growing budding yeast, EColi and Clostridium --> Continue IV abx: Unasyn and Diflucan Abscess shows some improvement on CT on 07/30 --> repeat imaging showed worsening  Monitor CBC and VS for sepsis parameters --> no improvement. Patient underwent  Exploratory laparotomy, sigmoid colectomy with colostomy creation, Hartmann procedure, lysis of additions. POD # 3 WBC remains elevated.  As per ID if it continues  to trend up,  consider changing Unasyn to meropenem.. Wbc trended down, continue unasyn   Acute respiratory failure with hypoxia (HCC) in seting of atelectasis, severe sepsis  Continue Supplemental O2 prn goal >90%, weaned down to room air. Continue bronchodilators  Incentive spirometry    AKI (acute kidney injury) ([place]) in setting of sepsis, IV contrast, NAGMA - RESOLVED  Trend BMP I&O close monitor UOP   Ileus, postoperative (HCC) > Improving. NG tube removed 08/05. PICC and TPN placed on 03/26/22  Resumed on clear liquid diet General surgery following Postoperative day 4.       Exploratory laparotomy, sigmoid colectomy with colostomy creation, Hartmann procedure, lysis of additions.   Postoperative intra-abdominal abscess GYN following ID and general surgery consulted Leukocytosis worsening, she underwent exploratory laparotomy. Leukocytosis persisted could be stress reaction.  Continue to monitor. Leucocytosis improved, trending down now.   Abdominal pain Suspect related more to ileus since infection parameters are improving Discussed would like to avoid opiates if possible as this will not improve ileus Trial switching morphine to Dilaudid low-dose today Patient encouraged out of bed/walking   Hypokalemia: Replaced.  Resolved.  DVT prophylaxis: heparin Code Status: Full code. Family Communication: Family/ mother at bed side. Disposition Plan:  Patient underwent exploratory laparotomy, colectomy and colostomy creation,.  PT recommended acute inpatient rehab.  Consultants:  PCCM OB/GYN IR General surgery Infectious diseases  Procedures:  07/28 CT drain placement into pelvic abscess 07/30 NG tube placed  07/31 PICC line.  Antimicrobials:   Anti-infectives (From admission, onward)    Start     Dose/Rate Route Frequency Ordered Stop   03/25/22 1600  Ampicillin-Sulbactam (UNASYN) 3 g in sodium chloride 0.9 % 100 mL IVPB        3 g 200 mL/hr over 30 Minutes  Intravenous Every 6 hours 03/25/22 1134     03/24/22 2200  clindamycin (CLEOCIN) IVPB 900 mg  Status:  Discontinued        900 mg 100 mL/hr over 30 Minutes Intravenous Every 8 hours 03/24/22 1854 03/25/22 1134   03/23/22 1500  fluconazole (DIFLUCAN) IVPB 400 mg        400 mg 100 mL/hr over 120 Minutes Intravenous Every 24 hours 03/23/22 1358     03/22/22 1851  vancomycin variable dose per unstable renal function (pharmacist dosing)  Status:  Discontinued         Does not apply See admin instructions 03/22/22 1851 03/23/22 1051   03/22/22 1645  vancomycin (VANCOCIN) IVPB 1000 mg/200 mL premix  Status:  Discontinued        1,000 mg 200 mL/hr over 60 Minutes Intravenous  Once 03/22/22 1553 03/22/22 1558   03/22/22 1645  vancomycin (VANCOREADY) IVPB 2000 mg/400 mL        2,000 mg 200 mL/hr over 120 Minutes Intravenous  Once 03/22/22 1558 03/22/22 1814   03/22/22 0400  piperacillin-tazobactam (ZOSYN) IVPB 3.375 g  Status:  Discontinued        3.375 g 12.5 mL/hr over 240 Minutes Intravenous Every 8 hours 03/21/22 2047 03/25/22 1134   03/21/22 2200  doxycycline (VIBRAMYCIN) 100 mg in sodium chloride 0.9 % 250 mL IVPB  Status:  Discontinued        100 mg 125 mL/hr over 120 Minutes Intravenous Every 12 hours 03/21/22 2047 03/23/22 1358   03/21/22 1900  piperacillin-tazobactam (ZOSYN) IVPB 3.375 g        3.375 g 12.5 mL/hr over 240 Minutes Intravenous STAT 03/21/22 1853 03/21/22 2300  Subjective: Patient was seen and examined at bedside.  Overnight events noted.   Patient reports feeling much better today, NG tube was removed and she was placed on clear liquid diet. She underwent exploratory laparotomy with colostomy creation.  POD # 4. Patient remains on TPN.   Objective: Vitals:   03/30/22 2014 03/31/22 0333 03/31/22 0500 03/31/22 0802  BP: 106/61 112/76  (!) 97/58  Pulse: 99 (!) 101  99  Resp: '18 18  20  '$ Temp: 98.2 F (36.8 C) 98.1 F (36.7 C)  98.4 F (36.9 C)  TempSrc:     Oral  SpO2: 93% 95%  95%  Weight:   104.4 kg   Height:        Intake/Output Summary (Last 24 hours) at 03/31/2022 1034 Last data filed at 03/31/2022 0600 Gross per 24 hour  Intake 1012.6 ml  Output 1315 ml  Net -302.4 ml   Filed Weights   03/26/22 1800 03/27/22 0700 03/31/22 0500  Weight: 98 kg 101 kg 104.4 kg    Examination:  General exam: Appears comfortable, not in any acute distress, sitting on the bed watching television.   Respiratory system: CTA bilaterally, respiratory effort normal.  No wheezing, no crackles. Cardiovascular system: S1 & S2 heard, regular rate and rhythm, no murmur. Gastrointestinal system: Soft, mildly tender, distended, colostomy noted with no stools.  Bowel sounds: Sluggish. Central nervous system: Alert and oriented X 3. No focal neurological deficits. Extremities: No edema, no cyanosis, no clubbing. Skin: No rashes, lesions or ulcers Psychiatry: Judgement and insight appear normal. Mood & affect appropriate.     Data Reviewed: I have personally reviewed following labs and imaging studies  CBC: Recent Labs  Lab 03/26/22 0622 03/27/22 0747 03/28/22 0512 03/29/22 0422 03/30/22 0740 03/31/22 0611  WBC 10.8* 17.5* 22.0* 22.0* 17.0* 18.0*  NEUTROABS 7.5  --   --   --   --   --   HGB 11.1* 10.5* 10.5* 9.2* 10.1* 8.8*  HCT 33.0* 32.2* 32.8* 29.1* 32.3* 28.3*  MCV 86.4 87.3 89.4 91.2 91.0 91.3  PLT 165 161 183 197 239 448   Basic Metabolic Panel: Recent Labs  Lab 03/25/22 0353 03/26/22 0622 03/27/22 0747 03/27/22 2009 03/28/22 0512 03/29/22 0422 03/30/22 0740 03/31/22 0611  NA 135 137 140  --  141 143 141 139  K 3.0* 3.7 2.9* 3.5 3.7 3.3* 3.9 4.3  CL 97* 102 105  --  109 112* 106 106  CO2 '30 25 30  '$ --  '26 28 26 26  '$ GLUCOSE 81 72 126*  --  221* 148* 127* 105*  BUN '14 18 12  '$ --  '10 9 11 11  '$ CREATININE 0.82 0.53 0.51  --  0.50 0.41* 0.48 0.46  CALCIUM 7.6* 8.0* 7.8*  --  7.3* 7.5* 8.3* 7.9*  MG 2.4 2.3 1.9  --  2.0 1.7  --  2.0  PHOS  2.2* 4.8* 3.2  --  2.7 3.4  --   --    GFR: Estimated Creatinine Clearance: 112.3 mL/min (by C-G formula based on SCr of 0.46 mg/dL). Liver Function Tests: Recent Labs  Lab 03/25/22 0353 03/27/22 0747 03/28/22 0512 03/29/22 0422  AST 18 34 31 32  ALT '14 17 19 18  '$ ALKPHOS 49 56 44 48  BILITOT 3.4* 4.5* 2.8* 2.1*  PROT 5.1* 5.3* 4.4* 4.2*  ALBUMIN 2.1* 2.2* 2.1* 1.8*   No results for input(s): "LIPASE", "AMYLASE" in the last 168 hours.  No results for input(s): "AMMONIA" in  the last 168 hours. Coagulation Profile: No results for input(s): "INR", "PROTIME" in the last 168 hours.  Cardiac Enzymes: No results for input(s): "CKTOTAL", "CKMB", "CKMBINDEX", "TROPONINI" in the last 168 hours. BNP (last 3 results) No results for input(s): "PROBNP" in the last 8760 hours. HbA1C: No results for input(s): "HGBA1C" in the last 72 hours. CBG: Recent Labs  Lab 03/30/22 1149 03/30/22 1710 03/30/22 2026 03/30/22 2338 03/31/22 0508  GLUCAP 141* 114* 114* 118* 117*   Lipid Profile: No results for input(s): "CHOL", "HDL", "LDLCALC", "TRIG", "CHOLHDL", "LDLDIRECT" in the last 72 hours.  Thyroid Function Tests: No results for input(s): "TSH", "T4TOTAL", "FREET4", "T3FREE", "THYROIDAB" in the last 72 hours. Anemia Panel: No results for input(s): "VITAMINB12", "FOLATE", "FERRITIN", "TIBC", "IRON", "RETICCTPCT" in the last 72 hours. Sepsis Labs: No results for input(s): "PROCALCITON", "LATICACIDVEN" in the last 168 hours.   Recent Results (from the past 240 hour(s))  Culture, blood (x 2)     Status: None   Collection Time: 03/22/22 10:48 AM   Specimen: BLOOD  Result Value Ref Range Status   Specimen Description BLOOD BLOOD LEFT HAND  Final   Special Requests   Final    BOTTLES DRAWN AEROBIC AND ANAEROBIC Blood Culture adequate volume   Culture   Final    NO GROWTH 5 DAYS Performed at [facility], [address] [place], [place] [address]    Report Status 03/27/2022  FINAL  Final  Culture, blood (x 2)     Status: None   Collection Time: 03/22/22 11:00 AM   Specimen: BLOOD  Result Value Ref Range Status   Specimen Description BLOOD BLOOD RIGHT HAND  Final   Special Requests AEROBIC BOTTLE ONLY Blood Culture adequate volume  Final   Culture   Final    NO GROWTH 5 DAYS Performed at [facility], [address] [place], [place] [address]    Report Status 03/27/2022 FINAL  Final  Aerobic/Anaerobic Culture w Gram Stain (surgical/deep wound)     Status: None   Collection Time: 03/22/22 12:57 PM   Specimen: Wound; Abscess  Result Value Ref Range Status   Specimen Description   Final    WOUND Performed at [facility], [address] [place], [place] [address]    Special Requests   Final    ABD ABSCESS Performed at [facility], [address] [place], [place] [address]    Gram Stain   Final    ABUNDANT WBC PRESENT,BOTH PMN AND MONONUCLEAR ABUNDANT GRAM NEGATIVE RODS FEW GRAM POSITIVE RODS FEW GRAM POSITIVE COCCI IN PAIRS FEW BUDDING YEAST SEEN    Culture   Final    ABUNDANT ESCHERICHIA COLI MODERATE CLOSTRIDIUM PERFRINGENS Standardized susceptibility testing for this organism is not available. ABUNDANT BACTEROIDES VULGATUS BETA LACTAMASE NEGATIVE Performed at [facility], [place] [address], [place],  [address]    Report Status 03/25/2022 FINAL  Final   Organism ID, Bacteria ESCHERICHIA COLI  Final      Susceptibility   Escherichia coli - MIC*    AMPICILLIN 8 SENSITIVE Sensitive     CEFAZOLIN <=4 SENSITIVE Sensitive     CEFEPIME <=0.12 SENSITIVE Sensitive     CEFTAZIDIME <=1 SENSITIVE Sensitive     CEFTRIAXONE <=0.25 SENSITIVE Sensitive     CIPROFLOXACIN <=0.25 SENSITIVE Sensitive     GENTAMICIN <=1 SENSITIVE Sensitive     IMIPENEM <=0.25 SENSITIVE Sensitive     TRIMETH/SULFA <=20 SENSITIVE Sensitive     AMPICILLIN/SULBACTAM <=2 SENSITIVE  Sensitive     PIP/TAZO <=4 SENSITIVE Sensitive     *  ABUNDANT ESCHERICHIA COLI  MRSA Next Gen by PCR, Nasal     Status: None   Collection Time: 03/22/22 11:00 PM   Specimen: Nasal Mucosa; Nasal Swab  Result Value Ref Range Status   MRSA by PCR Next Gen NOT DETECTED NOT DETECTED Final    Comment: (NOTE) The GeneXpert MRSA Assay (FDA approved for NASAL specimens only), is one component of a comprehensive MRSA colonization surveillance program. It is not intended to diagnose MRSA infection nor to guide or monitor treatment for MRSA infections. Test performance is not FDA approved in patients less than 54 years old. Performed at [facility], [address], [place], [place] [address]   Urine Culture     Status: None   Collection Time: 03/24/22  6:02 PM   Specimen: Urine, Catheterized  Result Value Ref Range Status   Specimen Description   Final    URINE, CATHETERIZED Performed at [facility], [address], [place], [place] [address]    Special Requests   Final    Normal Performed at [facility], [address], [place], [place] [address]    Culture   Final    NO GROWTH Performed at [facility], [place] [address] [place], [place] [address]    Report Status 03/25/2022 FINAL  Final   Radiology Studies: No results found.  Scheduled Meds:  Chlorhexidine Gluconate Cloth  6 each Topical Daily   cyclobenzaprine  5 mg Oral TID   heparin injection (subcutaneous)  5,000 Units Subcutaneous Q8H   insulin aspart  0-15 Units Subcutaneous Q6H   insulin glargine-yfgn  10 Units Subcutaneous QHS   ketorolac  30 mg Intravenous Q6H   pantoprazole  40 mg Oral QHS   pregabalin  100 mg Oral TID   sodium chloride flush  10-40 mL Intracatheter Q12H   sodium chloride flush  5 mL Intracatheter Q8H   Continuous Infusions:  sodium chloride 10 mL/hr at 03/26/22 1939   ampicillin-sulbactam (UNASYN) IV 3 g (03/31/22 0904)   fluconazole (DIFLUCAN) IV Stopped (03/30/22 1648)   TPN ADULT (ION) 75 mL/hr at 03/31/22  0247   TPN ADULT (ION)       LOS: 10 days    Time spent: 35 mins    Hermena Swint, MD [facility]   If 7PM-7AM, please contact night-coverage

## 2022-04-01 ENCOUNTER — Inpatient Hospital Stay: Payer: No Typology Code available for payment source

## 2022-04-01 ENCOUNTER — Encounter: Payer: Self-pay | Admitting: Obstetrics and Gynecology

## 2022-04-01 DIAGNOSIS — K658 Other peritonitis: Secondary | ICD-10-CM

## 2022-04-01 DIAGNOSIS — N179 Acute kidney failure, unspecified: Secondary | ICD-10-CM | POA: Diagnosis not present

## 2022-04-01 DIAGNOSIS — T8143XA Infection following a procedure, organ and space surgical site, initial encounter: Secondary | ICD-10-CM | POA: Diagnosis not present

## 2022-04-01 DIAGNOSIS — N739 Female pelvic inflammatory disease, unspecified: Secondary | ICD-10-CM | POA: Diagnosis not present

## 2022-04-01 LAB — GLUCOSE, CAPILLARY
Glucose-Capillary: 111 mg/dL — ABNORMAL HIGH (ref 70–99)
Glucose-Capillary: 125 mg/dL — ABNORMAL HIGH (ref 70–99)
Glucose-Capillary: 135 mg/dL — ABNORMAL HIGH (ref 70–99)
Glucose-Capillary: 143 mg/dL — ABNORMAL HIGH (ref 70–99)

## 2022-04-01 LAB — COMPREHENSIVE METABOLIC PANEL
ALT: 30 U/L (ref 0–44)
AST: 33 U/L (ref 15–41)
Albumin: 2 g/dL — ABNORMAL LOW (ref 3.5–5.0)
Alkaline Phosphatase: 76 U/L (ref 38–126)
Anion gap: 9 (ref 5–15)
BUN: 10 mg/dL (ref 6–20)
CO2: 24 mmol/L (ref 22–32)
Calcium: 8.2 mg/dL — ABNORMAL LOW (ref 8.9–10.3)
Chloride: 107 mmol/L (ref 98–111)
Creatinine, Ser: 0.52 mg/dL (ref 0.44–1.00)
GFR, Estimated: >60 mL/min (ref >60)
Glucose, Bld: 106 mg/dL — ABNORMAL HIGH (ref 70–99)
Potassium: 4.4 mmol/L (ref 3.5–5.1)
Sodium: 140 mmol/L (ref 135–145)
Total Bilirubin: 2.3 mg/dL — ABNORMAL HIGH (ref 0.3–1.2)
Total Protein: 5.9 g/dL — ABNORMAL LOW (ref 6.5–8.1)

## 2022-04-01 LAB — PHOSPHORUS: Phosphorus: 5.2 mg/dL — ABNORMAL HIGH (ref 2.5–4.6)

## 2022-04-01 LAB — CBC
HCT: 28.5 % — ABNORMAL LOW (ref 36.0–46.0)
Hemoglobin: 8.9 g/dL — ABNORMAL LOW (ref 12.0–15.0)
MCH: 28.2 pg (ref 26.0–34.0)
MCHC: 31.2 g/dL (ref 30.0–36.0)
MCV: 90.2 fL (ref 80.0–100.0)
Platelets: 319 10*3/uL (ref 150–400)
RBC: 3.16 MIL/uL — ABNORMAL LOW (ref 3.87–5.11)
RDW: 14.3 % (ref 11.5–15.5)
WBC: 19.6 10*3/uL — ABNORMAL HIGH (ref 4.0–10.5)
nRBC: 0.4 % — ABNORMAL HIGH (ref 0.0–0.2)

## 2022-04-01 LAB — SURGICAL PATHOLOGY

## 2022-04-01 LAB — TRIGLYCERIDES: Triglycerides: 318 mg/dL — ABNORMAL HIGH (ref <150)

## 2022-04-01 LAB — MAGNESIUM: Magnesium: 2.1 mg/dL (ref 1.7–2.4)

## 2022-04-01 MED ORDER — HYDROMORPHONE HCL 1 MG/ML IJ SOLN
1.0000 mg | Freq: Once | INTRAMUSCULAR | Status: AC
  Administered 2022-04-01: 1 mg via INTRAVENOUS
  Filled 2022-04-01: qty 1

## 2022-04-01 MED ORDER — SODIUM CHLORIDE 0.9 % IV SOLN
1.0000 g | Freq: Three times a day (TID) | INTRAVENOUS | Status: DC
  Administered 2022-04-01 – 2022-04-08 (×21): 1 g via INTRAVENOUS
  Filled 2022-04-01: qty 20
  Filled 2022-04-01: qty 1
  Filled 2022-04-01 (×6): qty 20
  Filled 2022-04-01: qty 1
  Filled 2022-04-01 (×14): qty 20

## 2022-04-01 MED ORDER — TRACE MINERALS CU-MN-SE-ZN 300-55-60-3000 MCG/ML IV SOLN
INTRAVENOUS | Status: AC
  Filled 2022-04-01: qty 720

## 2022-04-01 MED ORDER — IOHEXOL 300 MG/ML  SOLN
100.0000 mL | Freq: Once | INTRAMUSCULAR | Status: AC | PRN
  Administered 2022-04-01: 100 mL via INTRAVENOUS

## 2022-04-01 MED ORDER — ENOXAPARIN SODIUM 60 MG/0.6ML IJ SOSY
0.5000 mg/kg | PREFILLED_SYRINGE | INTRAMUSCULAR | Status: DC
  Administered 2022-04-02 – 2022-04-06 (×5): 52.5 mg via SUBCUTANEOUS
  Filled 2022-04-01 (×5): qty 0.6

## 2022-04-01 MED ORDER — IOHEXOL 9 MG/ML PO SOLN
500.0000 mL | Freq: Once | ORAL | Status: AC | PRN
  Administered 2022-04-01 (×2): 500 mL via ORAL

## 2022-04-01 NOTE — Progress Notes (Signed)
PROGRESS NOTE    Lacey Harrison  DXI:[id number] DOB: [birth year] DOA: 03/21/2022 PCP: Rubie Maid, MD   Brief Narrative:  This 36 yo Female presenting to [facility] ED from home on 03/21/22 with complaints of sudden onset severe abdominal pain. She recently on 03/18/22 had robotic adhesiolysis, left salpingectomy, drainage of 500 mL from large LEFT peritoneal cyst & chromotubation. She was discharged home on 7/67 without complication. She reported being on the floor with her cat and twisting her body around, with sudden acute abdominal pain. Patient complained of severe pain in lower abdomen radiating towards her vagina.  07/27: CT imaging abscess. Leukocytosis with left shift at 16.8. Initiated on IV Zosyn and Doxycycline. Admitted to OBGYN.  07/28: IR procedure 07/28 CT drain placement into pelvic abscess, left anterior approach, 12 Fr to bulb suction. Patient developed severe sepsis/septic shock after drainage of abdominal pelvic abscess.  She required transfer to stepdown unit, dyspnea worsened with desaturations noted, due to concerns for respiratory status PCCM consulted, receiving BS abx w/ zosyn, doxycycline & vancomycin, volume resuscitation w/ IVF, awaiting culture results   07/29: Remains in SDU w/ ileus and sepsis. IV antibiotics: Continue Zosyn, added fluconazole due to budding yeast noted on abscess fluid, Discontinue doxycycline/vancomycin x1, continue IVF hydration - bicarb infusion, DC other IV fluids due to anasarca. Developing ileus, query feculent appearance on abscess drain, repeat CT abdomen pelvis performed, new loculations on the right, anasarca, distended urinary bladder. No contrast extravasation to suggest bowel perforation 07/30: Continues to have abdominal pain, required NG last night due to recurrent vomiting.  Had 1200 mL of bilious material suctioned from stomach.  NG in place, abdomen less distended. IV antibiotics: Continue Zosyn, fluconazole, added Clindamycin d/t clostridium  prefringens noted on culture.  07/31: repeat CT The abscess is slightly decreased compared to prior exam. Continuing on Unasyn and Diflucan.  08/01: VSS, WBC trending down, BCx NGx4d. Ileus no better. Drain in place, minimal output. NG in place. PICC and TPN initiated. Spoke w/ GYN, plan for surgical consult and ID consult. Pt is in significant pain though lab/VS parameters and imaging show improvement, suspect pain more d/t ileus. 8/02: Patient continued to have persistently elevated white cell count with abdominal pain.  General surgery has scheduled exploratory laparotomy. 8/03: Exploratory laparotomy, sigmoid colectomy with colostomy creation, Hartmann procedure, lysis of additions.  Continue TPN and IV antibiotics. 08/04: Leukocytosis persist could be due to stress response.  Remains afebrile, continue NPO. and TPN, wound care consult, colostomy education. 08/05: NG tube removed, Foley removed.  Patient started on clear liquid diet, advance as tolerated. 08/07: WBC remains elevated, continues to have pain.  General surgery ordered CT abdomen and pelvis to rule out intra-abdominal process.  Assessment & Plan:   Principal Problem:   Postoperative intra-abdominal abscess Active Problems:   Acute respiratory failure with hypoxia (HCC) in seting of MSKpain, atelectasis, severe sepsis    Severe sepsis (HCC) without septic shock, d/t postoperative abscess, s/p pelvic drain placement    Ileus, postoperative (HCC)   AKI (acute kidney injury) ([place]) in setting of sepsis, IV contrast, NAGMA - RESOLVED    Abdominal pain   Severe sepsis ([place]) without septic shock, d/t postoperative abscess, s/p pelvic drain placement : Cultures growing budding yeast, EColi and Clostridium --> Continue IV abx: Unasyn and Diflucan Abscess shows some improvement on CT on 07/30 --> repeat imaging showed worsening  Patient underwent  Exploratory laparotomy, sigmoid colectomy with colostomy creation, Hartmann procedure,  lysis of additions. POD #  4 WBC remains elevated.  As per ID if it continues to trend up,  consider changing Unasyn to meropenem.. Wbc trended down, continue unasyn   Acute respiratory failure with hypoxia (HCC) in seting of atelectasis, severe sepsis  Continue Supplemental O2 prn goal >90%, weaned down to room air. Continue bronchodilators  Incentive spirometry    AKI (acute kidney injury) ([place]) in setting of sepsis, IV contrast, NAGMA - RESOLVED  Trend BMP I&O close monitor UOP   Ileus, postoperative (HCC) > Improving. NG tube removed 08/05. PICC and TPN placed on 03/26/22  Resumed on clear liquid diet General surgery following Postoperative day 5.       Exploratory laparotomy, sigmoid colectomy with colostomy creation, Hartmann procedure, lysis of additions.   Postoperative intra-abdominal abscess GYN following ID and general surgery consulted Leukocytosis worsening, she underwent exploratory laparotomy. Leukocytosis persisted could be stress reaction.  Continue to monitor. Leucocytosis improved, trending down now.   Abdominal pain Suspect related more to ileus since infection parameters are improving Discussed would like to avoid opiates if possible as this will not improve ileus Trial switching morphine to Dilaudid low-dose today Patient encouraged out of bed/walking Obtain CT abdomen and pelvis to rule out intra-abdominal process.   Hypokalemia: Replaced.  Resolved.  DVT prophylaxis: heparin Code Status: Full code. Family Communication: Family/ mother at bed side. Disposition Plan:  Patient underwent exploratory laparotomy, colectomy and colostomy creation,.  PT recommended acute inpatient rehab.  Consultants:  PCCM OB/GYN IR General surgery Infectious diseases  Procedures:  07/28 CT drain placement into pelvic abscess 07/30 NG tube placed  07/31 PICC line.  Antimicrobials:   Anti-infectives (From admission, onward)    Start     Dose/Rate Route  Frequency Ordered Stop   03/25/22 1600  Ampicillin-Sulbactam (UNASYN) 3 g in sodium chloride 0.9 % 100 mL IVPB        3 g 200 mL/hr over 30 Minutes Intravenous Every 6 hours 03/25/22 1134     03/24/22 2200  clindamycin (CLEOCIN) IVPB 900 mg  Status:  Discontinued        900 mg 100 mL/hr over 30 Minutes Intravenous Every 8 hours 03/24/22 1854 03/25/22 1134   03/23/22 1500  fluconazole (DIFLUCAN) IVPB 400 mg        400 mg 100 mL/hr over 120 Minutes Intravenous Every 24 hours 03/23/22 1358     03/22/22 1851  vancomycin variable dose per unstable renal function (pharmacist dosing)  Status:  Discontinued         Does not apply See admin instructions 03/22/22 1851 03/23/22 1051   03/22/22 1645  vancomycin (VANCOCIN) IVPB 1000 mg/200 mL premix  Status:  Discontinued        1,000 mg 200 mL/hr over 60 Minutes Intravenous  Once 03/22/22 1553 03/22/22 1558   03/22/22 1645  vancomycin (VANCOREADY) IVPB 2000 mg/400 mL        2,000 mg 200 mL/hr over 120 Minutes Intravenous  Once 03/22/22 1558 03/22/22 1814   03/22/22 0400  piperacillin-tazobactam (ZOSYN) IVPB 3.375 g  Status:  Discontinued        3.375 g 12.5 mL/hr over 240 Minutes Intravenous Every 8 hours 03/21/22 2047 03/25/22 1134   03/21/22 2200  doxycycline (VIBRAMYCIN) 100 mg in sodium chloride 0.9 % 250 mL IVPB  Status:  Discontinued        100 mg 125 mL/hr over 120 Minutes Intravenous Every 12 hours 03/21/22 2047 03/23/22 1358   03/21/22 1900  piperacillin-tazobactam (ZOSYN) IVPB 3.375 g  3.375 g 12.5 mL/hr over 240 Minutes Intravenous STAT 03/21/22 1853 03/21/22 2300        Subjective: Patient was seen and examined at bedside.  Overnight events noted.   Patient reports feeling better than before,  NG tube was removed and she was placed on clear liquid diet. She underwent exploratory laparotomy with colostomy creation.  POD # 5. Patient remains on TPN.  Wound VAC was placed.  Objective: Vitals:   03/31/22 2326 04/01/22 0343  04/01/22 0429 04/01/22 0813  BP: 110/71 110/68  105/72  Pulse: (!) 107 (!) 108  64  Resp: '18 18  18  '$ Temp: 98.3 F (36.8 C) 98.6 F (37 C)  98.6 F (37 C)  TempSrc: Oral Oral    SpO2: 93% 97%  (!) 89%  Weight:   102.9 kg   Height:        Intake/Output Summary (Last 24 hours) at 04/01/2022 1128 Last data filed at 04/01/2022 0400 Gross per 24 hour  Intake 1127.15 ml  Output 1505 ml  Net -377.85 ml   Filed Weights   03/27/22 0700 03/31/22 0500 04/01/22 0429  Weight: 101 kg 104.4 kg 102.9 kg    Examination:  General exam: Appears comfortable, not in any acute distress, still reports pain Respiratory system: CTA bilaterally, respiratory effort normal.  No wheezing, no crackles. Cardiovascular system: S1 & S2 heard, regular rate and rhythm, no murmur. Gastrointestinal system: Soft, mildly tender, distended, colostomy noted with no stools.  Bowel sounds: Sluggish. Wound vac placed. Central nervous system: Alert and oriented X 3. No focal neurological deficits. Extremities: No edema, no cyanosis, no clubbing. Skin: No rashes, lesions or ulcers Psychiatry: Judgement and insight appear normal. Mood & affect appropriate.     Data Reviewed: I have personally reviewed following labs and imaging studies  CBC: Recent Labs  Lab 03/26/22 0622 03/27/22 0747 03/28/22 0512 03/29/22 0422 03/30/22 0740 03/31/22 0611 04/01/22 0435  WBC 10.8*   < > 22.0* 22.0* 17.0* 18.0* 19.6*  NEUTROABS 7.5  --   --   --   --   --   --   HGB 11.1*   < > 10.5* 9.2* 10.1* 8.8* 8.9*  HCT 33.0*   < > 32.8* 29.1* 32.3* 28.3* 28.5*  MCV 86.4   < > 89.4 91.2 91.0 91.3 90.2  PLT 165   < > 183 197 239 275 319   < > = values in this interval not displayed.   Basic Metabolic Panel: Recent Labs  Lab 03/26/22 0622 03/27/22 0747 03/27/22 2009 03/28/22 0512 03/29/22 0422 03/30/22 0740 03/31/22 0611 04/01/22 0435  NA 137 140  --  141 143 141 139 140  K 3.7 2.9*   < > 3.7 3.3* 3.9 4.3 4.4  CL 102 105  --   109 112* 106 106 107  CO2 25 30  --  '26 28 26 26 24  '$ GLUCOSE 72 126*  --  221* 148* 127* 105* 106*  BUN 18 12  --  '10 9 11 11 10  '$ CREATININE 0.53 0.51  --  0.50 0.41* 0.48 0.46 0.52  CALCIUM 8.0* 7.8*  --  7.3* 7.5* 8.3* 7.9* 8.2*  MG 2.3 1.9  --  2.0 1.7  --  2.0 2.1  PHOS 4.8* 3.2  --  2.7 3.4  --   --  5.2*   < > = values in this interval not displayed.   GFR: Estimated Creatinine Clearance: 111.4 mL/min (by C-G formula based on SCr  of 0.52 mg/dL). Liver Function Tests: Recent Labs  Lab 03/27/22 0747 03/28/22 0512 03/29/22 0422 04/01/22 0435  AST 34 31 32 33  ALT '17 19 18 30  '$ ALKPHOS 56 44 48 76  BILITOT 4.5* 2.8* 2.1* 2.3*  PROT 5.3* 4.4* 4.2* 5.9*  ALBUMIN 2.2* 2.1* 1.8* 2.0*   No results for input(s): "LIPASE", "AMYLASE" in the last 168 hours.  No results for input(s): "AMMONIA" in the last 168 hours. Coagulation Profile: No results for input(s): "INR", "PROTIME" in the last 168 hours.  Cardiac Enzymes: No results for input(s): "CKTOTAL", "CKMB", "CKMBINDEX", "TROPONINI" in the last 168 hours. BNP (last 3 results) No results for input(s): "PROBNP" in the last 8760 hours. HbA1C: No results for input(s): "HGBA1C" in the last 72 hours. CBG: Recent Labs  Lab 03/31/22 0508 03/31/22 1143 03/31/22 1651 03/31/22 2313 04/01/22 0552  GLUCAP 117* 120* 102* 120* 143*   Lipid Profile: Recent Labs    04/01/22 0435  TRIG 318*    Thyroid Function Tests: No results for input(s): "TSH", "T4TOTAL", "FREET4", "T3FREE", "THYROIDAB" in the last 72 hours. Anemia Panel: No results for input(s): "VITAMINB12", "FOLATE", "FERRITIN", "TIBC", "IRON", "RETICCTPCT" in the last 72 hours. Sepsis Labs: No results for input(s): "PROCALCITON", "LATICACIDVEN" in the last 168 hours.   Recent Results (from the past 240 hour(s))  Aerobic/Anaerobic Culture w Gram Stain (surgical/deep wound)     Status: None   Collection Time: 03/22/22 12:57 PM   Specimen: Wound; Abscess  Result Value  Ref Range Status   Specimen Description   Final    WOUND Performed at [facility], [address] [place], [place] [address]    Special Requests   Final    ABD ABSCESS Performed at [facility], [address] [place], [place] [address]    Gram Stain   Final    ABUNDANT WBC PRESENT,BOTH PMN AND MONONUCLEAR ABUNDANT GRAM NEGATIVE RODS FEW GRAM POSITIVE RODS FEW GRAM POSITIVE COCCI IN PAIRS FEW BUDDING YEAST SEEN    Culture   Final    ABUNDANT ESCHERICHIA COLI MODERATE CLOSTRIDIUM PERFRINGENS Standardized susceptibility testing for this organism is not available. ABUNDANT BACTEROIDES VULGATUS BETA LACTAMASE NEGATIVE Performed at [facility], [place] [address] [place], [place] [address]    Report Status 03/25/2022 FINAL  Final   Organism ID, Bacteria ESCHERICHIA COLI  Final      Susceptibility   Escherichia coli - MIC*    AMPICILLIN 8 SENSITIVE Sensitive     CEFAZOLIN <=4 SENSITIVE Sensitive     CEFEPIME <=0.12 SENSITIVE Sensitive     CEFTAZIDIME <=1 SENSITIVE Sensitive     CEFTRIAXONE <=0.25 SENSITIVE Sensitive     CIPROFLOXACIN <=0.25 SENSITIVE Sensitive     GENTAMICIN <=1 SENSITIVE Sensitive     IMIPENEM <=0.25 SENSITIVE Sensitive     TRIMETH/SULFA <=20 SENSITIVE Sensitive     AMPICILLIN/SULBACTAM <=2 SENSITIVE Sensitive     PIP/TAZO <=4 SENSITIVE Sensitive     * ABUNDANT ESCHERICHIA COLI  MRSA Next Gen by PCR, Nasal     Status: None   Collection Time: 03/22/22 11:00 PM   Specimen: Nasal Mucosa; Nasal Swab  Result Value Ref Range Status   MRSA by PCR Next Gen NOT DETECTED NOT DETECTED Final    Comment: (NOTE) The GeneXpert MRSA Assay (FDA approved for NASAL specimens only), is one component of a comprehensive MRSA colonization surveillance program. It is not intended to diagnose MRSA infection nor to guide or monitor treatment for MRSA infections. Test  performance is not FDA approved in patients less than 6 years old. Performed at  [facility], [address], [place], [place] [address]   Urine Culture     Status: None   Collection Time: 03/24/22  6:02 PM   Specimen: Urine, Catheterized  Result Value Ref Range Status   Specimen Description   Final    URINE, CATHETERIZED Performed at [facility], [address] [place], [place] [address]    Special Requests   Final    Normal Performed at [facility] [address] [place], [place] [address]    Culture   Final    NO GROWTH Performed at [facility], [place] [address], [place], [place] [address]    Report Status 03/25/2022 FINAL  Final   Radiology Studies: DG Chest 1 View  Result Date: 03/31/2022 CLINICAL DATA:  Fever EXAM: CHEST  1 VIEW COMPARISON:  03/22/2022 FINDINGS: Right PICC line in place with the tip in the right atrium. Low lung volumes, bibasilar atelectasis. Heart is normal size. No effusions or acute bony abnormality. IMPRESSION: Low lung volumes, bibasilar atelectasis. Electronically Signed   By: Rolm Baptise M.D.   On: 03/31/2022 19:04    Scheduled Meds:  Chlorhexidine Gluconate Cloth  6 each Topical Daily   cyclobenzaprine  7.5 mg Oral TID   enoxaparin (LOVENOX) injection  0.5 mg/kg Subcutaneous Q24H   insulin aspart  0-15 Units Subcutaneous Q6H   insulin glargine-yfgn  10 Units Subcutaneous QHS   ketorolac  30 mg Intravenous Q6H   pantoprazole  40 mg Oral QHS   pregabalin  100 mg Oral TID   sodium chloride flush  10-40 mL Intracatheter Q12H   sodium chloride flush  5 mL Intracatheter Q8H   Continuous Infusions:  sodium chloride 10 mL/hr at 03/31/22 2346   ampicillin-sulbactam (UNASYN) IV 3 g (04/01/22 0548)   fluconazole (DIFLUCAN) IV Stopped (03/31/22 1726)   TPN ADULT (ION) 75 mL/hr at 04/01/22 0309   TPN ADULT (ION)       LOS: 11 days    Time spent: 35 mins    Cordaryl Decelles, MD [facility]   If 7PM-7AM, please contact night-coverage

## 2022-04-01 NOTE — Progress Notes (Signed)
OT Cancellation Note  Patient Details Name: Lacey Harrison MRN: [id number] DOB: March 06, 1986   Cancelled Treatment:    Reason Eval/Treat Not Completed: Patient at procedure or test/ unavailable. Pt out of the room, per chart scheduled for CT. Will re-attempt OT tx at later date/time as pt is available and medically appropriate.   Ardeth Perfect., MPH, MS, OTR/L ascom [phone] 04/01/22, 2:42 PM

## 2022-04-01 NOTE — Progress Notes (Signed)
[place] Hospital Day(s): 11.   Post op day(s): 5 Days Post-Op.   Interval History:  Patient seen and examined No acute events or new complaints overnight.  T-max: 100.29F yesterday (08/06) Patient reports she is slowing getting better; pain control still the biggest barrier No fever, chills, nausea, emesis She continues to have leukocytosis; 19.6K Hgb to 8.9 Renal function normal; sCr - 0.52; UO - 1500 ccs Mild hyperphosphatemia to 5.2 Surgical drain with 5 ccs; serous Ostomy just with bowel sweat  Vital signs in last 24 hours: [min-max] current  Temp:  [98.3 F (36.8 C)-100.3 F (37.9 C)] 98.6 F (37 C) (08/07 0343) Pulse Rate:  [99-109] 108 (08/07 0343) Resp:  [18-20] 18 (08/07 0343) BP: (97-119)/(58-80) 110/68 (08/07 0343) SpO2:  [92 %-97 %] 97 % (08/07 0343) Weight:  [102.9 kg] 102.9 kg (08/07 0429)     Height: '5\' 3"'$  (160 cm) Weight: 102.9 kg BMI (Calculated): 40.2   Intake/Output last 2 shifts:  08/06 0701 - 08/07 0700 In: 1207.2 [P.O.:318; I.V.:808; IV Piggyback:81.2] Out: 6812 [Urine:1500; Drains:5]   Physical Exam:  Constitutional: alert, cooperative and no distress  Respiratory: breathing non-labored at rest  Cardiovascular: regular rate and sinus rhythm  Gastrointestinal: Soft, expected tenderness, non-distended, no rebound/guarding. Surgical drain in LLQ; output serous. 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 WOC at bedside to place wound vac  Labs:     Latest Ref Rng & Units 04/01/2022    4:35 AM 03/31/2022    6:11 AM 03/30/2022    7:40 AM  CBC  WBC 4.0 - 10.5 K/uL 19.6  18.0  17.0   Hemoglobin 12.0 - 15.0 g/dL 8.9  8.8  10.1   Hematocrit 36.0 - 46.0 % 28.5  28.3  32.3   Platelets 150 - 400 K/uL 319  275  239       Latest Ref Rng & Units 04/01/2022     4:35 AM 03/31/2022    6:11 AM 03/30/2022    7:40 AM  CMP  Glucose 70 - 99 mg/dL 106  105  127   BUN 6 - 20 mg/dL '10  11  11   '$ Creatinine 0.44 - 1.00 mg/dL 0.52  0.46  0.48   Sodium 135 - 145 mmol/L 140  139  141   Potassium 3.5 - 5.1 mmol/L 4.4  4.3  3.9   Chloride 98 - 111 mmol/L 107  106  106   CO2 22 - 32 mmol/L '24  26  26   '$ Calcium 8.9 - 10.3 mg/dL 8.2  7.9  8.3   Total Protein 6.5 - 8.1 g/dL 5.9     Total Bilirubin 0.3 - 1.2 mg/dL 2.3     Alkaline Phos 38 - 126 U/L 76     AST 15 - 41 U/L 33     ALT 0 - 44 U/L 30        Imaging studies: No new pertinent imaging studies   Assessment/Plan:  36 y.o. female awaiting ROBF, increasing leukocytosis, 5 Days Post-Op s/p exploratory laparotomy and Hartman's procedure for perforated sigmoid colon.   - Unfortunately still with increasing leukocytosis; Will plan to get CT Abdomen/Pelvis with IV/PO contrast to ensure no intra-abdominal process(es).   - Okay for CLD + TPN; monitor electrolytes  - Monitor colostomy function;  this will likely get dusky; WOC RN following  - Wound Care: Transition to NPWT today; WOC RN assisting  - Continue surgical drain; monitor and record output   - Monitor abdominal examination - Pain control scheduled + prn; antiemetics prn - Monitor leukocytosis; increasing - Mobilizing with therapies; recommendations are CIR - Further management per primary service  All of the above findings and recommendations were discussed with the patient, patient's family (mother-in-law at bedside), and the medical team, and all of patient's and family's questions were answered to their expressed satisfaction.  -- [name], PA-C  [facility] 04/01/2022, 7:24 AM M-F: 7am - 4pm

## 2022-04-01 NOTE — Progress Notes (Addendum)
Date of Admission:  03/21/2022    ID: Lacey Harrison is a 36 y.o. female  Principal Problem:   Postoperative intra-abdominal abscess Active Problems:   Acute respiratory failure with hypoxia (HCC) in seting of MSKpain, atelectasis, severe sepsis    Severe sepsis (HCC) without septic shock, d/t postoperative abscess, s/p pelvic drain placement    Ileus, postoperative (HCC)   AKI (acute kidney injury) ([place]) in setting of sepsis, IV contrast, NAGMA - RESOLVED    Abdominal pain    Subjective: Back from CT Has some abdominal pain No fever Some abdominal distension Medications:   Chlorhexidine Gluconate Cloth  6 each Topical Daily   cyclobenzaprine  7.5 mg Oral TID   enoxaparin (LOVENOX) injection  0.5 mg/kg Subcutaneous Q24H   insulin aspart  0-15 Units Subcutaneous Q6H   insulin glargine-yfgn  10 Units Subcutaneous QHS   pantoprazole  40 mg Oral QHS   pregabalin  100 mg Oral TID   sodium chloride flush  10-40 mL Intracatheter Q12H   sodium chloride flush  5 mL Intracatheter Q8H    Objective: Vital signs in last 24 hours: Temp:  [98.3 F (36.8 C)-99.2 F (37.3 C)] 99.2 F (37.3 C) (08/07 1942) Pulse Rate:  [64-120] 115 (08/07 2015) Resp:  [18-24] 20 (08/07 2015) BP: (105-123)/(61-81) 108/61 (08/07 1942) SpO2:  [89 %-100 %] 95 % (08/07 2015) Weight:  [102.9 kg] 102.9 kg (08/07 0429)  LDA Rt PICc   PHYSICAL EXAM:  General: awake Alert, cooperative, some  distress,  Head: Normocephalic, without obvious abnormality, atraumatic. Eyes: Conjunctivae clear, anicteric sclerae. Pupils are equal ENT Nares normal. No drainage or sinus tenderness. Lips, mucosa, and tongue normal. No Thrush Neck: , symmetrical, no adenopathy, thyroid: non tender no carotid bruit and no JVD. Back: did not examine Lungs: b/l air entry. Heart: Tachycardia Abdomen: lap site covered with wound vac Colostomy stoma- dark red Extremities: edema arms and legs Skin: No rashes or lesions. Or  bruising Lymph: Cervical, supraclavicular normal. Neurologic: Grossly non-focal  Lab Results Recent Labs    03/31/22 0611 04/01/22 0435  WBC 18.0* 19.6*  HGB 8.8* 8.9*  HCT 28.3* 28.5*  NA 139 140  K 4.3 4.4  CL 106 107  CO2 26 24  BUN 11 10  CREATININE 0.46 0.52   Liver Panel Recent Labs    04/01/22 0435  PROT 5.9*  ALBUMIN 2.0*  AST 33  ALT 30  ALKPHOS 76  BILITOT 2.3*    Microbiology:  ABUNDANT WBC PRESENT,BOTH PMN AND MONONUCLEAR  ABUNDANT GRAM NEGATIVE RODS  FEW GRAM POSITIVE RODS  FEW GRAM POSITIVE COCCI IN PAIRS  FEW BUDDING YEAST SEEN   Culture ABUNDANT ESCHERICHIA COLI  MODERATE CLOSTRIDIUM PERFRINGENS  Standardized susceptibility testing for this organism is not available.  ABUNDANT BACTEROIDES VULGATUS  BETA LACTAMASE NEGATIVE    Studies/Results: CT ABDOMEN PELVIS W CONTRAST  Result Date: 04/01/2022 CLINICAL DATA:  Five days status post exploratory laparotomy and Hartmann procedure for perforated sigmoid colon. Abdominal pain. Low-grade fever. Increasing leukocytosis. Awaiting return of bowel function. EXAM: CT ABDOMEN AND PELVIS WITH CONTRAST TECHNIQUE: Multidetector CT imaging of the abdomen and pelvis was performed using the standard protocol following bolus administration of intravenous contrast. RADIATION DOSE REDUCTION: This exam was performed according to the departmental dose-optimization program which includes automated exposure control, adjustment of the mA and/or kV according to patient size and/or use of iterative reconstruction technique. CONTRAST:  148m OMNIPAQUE IOHEXOL 300 MG/ML  SOLN COMPARISON:  KUB 03/29/2022 and  03/27/2022; barium enema study demonstrating sigmoid colon bowel injury/perforation 03/26/2022; CT abdomen pelvis 03/25/2022 FINDINGS: Lower chest: Moderate bandlike atelectasis with air bronchograms within the bilateral posteromedial lower lobes, improved from 03/25/2022 prior CT. No pleural effusion. Heart size is at the upper  limits of normal. Partial visualization of central venous catheter tip terminating at the superior vena cava/right atrial junction seen to be a right upper extremity PICC line on 03/31/2022 AP chest. Hepatobiliary: There is diffuse decreased density seen throughout the liver again suggesting fatty infiltration. There is layering contrast within the gallbladder likely from vicarious excretion. No intrahepatic or extrahepatic biliary ductal dilatation is seen. Pancreas: Unremarkable. No pancreatic ductal dilatation or surrounding inflammatory changes. Spleen: The spleen measures 13.7 cm in craniocaudal dimension, borderline enlarged and unchanged from prior. Adrenals/Urinary Tract: Normal bilateral adrenals. The kidneys enhance uniformly are symmetric in size without hydronephrosis. No focal urinary bladder wall thickening is seen. There is mild nondependent air within the urinary bladder lumen. Stomach/Bowel: Oral contrast is seen as distal as the rectum. There is a left upper quadrant distal descending colostomy. This is new from prior. There now appears to be a blind-ending rectal pouch consistent with interval Hartmann procedure. There is mild-to-moderate wall thickening of the postsurgical proximal rectum. Oral contrast is seen as distal as small bowel within the mid to upper abdomen (axial series 2, image 34) with more distal small bowel not filling with oral contrast likely just due to the timing of contrast administration as no transition point or definitive location of small bowel obstruction is seen. Findings are compatible with postoperative ileus with air-fluid level seen throughout the small bowel and multifocal areas of mild-to-moderate wall thickening. The appendix normally fills with contrast without dilatation or inflammatory changes seen. Vascular/Lymphatic: No abdominal aortic aneurysm. No mesenteric, retroperitoneal, or pelvic lymphadenopathy. Reproductive: The uterus is present.  No gross adnexal  abnormality. Other: A left pelvic approach drain is seen with tip curling within the right lower quadrant just inferior to the cecum. Interval removal of the prior left approach pigtail drainage catheter previously terminating within the left hemipelvis. There is mild interval increase in moderate fluid within the pelvis again with thin peripheral wall enhancement. Punctate focus of nondependent gas is seen within this fluid (axial series 2, image 73), decreased from prior. There is also a small focus of nondependent air within a portion of the pelvic fluid that extends into the right hemipelvis (axial image 70). There is mild interval increase in a thin rim enhancing fluid collection inferior to the liver, measuring up to approximately 4.8 by 13.9 by 16 cm (transverse by AP by craniocaudal) compared to approximately 3.3 x 10.0 x 16.7 cm on 03/25/2022. This fluid again extends down to the pelvis and is now contiguous with the ascites within the pelvis. There is punctate gas within the anterior superior aspect of this fluid collection (axial images 41 through 44). Overall there is mild-to-moderate ascites. There is a punctate focus of free air bordering the mid transverse dimension of the anterior inferior liver (axial series 2, image 29), decreased from prior. Punctate anterior midline abdominal pneumoperitoneum (axial series 2, image 54). Musculoskeletal: There is a new midline lower abdominal upper pelvic abdominal soft tissue defect with likely a vacuum assisted drain in this location. Moderate anasarca. No acute skeletal abnormality. IMPRESSION: Compared to 03/25/2022: 1. Interval likely partial sigmoid resection with new descending colon distal colostomy and new Hartmann pouch. 2. Interval removal of the prior left approach pigtail drainage catheter previously terminating  within the left hemipelvis and placement of a new left approach drain terminating within the right lower quadrant just inferior to the cecum.  Mild interval increase in fluid collections inferior to the liver extending into the right and left hemipelvis with small foci of air which may be secondary to infected nature of this fluid. 3. Punctate pneumoperitoneum, mildly decreased from prior. 4. Mild air within the nondependent urinary bladder. Recommend clinical correlation for recent instrumentation that would explain this. Please note gas forming infectious organism can also cause urinary bladder air, however no urinary bladder wall thickening is seen to suggest cystitis. Electronically Signed   By: Yvonne Kendall M.D.   On: 04/01/2022 16:33   DG Chest 1 View  Result Date: 03/31/2022 CLINICAL DATA:  Fever EXAM: CHEST  1 VIEW COMPARISON:  03/22/2022 FINDINGS: Right PICC line in place with the tip in the right atrium. Low lung volumes, bibasilar atelectasis. Heart is normal size. No effusions or acute bony abnormality. IMPRESSION: Low lung volumes, bibasilar atelectasis. Electronically Signed   By: Rolm Baptise M.D.   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  Multiple organisms of colon origin and culture.  E. coli, Bacteroides, Clostridium perfringens and yeast.  Patient underwent exploratory laparotomy and sigmoid colon resection with colostomy.Fecal peritonitis was present has been on Unasyn and fluconazole Because of worsening leucocytosis change unasyn to meropenem Await CT abdomen result   Fever has resolved Anemia  AKI has resolved  Septic shock has resolved  Hypoalbuminemia  Hyperbilirubinemia   Discussed the management with the patient , care team and family at bed side

## 2022-04-01 NOTE — Progress Notes (Signed)
Rounding on patient this morning, patient sleeping comfortably. Per patient's mother-in-law, patient had wound vac placed this morning, no issues. Preparing for CT scan due to persistent leukocytosis.   Rubie Maid, MD [facility]

## 2022-04-01 NOTE — Consult Note (Addendum)
[place] Nurse ostomy follow up: POD 5 Stoma type/location: LLQ Colostomy (Dr. Dahlia Byes). Seen today with Z. Delena Bali, PA-C.  Note:  Patient's husband is not feeling well today and had to leave to go home at 6am.  Stomal assessment/size: Oval, above skin level, lumen in center.  1 and 1/8 inch x 1 and 3/8 inch Peristomal assessment: intact, clear Treatment options for stomal/peristomal skin: skin barrier ring Output: small amount sanguinous. No stool, no flatus Ostomy pouching: 2pc. 2 and 1/4 inch pouching system with skin barrier ring. Lawson # [phone], 2 1/4" ostomy pouch, Lawson # 244, 2 1/4" skin barrier, Lawson # [phone] 2" skin barrier ring. Education provided: Limited today, patient is having NPWT applied for first time and is premedicated with IV Dilaudid. This Probation officer described steps of ostomy pouching to patient as she is unable to participate. Patient's Mother-in-law present for visit. Enrolled patient in [place] Start Discharge program: Yes, previously  Note:  PA-C Delena Bali assesses the ostomy digitally for patency.  WOC Nurse Consult Note: Reason for Consult:Placement of NPWT to midline wound Wound type:Surgical  Pressure Injury POA: N/A Measurement: 27cm x 6cm x 6cm with periwound defect at 9 o'clock (umbilicus). Crease at most distal margin of incision, subpannicular skin fold. Wound bed:red, moist Drainage (amount, consistency, odor): serous, moderate Periwound: intact Dressing procedure/placement/frequency: Dressing removed and Bedside RN and WTA performs CHG bath. Solution is allowed to air-dry. Two pieces of black foam used to obliterate dead space, pieces of skin barrier ring applied to distal margin of wound and to umbilical defect to enhance seal. Granufoam pieces (2) are  secured with drape and dressing attached to 117mHg continuous negative pressure. An immediate seal is achieved. Patient tolerated procedure well. Dressing is labeled.  Supplies in room:  1 Medium VAC  dressing kit, 2 skin barriers, 2 ostomy pouches, 3 skin barrier rings.  [facility] [facility] will follow, and will remain available to this patient, the nursing and medical teams.    Thank you for inviting [place] participate in this patient's Plan of Care.  LMaudie Flakes MSN, RN, CNS, GLake [place] CSerita Grammes [employer] FUnis[employer] Corporationphone:  [phone]

## 2022-04-01 NOTE — Plan of Care (Signed)
  Problem: Clinical Measurements: Goal: Respiratory complications will improve Outcome: Not Progressing   Problem: Activity: Goal: Risk for activity intolerance will decrease Outcome: Not Progressing   Problem: Nutrition: Goal: Adequate nutrition will be maintained Outcome: Not Progressing   Problem: Elimination: Goal: Will not experience complications related to bowel motility Outcome: Not Progressing   Problem: Pain Managment: Goal: General experience of comfort will improve Outcome: Not Progressing

## 2022-04-01 NOTE — Progress Notes (Signed)
PT Cancellation Note  Patient Details Name: Lacey Harrison MRN: [id number] DOB: 1986/01/20   Cancelled Treatment:    Reason Eval/Treat Not Completed: Patient at procedure or test/unavailable  Pt prepping for CT scan this pm.  Anticipate continue PT session tomorrow as appropriate.  Chesley Noon 04/01/2022, 12:54 PM

## 2022-04-01 NOTE — Consult Note (Signed)
PHARMACY - TOTAL PARENTERAL NUTRITION CONSULT NOTE   Indication: Prolonged ileus  Patient Measurements: Height: '5\' 3"'$  (160 cm) Weight: 102.9 kg (226 lb 13.7 oz) IBW/kg (Calculated) : 52.4 TPN AdjBW (KG): 62.1 Body mass index is 40.19 kg/m.  Assessment:  Patient is [name] with medical history including recent laparoscopic adhesiolysis, left salpingectomy, drainage of peritoneal cyst, and chromotubation on 7/24 who presented to the ED 7/27 with severe pain in abdomen and vagina. Imaging concerning for post-operative abscess. Patient underwent CT drain placement into pelvic abscess on 7/28. Hospital course has been complicated by ileus. Pharmacy consulted to initiate TPN to provide nutrition given post-operative ileus.   Glucose / Insulin: BG 105-143 requiring 2 units insulin aspart SSI in the past 24 hours.  Insulin Glargine 10u HS added 8/3 Electrolytes: WNL Renal: Scr < 1 Hepatic: LFTs remains WNL, Hyperbilirubinemia resolving Intake / Output; MIVF: JP drain left abdomen, NGT decompression, colostomy LUQ. Drain with minimal output. I&O: + 13.9L GI Imaging: 7/31 CT abdomen / pelvis: Percutaneous drainage catheter is seen in left-sided abdominal abscess. Mild proximal small bowel dilatation is noted which is slightly improved compared to prior exam, most consistent with ileus. Stable mild ascites is noted in the pelvis and right side of the abdomen. GI Surgeries / Procedures:  7/28: CT drain placement into pelvic abscess 8/2: Pending ExLap with general surgery  Central access: PICC Double Lumen placed 8/1 TPN start date: 8/1   Nutritional Goals: Goal TPN rate is 75 mL/hr (provides 108 g of protein and 2217 kcals per day)  RD Assessment: Estimated Needs Total Energy Estimated Needs: 2000-2300kcal/day Total Protein Estimated Needs: 100-115g/day Total Fluid Estimated Needs: 1.6-1.8L/day  Current Nutrition:  NPO  Plan:  Continue TPN at 75 mL/hr (goal rate)  Electrolytes in  TPN adjusted to: Na 469mq/L, K 543m/L, Ca 69m469mL, Mg 69mE68m, and Phos 69mmo4m. Cl:Ac 1:1 Add standard MVI and trace elements to TPN Thiamine 100 mg x 3 days (completed) Continue Moderate q6h SSI and adjust as needed  Continue insulin glargine 10 units at bedtime Monitor TPN labs on Mon/Thurs Additional phos level ordered for tomorrow 8/8 after phos adjusted on TPN  Celia Friedland Rodriguez-Guzman PharmD, BCPS 04/01/2022 7:46 AM

## 2022-04-02 ENCOUNTER — Inpatient Hospital Stay: Payer: No Typology Code available for payment source

## 2022-04-02 DIAGNOSIS — N739 Female pelvic inflammatory disease, unspecified: Secondary | ICD-10-CM | POA: Diagnosis not present

## 2022-04-02 DIAGNOSIS — T8143XA Infection following a procedure, organ and space surgical site, initial encounter: Secondary | ICD-10-CM | POA: Diagnosis not present

## 2022-04-02 LAB — GLUCOSE, CAPILLARY
Glucose-Capillary: 137 mg/dL — ABNORMAL HIGH (ref 70–99)
Glucose-Capillary: 146 mg/dL — ABNORMAL HIGH (ref 70–99)
Glucose-Capillary: 151 mg/dL — ABNORMAL HIGH (ref 70–99)
Glucose-Capillary: 97 mg/dL (ref 70–99)

## 2022-04-02 LAB — CBC
HCT: 27.1 % — ABNORMAL LOW (ref 36.0–46.0)
Hemoglobin: 8.5 g/dL — ABNORMAL LOW (ref 12.0–15.0)
MCH: 28.7 pg (ref 26.0–34.0)
MCHC: 31.4 g/dL (ref 30.0–36.0)
MCV: 91.6 fL (ref 80.0–100.0)
Platelets: 377 10*3/uL (ref 150–400)
RBC: 2.96 MIL/uL — ABNORMAL LOW (ref 3.87–5.11)
RDW: 14.5 % (ref 11.5–15.5)
WBC: 19 10*3/uL — ABNORMAL HIGH (ref 4.0–10.5)
nRBC: 0.2 % (ref 0.0–0.2)

## 2022-04-02 LAB — PROTIME-INR
INR: 1 (ref 0.8–1.2)
Prothrombin Time: 13.5 seconds (ref 11.4–15.2)

## 2022-04-02 LAB — PHOSPHORUS: Phosphorus: 4.5 mg/dL (ref 2.5–4.6)

## 2022-04-02 MED ORDER — FENTANYL CITRATE (PF) 100 MCG/2ML IJ SOLN
INTRAMUSCULAR | Status: AC
  Filled 2022-04-02: qty 2

## 2022-04-02 MED ORDER — MIDAZOLAM HCL 5 MG/5ML IJ SOLN
INTRAMUSCULAR | Status: AC | PRN
  Administered 2022-04-02 (×3): 1 mg via INTRAVENOUS

## 2022-04-02 MED ORDER — TRACE MINERALS CU-MN-SE-ZN 300-55-60-3000 MCG/ML IV SOLN
INTRAVENOUS | Status: AC
  Filled 2022-04-02: qty 720

## 2022-04-02 MED ORDER — MIDAZOLAM HCL 2 MG/2ML IJ SOLN
INTRAMUSCULAR | Status: AC
  Filled 2022-04-02: qty 4

## 2022-04-02 MED ORDER — FENTANYL CITRATE (PF) 100 MCG/2ML IJ SOLN
INTRAMUSCULAR | Status: AC | PRN
  Administered 2022-04-02 (×2): 50 ug via INTRAVENOUS

## 2022-04-02 NOTE — Progress Notes (Signed)
PT Cancellation Note  Patient Details Name: Lacey Harrison MRN: [id number] DOB: [birth year]   Cancelled Treatment:    Reason Eval/Treat Not Completed: Patient not medically ready.  Chart reviewed (plan for intra-abdominal fluid collection drain placement procedure today).  Recent BP's low.  Will hold PT at this time and re-attempt PT session at a later date/time as medically appropriate.  Leitha Bleak, PT 04/02/22, 11:45 AM

## 2022-04-02 NOTE — Progress Notes (Signed)
PROGRESS NOTE    Lacey Harrison  HBZ:[id number] DOB: [birth year] DOA: 03/21/2022  PCP: Rubie Maid, MD   Brief Narrative:  This 36 yo Female presenting to [facility] ED from home on 03/21/22 with complaints of sudden onset severe abdominal pain. She recently on 03/18/22 had robotic adhesiolysis, left salpingectomy, drainage of 500 mL from large LEFT peritoneal cyst & chromotubation. She was discharged home on 1/01 without complication. She reported being on the floor with her cat and twisting her body around, with sudden acute abdominal pain. Patient complained of severe pain in lower abdomen radiating towards her vagina.  07/27: CT imaging abscess. Leukocytosis with left shift at 16.8. Initiated on IV Zosyn and Doxycycline. Admitted to OBGYN.  07/28: IR procedure 07/28 CT drain placement into pelvic abscess, left anterior approach, 12 Fr to bulb suction. Patient developed severe sepsis/septic shock after drainage of abdominal pelvic abscess.  She required transfer to stepdown unit, dyspnea worsened with desaturations noted, due to concerns for respiratory status PCCM consulted, receiving BS abx w/ zosyn, doxycycline & vancomycin, volume resuscitation w/ IVF, awaiting culture results   07/29: Remains in SDU w/ ileus and sepsis. IV antibiotics: Continue Zosyn, added fluconazole due to budding yeast noted on abscess fluid, Discontinue doxycycline/vancomycin x1, continue IVF hydration - bicarb infusion, DC other IV fluids due to anasarca. Developing ileus, query feculent appearance on abscess drain, repeat CT abdomen pelvis performed, new loculations on the right, anasarca, distended urinary bladder. No contrast extravasation to suggest bowel perforation 07/30: Continues to have abdominal pain, required NG last night due to recurrent vomiting.  Had 1200 mL of bilious material suctioned from stomach.  NG in place, abdomen less distended. IV antibiotics: Continue Zosyn, fluconazole, added Clindamycin d/t  clostridium prefringens noted on culture.  07/31: repeat CT The abscess is slightly decreased compared to prior exam. Continuing on Unasyn and Diflucan.  08/01: VSS, WBC trending down, BCx NGx4d. Ileus no better. Drain in place, minimal output. NG in place. PICC and TPN initiated. Spoke w/ GYN, plan for surgical consult and ID consult. Pt is in significant pain though lab/VS parameters and imaging show improvement, suspect pain more d/t ileus. 8/02: Patient continued to have persistently elevated white cell count with abdominal pain.  General surgery has scheduled exploratory laparotomy. 8/03: She  underwent Exploratory laparotomy, sigmoid colectomy with colostomy creation, Hartmann procedure, lysis of additions.  Continue TPN and IV antibiotics. 08/04: Leukocytosis persist could be due to stress response.  Remains afebrile, continue NPO. and TPN, wound care consult, colostomy education. 08/05: NG tube removed, Foley removed.  Patient started on clear liquid diet, advance as tolerated. 08/07: WBC remains elevated, continues to have pain.  General surgery ordered CT abdomen and pelvis to rule out intra-abdominal process. 08/08: Repeat CT scan abdomen shows persistent pockets of fluid.  IR consulted patient underwent drain placement.  Assessment & Plan:   Principal Problem:   Postoperative intra-abdominal abscess Active Problems:   Acute respiratory failure with hypoxia (HCC) in seting of MSKpain, atelectasis, severe sepsis    Severe sepsis (HCC) without septic shock, d/t postoperative abscess, s/p pelvic drain placement    Ileus, postoperative (HCC)   AKI (acute kidney injury) ([place]) in setting of sepsis, IV contrast, NAGMA - RESOLVED    Abdominal pain   Severe sepsis ([place]) without septic shock, d/t postoperative abscess, s/p pelvic drain placement : Cultures growing budding yeast, EColi and Clostridium --> Continue IV abx: Unasyn and Diflucan Abscess shows some improvement on CT on 07/30 -->  repeat imaging showed worsening  Patient underwent  Exploratory laparotomy, sigmoid colectomy with colostomy creation, Hartmann procedure, lysis of additions. POD # 6 WBC remains elevated.  As per ID if it continues to trend up,  consider changing Unasyn to meropenem.Marland Kitchen Antibiotics changed with meropenem.  Patient continued to have pain. Repeat CT A/P: shows pockets of fluid, s/p drain placement by IR   Acute respiratory failure with hypoxia (HCC) in seting of atelectasis, severe sepsis  > Resolved. Continue Supplemental O2 prn goal >90%, weaned down to room air. Continue bronchodilators . Incentive spirometry    AKI (acute kidney injury) ([place]) in setting of sepsis, IV contrast, NAGMA  > Resolved. Trend BMP I&O close monitor UOP   Ileus, postoperative (HCC) > Improving. NG tube removed 08/05. PICC and TPN placed on 03/26/22  Resumed on clear liquid diet General surgery following Postoperative day 6.       Exploratory laparotomy, sigmoid colectomy with colostomy creation, Hartmann procedure, lysis of additions.   Postoperative intra-abdominal abscess GYN following ID and general surgery consulted Leukocytosis worsening, she underwent exploratory laparotomy. Leukocytosis persisted could be stress reaction.  Continue to monitor. Leucocytosis persisted. Antibiotics changed to meropenem. Repeat CT abdomen shows persistent pockets.  Patient underwent drain placement by IR.   Abdominal pain Suspect related more to ileus since infection parameters are improving Discussed would like to avoid opiates if possible as this will not improve ileus Trial switching morphine to Dilaudid low-dose today Patient encouraged out of bed/walking    Hypokalemia: Replaced.  Resolved.  DVT prophylaxis: heparin Code Status: Full code. Family Communication: Family/ mother at bed side. Disposition Plan:  Patient underwent exploratory laparotomy, colectomy and colostomy creation,.  PT recommended acute  inpatient rehab.  Consultants:  PCCM OB/GYN IR General surgery Infectious diseases  Procedures:  07/28 CT drain placement into pelvic abscess 07/30 NG tube placed  07/31 PICC line.  Antimicrobials:   Anti-infectives (From admission, onward)    Start     Dose/Rate Route Frequency Ordered Stop   04/01/22 1500  meropenem (MERREM) 1 g in sodium chloride 0.9 % 100 mL IVPB        1 g 200 mL/hr over 30 Minutes Intravenous Every 8 hours 04/01/22 1404     03/25/22 1600  Ampicillin-Sulbactam (UNASYN) 3 g in sodium chloride 0.9 % 100 mL IVPB  Status:  Discontinued        3 g 200 mL/hr over 30 Minutes Intravenous Every 6 hours 03/25/22 1134 04/01/22 1354   03/24/22 2200  clindamycin (CLEOCIN) IVPB 900 mg  Status:  Discontinued        900 mg 100 mL/hr over 30 Minutes Intravenous Every 8 hours 03/24/22 1854 03/25/22 1134   03/23/22 1500  fluconazole (DIFLUCAN) IVPB 400 mg        400 mg 100 mL/hr over 120 Minutes Intravenous Every 24 hours 03/23/22 1358     03/22/22 1851  vancomycin variable dose per unstable renal function (pharmacist dosing)  Status:  Discontinued         Does not apply See admin instructions 03/22/22 1851 03/23/22 1051   03/22/22 1645  vancomycin (VANCOCIN) IVPB 1000 mg/200 mL premix  Status:  Discontinued        1,000 mg 200 mL/hr over 60 Minutes Intravenous  Once 03/22/22 1553 03/22/22 1558   03/22/22 1645  vancomycin (VANCOREADY) IVPB 2000 mg/400 mL        2,000 mg 200 mL/hr over 120 Minutes Intravenous  Once 03/22/22 1558 03/22/22 1814  03/22/22 0400  piperacillin-tazobactam (ZOSYN) IVPB 3.375 g  Status:  Discontinued        3.375 g 12.5 mL/hr over 240 Minutes Intravenous Every 8 hours 03/21/22 2047 03/25/22 1134   03/21/22 2200  doxycycline (VIBRAMYCIN) 100 mg in sodium chloride 0.9 % 250 mL IVPB  Status:  Discontinued        100 mg 125 mL/hr over 120 Minutes Intravenous Every 12 hours 03/21/22 2047 03/23/22 1358   03/21/22 1900  piperacillin-tazobactam (ZOSYN)  IVPB 3.375 g        3.375 g 12.5 mL/hr over 240 Minutes Intravenous STAT 03/21/22 1853 03/21/22 2300        Subjective: Patient was seen and examined at bedside.  Overnight events noted.   Patient reports having abdominal pain.  NGT was removed.  She is tolerating clear liquid diet. She underwent exploratory laparotomy with colostomy creation. POD # 6. Patient remains on TPN.  Wound VAC was placed.  Patient underwent drain placement by IR today.  Objective: Vitals:   04/02/22 1150 04/02/22 1155 04/02/22 1200 04/02/22 1220  BP: (!) 100/52 93/63 (!) 104/52 104/62  Pulse: (!) 118 (!) 118 (!) 114 (!) 110  Resp: 20 (!) '24 20 20  '$ Temp:      TempSrc:      SpO2: 99% 98% 99% 98%  Weight:      Height:        Intake/Output Summary (Last 24 hours) at 04/02/2022 1239 Last data filed at 04/02/2022 1222 Gross per 24 hour  Intake 2907.38 ml  Output 131 ml  Net 2776.38 ml    Filed Weights   03/31/22 0500 04/01/22 0429 04/02/22 0500  Weight: 104.4 kg 102.9 kg 101.6 kg    Examination:  General exam: Appears comfortable, not in any distress.  Deconditioned. Respiratory system: CTA bilaterally, respiratory effort normal, no wheezing, no crackles. Cardiovascular system: S1 & S2 heard, regular rate and rhythm, no murmur. Gastrointestinal system: Soft, non distended, mildly tender, colostomy noted, bowel sound+,  wound VAC placed Central nervous system: Alert and oriented X 3. No focal neurological deficits. Extremities: No edema, no cyanosis, no clubbing. Skin: No rashes, lesions or ulcers Psychiatry: Judgement and insight appear normal. Mood & affect appropriate.     Data Reviewed: I have personally reviewed following labs and imaging studies  CBC: Recent Labs  Lab 03/29/22 0422 03/30/22 0740 03/31/22 0611 04/01/22 0435 04/02/22 0436  WBC 22.0* 17.0* 18.0* 19.6* 19.0*  HGB 9.2* 10.1* 8.8* 8.9* 8.5*  HCT 29.1* 32.3* 28.3* 28.5* 27.1*  MCV 91.2 91.0 91.3 90.2 91.6  PLT 197 239  275 319 962    Basic Metabolic Panel: Recent Labs  Lab 03/27/22 0747 03/27/22 2009 03/28/22 0512 03/29/22 0422 03/30/22 0740 03/31/22 0611 04/01/22 0435 04/02/22 0436  NA 140  --  141 143 141 139 140  --   K 2.9*   < > 3.7 3.3* 3.9 4.3 4.4  --   CL 105  --  109 112* 106 106 107  --   CO2 30  --  '26 28 26 26 24  '$ --   GLUCOSE 126*  --  221* 148* 127* 105* 106*  --   BUN 12  --  '10 9 11 11 10  '$ --   CREATININE 0.51  --  0.50 0.41* 0.48 0.46 0.52  --   CALCIUM 7.8*  --  7.3* 7.5* 8.3* 7.9* 8.2*  --   MG 1.9  --  2.0 1.7  --  2.0  2.1  --   PHOS 3.2  --  2.7 3.4  --   --  5.2* 4.5   < > = values in this interval not displayed.    GFR: Estimated Creatinine Clearance: 110.7 mL/min (by C-G formula based on SCr of 0.52 mg/dL). Liver Function Tests: Recent Labs  Lab 03/27/22 0747 03/28/22 0512 03/29/22 0422 04/01/22 0435  AST 34 31 32 33  ALT '17 19 18 30  '$ ALKPHOS 56 44 48 76  BILITOT 4.5* 2.8* 2.1* 2.3*  PROT 5.3* 4.4* 4.2* 5.9*  ALBUMIN 2.2* 2.1* 1.8* 2.0*    No results for input(s): "LIPASE", "AMYLASE" in the last 168 hours.  No results for input(s): "AMMONIA" in the last 168 hours. Coagulation Profile: Recent Labs  Lab 04/02/22 0436  INR 1.0    Cardiac Enzymes: No results for input(s): "CKTOTAL", "CKMB", "CKMBINDEX", "TROPONINI" in the last 168 hours. BNP (last 3 results) No results for input(s): "PROBNP" in the last 8760 hours. HbA1C: No results for input(s): "HGBA1C" in the last 72 hours. CBG: Recent Labs  Lab 04/01/22 0552 04/01/22 1159 04/01/22 1719 04/01/22 2345 04/02/22 0537  GLUCAP 143* 125* 111* 135* 151*    Lipid Profile: Recent Labs    04/01/22 0435  TRIG 318*     Thyroid Function Tests: No results for input(s): "TSH", "T4TOTAL", "FREET4", "T3FREE", "THYROIDAB" in the last 72 hours. Anemia Panel: No results for input(s): "VITAMINB12", "FOLATE", "FERRITIN", "TIBC", "IRON", "RETICCTPCT" in the last 72 hours. Sepsis Labs: No results  for input(s): "PROCALCITON", "LATICACIDVEN" in the last 168 hours.   Recent Results (from the past 240 hour(s))  Urine Culture     Status: None   Collection Time: 03/24/22  6:02 PM   Specimen: Urine, Catheterized  Result Value Ref Range Status   Specimen Description   Final    URINE, CATHETERIZED Performed at [facility], [address] [place], [place] [address]    Special Requests   Final    Normal Performed at [facility], [address], [place], [place] [address]    Culture   Final    NO GROWTH Performed at [facility], [place] [address], [place], [place] [address]    Report Status 03/25/2022 FINAL  Final   Radiology Studies: CT ABDOMEN PELVIS W CONTRAST  Result Date: 04/01/2022 CLINICAL DATA:  Five days status post exploratory laparotomy and Hartmann procedure for perforated sigmoid colon. Abdominal pain. Low-grade fever. Increasing leukocytosis. Awaiting return of bowel function. EXAM: CT ABDOMEN AND PELVIS WITH CONTRAST TECHNIQUE: Multidetector CT imaging of the abdomen and pelvis was performed using the standard protocol following bolus administration of intravenous contrast. RADIATION DOSE REDUCTION: This exam was performed according to the departmental dose-optimization program which includes automated exposure control, adjustment of the mA and/or kV according to patient size and/or use of iterative reconstruction technique. CONTRAST:  120m OMNIPAQUE IOHEXOL 300 MG/ML  SOLN COMPARISON:  KUB 03/29/2022 and 03/27/2022; barium enema study demonstrating sigmoid colon bowel injury/perforation 03/26/2022; CT abdomen pelvis 03/25/2022 FINDINGS: Lower chest: Moderate bandlike atelectasis with air bronchograms within the bilateral posteromedial lower lobes, improved from 03/25/2022 prior CT. No pleural effusion. Heart size is at the upper limits of normal. Partial visualization of central venous catheter tip terminating at the superior vena cava/right atrial  junction seen to be a right upper extremity PICC line on 03/31/2022 AP chest. Hepatobiliary: There is diffuse decreased density seen throughout the liver again suggesting fatty infiltration. There is layering contrast within the gallbladder likely  from vicarious excretion. No intrahepatic or extrahepatic biliary ductal dilatation is seen. Pancreas: Unremarkable. No pancreatic ductal dilatation or surrounding inflammatory changes. Spleen: The spleen measures 13.7 cm in craniocaudal dimension, borderline enlarged and unchanged from prior. Adrenals/Urinary Tract: Normal bilateral adrenals. The kidneys enhance uniformly are symmetric in size without hydronephrosis. No focal urinary bladder wall thickening is seen. There is mild nondependent air within the urinary bladder lumen. Stomach/Bowel: Oral contrast is seen as distal as the rectum. There is a left upper quadrant distal descending colostomy. This is new from prior. There now appears to be a blind-ending rectal pouch consistent with interval Hartmann procedure. There is mild-to-moderate wall thickening of the postsurgical proximal rectum. Oral contrast is seen as distal as small bowel within the mid to upper abdomen (axial series 2, image 34) with more distal small bowel not filling with oral contrast likely just due to the timing of contrast administration as no transition point or definitive location of small bowel obstruction is seen. Findings are compatible with postoperative ileus with air-fluid level seen throughout the small bowel and multifocal areas of mild-to-moderate wall thickening. The appendix normally fills with contrast without dilatation or inflammatory changes seen. Vascular/Lymphatic: No abdominal aortic aneurysm. No mesenteric, retroperitoneal, or pelvic lymphadenopathy. Reproductive: The uterus is present.  No gross adnexal abnormality. Other: A left pelvic approach drain is seen with tip curling within the right lower quadrant just inferior to  the cecum. Interval removal of the prior left approach pigtail drainage catheter previously terminating within the left hemipelvis. There is mild interval increase in moderate fluid within the pelvis again with thin peripheral wall enhancement. Punctate focus of nondependent gas is seen within this fluid (axial series 2, image 73), decreased from prior. There is also a small focus of nondependent air within a portion of the pelvic fluid that extends into the right hemipelvis (axial image 70). There is mild interval increase in a thin rim enhancing fluid collection inferior to the liver, measuring up to approximately 4.8 by 13.9 by 16 cm (transverse by AP by craniocaudal) compared to approximately 3.3 x 10.0 x 16.7 cm on 03/25/2022. This fluid again extends down to the pelvis and is now contiguous with the ascites within the pelvis. There is punctate gas within the anterior superior aspect of this fluid collection (axial images 41 through 44). Overall there is mild-to-moderate ascites. There is a punctate focus of free air bordering the mid transverse dimension of the anterior inferior liver (axial series 2, image 29), decreased from prior. Punctate anterior midline abdominal pneumoperitoneum (axial series 2, image 54). Musculoskeletal: There is a new midline lower abdominal upper pelvic abdominal soft tissue defect with likely a vacuum assisted drain in this location. Moderate anasarca. No acute skeletal abnormality. IMPRESSION: Compared to 03/25/2022: 1. Interval likely partial sigmoid resection with new descending colon distal colostomy and new Hartmann pouch. 2. Interval removal of the prior left approach pigtail drainage catheter previously terminating within the left hemipelvis and placement of a new left approach drain terminating within the right lower quadrant just inferior to the cecum. Mild interval increase in fluid collections inferior to the liver extending into the right and left hemipelvis with small  foci of air which may be secondary to infected nature of this fluid. 3. Punctate pneumoperitoneum, mildly decreased from prior. 4. Mild air within the nondependent urinary bladder. Recommend clinical correlation for recent instrumentation that would explain this. Please note gas forming infectious organism can also cause urinary bladder air, however no urinary bladder  wall thickening is seen to suggest cystitis. Electronically Signed   By: Yvonne Kendall M.D.   On: 04/01/2022 16:33   DG Chest 1 View  Result Date: 03/31/2022 CLINICAL DATA:  Fever EXAM: CHEST  1 VIEW COMPARISON:  03/22/2022 FINDINGS: Right PICC line in place with the tip in the right atrium. Low lung volumes, bibasilar atelectasis. Heart is normal size. No effusions or acute bony abnormality. IMPRESSION: Low lung volumes, bibasilar atelectasis. Electronically Signed   By: Rolm Baptise M.D.   On: 03/31/2022 19:04    Scheduled Meds:  Chlorhexidine Gluconate Cloth  6 each Topical Daily   cyclobenzaprine  7.5 mg Oral TID   enoxaparin (LOVENOX) injection  0.5 mg/kg Subcutaneous Q24H   fentaNYL       insulin aspart  0-15 Units Subcutaneous Q6H   insulin glargine-yfgn  10 Units Subcutaneous QHS   midazolam       pantoprazole  40 mg Oral QHS   pregabalin  100 mg Oral TID   sodium chloride flush  10-40 mL Intracatheter Q12H   sodium chloride flush  5 mL Intracatheter Q8H   Continuous Infusions:  sodium chloride 10 mL/hr at 04/02/22 0647   fluconazole (DIFLUCAN) IV Stopped (04/01/22 1522)   meropenem (MERREM) IV Stopped (04/02/22 0617)   TPN ADULT (ION) 75 mL/hr at 04/02/22 0647   TPN ADULT (ION)       LOS: 12 days    Time spent: 35 mins    Jessyka Austria, MD [facility]   If 7PM-7AM, please contact night-coverage

## 2022-04-02 NOTE — Progress Notes (Signed)
[place] Hospital Day(s): 12.   Post op day(s): 6 Days Post-Op.   Interval History:  Patient seen and examined No acute events or new complaints overnight.  Patient reports pain is doing better but still having abdominal pain; pain medications helping some No fever, chills, nausea, emesis She continues to have leukocytosis; 19.0K Hgb to 8.5 Surgical drain with 10 ccs; serous Ostomy just with bowel sweat; no gas NPO this morning for IR drain   Vital signs in last 24 hours: [min-max] current  Temp:  [98.2 F (36.8 C)-99.2 F (37.3 C)] 98.2 F (36.8 C) (08/08 0425) Pulse Rate:  [64-120] 114 (08/08 0425) Resp:  [18-24] 20 (08/08 0425) BP: (105-123)/(61-81) 105/61 (08/08 0425) SpO2:  [89 %-100 %] 93 % (08/08 0425) Weight:  [101.6 kg] 101.6 kg (08/08 0500)     Height: '5\' 3"'$  (160 cm) Weight: 101.6 kg BMI (Calculated): 39.69   Intake/Output last 2 shifts:  08/07 0701 - 08/08 0700 In: 2907.4 [I.V.:2282.2; IV Piggyback:625.2] Out: 1 [Urine:1]   Physical Exam:  Constitutional: alert, cooperative and no distress  Respiratory: breathing non-labored at rest  Cardiovascular: regular rate and sinus rhythm  Gastrointestinal: Soft, expected tenderness, non-distended, no rebound/guarding. Surgical drain in LLQ; output serous. 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; dusky, minimal bowel sweat present Integumentary: Midline wound healing via secondary intention, 27 x 6 x 6 cm; wound vac present; good seal   Labs:     Latest Ref Rng & Units 04/02/2022    4:36 AM 04/01/2022    4:35 AM 03/31/2022    6:11 AM  CBC  WBC 4.0 - 10.5 K/uL 19.0  19.6  18.0   Hemoglobin 12.0 - 15.0 g/dL 8.5  8.9  8.8   Hematocrit 36.0 - 46.0 % 27.1  28.5  28.3   Platelets 150 - 400 K/uL 377  319  275       Latest Ref Rng & Units 04/01/2022    4:35 AM 03/31/2022    6:11 AM 03/30/2022    7:40 AM  CMP  Glucose 70 - 99 mg/dL 106  105  127   BUN 6 - 20 mg/dL '10  11  11   '$ Creatinine 0.44  - 1.00 mg/dL 0.52  0.46  0.48   Sodium 135 - 145 mmol/L 140  139  141   Potassium 3.5 - 5.1 mmol/L 4.4  4.3  3.9   Chloride 98 - 111 mmol/L 107  106  106   CO2 22 - 32 mmol/L '24  26  26   '$ Calcium 8.9 - 10.3 mg/dL 8.2  7.9  8.3   Total Protein 6.5 - 8.1 g/dL 5.9     Total Bilirubin 0.3 - 1.2 mg/dL 2.3     Alkaline Phos 38 - 126 U/L 76     AST 15 - 41 U/L 33     ALT 0 - 44 U/L 30        Imaging studies:   CT Abdomen/Pelvis (04/01/2022) personally reviewed showing new RLQ fluid collection concerning for abscess, and radiologist report reviewed below:  IMPRESSION: Compared to 03/25/2022:   1. Interval likely partial sigmoid resection with new descending colon distal colostomy and new Hartmann pouch. 2. Interval removal of the prior left approach pigtail drainage catheter previously terminating within the left hemipelvis and placement of a new left approach drain terminating within the right lower quadrant just inferior to the cecum. Mild interval increase in fluid collections inferior to the liver extending  into the right and left hemipelvis with small foci of air which may be secondary to infected nature of this fluid. 3. Punctate pneumoperitoneum, mildly decreased from prior. 4. Mild air within the nondependent urinary bladder. Recommend clinical correlation for recent instrumentation that would explain this. Please note gas forming infectious organism can also cause urinary bladder air, however no urinary bladder wall thickening is seen to suggest cystitis.   Assessment/Plan:  36 y.o. female awaiting ROBF, increasing leukocytosis, 6 Days Post-Op s/p exploratory laparotomy and Hartman's procedure for perforated sigmoid colon.   - Late in the afternoon yesterday, I was able to discuss case and review images with IR who is in agreement to place percutaneous drain(s). Plan for this today. Their help is greatly appreciated   - NPO for IR procedure; okay to resume CLD after -  Continue TPN; monitor electrolytes  - Monitor colostomy function; this will likely get dusky; WOC RN following  - Wound Care: Wound Vac MWF; WOC RN assisting  - Continue surgical drain; monitor and record output   - Monitor abdominal examination - Pain control scheduled + prn; antiemetics prn - Monitor leukocytosis; increasing - Mobilizing with therapies; recommendations are CIR - Further management per primary service  All of the above findings and recommendations were discussed with the patient, patient's family (mother-in-law at bedside), and the medical team, and all of patient's and family's questions were answered to their expressed satisfaction.  -- Lacey Simon, PA-C [facility] 04/02/2022, 7:35 AM M-F: 7am - 4pm

## 2022-04-02 NOTE — H&P (Signed)
Chief Complaint: Patient was seen in consultation today for intra-abdominal abscess   at the request of [name], [place]  Referring Physician(s): [name], [place] Supervising Physician: Juliet Rude  Patient Status: [place] - In-pt  History of Present Illness: Lacey Harrison is a 36 y.o. female with recent robotic assisted laparoscopic adhesiolysis, left salpingectomy and drainage of peritoneal cyst 03/18/2022.  Patient presented to ED 03/21/2022 complaining of severe abdominal pain, found to have leukocytosis and tachycardia. CT abdomen resulted fluid collection consistent with developing abscess. Patient underwent abscess drain placement 03/25/2022 with IR and transferred to ICU d/t sepsis.  Patient continued to have pain and persistent leukocytosis. barium enema study performed 03/26/2022 that found contrast extravasation from sigmoid colon consistent with bowel injury/perforation.  Patient was taken back to surgery where she underwent sigmoid colectomy with Hartman's procedure.  Due to patient increasing leukocytosis CT abdomen was performed 04/01/2022 that found new fluid collections inferior to the liver extending into the right and left hemipelvis.  [name], PA referred patient to IR once again for intra-abdominal fluid collection drain placement.  Procedure was approved by Dr. Denna Haggard.  Past Medical History:  Diagnosis Date   Allergy    Anxiety    Chicken pox    Depression    Heart murmur    Lyme disease    patient reports a remote history of lyme and alludes that she has "chronic lyme"   Migraines    MIGRAINES   Pneumonia 07/2017    Past Surgical History:  Procedure Laterality Date   CHROMOPERTUBATION  02/16/2018   Procedure: CHROMOPERTUBATION;  Surgeon: Schermerhorn, Gwen Her, MD;  Location: [facility];  Service: Gynecology;;   CHROMOPERTUBATION Left 03/18/2022   Procedure: CHROMOPERTUBATION;  Surgeon: Rubie Maid, MD;  Location: [facility];  Service: Gynecology;   Laterality: Left;   COLECTOMY WITH COLOSTOMY CREATION/HARTMANN PROCEDURE N/A 03/27/2022   Procedure: SIGMOID COLECTOMY WITH COLOSTOMY CREATION/HARTMANN PROCEDURE; LYSIS OF ADHESIONS;  Surgeon: Jules Husbands, MD;  Location: [facility];  Service: General;  Laterality: N/A;   CYSTECTOMY Left    OVARY   DILATION AND CURETTAGE OF UTERUS  2008   LAPAROSCOPIC LYSIS OF ADHESIONS N/A 02/16/2018   Procedure: EXTENSIVE LAPAROSCOPIC LYSIS OF ADHESIONS;  Surgeon: Boykin Nearing, MD;  Location: [facility];  Service: Gynecology;  Laterality: N/A;   LAPAROSCOPY N/A 02/16/2018   Procedure: LAPAROSCOPY OPERATIVE;  Surgeon: Schermerhorn, Gwen Her, MD;  Location: [facility];  Service: Gynecology;  Laterality: N/A;   LAPAROTOMY N/A 03/27/2022   Procedure: EXPLORATORY LAPAROTOMY;  Surgeon: Jules Husbands, MD;  Location: [facility];  Service: General;  Laterality: N/A;   LYSIS OF ADHESION N/A 03/18/2022   Procedure: LYSIS OF ADHESION;  Surgeon: Rubie Maid, MD;  Location: [facility];  Service: Gynecology;  Laterality: N/A;   XI ROBOTIC ASSISTED SALPINGECTOMY Left 03/18/2022   Procedure: XI ROBOTIC ASSISTED SALPINGECTOMY;  Surgeon: Rubie Maid, MD;  Location: [facility];  Service: Gynecology;  Laterality: Left;    Allergies: Tylenol [acetaminophen]  Medications: Prior to Admission medications   Medication Sig Start Date End Date Taking? Authorizing Provider  ibuprofen (ADVIL) 800 MG tablet Take 1 tablet (800 mg total) by mouth every 8 (eight) hours as needed. 03/18/22  Yes Rubie Maid, MD  oxyCODONE (ROXICODONE) 5 MG immediate release tablet Take 1 tablet (5 mg total) by mouth every 8 (eight) hours as needed. 03/18/22 03/18/23 Yes Rubie Maid, MD  simethicone (GAS-X) 80 MG chewable tablet Chew 1 tablet (80 mg total) by  mouth 4 (four) times daily as needed for flatulence. 03/18/22 03/18/23 Yes Rubie Maid, MD  Vitamin D, Ergocalciferol, (DRISDOL) 1.25 MG (50000 UNIT) CAPS capsule Take 50,000 Units by mouth every 7  (seven) days.   Yes [provider]  albuterol (VENTOLIN HFA) 108 (90 Base) MCG/ACT inhaler Inhale 1-2 puffs into the lungs every 6 (six) hours as needed for wheezing or shortness of breath. 09/25/21   Hazel Sams, PA-C     Family History  Problem Relation Age of Onset   Mental illness Mother    Mental illness Father    Mental illness Maternal Grandmother    Mental illness Paternal Grandmother     Social History   Socioeconomic History   Marital status: Married    Spouse name: Thurmond Butts   Number of children: Not on file   Years of education: Not on file   Highest education level: Not on file  Occupational History   Not on file  Tobacco Use   Smoking status: Some Days    Packs/day: 0.75    Years: 14.00    Total pack years: 10.50    Types: Cigarettes    Last attempt to quit: 07/05/2016    Years since quitting: 5.7   Smokeless tobacco: Never  Vaping Use   Vaping Use: Former  Substance and Sexual Activity   Alcohol use: Not Currently    Alcohol/week: 11.0 standard drinks of alcohol    Types: 3 Glasses of wine, 2 Cans of beer, 3 Shots of liquor, 3 Standard drinks or equivalent per week    Comment: almost daily   Drug use: No   Sexual activity: Yes    Birth control/protection: None  Other Topics Concern   Not on file  Social History Narrative   Not on file   Social Determinants of Health   Financial Resource Strain: Not on file  Food Insecurity: Not on file  Transportation Needs: Not on file  Physical Activity: Not on file  Stress: Not on file  Social Connections: Not on file   Review of Systems: A 12 point ROS discussed and pertinent positives are indicated in the HPI above.  All other systems are negative.  Review of Systems  Constitutional:  Negative for chills and fever.  Respiratory:  Positive for shortness of breath.   Cardiovascular:  Negative for chest pain.  Gastrointestinal:  Positive for abdominal pain. Negative for nausea and vomiting.   Genitourinary:  Positive for flank pain.    Vital Signs: BP 101/64 (BP Location: Left Arm)   Pulse (!) 120   Temp 99.8 F (37.7 C) (Oral)   Resp 20   Ht '5\' 3"'$  (1.6 m)   Wt 223 lb 15.8 oz (101.6 kg)   LMP 02/25/2022 (Approximate)   SpO2 (!) 89%   BMI 39.68 kg/m     Physical Exam Vitals reviewed.  Constitutional:      General: She is not in acute distress.    Appearance: She is well-developed. She is ill-appearing.  HENT:     Head: Normocephalic and atraumatic.     Mouth/Throat:     Mouth: Mucous membranes are dry.     Pharynx: Oropharynx is clear.  Eyes:     Extraocular Movements: Extraocular movements intact.     Pupils: Pupils are equal, round, and reactive to light.  Cardiovascular:     Rate and Rhythm: Regular rhythm. Tachycardia present.  Pulmonary:     Effort: Pulmonary effort is normal. No respiratory distress.  Breath sounds: Normal breath sounds.  Abdominal:     Palpations: Abdomen is soft.     Comments: Colostomy present Midline surgical wound to wound vac Left surgical abdominal drain   Skin:    General: Skin is warm and dry.  Neurological:     Mental Status: She is alert and oriented to person, place, and time.  Psychiatric:        Mood and Affect: Mood normal.        Behavior: Behavior normal.        Thought Content: Thought content normal.        Judgment: Judgment normal.     Imaging: CT ABDOMEN PELVIS W CONTRAST  Result Date: 04/01/2022 CLINICAL DATA:  Five days status post exploratory laparotomy and Hartmann procedure for perforated sigmoid colon. Abdominal pain. Low-grade fever. Increasing leukocytosis. Awaiting return of bowel function. EXAM: CT ABDOMEN AND PELVIS WITH CONTRAST TECHNIQUE: Multidetector CT imaging of the abdomen and pelvis was performed using the standard protocol following bolus administration of intravenous contrast. RADIATION DOSE REDUCTION: This exam was performed according to the departmental dose-optimization program  which includes automated exposure control, adjustment of the mA and/or kV according to patient size and/or use of iterative reconstruction technique. CONTRAST:  119m OMNIPAQUE IOHEXOL 300 MG/ML  SOLN COMPARISON:  KUB 03/29/2022 and 03/27/2022; barium enema study demonstrating sigmoid colon bowel injury/perforation 03/26/2022; CT abdomen pelvis 03/25/2022 FINDINGS: Lower chest: Moderate bandlike atelectasis with air bronchograms within the bilateral posteromedial lower lobes, improved from 03/25/2022 prior CT. No pleural effusion. Heart size is at the upper limits of normal. Partial visualization of central venous catheter tip terminating at the superior vena cava/right atrial junction seen to be a right upper extremity PICC line on 03/31/2022 AP chest. Hepatobiliary: There is diffuse decreased density seen throughout the liver again suggesting fatty infiltration. There is layering contrast within the gallbladder likely from vicarious excretion. No intrahepatic or extrahepatic biliary ductal dilatation is seen. Pancreas: Unremarkable. No pancreatic ductal dilatation or surrounding inflammatory changes. Spleen: The spleen measures 13.7 cm in craniocaudal dimension, borderline enlarged and unchanged from prior. Adrenals/Urinary Tract: Normal bilateral adrenals. The kidneys enhance uniformly are symmetric in size without hydronephrosis. No focal urinary bladder wall thickening is seen. There is mild nondependent air within the urinary bladder lumen. Stomach/Bowel: Oral contrast is seen as distal as the rectum. There is a left upper quadrant distal descending colostomy. This is new from prior. There now appears to be a blind-ending rectal pouch consistent with interval Hartmann procedure. There is mild-to-moderate wall thickening of the postsurgical proximal rectum. Oral contrast is seen as distal as small bowel within the mid to upper abdomen (axial series 2, image 34) with more distal small bowel not filling with oral  contrast likely just due to the timing of contrast administration as no transition point or definitive location of small bowel obstruction is seen. Findings are compatible with postoperative ileus with air-fluid level seen throughout the small bowel and multifocal areas of mild-to-moderate wall thickening. The appendix normally fills with contrast without dilatation or inflammatory changes seen. Vascular/Lymphatic: No abdominal aortic aneurysm. No mesenteric, retroperitoneal, or pelvic lymphadenopathy. Reproductive: The uterus is present.  No gross adnexal abnormality. Other: A left pelvic approach drain is seen with tip curling within the right lower quadrant just inferior to the cecum. Interval removal of the prior left approach pigtail drainage catheter previously terminating within the left hemipelvis. There is mild interval increase in moderate fluid within the pelvis again with thin peripheral wall  enhancement. Punctate focus of nondependent gas is seen within this fluid (axial series 2, image 73), decreased from prior. There is also a small focus of nondependent air within a portion of the pelvic fluid that extends into the right hemipelvis (axial image 70). There is mild interval increase in a thin rim enhancing fluid collection inferior to the liver, measuring up to approximately 4.8 by 13.9 by 16 cm (transverse by AP by craniocaudal) compared to approximately 3.3 x 10.0 x 16.7 cm on 03/25/2022. This fluid again extends down to the pelvis and is now contiguous with the ascites within the pelvis. There is punctate gas within the anterior superior aspect of this fluid collection (axial images 41 through 44). Overall there is mild-to-moderate ascites. There is a punctate focus of free air bordering the mid transverse dimension of the anterior inferior liver (axial series 2, image 29), decreased from prior. Punctate anterior midline abdominal pneumoperitoneum (axial series 2, image 54). Musculoskeletal: There  is a new midline lower abdominal upper pelvic abdominal soft tissue defect with likely a vacuum assisted drain in this location. Moderate anasarca. No acute skeletal abnormality. IMPRESSION: Compared to 03/25/2022: 1. Interval likely partial sigmoid resection with new descending colon distal colostomy and new Hartmann pouch. 2. Interval removal of the prior left approach pigtail drainage catheter previously terminating within the left hemipelvis and placement of a new left approach drain terminating within the right lower quadrant just inferior to the cecum. Mild interval increase in fluid collections inferior to the liver extending into the right and left hemipelvis with small foci of air which may be secondary to infected nature of this fluid. 3. Punctate pneumoperitoneum, mildly decreased from prior. 4. Mild air within the nondependent urinary bladder. Recommend clinical correlation for recent instrumentation that would explain this. Please note gas forming infectious organism can also cause urinary bladder air, however no urinary bladder wall thickening is seen to suggest cystitis. Electronically Signed   By: Yvonne Kendall M.D.   On: 04/01/2022 16:33   DG Chest 1 View  Result Date: 03/31/2022 CLINICAL DATA:  Fever EXAM: CHEST  1 VIEW COMPARISON:  03/22/2022 FINDINGS: Right PICC line in place with the tip in the right atrium. Low lung volumes, bibasilar atelectasis. Heart is normal size. No effusions or acute bony abnormality. IMPRESSION: Low lung volumes, bibasilar atelectasis. Electronically Signed   By: Rolm Baptise M.D.   On: 03/31/2022 19:04   DG Abd Portable 1V  Result Date: 03/29/2022 CLINICAL DATA:  Ileus. EXAM: PORTABLE ABDOMEN - 1 VIEW COMPARISON:  Abdominal radiographs 03/27/2022. FINDINGS: Unchanged position of an enteric tube which coils in the stomach with tip in the expected location of the gastric cardia. A surgical drain projects over the lower abdomen/upper pelvis. Persistent contrast  within the colon and rectum. Previously demonstrated extraluminal contrast within the pelvis is no longer well appreciated. Persistent mild air distension of small bowel loops, although this has decreased as compared to the prior abdominal radiographs of 03/27/2022. IMPRESSION: Persistent although decreased mild air distention of small-bowel loops. Residual contrast within the colon and rectum. Findings are compatible with the provided history of ileus. Previously demonstrated extraluminal contrast within the pelvis is no longer well appreciated. Unchanged position of an enteric tube with tip terminating in the region of the gastric cardia. Electronically Signed   By: Kellie Simmering D.O.   On: 03/29/2022 08:40   DG Abd Portable 2V  Result Date: 03/27/2022 CLINICAL DATA:  Abdominal pain. History of postoperative pelvic abscess and sigmoid  colon perforation. EXAM: PORTABLE ABDOMEN - 2 VIEW COMPARISON:  Multiple previous CT scans. Contrast enema study from yesterday. FINDINGS: The NG tube remains in good position. There is contrast noted throughout the colon from the study yesterday. Large collection of contrast in the pelvis in the region of the abscess drainage catheter consistent with the sigmoid colon leak seen yesterday. IMPRESSION: Large collection of contrast in the pelvis surrounding the pelvic drainage catheter in the region of the sigmoid colon leak seen yesterday. Electronically Signed   By: Marijo Sanes M.D.   On: 03/27/2022 08:42   DG BE (COLON)W SINGLE CM (SOL OR THIN BA)  Result Date: 03/26/2022 CLINICAL DATA:  Recent pelvic/gyn surgery. Postoperative pelvic abscess. Status post percutaneous drain. Request for barium enema study to evaluate for possible recto-sigmoid perforation. EXAM: WATER-SOLUBLE CONTRAST ENEMA TECHNIQUE: Initial scout AP supine abdominal image obtained to insure adequate colon cleansing. Water-soluble contrast was introduced into the colon in a retrograde fashion and refluxed  from the rectum to the cecum. Spot images of the colon followed by overhead radiographs were obtained. FLUOROSCOPY: Radiation Exposure Index (as provided by the fluoroscopic device): 102.40 mGy Kerma CONTRAST:  450 CC OF OMNIPAQUE 300 COMPARISON:  CT abdomen and pelvis from 03/25/2022 FINDINGS: Contrast is seen promptly filling the rectum as well as the sigmoid and descending colon. Contrast ultimately reaches the cecum without difficulty. There appears to be extraluminal contrast extravasation at the region of the sigmoid colon. Exact location is difficult to visualize. However there does appear to be contrast pooling at the location of the percutaneous drain catheter. IMPRESSION: Positive findings of contrast extravasation from the sigmoid colon consistent with bowel injury/perforation. Contrast appears to be predominantly pooling within the abscess cavity where percutaneous drainage catheter is presently in position. Read by: Ascencion Dike PA-C These results will be called to the ordering clinician or representative by the Radiologist Assistant, and communication documented in the PACS or [place]. Electronically Signed   By: Kerby Moors M.D.   On: 03/26/2022 17:03   [place] EKG SITE RITE  Result Date: 03/26/2022 If Site Rite image not attached, placement could not be confirmed due to current cardiac rhythm.  CT ABDOMEN PELVIS W CONTRAST  Result Date: 03/25/2022 CLINICAL DATA:  Acute abdominal pain status post surgery. EXAM: CT ABDOMEN AND PELVIS WITH CONTRAST TECHNIQUE: Multidetector CT imaging of the abdomen and pelvis was performed using the standard protocol following bolus administration of intravenous contrast. RADIATION DOSE REDUCTION: This exam was performed according to the departmental dose-optimization program which includes automated exposure control, adjustment of the mA and/or kV according to patient size and/or use of iterative reconstruction technique. CONTRAST:  165m OMNIPAQUE  IOHEXOL 300 MG/ML  SOLN COMPARISON:  February 21, 2022. FINDINGS: Lower chest: Mild bilateral posterior basilar subsegmental atelectasis is noted. Hepatobiliary: Hepatic steatosis. No gallstones or biliary dilatation is noted. Pancreas: Unremarkable. No pancreatic ductal dilatation or surrounding inflammatory changes. Spleen: Normal in size without focal abnormality. Adrenals/Urinary Tract: Adrenal glands and kidneys appear normal. No hydronephrosis or renal obstruction is noted. Urinary bladder is decompressed secondary to Foley catheter. Stomach/Bowel: Nasogastric tube tip is seen with tip in proximal stomach. The stomach is otherwise unremarkable. The appendix appears normal. Mild proximal small bowel dilatation is noted which is improved slightly compared to prior exam. Vascular/Lymphatic: No significant vascular findings are present. No enlarged abdominal or pelvic lymph nodes. Reproductive: Status post left salpingectomy. Right adnexal region and uterus are unremarkable. Other: Continued presence of percutaneous drainage catheter seen in  left-sided pelvic abscess. The abscess currently measures 8.6 x 3.3 cm which is slightly decreased compared to prior exam. Mild ascites is noted in the pelvis and right side of the abdomen. No hernia is noted. Mild amount of pneumoperitoneum remains consistent with recent postoperative status. Musculoskeletal: No acute or significant osseous findings. IMPRESSION: Continued presence of percutaneous drainage catheter is seen in left-sided abdominal abscess. The abscess currently measures 8.6 x 3.3 cm which is slightly decreased compared to prior exam. Mild proximal small bowel dilatation is noted which is slightly improved compared to prior exam, most consistent with ileus. Mild amount of pneumoperitoneum is noted consistent with recent postoperative status. Stable mild ascites is noted in the pelvis and right side of the abdomen. Hepatic steatosis. Urinary bladder is decompressed  secondary to Foley catheter. Mild bilateral posterior basilar subsegmental atelectasis is noted. Electronically Signed   By: Marijo Conception M.D.   On: 03/25/2022 13:05   DG Abd 1 View  Result Date: 03/24/2022 CLINICAL DATA:  NG tube placement EXAM: ABDOMEN - 1 VIEW COMPARISON:  Chest radiograph 03/22/2022 FINDINGS: Enteric tube tip is coiled in the left upper quadrant consistent with location in the body of the stomach. Shallow inspiration with atelectasis in the lung bases. Gas-filled distended upper abdominal small bowel may indicate obstruction. IMPRESSION: Enteric tube tip projects over the body of the stomach. Atelectasis in the lung bases. Gaseous distention of small bowel. Electronically Signed   By: Lucienne Capers M.D.   On: 03/24/2022 01:33   CT ABDOMEN PELVIS W CONTRAST  Result Date: 03/23/2022 CLINICAL DATA:  History of robotic salpingectomy on 03/18/2022. Patient is now status post CT-guided placement of a drainage catheter into abdominopelvic abscess on 03/22/2022. Patient is having severe upper abdominal pain. EXAM: CT CHEST, ABDOMEN AND PELVIS WITHOUT CONTRAST TECHNIQUE: Multidetector CT imaging of the chest, abdomen and pelvis was performed following the standard protocol without IV contrast. RADIATION DOSE REDUCTION: This exam was performed according to the departmental dose-optimization program which includes automated exposure control, adjustment of the mA and/or kV according to patient size and/or use of iterative reconstruction technique. COMPARISON:  None Available. CT abdomen pelvis 03/21/2022. FINDINGS: CT CHEST FINDINGS Cardiovascular: No significant vascular findings. Normal heart size. No pericardial effusion. Mediastinum/Nodes: No enlarged mediastinal or axillary lymph nodes. Hilar lymph nodes are not well visualized on the unenhanced exam. Thyroid gland, trachea, and esophagus demonstrate no significant findings. Lungs/Pleura: Segmental atelectasis in the medial lower lobes.  Additional non segmental atelectasis in the right upper lobe. No pleural effusion or pneumothorax. Musculoskeletal: No chest wall mass or suspicious bone lesions identified. CT ABDOMEN PELVIS FINDINGS Hepatobiliary: Diffuse decreased attenuation of the liver. No focal liver abnormality is seen. Increased bile density within the gallbladder, likely reflecting vicarious contrast excretion. No gallstones, gallbladder wall thickening, or biliary dilatation. Pancreas: Unremarkable. No pancreatic ductal dilatation or surrounding inflammatory changes. Spleen: Normal in size without focal abnormality. Adrenals/Urinary Tract: Adrenal glands are unremarkable. Kidneys are normal, without renal calculi, focal lesion, or hydronephrosis. The urinary bladder is markedly distended but otherwise unremarkable. Stomach/Bowel: The stomach is moderately distended. Multiple dilated proximal and mid small bowel loops measuring up to 3.9 cm in diameter with air-fluid levels (series 7, image 31) the distal ileum is decompressed. There is a gradual tapering of the small bowel caliber without distinct transition point. Submucosal fat deposition throughout the ascending colon, suggestive of sequela of chronic inflammation. The colon is decompressed with mild bowel wall thickening of the sigmoid colon adjacent to the  abdominopelvic abscess. Vascular/Lymphatic: No significant vascular findings are present. No enlarged abdominal or pelvic lymph nodes. Reproductive: Uterus and right adnexa are unremarkable. Other: Interval placement of a pigtail drainage catheter within the loculated gas containing fluid collection centered at the left rectouterine pouch. The fluid collection is mildly decreased in size compared to 03/21/2022, measuring 12.8 cm craniocaudal dimension x 4.8 cm AP dimension on sagittal plane compared to 13 cm x 6.4 cm on previous study when measured in a similar fashion (series 6, image 78). Since 03/21/2022, there is increased  abdominopelvic ascites, particularly within the right mid and lower abdomen. A fluid collection in the right lower contains a few small foci of air but does not have a thin rim of enhancement to suggest a loculated fluid collection (series 7, image 60). Small amount of non dependent free air is again noted within the upper abdomen, consistent with recent postoperative status. Musculoskeletal: No acute or significant osseous findings. IMPRESSION: CT chest: 1. Segmental atelectasis in the lower lobes and additional subsegmental atelectasis in the right upper lobe. No pleural effusion. CT abdomen pelvis: Interval 1. Mild interval decreased size of the abdominopelvic abscess centered in the left rectouterine pouch status post pigtail drainage catheter placement. 2. New abdominopelvic ascites, particularly within the right abdomen, without discrete loculated drainable fluid collection. 3. New dilatation of multiple small bowel loops with air-fluid levels and moderate distention of the gas and fluid-filled stomach. No distinct transition point. Findings are favored to represent adynamic ileus secondary to inflammatory changes in the left pelvis. 4. Diffuse mild bowel wall thickening of the sigmoid colon secondary to adjacent inflammatory changes related to abdominopelvic abscess. 5. Small volume pneumoperitoneum, consistent with recent postoperative status. 6. Hepatic steatosis. Electronically Signed   By: Ileana Roup M.D.   On: 03/23/2022 15:47   CT CHEST WO CONTRAST  Result Date: 03/23/2022 CLINICAL DATA:  History of robotic salpingectomy on 03/18/2022. Patient is now status post CT-guided placement of a drainage catheter into abdominopelvic abscess on 03/22/2022. Patient is having severe upper abdominal pain. EXAM: CT CHEST, ABDOMEN AND PELVIS WITHOUT CONTRAST TECHNIQUE: Multidetector CT imaging of the chest, abdomen and pelvis was performed following the standard protocol without IV contrast. RADIATION DOSE  REDUCTION: This exam was performed according to the departmental dose-optimization program which includes automated exposure control, adjustment of the mA and/or kV according to patient size and/or use of iterative reconstruction technique. COMPARISON:  None Available. CT abdomen pelvis 03/21/2022. FINDINGS: CT CHEST FINDINGS Cardiovascular: No significant vascular findings. Normal heart size. No pericardial effusion. Mediastinum/Nodes: No enlarged mediastinal or axillary lymph nodes. Hilar lymph nodes are not well visualized on the unenhanced exam. Thyroid gland, trachea, and esophagus demonstrate no significant findings. Lungs/Pleura: Segmental atelectasis in the medial lower lobes. Additional non segmental atelectasis in the right upper lobe. No pleural effusion or pneumothorax. Musculoskeletal: No chest wall mass or suspicious bone lesions identified. CT ABDOMEN PELVIS FINDINGS Hepatobiliary: Diffuse decreased attenuation of the liver. No focal liver abnormality is seen. Increased bile density within the gallbladder, likely reflecting vicarious contrast excretion. No gallstones, gallbladder wall thickening, or biliary dilatation. Pancreas: Unremarkable. No pancreatic ductal dilatation or surrounding inflammatory changes. Spleen: Normal in size without focal abnormality. Adrenals/Urinary Tract: Adrenal glands are unremarkable. Kidneys are normal, without renal calculi, focal lesion, or hydronephrosis. The urinary bladder is markedly distended but otherwise unremarkable. Stomach/Bowel: The stomach is moderately distended. Multiple dilated proximal and mid small bowel loops measuring up to 3.9 cm in diameter with air-fluid levels (series 7,  image 31) the distal ileum is decompressed. There is a gradual tapering of the small bowel caliber without distinct transition point. Submucosal fat deposition throughout the ascending colon, suggestive of sequela of chronic inflammation. The colon is decompressed with mild  bowel wall thickening of the sigmoid colon adjacent to the abdominopelvic abscess. Vascular/Lymphatic: No significant vascular findings are present. No enlarged abdominal or pelvic lymph nodes. Reproductive: Uterus and right adnexa are unremarkable. Other: Interval placement of a pigtail drainage catheter within the loculated gas containing fluid collection centered at the left rectouterine pouch. The fluid collection is mildly decreased in size compared to 03/21/2022, measuring 12.8 cm craniocaudal dimension x 4.8 cm AP dimension on sagittal plane compared to 13 cm x 6.4 cm on previous study when measured in a similar fashion (series 6, image 78). Since 03/21/2022, there is increased abdominopelvic ascites, particularly within the right mid and lower abdomen. A fluid collection in the right lower contains a few small foci of air but does not have a thin rim of enhancement to suggest a loculated fluid collection (series 7, image 60). Small amount of non dependent free air is again noted within the upper abdomen, consistent with recent postoperative status. Musculoskeletal: No acute or significant osseous findings. IMPRESSION: CT chest: 1. Segmental atelectasis in the lower lobes and additional subsegmental atelectasis in the right upper lobe. No pleural effusion. CT abdomen pelvis: Interval 1. Mild interval decreased size of the abdominopelvic abscess centered in the left rectouterine pouch status post pigtail drainage catheter placement. 2. New abdominopelvic ascites, particularly within the right abdomen, without discrete loculated drainable fluid collection. 3. New dilatation of multiple small bowel loops with air-fluid levels and moderate distention of the gas and fluid-filled stomach. No distinct transition point. Findings are favored to represent adynamic ileus secondary to inflammatory changes in the left pelvis. 4. Diffuse mild bowel wall thickening of the sigmoid colon secondary to adjacent inflammatory  changes related to abdominopelvic abscess. 5. Small volume pneumoperitoneum, consistent with recent postoperative status. 6. Hepatic steatosis. Electronically Signed   By: Ileana Roup M.D.   On: 03/23/2022 15:47   DG Chest 2 View  Result Date: 03/22/2022 CLINICAL DATA:  Shortness of breath. EXAM: CHEST - 2 VIEW COMPARISON:  Two-view chest x-ray 07/26/2016.  CT chest 03/21/2022 FINDINGS: Low lung volumes exaggerate the heart size. Left lower lobe airspace opacities are again noted. No other significant airspace disease is present. IMPRESSION: Persistent left lower lobe airspace disease concerning for pneumonia. Electronically Signed   By: San Morelle M.D.   On: 03/22/2022 21:46   CT IMAGE GUIDED DRAINAGE PERCUT CATH  PERITONEAL RETROPERIT  Result Date: 03/22/2022 INDICATION: Abdominopelvic abscess EXAM: CT-guided placement of drainage catheter into abdominopelvic abscess TECHNIQUE: Multidetector CT imaging of the abdomen and pelvis was performed following the standard protocol without IV contrast. RADIATION DOSE REDUCTION: This exam was performed according to the departmental dose-optimization program which includes automated exposure control, adjustment of the mA and/or kV according to patient size and/or use of iterative reconstruction technique. MEDICATIONS: Per EMR ANESTHESIA/SEDATION: Local analgesia COMPLICATIONS: None immediate. PROCEDURE: Informed written consent was obtained from the patient after a thorough discussion of the procedural risks, benefits and alternatives. All questions were addressed. Maximal Sterile Barrier Technique was utilized including caps, mask, sterile gowns, sterile gloves, sterile drape, hand hygiene and skin antiseptic. A timeout was performed prior to the initiation of the procedure. The patient was placed supine on the exam table. Limited CT of the abdomen and pelvis was performed for  planning purposes. This again demonstrated an air in fluid collection in the  lower abdomen and pelvis. Skin entry site was marked with a planned anterolateral approach from the left lower quadrant. The overlying skin was prepped and draped in a standard sterile fashion. Local analgesia was obtained with 1% lidocaine. Using intermittent CT fluoroscopy, an 18 gauge trocar needle was advanced towards the identified abdominopelvic fluid collection. Access was confirmed with CT and return of purulent material. An 035 wire was then advanced through the access needle, over which the percutaneous tract was serially dilated to accommodate a 12 [place] multipurpose locking drainage catheter. Location was again confirmed with CT and return of additional purulent material. Catheter was secured to the skin using silk suture and a dressing. It was attached to bulb suction. The patient tolerated the procedure well without immediate complication. IMPRESSION: Successful CT-guided placement of a 12 French locking drainage catheter into the lower abdominopelvic abscess. Sample sent to the lab for microbiology analysis. Electronically Signed   By: Albin Felling M.D.   On: 03/22/2022 15:12   CT ABDOMEN PELVIS W CONTRAST  Result Date: 03/21/2022 CLINICAL DATA:  Abdominal pain, post-op Abdominal pain, acute, nonlocalized. Robotic salpingectomy 03/18/2022 EXAM: CT ABDOMEN AND PELVIS WITH CONTRAST TECHNIQUE: Multidetector CT imaging of the abdomen and pelvis was performed using the standard protocol following bolus administration of intravenous contrast. RADIATION DOSE REDUCTION: This exam was performed according to the departmental dose-optimization program which includes automated exposure control, adjustment of the mA and/or kV according to patient size and/or use of iterative reconstruction technique. CONTRAST:  159m OMNIPAQUE IOHEXOL 300 MG/ML  SOLN COMPARISON:  None Available. FINDINGS: Lower chest: Left base atelectasis.  No acute abnormality. Hepatobiliary: Liver is enlarged measuring up to 19 cm. The  hepatic parenchyma is diffusely hypodense compared to the splenic parenchyma consistent with fatty infiltration. No focal liver abnormality. No gallstones, gallbladder wall thickening, or pericholecystic fluid. No biliary dilatation. Pancreas: No focal lesion. Normal pancreatic contour. No surrounding inflammatory changes. No main pancreatic ductal dilatation. Spleen: Normal in size without focal abnormality. Adrenals/Urinary Tract: No adrenal nodule bilaterally. Bilateral kidneys enhance symmetrically. No hydronephrosis. No hydroureter. The urinary bladder is unremarkable. On delayed imaging, there is no urothelial wall thickening and there are no filling defects in the opacified portions of the bilateral collecting systems or ureters as well as urinary bladder. Stomach/Bowel: Stomach is within normal limits. No evidence of bowel wall thickening or dilatation. Appendix appears normal. Vascular/Lymphatic: No abdominal aorta or iliac aneurysm. No abdominal, pelvic, or inguinal lymphadenopathy. Reproductive: Uterus is unremarkable no adnexal mass identified. Other: No intraperitoneal free fluid. Small volume free intraperitoneal gas. There is a gas and fluid collection with peripheral enhancement measuring approximately 8 x 9 x 7.5 cm centered within the left rectouterine pouch. Musculoskeletal: No abdominal wall hernia or abnormality. No suspicious lytic or blastic osseous lesions. No acute displaced fracture. IMPRESSION: 1. An 8 x 9 x 7.5cm gas and fluid collection centered within the left rectouterine pouch likely represents a developing abscess. No findings to suggest bowel injury; however, this is not fully excluded. If a CT is repeated in the near future, consider use of IV and PO contrast. 2. Small volume pneumoperitoneum likely postsurgical in etiology. 3. Hepatomegaly and hepatic steatosis. Electronically Signed   By: MIven FinnM.D.   On: 03/21/2022 18:43    Labs:  CBC: Recent Labs     03/30/22 0740 03/31/22 0611 04/01/22 0435 04/02/22 0436  WBC 17.0* 18.0* 19.6* 19.0*  HGB  10.1* 8.8* 8.9* 8.5*  HCT 32.3* 28.3* 28.5* 27.1*  PLT 239 275 319 377    COAGS: Recent Labs    03/22/22 1048 04/02/22 0436  INR 1.3* 1.0  APTT 34  --     BMP: Recent Labs    03/29/22 0422 03/30/22 0740 03/31/22 0611 04/01/22 0435  NA 143 141 139 140  K 3.3* 3.9 4.3 4.4  CL 112* 106 106 107  CO2 '28 26 26 24  '$ GLUCOSE 148* 127* 105* 106*  BUN '9 11 11 10  '$ CALCIUM 7.5* 8.3* 7.9* 8.2*  CREATININE 0.41* 0.48 0.46 0.52  GFRNONAA >60 >60 >60 >60    LIVER FUNCTION TESTS: Recent Labs    03/27/22 0747 03/28/22 0512 03/29/22 0422 04/01/22 0435  BILITOT 4.5* 2.8* 2.1* 2.3*  AST 34 31 32 33  ALT '17 19 18 30  '$ ALKPHOS 56 44 48 76  PROT 5.3* 4.4* 4.2* 5.9*  ALBUMIN 2.2* 2.1* 1.8* 2.0*    TUMOR MARKERS: No results for input(s): "AFPTM", "CEA", "CA199", "CHROMGRNA" in the last 8760 hours.  Assessment and Plan: Pt with recent robotic assisted laparoscopic adhesiolysis, left salpingectomy and drainage of peritoneal cyst 03/18/2022.  Patient presented to ED 03/21/2022 complaining of severe abdominal pain, found to have leukocytosis and tachycardia. CT abdomen resulted fluid collection consistent with developing abscess. Patient underwent abscess drain placement 03/25/2022 with IR and transferred to ICU d/t sepsis.  Patient continued to have pain and persistent leukocytosis. barium enema study performed 03/26/2022 that found contrast extravasation from sigmoid colon consistent with bowel injury/perforation.  Patient was taken back to surgery where she underwent sigmoid colectomy with Hartman's procedure.  Due to patient increasing leukocytosis CT abdomen was performed 04/01/2022 that found new fluid collections inferior to the liver extending into the right and left hemipelvis.  [name], PA referred patient to IR once again for intra-abdominal fluid collection drain placement.  Procedure was  approved by Dr. Denna Haggard.  Pt resting in bed with family at bedside.  She is A&O, calm and pleasant.  She is in no distress.  Pt states she has been NPO.   WBC 19 (19.6) Tmax 99.8 INR 1.3 Hgb 8.5   Risks and benefits discussed with the patient including bleeding, infection, damage to adjacent structures, bowel perforation/fistula connection, and sepsis.  All of the patient's questions were answered, patient is agreeable to proceed. Consent signed and in chart.   Thank you for this interesting consult.  I greatly enjoyed meeting CACIE GASKINS and look forward to participating in their care.  A copy of this report was sent to the requesting provider on this date.  Electronically Signed: Tyson Alias, NP 04/02/2022, 8:05 AM   I spent a total of 20 minutes in face to face in clinical consultation, greater than 50% of which was counseling/coordinating care for abdominal fluid collections.

## 2022-04-02 NOTE — Progress Notes (Signed)
OT Cancellation Note  Patient Details Name: Lacey Harrison MRN: [id number] DOB: [birth year]   Cancelled Treatment:    Reason Eval/Treat Not Completed: Medical issues which prohibited therapy. Chart reviewed. Recent BPs low. Will hold OT tx this morning and re-attempt this afternoon as appropriate.   Ardeth Perfect., MPH, MS, OTR/L ascom [phone] 04/02/22, 11:22 AM

## 2022-04-02 NOTE — Progress Notes (Signed)
Patient clinically stable post bilateral 10 FR drains placed for abscess' per DR El-Abd, tolerated well. Vitals stable pre and post procedures, received Versed 3 mg along with Fentanyl 120mg IV for procedures. Awake/alert and oriented post procedures. Bedside report given to [place]/recovery.

## 2022-04-02 NOTE — TOC Progression Note (Signed)
Transition of Care [facility]) - Progression Note    Patient Details  Name: Lacey Harrison MRN: [id number] Date of Birth: [birth year]  [facility] CM/SW Contact  Beverly Sessions, RN Phone Number: 04/02/2022, 2:16 PM  Clinical Narrative:      S/p drain placement today Secure Chat message sent to [facility] to follow up on status  Expected Discharge Plan:  (TBD) Barriers to Discharge: Continued Medical Work up  Expected Discharge Plan and Services Expected Discharge Plan:  (TBD)       Living arrangements for the past 2 months: Single Family Home                                       Social Determinants of Health (SDOH) Interventions    Readmission Risk Interventions     No data to display

## 2022-04-02 NOTE — Consult Note (Signed)
PHARMACY - TOTAL PARENTERAL NUTRITION CONSULT NOTE   Indication: Prolonged ileus  Patient Measurements: Height: '5\' 3"'$  (160 cm) Weight: 101.6 kg (223 lb 15.8 oz) IBW/kg (Calculated) : 52.4 TPN AdjBW (KG): 62.1 Body mass index is 39.68 kg/m.  Assessment:  Patient is [name] with medical history including recent laparoscopic adhesiolysis, left salpingectomy, drainage of peritoneal cyst, and chromotubation on 7/24 who presented to the ED 7/27 with severe pain in abdomen and vagina. Imaging concerning for post-operative abscess. Patient underwent CT drain placement into pelvic abscess on 7/28. Hospital course has been complicated by ileus. Pharmacy consulted to initiate TPN to provide nutrition given post-operative ileus.   Glucose / Insulin: BG 105-143 requiring 2 units insulin aspart SSI in the past 24 hours.  Insulin Glargine 10u HS added 8/3 Electrolytes: WNL Renal: Scr < 1 Hepatic: LFTs remains WNL, Hyperbilirubinemia resolving Intake / Output; MIVF: JP drain left abdomen, NGT decompression, colostomy LUQ. Drain with minimal output. I&O: + 13.9L GI Imaging: 7/31 CT abdomen / pelvis: Percutaneous drainage catheter is seen in left-sided abdominal abscess. Mild proximal small bowel dilatation is noted which is slightly improved compared to prior exam, most consistent with ileus. Stable mild ascites is noted in the pelvis and right side of the abdomen.  GI Surgeries / Procedures:  7/28: CT drain placement into pelvic abscess 8/2: Pending ExLap with general surgery  Central access: PICC Double Lumen placed 8/1 TPN start date: 8/1   Nutritional Goals: Goal TPN rate is 75 mL/hr (provides 108 g of protein and 11/09/15 kcals per day)  RD Assessment: Estimated Needs Total Energy Estimated Needs: 2000-2300kcal/day Total Protein Estimated Needs: 100-115g/day Total Fluid Estimated Needs: 1.6-1.8L/day  Current Nutrition:  NPO  Plan:  Continue TPN at 75 mL/hr (goal rate)  Electrolytes in  TPN adjusted to: Na 97mq/L, K 558m/L, Ca 52m64mL, Mg 52mE752m, and Phos 52mmo68m. Cl:Ac 1:1 Phos in TPN deceased on 87 du03/16/1987to hyperphosphatemia with normal renal function. Phos today WNL  Add standard MVI and trace elements to TPN Thiamine 100 mg x 3 days (completed) Continue Moderate q6h SSI and adjust as needed  Continue insulin glargine 10 units at bedtime Monitor TPN labs on Mon/Thurs  Harinder Romas Rodriguez-Guzman PharmD, BCPS 04/02/2022 7:43 AM

## 2022-04-02 NOTE — Progress Notes (Signed)
Date of Admission:  03/21/2022    ID: Lacey Harrison is a 36 y.o. female  Principal Problem:   Postoperative intra-abdominal abscess Active Problems:   Acute respiratory failure with hypoxia (HCC) in seting of MSKpain, atelectasis, severe sepsis    Severe sepsis (HCC) without septic shock, d/t postoperative abscess, s/p pelvic drain placement    Ileus, postoperative (HCC)   AKI (acute kidney injury) ([place]) in setting of sepsis, IV contrast, NAGMA - RESOLVED    Abdominal pain    Subjective: Underwent 2 more drain placement today Some abdominal distension and pain Medications:   Chlorhexidine Gluconate Cloth  6 each Topical Daily   cyclobenzaprine  7.5 mg Oral TID   enoxaparin (LOVENOX) injection  0.5 mg/kg Subcutaneous Q24H   fentaNYL       insulin aspart  0-15 Units Subcutaneous Q6H   insulin glargine-yfgn  10 Units Subcutaneous QHS   midazolam       pantoprazole  40 mg Oral QHS   pregabalin  100 mg Oral TID   sodium chloride flush  10-40 mL Intracatheter Q12H   sodium chloride flush  5 mL Intracatheter Q8H    Objective: Vital signs in last 24 hours: Temp:  [98.2 F (36.8 C)-99.8 F (37.7 C)] 99.6 F (37.6 C) (08/08 1450) Pulse Rate:  [90-120] 117 (08/08 1450) Resp:  [17-24] 20 (08/08 1220) BP: (86-123)/(50-81) 104/57 (08/08 1450) SpO2:  [89 %-100 %] 94 % (08/08 1450) Weight:  [101.6 kg] 101.6 kg (08/08 0500)  LDA Rt PICc   PHYSICAL EXAM:  General: awake Alert, cooperative, some  distress,  Lungs: b/l air entry. Heart: Tachycardia Abdomen: lap site covered with wound vac Colostomy stoma- dark red 3 drains- 2 left , 1 right Extremities: edema arms and legs improved Skin: No rashes or lesions. Or bruising Lymph: Cervical, supraclavicular normal. Neurologic: Grossly non-focal  Lab Results Recent Labs    03/31/22 0611 04/01/22 0435 04/02/22 0436  WBC 18.0* 19.6* 19.0*  HGB 8.8* 8.9* 8.5*  HCT 28.3* 28.5* 27.1*  NA 139 140  --   K 4.3 4.4  --   CL  106 107  --   CO2 26 24  --   BUN 11 10  --   CREATININE 0.46 0.52  --    Liver Panel Recent Labs    04/01/22 0435  PROT 5.9*  ALBUMIN 2.0*  AST 33  ALT 30  ALKPHOS 76  BILITOT 2.3*    Microbiology:  ABUNDANT WBC PRESENT,BOTH PMN AND MONONUCLEAR  ABUNDANT GRAM NEGATIVE RODS  FEW GRAM POSITIVE RODS  FEW GRAM POSITIVE COCCI IN PAIRS  FEW BUDDING YEAST SEEN   Culture ABUNDANT ESCHERICHIA COLI  MODERATE CLOSTRIDIUM PERFRINGENS  Standardized susceptibility testing for this organism is not available.  ABUNDANT BACTEROIDES VULGATUS  BETA LACTAMASE NEGATIVE    Studies/Results: CT GUIDED PERITONEAL/RETROPERITONEAL FLUID DRAIN BY PERC CATH  Result Date: 04/02/2022 INDICATION: Intra-abdominal collections EXAM: 1. CT-guided placement of abdominal drainage catheter into the right lower quadrant collection 2. CT-guided placement of abdominal drainage catheter into left lower quadrant/pelvic collection TECHNIQUE: Multidetector CT imaging of the abdomen and pelvis was performed following the standard protocol without IV contrast. RADIATION DOSE REDUCTION: This exam was performed according to the departmental dose-optimization program which includes automated exposure control, adjustment of the mA and/or kV according to patient size and/or use of iterative reconstruction technique. MEDICATIONS: The patient is currently admitted to the hospital and receiving intravenous antibiotics. The antibiotics were administered within an appropriate time frame  prior to the initiation of the procedure. ANESTHESIA/SEDATION: Moderate (conscious) sedation was employed during this procedure. A total of Versed 3 mg and Fentanyl 100 mcg was administered intravenously by the radiology nurse. Total intra-service moderate Sedation Time: 50 minutes. The patient's level of consciousness and vital signs were monitored continuously by radiology nursing throughout the procedure under my direct supervision. COMPLICATIONS: None  immediate. PROCEDURE: Informed written consent was obtained from the patient after a thorough discussion of the procedural risks, benefits and alternatives. All questions were addressed. Maximal Sterile Barrier Technique was utilized including caps, mask, sterile gowns, sterile gloves, sterile drape, hand hygiene and skin antiseptic. A timeout was performed prior to the initiation of the procedure. The patient was placed supine on the exam table. Limited CT of the abdomen and pelvis was performed for planning purposes. This again demonstrated multiple fluid collections within the abdomen and pelvis. Attention was first turned to the right abdomen. Skin entry site was marked, and the overlying skin was prepped and draped in a standard sterile fashion. Local analgesia was obtained with 1% lidocaine. Using intermittent CT fluoroscopy, a 19 gauge Yueh needle was advanced towards the right infrahepatic fluid collection. Location was confirmed with CT and return of thick serous material. An 035 wire was advanced through the access needle. Over this wire, the percutaneous tract was serially dilated accommodate a 12 [place] multipurpose locking drainage catheter. Location was again confirmed with CT and return of additional thick serous material. A sample was sent to the lab for analysis. The drainage catheter was secured to the skin using silk suture and a dressing. It was attached to bag drainage. Attention was then turned to the left side. Skin entry site was marked, and the overlying skin was prepped and draped in the standard sterile fashion. Local analgesia was obtained with 1% lidocaine. Using intermittent CT fluoroscopy, a 19 gauge Yueh needle was advanced towards the left lower quadrant/pelvic collection. Location was confirmed with CT and return of thick serous material. An 035 wire was advanced through the access needle. Over this wire, the percutaneous tract was serially dilated accommodate a 12 [place]  multipurpose locking drainage catheter. Location was again confirmed with CT and return of additional thick serous material. A sample was sent to the lab for analysis. The drainage catheter was secured to the skin using silk suture and a dressing. It was attached to bulb suction. Patient tolerated all aspects of the procedure well without immediate complication. IMPRESSION: 1. Successful CT-guided placement of a 12 French locking drainage catheter into the right infrahepatic fluid collection. Thick serous fluid was returned. A sample was sent to the lab for microbiology analysis. 2. Successful CT-guided placement of a 12 French locking drainage catheter into the left lower quadrant/pelvic fluid collection. Thick serous fluid was returned. A sample was sent to the lab for microbiology analysis. Electronically Signed   By: Albin Felling M.D.   On: 04/02/2022 14:21   CT GUIDED PERITONEAL/RETROPERITONEAL FLUID DRAIN BY PERC CATH  Result Date: 04/02/2022 INDICATION: Intra-abdominal collections EXAM: 1. CT-guided placement of abdominal drainage catheter into the right lower quadrant collection 2. CT-guided placement of abdominal drainage catheter into left lower quadrant/pelvic collection TECHNIQUE: Multidetector CT imaging of the abdomen and pelvis was performed following the standard protocol without IV contrast. RADIATION DOSE REDUCTION: This exam was performed according to the departmental dose-optimization program which includes automated exposure control, adjustment of the mA and/or kV according to patient size and/or use of iterative reconstruction technique. MEDICATIONS: The patient  is currently admitted to the hospital and receiving intravenous antibiotics. The antibiotics were administered within an appropriate time frame prior to the initiation of the procedure. ANESTHESIA/SEDATION: Moderate (conscious) sedation was employed during this procedure. A total of Versed 3 mg and Fentanyl 100 mcg was administered  intravenously by the radiology nurse. Total intra-service moderate Sedation Time: 50 minutes. The patient's level of consciousness and vital signs were monitored continuously by radiology nursing throughout the procedure under my direct supervision. COMPLICATIONS: None immediate. PROCEDURE: Informed written consent was obtained from the patient after a thorough discussion of the procedural risks, benefits and alternatives. All questions were addressed. Maximal Sterile Barrier Technique was utilized including caps, mask, sterile gowns, sterile gloves, sterile drape, hand hygiene and skin antiseptic. A timeout was performed prior to the initiation of the procedure. The patient was placed supine on the exam table. Limited CT of the abdomen and pelvis was performed for planning purposes. This again demonstrated multiple fluid collections within the abdomen and pelvis. Attention was first turned to the right abdomen. Skin entry site was marked, and the overlying skin was prepped and draped in a standard sterile fashion. Local analgesia was obtained with 1% lidocaine. Using intermittent CT fluoroscopy, a 19 gauge Yueh needle was advanced towards the right infrahepatic fluid collection. Location was confirmed with CT and return of thick serous material. An 035 wire was advanced through the access needle. Over this wire, the percutaneous tract was serially dilated accommodate a 12 [place] multipurpose locking drainage catheter. Location was again confirmed with CT and return of additional thick serous material. A sample was sent to the lab for analysis. The drainage catheter was secured to the skin using silk suture and a dressing. It was attached to bag drainage. Attention was then turned to the left side. Skin entry site was marked, and the overlying skin was prepped and draped in the standard sterile fashion. Local analgesia was obtained with 1% lidocaine. Using intermittent CT fluoroscopy, a 19 gauge Yueh needle was  advanced towards the left lower quadrant/pelvic collection. Location was confirmed with CT and return of thick serous material. An 035 wire was advanced through the access needle. Over this wire, the percutaneous tract was serially dilated accommodate a 12 [place] multipurpose locking drainage catheter. Location was again confirmed with CT and return of additional thick serous material. A sample was sent to the lab for analysis. The drainage catheter was secured to the skin using silk suture and a dressing. It was attached to bulb suction. Patient tolerated all aspects of the procedure well without immediate complication. IMPRESSION: 1. Successful CT-guided placement of a 12 French locking drainage catheter into the right infrahepatic fluid collection. Thick serous fluid was returned. A sample was sent to the lab for microbiology analysis. 2. Successful CT-guided placement of a 12 French locking drainage catheter into the left lower quadrant/pelvic fluid collection. Thick serous fluid was returned. A sample was sent to the lab for microbiology analysis. Electronically Signed   By: Albin Felling M.D.   On: 04/02/2022 14:21   CT ABDOMEN PELVIS W CONTRAST  Result Date: 04/01/2022 CLINICAL DATA:  Five days status post exploratory laparotomy and Hartmann procedure for perforated sigmoid colon. Abdominal pain. Low-grade fever. Increasing leukocytosis. Awaiting return of bowel function. EXAM: CT ABDOMEN AND PELVIS WITH CONTRAST TECHNIQUE: Multidetector CT imaging of the abdomen and pelvis was performed using the standard protocol following bolus administration of intravenous contrast. RADIATION DOSE REDUCTION: This exam was performed according to the departmental dose-optimization program  which includes automated exposure control, adjustment of the mA and/or kV according to patient size and/or use of iterative reconstruction technique. CONTRAST:  161m OMNIPAQUE IOHEXOL 300 MG/ML  SOLN COMPARISON:  KUB 03/29/2022 and  03/27/2022; barium enema study demonstrating sigmoid colon bowel injury/perforation 03/26/2022; CT abdomen pelvis 03/25/2022 FINDINGS: Lower chest: Moderate bandlike atelectasis with air bronchograms within the bilateral posteromedial lower lobes, improved from 03/25/2022 prior CT. No pleural effusion. Heart size is at the upper limits of normal. Partial visualization of central venous catheter tip terminating at the superior vena cava/right atrial junction seen to be a right upper extremity PICC line on 03/31/2022 AP chest. Hepatobiliary: There is diffuse decreased density seen throughout the liver again suggesting fatty infiltration. There is layering contrast within the gallbladder likely from vicarious excretion. No intrahepatic or extrahepatic biliary ductal dilatation is seen. Pancreas: Unremarkable. No pancreatic ductal dilatation or surrounding inflammatory changes. Spleen: The spleen measures 13.7 cm in craniocaudal dimension, borderline enlarged and unchanged from prior. Adrenals/Urinary Tract: Normal bilateral adrenals. The kidneys enhance uniformly are symmetric in size without hydronephrosis. No focal urinary bladder wall thickening is seen. There is mild nondependent air within the urinary bladder lumen. Stomach/Bowel: Oral contrast is seen as distal as the rectum. There is a left upper quadrant distal descending colostomy. This is new from prior. There now appears to be a blind-ending rectal pouch consistent with interval Hartmann procedure. There is mild-to-moderate wall thickening of the postsurgical proximal rectum. Oral contrast is seen as distal as small bowel within the mid to upper abdomen (axial series 2, image 34) with more distal small bowel not filling with oral contrast likely just due to the timing of contrast administration as no transition point or definitive location of small bowel obstruction is seen. Findings are compatible with postoperative ileus with air-fluid level seen  throughout the small bowel and multifocal areas of mild-to-moderate wall thickening. The appendix normally fills with contrast without dilatation or inflammatory changes seen. Vascular/Lymphatic: No abdominal aortic aneurysm. No mesenteric, retroperitoneal, or pelvic lymphadenopathy. Reproductive: The uterus is present.  No gross adnexal abnormality. Other: A left pelvic approach drain is seen with tip curling within the right lower quadrant just inferior to the cecum. Interval removal of the prior left approach pigtail drainage catheter previously terminating within the left hemipelvis. There is mild interval increase in moderate fluid within the pelvis again with thin peripheral wall enhancement. Punctate focus of nondependent gas is seen within this fluid (axial series 2, image 73), decreased from prior. There is also a small focus of nondependent air within a portion of the pelvic fluid that extends into the right hemipelvis (axial image 70). There is mild interval increase in a thin rim enhancing fluid collection inferior to the liver, measuring up to approximately 4.8 by 13.9 by 16 cm (transverse by AP by craniocaudal) compared to approximately 3.3 x 10.0 x 16.7 cm on 03/25/2022. This fluid again extends down to the pelvis and is now contiguous with the ascites within the pelvis. There is punctate gas within the anterior superior aspect of this fluid collection (axial images 41 through 44). Overall there is mild-to-moderate ascites. There is a punctate focus of free air bordering the mid transverse dimension of the anterior inferior liver (axial series 2, image 29), decreased from prior. Punctate anterior midline abdominal pneumoperitoneum (axial series 2, image 54). Musculoskeletal: There is a new midline lower abdominal upper pelvic abdominal soft tissue defect with likely a vacuum assisted drain in this location. Moderate anasarca. No acute  skeletal abnormality. IMPRESSION: Compared to 03/25/2022: 1.  Interval likely partial sigmoid resection with new descending colon distal colostomy and new Hartmann pouch. 2. Interval removal of the prior left approach pigtail drainage catheter previously terminating within the left hemipelvis and placement of a new left approach drain terminating within the right lower quadrant just inferior to the cecum. Mild interval increase in fluid collections inferior to the liver extending into the right and left hemipelvis with small foci of air which may be secondary to infected nature of this fluid. 3. Punctate pneumoperitoneum, mildly decreased from prior. 4. Mild air within the nondependent urinary bladder. Recommend clinical correlation for recent instrumentation that would explain this. Please note gas forming infectious organism can also cause urinary bladder air, however no urinary bladder wall thickening is seen to suggest cystitis. Electronically Signed   By: Yvonne Kendall M.D.   On: 04/01/2022 16:33   DG Chest 1 View  Result Date: 03/31/2022 CLINICAL DATA:  Fever EXAM: CHEST  1 VIEW COMPARISON:  03/22/2022 FINDINGS: Right PICC line in place with the tip in the right atrium. Low lung volumes, bibasilar atelectasis. Heart is normal size. No effusions or acute bony abnormality. IMPRESSION: Low lung volumes, bibasilar atelectasis. Electronically Signed   By: Rolm Baptise M.D.   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  Multiple organisms of colon origin and culture.  E. coli, Bacteroides, Clostridium perfringens and yeast.  Patient underwent exploratory laparotomy and sigmoid colon resection with colostomy.Fecal peritonitis was present has been on Unasyn and fluconazole Because of worsening leucocytosis changed  unasyn to meropenem  CT abdomen showed further collection and today IR placed 2 more drains- culture sent-    Fever has resolved Anemia  AKI has resolved  Septic shock has resolved  Hypoalbuminemia  Hyperbilirubinemia   Discussed the management with the patient , care team and family at bed side

## 2022-04-02 NOTE — Progress Notes (Signed)
Inpatient Rehab Coordinator Note:  Late entry:   I met with patient and her family at bedside to discuss CIR recommendations and goals/expectations of CIR stay.  We reviewed 3 hrs/day of therapy, physician follow up, and average length of stay 2 weeks (dependent upon progress) with goals of intermittent supervision.  Discussed insurance prior auth process, and expectations for d/c.  Pt would need to be able to be alone for several hours/day while family works, which I think should be reasonable after a rehab stay.  We discussed differences between CIR program and SNF rehab including higher intensity and physician followup that is  Required for a CIR program.  She continues to be limited by resolving ileus, abdominal abscesses, and pain; she requires TPN and IV pain control.  We discussed that I will continue to follow and we can further discuss rehab needs as she stabilizes.  Shann Medal, PT, DPT Admissions Coordinator [phone] 04/02/22  10:43 AM

## 2022-04-02 NOTE — Procedures (Signed)
Interventional Radiology Procedure Note  Date of Procedure: 04/02/2022  Procedure: CT drain placement   Findings:  1. CT guided placement of 12 Fr drain into RLQ fluid collection, to bag drain   2. CT guided placement of 12 Fr drain into LLQ/pelvic fluid collection, to bulb drain    Complications: No immediate complications noted.   Estimated Blood Loss: minimal  Follow-up and Recommendations: 1. Flush x3 daily  2. Follow up cultures  3. Per surgery    Albin Felling, MD  Vascular & Interventional Radiology  04/02/2022 12:57 PM

## 2022-04-03 DIAGNOSIS — T8143XA Infection following a procedure, organ and space surgical site, initial encounter: Secondary | ICD-10-CM | POA: Diagnosis not present

## 2022-04-03 LAB — CBC
HCT: 25.9 % — ABNORMAL LOW (ref 36.0–46.0)
Hemoglobin: 8 g/dL — ABNORMAL LOW (ref 12.0–15.0)
MCH: 28.5 pg (ref 26.0–34.0)
MCHC: 30.9 g/dL (ref 30.0–36.0)
MCV: 92.2 fL (ref 80.0–100.0)
Platelets: 392 10*3/uL (ref 150–400)
RBC: 2.81 MIL/uL — ABNORMAL LOW (ref 3.87–5.11)
RDW: 14.4 % (ref 11.5–15.5)
WBC: 13.7 10*3/uL — ABNORMAL HIGH (ref 4.0–10.5)
nRBC: 0 % (ref 0.0–0.2)

## 2022-04-03 LAB — GLUCOSE, CAPILLARY
Glucose-Capillary: 134 mg/dL — ABNORMAL HIGH (ref 70–99)
Glucose-Capillary: 137 mg/dL — ABNORMAL HIGH (ref 70–99)
Glucose-Capillary: 150 mg/dL — ABNORMAL HIGH (ref 70–99)
Glucose-Capillary: 145 mg/dL — ABNORMAL HIGH (ref 70–99)

## 2022-04-03 MED ORDER — ENSURE ENLIVE PO LIQD
237.0000 mL | Freq: Three times a day (TID) | ORAL | Status: DC
  Administered 2022-04-04 – 2022-04-10 (×11): 237 mL via ORAL

## 2022-04-03 MED ORDER — ORAL CARE MOUTH RINSE: 15.0000 mL | OROMUCOSAL | Status: DC | PRN

## 2022-04-03 MED ORDER — ENSURE ENLIVE PO LIQD
237.0000 mL | Freq: Two times a day (BID) | ORAL | Status: DC
  Administered 2022-04-03 (×2): 237 mL via ORAL

## 2022-04-03 MED ORDER — FENTANYL CITRATE PF 50 MCG/ML IJ SOSY
25.0000 ug | PREFILLED_SYRINGE | INTRAMUSCULAR | Status: DC | PRN
  Administered 2022-04-03 – 2022-04-04 (×3): 25 ug via INTRAVENOUS
  Filled 2022-04-03 (×3): qty 1

## 2022-04-03 MED ORDER — TRACE MINERALS CU-MN-SE-ZN 300-55-60-3000 MCG/ML IV SOLN
INTRAVENOUS | Status: AC
  Filled 2022-04-03: qty 674.53

## 2022-04-03 MED ORDER — FENTANYL CITRATE PF 50 MCG/ML IJ SOSY
12.5000 ug | PREFILLED_SYRINGE | INTRAMUSCULAR | Status: DC | PRN
  Administered 2022-04-03: 12.5 ug via INTRAVENOUS
  Filled 2022-04-03: qty 1

## 2022-04-03 NOTE — Consult Note (Addendum)
[facility] Nurse Consult Note: Reason for Consult: Vac dressing change performed.  Wound type: Full thickness post-op wound to midline abd Pt was medicated for pain prior to the procedure and tolerated with mod amt discomfort.  Removed 2 pieces black sponge.  Wound is 100% beefy red with small amt bloody drainage.  Refer to previous notes for measurements. Applied barrier ring pieces to lower and middle wound edges to attempt to maintain a seal, then applied 1 piece black foam to 156m cont suction.  WHamdennurse will plan to change dressing again on Fri.  [facility] Nurse ostomy consult note Current colostomy pouch is intact with good seal, this was applied on Mon. Small amt liquid brown stool in the pouch.  Husband was ill on Mon and unable to attend ostomy pouch change and teaching session. Pt called him and he requests another teaching session be performed on Fri at 0730 and he will attend, along with patient's mother. Supplies at the bedside for staff nurse use, along with educational materials. [place] in [place]: Yes, previously Thank-you,  DJulien GirtMSN, [place]

## 2022-04-03 NOTE — Progress Notes (Signed)
Supervising Physician: Mir, Sharen Heck  Patient Status:  [facility] - In-pt  Chief Complaint:  Abdominal pain, intra-abdominal abscesses  Subjective:  Continues with abdominal pain  Allergies: Tylenol [acetaminophen]  Medications: Prior to Admission medications   Medication Sig Start Date End Date Taking? Authorizing Provider  ibuprofen (ADVIL) 800 MG tablet Take 1 tablet (800 mg total) by mouth every 8 (eight) hours as needed. 03/18/22  Yes Rubie Maid, MD  oxyCODONE (ROXICODONE) 5 MG immediate release tablet Take 1 tablet (5 mg total) by mouth every 8 (eight) hours as needed. 03/18/22 03/18/23 Yes Rubie Maid, MD  simethicone (GAS-X) 80 MG chewable tablet Chew 1 tablet (80 mg total) by mouth 4 (four) times daily as needed for flatulence. 03/18/22 03/18/23 Yes Rubie Maid, MD  Vitamin D, Ergocalciferol, (DRISDOL) 1.25 MG (50000 UNIT) CAPS capsule Take 50,000 Units by mouth every 7 (seven) days.   Yes [provider]  albuterol (VENTOLIN HFA) 108 (90 Base) MCG/ACT inhaler Inhale 1-2 puffs into the lungs every 6 (six) hours as needed for wheezing or shortness of breath. 09/25/21   Hazel Sams, PA-C     Vital Signs: BP 112/63 (BP Location: Left Arm)   Pulse (!) 116   Temp 99.5 F (37.5 C) (Oral)   Resp 16   Ht '5\' 3"'$  (1.6 m)   Wt 223 lb 15.8 oz (101.6 kg)   LMP 02/25/2022 (Approximate)   SpO2 94%   BMI 39.68 kg/m   Physical Exam Vitals reviewed.  Constitutional:      General: She is not in acute distress.    Appearance: She is ill-appearing.  HENT:     Head: Normocephalic and atraumatic.  Cardiovascular:     Rate and Rhythm: Tachycardia present.  Pulmonary:     Effort: Pulmonary effort is normal.  Abdominal:     General: Abdomen is flat.     Palpations: Abdomen is soft.     Tenderness: There is generalized abdominal tenderness.  Skin:    General: Skin is warm and dry.  Neurological:     General: No focal deficit present.     Mental Status: She is alert  and oriented to person, place, and time.  Psychiatric:        Mood and Affect: Mood normal.        Behavior: Behavior normal.   Drain Location: RLQ, LLQ Size: Fr size: 12 Fr Date of placement: 04/02/22  Currently to: Drain collection device: suction bulb 24 hour output:  Output by Drain (mL) 04/01/22 0700 - 04/01/22 1459 04/01/22 1500 - 04/01/22 2259 04/01/22 2300 - 04/02/22 0659 04/02/22 0700 - 04/02/22 1459 04/02/22 1500 - 04/02/22 2259 04/02/22 2300 - 04/03/22 0659 04/03/22 0700 - 04/03/22 1459 04/03/22 1500 - 04/03/22 1553  Closed System Drain 1 Left Abdomen Bulb (JP) 19 Fr.    '10  30 20   '$ Closed System Drain 1 Right;Anterior Abdomen 10 Fr.    70 40 30 20   Closed System Drain 1 Left;Anterior Hip 10 Fr.    50 '30 5 20     '$ Current examination: Insertion site unremarkable. Suture and stat lock in place. Dressed appropriately.   Imaging: CT GUIDED PERITONEAL/RETROPERITONEAL FLUID DRAIN BY PERC CATH  Result Date: 04/02/2022 INDICATION: Intra-abdominal collections EXAM: 1. CT-guided placement of abdominal drainage catheter into the right lower quadrant collection 2. CT-guided placement of abdominal drainage catheter into left lower quadrant/pelvic collection TECHNIQUE: Multidetector CT imaging of the abdomen and pelvis was performed following the standard protocol  without IV contrast. RADIATION DOSE REDUCTION: This exam was performed according to the departmental dose-optimization program which includes automated exposure control, adjustment of the mA and/or kV according to patient size and/or use of iterative reconstruction technique. MEDICATIONS: The patient is currently admitted to the hospital and receiving intravenous antibiotics. The antibiotics were administered within an appropriate time frame prior to the initiation of the procedure. ANESTHESIA/SEDATION: Moderate (conscious) sedation was employed during this procedure. A total of Versed 3 mg and Fentanyl 100 mcg was administered  intravenously by the radiology nurse. Total intra-service moderate Sedation Time: 50 minutes. The patient's level of consciousness and vital signs were monitored continuously by radiology nursing throughout the procedure under my direct supervision. COMPLICATIONS: None immediate. PROCEDURE: Informed written consent was obtained from the patient after a thorough discussion of the procedural risks, benefits and alternatives. All questions were addressed. Maximal Sterile Barrier Technique was utilized including caps, mask, sterile gowns, sterile gloves, sterile drape, hand hygiene and skin antiseptic. A timeout was performed prior to the initiation of the procedure. The patient was placed supine on the exam table. Limited CT of the abdomen and pelvis was performed for planning purposes. This again demonstrated multiple fluid collections within the abdomen and pelvis. Attention was first turned to the right abdomen. Skin entry site was marked, and the overlying skin was prepped and draped in a standard sterile fashion. Local analgesia was obtained with 1% lidocaine. Using intermittent CT fluoroscopy, a 19 gauge Yueh needle was advanced towards the right infrahepatic fluid collection. Location was confirmed with CT and return of thick serous material. An 035 wire was advanced through the access needle. Over this wire, the percutaneous tract was serially dilated accommodate a 12 [place] multipurpose locking drainage catheter. Location was again confirmed with CT and return of additional thick serous material. A sample was sent to the lab for analysis. The drainage catheter was secured to the skin using silk suture and a dressing. It was attached to bag drainage. Attention was then turned to the left side. Skin entry site was marked, and the overlying skin was prepped and draped in the standard sterile fashion. Local analgesia was obtained with 1% lidocaine. Using intermittent CT fluoroscopy, a 19 gauge Yueh needle was  advanced towards the left lower quadrant/pelvic collection. Location was confirmed with CT and return of thick serous material. An 035 wire was advanced through the access needle. Over this wire, the percutaneous tract was serially dilated accommodate a 12 [place] multipurpose locking drainage catheter. Location was again confirmed with CT and return of additional thick serous material. A sample was sent to the lab for analysis. The drainage catheter was secured to the skin using silk suture and a dressing. It was attached to bulb suction. Patient tolerated all aspects of the procedure well without immediate complication. IMPRESSION: 1. Successful CT-guided placement of a 12 French locking drainage catheter into the right infrahepatic fluid collection. Thick serous fluid was returned. A sample was sent to the lab for microbiology analysis. 2. Successful CT-guided placement of a 12 French locking drainage catheter into the left lower quadrant/pelvic fluid collection. Thick serous fluid was returned. A sample was sent to the lab for microbiology analysis. Electronically Signed   By: Albin Felling M.D.   On: 04/02/2022 14:21   CT GUIDED PERITONEAL/RETROPERITONEAL FLUID DRAIN BY PERC CATH  Result Date: 04/02/2022 INDICATION: Intra-abdominal collections EXAM: 1. CT-guided placement of abdominal drainage catheter into the right lower quadrant collection 2. CT-guided placement of abdominal drainage catheter into  left lower quadrant/pelvic collection TECHNIQUE: Multidetector CT imaging of the abdomen and pelvis was performed following the standard protocol without IV contrast. RADIATION DOSE REDUCTION: This exam was performed according to the departmental dose-optimization program which includes automated exposure control, adjustment of the mA and/or kV according to patient size and/or use of iterative reconstruction technique. MEDICATIONS: The patient is currently admitted to the hospital and receiving intravenous  antibiotics. The antibiotics were administered within an appropriate time frame prior to the initiation of the procedure. ANESTHESIA/SEDATION: Moderate (conscious) sedation was employed during this procedure. A total of Versed 3 mg and Fentanyl 100 mcg was administered intravenously by the radiology nurse. Total intra-service moderate Sedation Time: 50 minutes. The patient's level of consciousness and vital signs were monitored continuously by radiology nursing throughout the procedure under my direct supervision. COMPLICATIONS: None immediate. PROCEDURE: Informed written consent was obtained from the patient after a thorough discussion of the procedural risks, benefits and alternatives. All questions were addressed. Maximal Sterile Barrier Technique was utilized including caps, mask, sterile gowns, sterile gloves, sterile drape, hand hygiene and skin antiseptic. A timeout was performed prior to the initiation of the procedure. The patient was placed supine on the exam table. Limited CT of the abdomen and pelvis was performed for planning purposes. This again demonstrated multiple fluid collections within the abdomen and pelvis. Attention was first turned to the right abdomen. Skin entry site was marked, and the overlying skin was prepped and draped in a standard sterile fashion. Local analgesia was obtained with 1% lidocaine. Using intermittent CT fluoroscopy, a 19 gauge Yueh needle was advanced towards the right infrahepatic fluid collection. Location was confirmed with CT and return of thick serous material. An 035 wire was advanced through the access needle. Over this wire, the percutaneous tract was serially dilated accommodate a 12 [place] multipurpose locking drainage catheter. Location was again confirmed with CT and return of additional thick serous material. A sample was sent to the lab for analysis. The drainage catheter was secured to the skin using silk suture and a dressing. It was attached to bag  drainage. Attention was then turned to the left side. Skin entry site was marked, and the overlying skin was prepped and draped in the standard sterile fashion. Local analgesia was obtained with 1% lidocaine. Using intermittent CT fluoroscopy, a 19 gauge Yueh needle was advanced towards the left lower quadrant/pelvic collection. Location was confirmed with CT and return of thick serous material. An 035 wire was advanced through the access needle. Over this wire, the percutaneous tract was serially dilated accommodate a 12 [place] multipurpose locking drainage catheter. Location was again confirmed with CT and return of additional thick serous material. A sample was sent to the lab for analysis. The drainage catheter was secured to the skin using silk suture and a dressing. It was attached to bulb suction. Patient tolerated all aspects of the procedure well without immediate complication. IMPRESSION: 1. Successful CT-guided placement of a 12 French locking drainage catheter into the right infrahepatic fluid collection. Thick serous fluid was returned. A sample was sent to the lab for microbiology analysis. 2. Successful CT-guided placement of a 12 French locking drainage catheter into the left lower quadrant/pelvic fluid collection. Thick serous fluid was returned. A sample was sent to the lab for microbiology analysis. Electronically Signed   By: Albin Felling M.D.   On: 04/02/2022 14:21   CT ABDOMEN PELVIS W CONTRAST  Result Date: 04/01/2022 CLINICAL DATA:  Five days status post exploratory laparotomy  and Hartmann procedure for perforated sigmoid colon. Abdominal pain. Low-grade fever. Increasing leukocytosis. Awaiting return of bowel function. EXAM: CT ABDOMEN AND PELVIS WITH CONTRAST TECHNIQUE: Multidetector CT imaging of the abdomen and pelvis was performed using the standard protocol following bolus administration of intravenous contrast. RADIATION DOSE REDUCTION: This exam was performed according to the  departmental dose-optimization program which includes automated exposure control, adjustment of the mA and/or kV according to patient size and/or use of iterative reconstruction technique. CONTRAST:  14m OMNIPAQUE IOHEXOL 300 MG/ML  SOLN COMPARISON:  KUB 03/29/2022 and 03/27/2022; barium enema study demonstrating sigmoid colon bowel injury/perforation 03/26/2022; CT abdomen pelvis 03/25/2022 FINDINGS: Lower chest: Moderate bandlike atelectasis with air bronchograms within the bilateral posteromedial lower lobes, improved from 03/25/2022 prior CT. No pleural effusion. Heart size is at the upper limits of normal. Partial visualization of central venous catheter tip terminating at the superior vena cava/right atrial junction seen to be a right upper extremity PICC line on 03/31/2022 AP chest. Hepatobiliary: There is diffuse decreased density seen throughout the liver again suggesting fatty infiltration. There is layering contrast within the gallbladder likely from vicarious excretion. No intrahepatic or extrahepatic biliary ductal dilatation is seen. Pancreas: Unremarkable. No pancreatic ductal dilatation or surrounding inflammatory changes. Spleen: The spleen measures 13.7 cm in craniocaudal dimension, borderline enlarged and unchanged from prior. Adrenals/Urinary Tract: Normal bilateral adrenals. The kidneys enhance uniformly are symmetric in size without hydronephrosis. No focal urinary bladder wall thickening is seen. There is mild nondependent air within the urinary bladder lumen. Stomach/Bowel: Oral contrast is seen as distal as the rectum. There is a left upper quadrant distal descending colostomy. This is new from prior. There now appears to be a blind-ending rectal pouch consistent with interval Hartmann procedure. There is mild-to-moderate wall thickening of the postsurgical proximal rectum. Oral contrast is seen as distal as small bowel within the mid to upper abdomen (axial series 2, image 34) with more  distal small bowel not filling with oral contrast likely just due to the timing of contrast administration as no transition point or definitive location of small bowel obstruction is seen. Findings are compatible with postoperative ileus with air-fluid level seen throughout the small bowel and multifocal areas of mild-to-moderate wall thickening. The appendix normally fills with contrast without dilatation or inflammatory changes seen. Vascular/Lymphatic: No abdominal aortic aneurysm. No mesenteric, retroperitoneal, or pelvic lymphadenopathy. Reproductive: The uterus is present.  No gross adnexal abnormality. Other: A left pelvic approach drain is seen with tip curling within the right lower quadrant just inferior to the cecum. Interval removal of the prior left approach pigtail drainage catheter previously terminating within the left hemipelvis. There is mild interval increase in moderate fluid within the pelvis again with thin peripheral wall enhancement. Punctate focus of nondependent gas is seen within this fluid (axial series 2, image 73), decreased from prior. There is also a small focus of nondependent air within a portion of the pelvic fluid that extends into the right hemipelvis (axial image 70). There is mild interval increase in a thin rim enhancing fluid collection inferior to the liver, measuring up to approximately 4.8 by 13.9 by 16 cm (transverse by AP by craniocaudal) compared to approximately 3.3 x 10.0 x 16.7 cm on 03/25/2022. This fluid again extends down to the pelvis and is now contiguous with the ascites within the pelvis. There is punctate gas within the anterior superior aspect of this fluid collection (axial images 41 through 44). Overall there is mild-to-moderate ascites. There is a punctate  focus of free air bordering the mid transverse dimension of the anterior inferior liver (axial series 2, image 29), decreased from prior. Punctate anterior midline abdominal pneumoperitoneum (axial  series 2, image 54). Musculoskeletal: There is a new midline lower abdominal upper pelvic abdominal soft tissue defect with likely a vacuum assisted drain in this location. Moderate anasarca. No acute skeletal abnormality. IMPRESSION: Compared to 03/25/2022: 1. Interval likely partial sigmoid resection with new descending colon distal colostomy and new Hartmann pouch. 2. Interval removal of the prior left approach pigtail drainage catheter previously terminating within the left hemipelvis and placement of a new left approach drain terminating within the right lower quadrant just inferior to the cecum. Mild interval increase in fluid collections inferior to the liver extending into the right and left hemipelvis with small foci of air which may be secondary to infected nature of this fluid. 3. Punctate pneumoperitoneum, mildly decreased from prior. 4. Mild air within the nondependent urinary bladder. Recommend clinical correlation for recent instrumentation that would explain this. Please note gas forming infectious organism can also cause urinary bladder air, however no urinary bladder wall thickening is seen to suggest cystitis. Electronically Signed   By: Yvonne Kendall M.D.   On: 04/01/2022 16:33   DG Chest 1 View  Result Date: 03/31/2022 CLINICAL DATA:  Fever EXAM: CHEST  1 VIEW COMPARISON:  03/22/2022 FINDINGS: Right PICC line in place with the tip in the right atrium. Low lung volumes, bibasilar atelectasis. Heart is normal size. No effusions or acute bony abnormality. IMPRESSION: Low lung volumes, bibasilar atelectasis. Electronically Signed   By: Rolm Baptise M.D.   On: 03/31/2022 19:04    Labs:  CBC: Recent Labs    03/31/22 0611 04/01/22 0435 04/02/22 0436 04/03/22 0525  WBC 18.0* 19.6* 19.0* 13.7*  HGB 8.8* 8.9* 8.5* 8.0*  HCT 28.3* 28.5* 27.1* 25.9*  PLT 275 319 377 392    COAGS: Recent Labs    03/22/22 1048 04/02/22 0436  INR 1.3* 1.0  APTT 34  --     BMP: Recent Labs     03/29/22 0422 03/30/22 0740 03/31/22 0611 04/01/22 0435  NA 143 141 139 140  K 3.3* 3.9 4.3 4.4  CL 112* 106 106 107  CO2 '28 26 26 24  '$ GLUCOSE 148* 127* 105* 106*  BUN '9 11 11 10  '$ CALCIUM 7.5* 8.3* 7.9* 8.2*  CREATININE 0.41* 0.48 0.46 0.52  GFRNONAA >60 >60 >60 >60    LIVER FUNCTION TESTS: Recent Labs    03/27/22 0747 03/28/22 0512 03/29/22 0422 04/01/22 0435  BILITOT 4.5* 2.8* 2.1* 2.3*  AST 34 31 32 33  ALT '17 19 18 30  '$ ALKPHOS 56 44 48 76  PROT 5.3* 4.4* 4.2* 5.9*  ALBUMIN 2.2* 2.1* 1.8* 2.0*    Assessment:  Intra-abdominal abscess --1 day s/p drain placement --both with serosanguinous drainage --have not begun flushing as of yet --WBC dropped to 13.7 (19.0)  Plan: Begin TID flushes with 5 cc NS. Record output Q shift. Dressing changes QD or PRN if soiled.  Call IR APP or on call IR MD if difficulty flushing or sudden change in drain output.  Repeat imaging/possible drain injection once output < 10 mL/QD (excluding flush material). Consideration for drain removal if output is < 10 mL/QD (excluding flush material), pending discussion with the providing surgical service.  Discharge planning: Please contact IR APP or on call IR MD prior to patient d/c to ensure appropriate follow up plans are in place. Typically  patient will follow up with IR clinic 10-14 days post d/c for repeat imaging/possible drain injection. IR scheduler will contact patient with date/time of appointment. Patient will need to flush drain QD with 5 cc NS, record output QD, dressing changes every 2-3 days or earlier if soiled.   IR will continue to follow - please call with questions or concerns.  Electronically Signed: Pasty Spillers, PA 04/03/2022, 3:50 PM   I spent a total of 15 Minutes at the the patient's bedside AND on the patient's hospital floor or unit, greater than 50% of which was counseling/coordinating care for drain management

## 2022-04-03 NOTE — Progress Notes (Signed)
Nutrition Follow Up Note   DOCUMENTATION CODES:   Obesity unspecified  INTERVENTION:   Continue TPN per pharmacy- provides 1996kcal/day and 101g/day protein    Daily weights   Ensure Enlive po TID, each supplement provides 350 kcal and 20 grams of protein.  NUTRITION DIAGNOSIS:   Inadequate oral intake related to acute illness as evidenced by NPO status.  GOAL:   Patient will meet greater than or equal to 90% of their needs -met with TPN  MONITOR:   PO intake, Supplement acceptance, Labs, Weight trends, Skin, I & O's, Diet advancement, TPN   ASSESSMENT:   35 y.o. [id number] female with h/o anxiety, depression, lyme's disease, migraines, infertility, pelvic pain, left hydrosalpinx, dermoid cyst s/p ovarian cystectomy with an incidental cystotomy and repair (2007), s/p laparoscopy with extensive abdominopelvic adhesiolysis  with chromopertubation (01/2018), endometriosis and s/p robotic-assisted laparoscopic adhesiolysis, left salpingectomy, drainage of peritoneal cyst, and chromotubation (02/13/29) complicated by pelvic abscess s/p IR drain, AKI, sepsis, bowel perforation s/p Hartmanns 8/2 and post op ileus.  -Pt s/p IR guided 89F lower abdominopelvic percutaneous drain 7/28 - Pt s/p ex lap with LOA and Hartmann's procedure 8/2 -Pt s/p CT guided drain placement x 2 8/8  Pt tolerating TPN well at goal rate. Refeed labs stable. Hyperglycemia resolved with addition of Semglee. Triglycerides remain elevated and this appears chronic (190-9/22, 409-6/22, 320-3/22); will continue to monitor twice weekly. CT scan from 8/7 reports increased fluid collections and pt had two CT guided drains placed yesterday. Pt initiated on full liquid diet today; Ensure added by PA. Pt ate ~50% of her lunch today. Pt reports soreness mainly at drain sites. No ostomy output yet. Per chart, pt is up ~23lbs since admit; pt + 18.3L on her I & Os. Plan is to concentrate TPN today; pt's rate decreased slightly. Urine  output is not recorded.   Medications reviewed and include: lovenox, insulin, protonix, diflucan, meropenem  Labs reviewed: K 4.4 wnl, Mg 2.1 wnl- 8/7 P 4.5 wnl- 8/8 Triglycerides- 318(H)- 8/7 Wbc- 13.7(H), Hgb 8.0(L), Hct 25.9(L) Cbgs- 137, 134 x 24 hrs  Drains:  - Surgical Drain: 40 ccs: serous - LLQ Drain (IR): 85 ccs; serous - RLQ Drain (IR): 140 ccs; thicker serous fluid  Diet Order:    Diet Order             Diet full liquid Room service appropriate? Yes; Fluid consistency: Thin  Diet effective now                  EDUCATION NEEDS:   Education needs have been addressed  Skin:  Skin Assessment: Reviewed RN Assessment (open surgical incision)  Last BM:  8/2  Height:   Ht Readings from Last 1 Encounters:  03/21/22 '5\' 3"'  (1.6 m)    Weight:   Wt Readings from Last 1 Encounters:  04/02/22 101.6 kg    Ideal Body Weight:  52.2 kg  BMI:  Body mass index is 39.68 kg/m.  Estimated Nutritional Needs:   Kcal:  2000-2300kcal/day  Protein:  100-115g/day  Fluid:  1.6-1.8L/day  Koleen Distance MS, RD, LDN Please refer to [facility] for RD and/or RD on-call/weekend/after hours pager

## 2022-04-03 NOTE — Progress Notes (Cosign Needed Addendum)
[facility] Day(s): [place] op day(s): 7 Days Post-Op.   Interval History:  Patient seen and examined No acute events or new complaints overnight.  Patient reports she is doing okay; soreness today is primarily at drain sites No fever, chills, nausea, emesis Leukocytosis continues to improve; now 13.7K Hgb to 8.0; ? dilutional; no evidence of bleeding Drains are as follows:   - Surgical Drain: 40 ccs: serous  - LLQ Drain (IR): 85 ccs; serous  - RLQ Drain (IR): 140 ccs; thicker serous fluid Now on meropenem; new Cx w/o growth  Ostomy just with bowel sweat; no gas She is on CLD; tolerating  Vital signs in last 24 hours: [min-max] current  Temp:  [98.5 F (36.9 C)-99.8 F (37.7 C)] 98.5 F (36.9 C) (08/09 0309) Pulse Rate:  [108-120] 114 (08/09 0309) Resp:  [15-24] 17 (08/09 0309) BP: (86-104)/(50-65) 101/65 (08/09 0309) SpO2:  [89 %-99 %] 93 % (08/09 0309)     Height: '5\' 3"'$  (160 cm) Weight: 101.6 kg BMI (Calculated): 39.69   Intake/Output last 2 shifts:  08/08 0701 - 08/09 0700 In: 1629.1 [P.O.:120; I.V.:1111.7; IV Piggyback:397.4] Out: 265 [Drains:265]   Physical Exam:  Constitutional: alert, cooperative and no distress  Respiratory: breathing non-labored at rest  Cardiovascular: regular rate and sinus rhythm  Gastrointestinal: Soft, expected tenderness, non-distended, no rebound/guarding. Surgical drain in LLQ; output serous: IR drains in RLQ and LLQ output is thicker serous fluid, no gross purulence. 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; dusky, minimal bowel sweat present Integumentary: Midline wound healing via secondary intention, 27 x 6 x 6 cm; wound vac present; good seal   Labs:     Latest Ref Rng & Units 04/03/2022    5:25 AM 04/02/2022    4:36 AM 04/01/2022    4:35 AM  CBC  WBC 4.0 - 10.5 K/uL 13.7  19.0  19.6   Hemoglobin 12.0 - 15.0 g/dL 8.0  8.5  8.9   Hematocrit 36.0 - 46.0 % 25.9  27.1  28.5   Platelets 150 -  400 K/uL 392  377  319       Latest Ref Rng & Units 04/01/2022    4:35 AM 03/31/2022    6:11 AM 03/30/2022    7:40 AM  CMP  Glucose 70 - 99 mg/dL 106  105  127   BUN 6 - 20 mg/dL '10  11  11   '$ Creatinine 0.44 - 1.00 mg/dL 0.52  0.46  0.48   Sodium 135 - 145 mmol/L 140  139  141   Potassium 3.5 - 5.1 mmol/L 4.4  4.3  3.9   Chloride 98 - 111 mmol/L 107  106  106   CO2 22 - 32 mmol/L '24  26  26   '$ Calcium 8.9 - 10.3 mg/dL 8.2  7.9  8.3   Total Protein 6.5 - 8.1 g/dL 5.9     Total Bilirubin 0.3 - 1.2 mg/dL 2.3     Alkaline Phos 38 - 126 U/L 76     AST 15 - 41 U/L 33     ALT 0 - 44 U/L 30        Imaging studies: No new imaging studies    Assessment/Plan:  36 y.o. female awaiting ROBF, 7 Days Post-Op s/p exploratory laparotomy and Hartman's procedure for perforated sigmoid colon.   - Will trial FLD + nutritional supplementation: If hagving issues with nausea, distension, pain, or more frequent belching I encouraged  her to back down to CLD - Continue TPN; monitor electrolytes - Continue IV Abx (meropenem); new Cx pending; ID on board   - Monitor colostomy function; this will likely get dusky; WOC RN following  - Wound Care: Wound Vac MWF; WOC RN assisting  - Continue surgical drain; monitor and record output   - Continue IR drains; monitor and record output; flush  - Monitor abdominal examination - Pain control scheduled + prn; antiemetics prn - Monitor leukocytosis; improved  - Monitor H&H; morning labs - Mobilizing with therapies; recommendations are CIR - Further management per primary service  All of the above findings and recommendations were discussed with the patient, patient's family (mother-in-law at bedside), and the medical team, and all of patient's and family's questions were answered to their expressed satisfaction.  -- [name], PA-C  [facility] 04/03/2022, 7:41 AM M-F: 7am - 4pm

## 2022-04-03 NOTE — Consult Note (Signed)
PHARMACY - TOTAL PARENTERAL NUTRITION CONSULT NOTE   Indication: Prolonged ileus  Patient Measurements: Height: '5\' 3"'$  (160 cm) Weight: 101.6 kg (223 lb 15.8 oz) IBW/kg (Calculated) : 52.4 TPN AdjBW (KG): 62.1 Body mass index is 39.68 kg/m.  Assessment:  Patient is [name] with medical history including recent laparoscopic adhesiolysis, left salpingectomy, drainage of peritoneal cyst, and chromotubation on 7/24 who presented to the ED 7/27 with severe pain in abdomen and vagina. Imaging concerning for post-operative abscess. Patient underwent CT drain placement into pelvic abscess on 7/28. Hospital course has been complicated by ileus. Pharmacy consulted to initiate TPN to provide nutrition given post-operative ileus.   Glucose / Insulin: BG 134 requiring 6 units insulin aspart SSI in the past 24 hours.  Insulin Glargine 10u HS added 8/3 Electrolytes: WNL Renal: Scr < 1 Hepatic: LFTs remains WNL, Hyperbilirubinemia resolving Intake / Output; MIVF: JP drain left abdomen, NGT decompression, colostomy LUQ. Drain with minimal output. I&O: + 18.2L GI Imaging: 7/31 CT abdomen / pelvis: Percutaneous drainage catheter is seen in left-sided abdominal abscess. Mild proximal small bowel dilatation is noted which is slightly improved compared to prior exam, most consistent with ileus. Stable mild ascites is noted in the pelvis and right side of the abdomen.  GI Surgeries / Procedures:  7/28: CT drain placement into pelvic abscess 8/2: Pending ExLap with general surgery  Central access: PICC Double Lumen placed 8/1 TPN start date: 8/1   Nutritional Goals: Goal TPN rate is 75 mL/hr (provides 108 g of protein and 11-25-15 kcals per day)  RD Assessment: Estimated Needs Total Energy Estimated Needs: 2000-2300kcal/day Total Protein Estimated Needs: 100-115g/day Total Fluid Estimated Needs: 1.6-1.8L/day  Current Nutrition:  NPO  Plan:  Will decrease TPN to 62 ml/hr (less than goal rate) due  pt's weight increase 23lb and is positive 18 L. (This bag will provide 1996 kcl) Protein 101.18 g Dextrose 297.6 g Lipids 58 g.  Electrolytes in TPN adjusted to: Na 66mq/L, K 554m/L, Ca 44m38mL, Mg 44mE52m, and Phos 44mmo52m. Cl:Ac 1:1 Phos in TPN deceased on 87 duApr 01[birth year] 1987to hyperphosphatemia with normal renal function. Phos today WNL  Add standard MVI and trace elements to TPN Thiamine 100 mg x 3 days (completed) Continue Moderate q6h SSI and adjust as needed  Continue insulin glargine 10 units at bedtime Monitor TPN labs on Mon/Thurs  KishaEleonore ChiquitormD, BCPS 04/03/2022 10:27 AM

## 2022-04-03 NOTE — Progress Notes (Signed)
PROGRESS NOTE    Lacey Harrison   LPF:[id number] DOB: 04/19/1986  DOA: 03/21/2022 Date of Service: 04/03/22 PCP: Rubie Maid, MD     Brief Narrative / Hospital Course:  36 yo F presenting to [facility] ED from home on 03/21/22 with complaints of sudden onset severe abdominal pain. She recently on 03/18/22 had robotic adhesiolysis, left salpingectomy, drainage of 500 mL from large LEFT peritoneal cyst & chromotubation. She was discharged home on 5/32 without complication. She reported being on the floor with her cat and twisting her body around, with sudden acute abdominal pain. Patient complained of severe pain in lower abdomen radiating towards her vagina.  07/27: CT imaging abscess. Leukocytosis with left shift at 16.8. Initiated on IV Zosyn and Doxycycline. Admitted to OBGYN.  07/28: IR procedure 07/28 CT drain placement into pelvic abscess, left anterior approach, 12 Fr to bulb suction. Patient developed severe sepsis/septic shock.  She required transfer to stepdown unit, due to concerns for respiratory status PCCM consulted, receiving BS abx w/ zosyn, doxycycline & vancomycin, volume resuscitation w/ IVF, awaiting culture results   07/29: Remains in SDU w/ ileus and sepsis. IV antibiotics. Repeat CT abdomen pelvis , new loculations on the right, anasarca, distended urinary bladder. No contrast extravasation to suggest bowel perforation 07/30: Continues to have abdominal pain, required NG, abdomen less distended.  07/31: repeat CT The abscess is slightly decreased compared to prior exam. Continuing on Unasyn and Diflucan.  08/01: VSS, WBC trending down, BCx NGx4d. Ileus no better. Drain in place, minimal output. NG in place. PICC and TPN initiated. Spoke w/ GYN, plan for surgical consult and ID consult.  8/02: Patient continued to have persistently elevated white cell count with abdominal pain.  General surgery has scheduled exploratory laparotomy. 8/03: She  underwent Exploratory laparotomy,  sigmoid colectomy with colostomy creation, Hartmann procedure, lysis of additions.  Continue TPN and IV antibiotics. 08/04: Leukocytosis persist could be due to stress response.  Remains afebrile, continue NPO. and TPN, wound care consult, colostomy education. 08/05: NG tube removed, Foley removed.  Patient started on clear liquid diet, advance as tolerated. 08/07: WBC remains elevated, continues to have pain.  General surgery ordered CT abdomen and pelvis to rule out intra-abdominal process. 08/08: Repeat CT scan abdomen shows persistent pockets of fluid. IR performed CT-guided placement of drain x 2 into RLQ, LLQ/pelvis. 08/09: WBC improving, tolerating CLD trial FLD today. Continue IV Abx (meropenem); new Cx pending; ID on board    Consultants:  GYN PCCM IR General Surgery Infectious Disease   Procedures: 07/28 CT drain placement into pelvic abscess 07/30 NG tube placed  07/31 PICC line. 08/03 Exploratory laparotomy, sigmoid colectomy with colostomy creation, Hartmann procedure, lysis of additions 08/08: Repeat CT scan abdomen shows persistent pockets of fluid. IR performed CT-guided placement of drain x 2 into RLQ, LLQ/pelvis.    Subjective: Patient reports overall doing well this morning, has not had any full liquid diet yet, pain is controlled.     ASSESSMENT & PLAN:   Principal Problem:   Postoperative intra-abdominal abscess Active Problems:   Acute respiratory failure with hypoxia (HCC) in seting of MSKpain, atelectasis, severe sepsis    Severe sepsis (HCC) without septic shock, d/t postoperative abscess, s/p pelvic drain placement    Ileus, postoperative (HCC)   AKI (acute kidney injury) ([place]) in setting of sepsis, IV contrast, NAGMA - RESOLVED    Abdominal pain   INFECTIOUS DISEASE Severe sepsis ([place]) without septic shock - RESOLVED Postoperative intraabdominal abscess/peritonitis -  IMPROVING/STABLE 36 year old female with recent robotic lap for adhesiolysis  and left salpingectomy and drainage of the left hydrosalpinx peritoneal cyst which was complicated by pelvic abscess secondary to sigmoid perforation. Initially underwent pelvic drain placement by IR.  Multiple organisms of colon origin and culture.  E. coli, Bacteroides, Clostridium perfringens and yeast. Patient underwent exploratory laparotomy and sigmoid colon resection with colostomy. Fecal peritonitis was present, she has been on Unasyn and fluconazole, changed to meropenem.  IR placed 2 additional drains 04/02/22.  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/abscess/perforation 03/28/22 UNDERWENT Exploratory laparotomy, sigmoid colectomy with colostomy creation, Hartmann procedure, lysis of additions Advancing from clear liquid to full liquid diet Continue TPN General surgery following Bowel regimen PPI Pain control   RESPIRATORY Acute respiratory failure with hypoxia (HCC) in seting of MSKpain, atelectasis, severe sepsis - RESOLVED O2 prn  RENAL AKI (acute kidney injury) ([place]) in setting of sepsis, IV contrast, NAGMA - RESOLVED Serial BMP, Mg, Phos  CARDIOVASCULAR Stable  GENITOURINARY & REPRODUCTIVE Stable   NEUROLOGICAL & PSYCHIATRIC Stable Ambien prn   ENDOCRINE SSI  MUSCULOSKELETAL/RHEUM Stable Eval for rehab is underway  DVT prophylaxis: lovenox Code Status: FULL Family Communication: family at bedside on rounds  Disposition Plan / TOC needs: inpatient for now Barriers to discharge / significant pending items: pending culture results, inpatient rehab evaluation.              Objective: Vitals:   04/02/22 2017 04/02/22 2349 04/03/22 0309 04/03/22 0833  BP: (!) 104/59 96/62 101/65 112/63  Pulse: (!) 117 (!) 109 (!) 114 (!) 116  Resp: '18 15 17 16  '$ Temp: 98.9 F (37.2 C) 99.1 F (37.3 C) 98.5 F (36.9 C) 100.3 F (37.9  C)  TempSrc: Oral  Oral Oral  SpO2: 98% 97% 93% (!) 89%  Weight:      Height:        Intake/Output Summary (Last 24 hours) at 04/03/2022 0850 Last data filed at 04/03/2022 0534 Gross per 24 hour  Intake 1629.13 ml  Output 265 ml  Net 1364.13 ml   Filed Weights   03/31/22 0500 04/01/22 0429 04/02/22 0500  Weight: 104.4 kg 102.9 kg 101.6 kg    Examination:  Constitutional:  VS as above General Appearance: alert, well-developed, well-nourished, NAD Eyes: Normal lids and conjunctive, non-icteric sclera Ears, Nose, Mouth, Throat: Normal external appearance MMM Neck: No masses, trachea midline Respiratory: Normal respiratory effort Cardiovascular: S1/S2 normal No lower extremity edema Musculoskeletal:  No clubbing/cyanosis of digits Symmetrical movement in all extremities Neurological: No cranial nerve deficit on limited exam Alert Psychiatric: Normal judgment/insight Normal mood and affect       Scheduled Medications:   Chlorhexidine Gluconate Cloth  6 each Topical Daily   cyclobenzaprine  7.5 mg Oral TID   enoxaparin (LOVENOX) injection  0.5 mg/kg Subcutaneous Q24H   feeding supplement  237 mL Oral BID BM   insulin aspart  0-15 Units Subcutaneous Q6H   insulin glargine-yfgn  10 Units Subcutaneous QHS   pantoprazole  40 mg Oral QHS   pregabalin  100 mg Oral TID   sodium chloride flush  10-40 mL Intracatheter Q12H   sodium chloride flush  5 mL Intracatheter Q8H    Continuous Infusions:  sodium chloride 10 mL/hr at 04/02/22 0647   fluconazole (DIFLUCAN) IV Stopped (04/02/22 1818)   meropenem (MERREM) IV 1 g (04/03/22 0511)   TPN ADULT (ION)  75 mL/hr at 04/02/22 2353    PRN Medications:  sodium chloride, albuterol, alum & mag hydroxide-simeth, bisacodyl, diazepam, diphenhydrAMINE, HYDROmorphone (DILAUDID) injection, ibuprofen, iohexol, ondansetron **OR** ondansetron (ZOFRAN) IV, mouth rinse, oxyCODONE, simethicone, sodium chloride flush,  zolpidem  Antimicrobials:  Anti-infectives (From admission, onward)    Start     Dose/Rate Route Frequency Ordered Stop   04/01/22 1500  meropenem (MERREM) 1 g in sodium chloride 0.9 % 100 mL IVPB        1 g 200 mL/hr over 30 Minutes Intravenous Every 8 hours 04/01/22 1404     03/25/22 1600  Ampicillin-Sulbactam (UNASYN) 3 g in sodium chloride 0.9 % 100 mL IVPB  Status:  Discontinued        3 g 200 mL/hr over 30 Minutes Intravenous Every 6 hours 03/25/22 1134 04/01/22 1354   03/24/22 2200  clindamycin (CLEOCIN) IVPB 900 mg  Status:  Discontinued        900 mg 100 mL/hr over 30 Minutes Intravenous Every 8 hours 03/24/22 1854 03/25/22 1134   03/23/22 1500  fluconazole (DIFLUCAN) IVPB 400 mg        400 mg 100 mL/hr over 120 Minutes Intravenous Every 24 hours 03/23/22 1358     03/22/22 1851  vancomycin variable dose per unstable renal function (pharmacist dosing)  Status:  Discontinued         Does not apply See admin instructions 03/22/22 1851 03/23/22 1051   03/22/22 1645  vancomycin (VANCOCIN) IVPB 1000 mg/200 mL premix  Status:  Discontinued        1,000 mg 200 mL/hr over 60 Minutes Intravenous  Once 03/22/22 1553 03/22/22 1558   03/22/22 1645  vancomycin (VANCOREADY) IVPB 2000 mg/400 mL        2,000 mg 200 mL/hr over 120 Minutes Intravenous  Once 03/22/22 1558 03/22/22 1814   03/22/22 0400  piperacillin-tazobactam (ZOSYN) IVPB 3.375 g  Status:  Discontinued        3.375 g 12.5 mL/hr over 240 Minutes Intravenous Every 8 hours 03/21/22 2047 03/25/22 1134   03/21/22 2200  doxycycline (VIBRAMYCIN) 100 mg in sodium chloride 0.9 % 250 mL IVPB  Status:  Discontinued        100 mg 125 mL/hr over 120 Minutes Intravenous Every 12 hours 03/21/22 2047 03/23/22 1358   03/21/22 1900  piperacillin-tazobactam (ZOSYN) IVPB 3.375 g        3.375 g 12.5 mL/hr over 240 Minutes Intravenous STAT 03/21/22 1853 03/21/22 2300       Data Reviewed: I have personally reviewed following labs and imaging  studies  CBC: Recent Labs  Lab 03/30/22 0740 03/31/22 0611 04/01/22 0435 04/02/22 0436 04/03/22 0525  WBC 17.0* 18.0* 19.6* 19.0* 13.7*  HGB 10.1* 8.8* 8.9* 8.5* 8.0*  HCT 32.3* 28.3* 28.5* 27.1* 25.9*  MCV 91.0 91.3 90.2 91.6 92.2  PLT 239 275 319 377 256   Basic Metabolic Panel: Recent Labs  Lab 03/28/22 0512 03/29/22 0422 03/30/22 0740 03/31/22 0611 04/01/22 0435 04/02/22 0436  NA 141 143 141 139 140  --   K 3.7 3.3* 3.9 4.3 4.4  --   CL 109 112* 106 106 107  --   CO2 '26 28 26 26 24  '$ --   GLUCOSE 221* 148* 127* 105* 106*  --   BUN '10 9 11 11 10  '$ --   CREATININE 0.50 0.41* 0.48 0.46 0.52  --   CALCIUM 7.3* 7.5* 8.3* 7.9* 8.2*  --   MG 2.0 1.7  --  2.0 2.1  --   PHOS 2.7 3.4  --   --  5.2* 4.5   GFR: Estimated Creatinine Clearance: 110.7 mL/min (by C-G formula based on SCr of 0.52 mg/dL). Liver Function Tests: Recent Labs  Lab 03/28/22 0512 03/29/22 0422 04/01/22 0435  AST 31 32 33  ALT '19 18 30  '$ ALKPHOS 44 48 76  BILITOT 2.8* 2.1* 2.3*  PROT 4.4* 4.2* 5.9*  ALBUMIN 2.1* 1.8* 2.0*   No results for input(s): "LIPASE", "AMYLASE" in the last 168 hours. No results for input(s): "AMMONIA" in the last 168 hours. Coagulation Profile: Recent Labs  Lab 04/02/22 0436  INR 1.0   Cardiac Enzymes: No results for input(s): "CKTOTAL", "CKMB", "CKMBINDEX", "TROPONINI" in the last 168 hours. BNP (last 3 results) No results for input(s): "PROBNP" in the last 8760 hours. HbA1C: No results for input(s): "HGBA1C" in the last 72 hours. CBG: Recent Labs  Lab 04/02/22 0537 04/02/22 1240 04/02/22 1744 04/02/22 2352 04/03/22 0528  GLUCAP 151* 97 137* 146* 134*   Lipid Profile: Recent Labs    04/01/22 0435  TRIG 318*   Thyroid Function Tests: No results for input(s): "TSH", "T4TOTAL", "FREET4", "T3FREE", "THYROIDAB" in the last 72 hours. Anemia Panel: No results for input(s): "VITAMINB12", "FOLATE", "FERRITIN", "TIBC", "IRON", "RETICCTPCT" in the last 72  hours. Urine analysis:    Component Value Date/Time   COLORURINE RED (A) 03/24/2022 1802   APPEARANCEUR TURBID (A) 03/24/2022 1802   LABSPEC 1.045 (H) 03/24/2022 1802   PHURINE  03/24/2022 1802    TEST NOT REPORTED DUE TO COLOR INTERFERENCE OF URINE PIGMENT   GLUCOSEU (A) 03/24/2022 1802    TEST NOT REPORTED DUE TO COLOR INTERFERENCE OF URINE PIGMENT   HGBUR NEGATIVE 03/24/2022 1802   BILIRUBINUR (A) 03/24/2022 1802    TEST NOT REPORTED DUE TO COLOR INTERFERENCE OF URINE PIGMENT   KETONESUR (A) 03/24/2022 1802    TEST NOT REPORTED DUE TO COLOR INTERFERENCE OF URINE PIGMENT   PROTEINUR (A) 03/24/2022 1802    TEST NOT REPORTED DUE TO COLOR INTERFERENCE OF URINE PIGMENT   NITRITE (A) 03/24/2022 1802    TEST NOT REPORTED DUE TO COLOR INTERFERENCE OF URINE PIGMENT   LEUKOCYTESUR (A) 03/24/2022 1802    TEST NOT REPORTED DUE TO COLOR INTERFERENCE OF URINE PIGMENT   Sepsis Labs: '@LABRCNTIP'$ (procalcitonin:4,lacticidven:4)  Recent Results (from the past 240 hour(s))  Urine Culture     Status: None   Collection Time: 03/24/22  6:02 PM   Specimen: Urine, Catheterized  Result Value Ref Range Status   Specimen Description   Final    URINE, CATHETERIZED Performed at [facility], [address], [place], [place] [address]    Special Requests   Final    Normal Performed at [facility], [address] [place], [place] [address]    Culture   Final    NO GROWTH Performed at [facility], [address] [place], [place] [address]    Report Status 03/25/2022 FINAL  Final  Aerobic/Anaerobic Culture w Gram Stain (surgical/deep wound)     Status: None (Preliminary result)   Collection Time: 04/02/22  1:00 PM   Specimen: Abscess  Result Value Ref Range Status   Specimen Description ABSCESS  Final   Special Requests RLQ ABDOMINAL ABSCESS  Final   Gram Stain   Final    ABUNDANT WBC PRESENT,BOTH PMN AND MONONUCLEAR NO ORGANISMS SEEN Performed at [facility], [place] [address], [place], [place] [address]  Culture PENDING  Incomplete   Report Status PENDING  Incomplete  Aerobic/Anaerobic Culture w Gram Stain (surgical/deep wound)     Status: None (Preliminary result)   Collection Time: 04/02/22  1:01 PM   Specimen: Abscess  Result Value Ref Range Status   Specimen Description ABSCESS  Final   Special Requests LLQ ABDOMINAL ABSCESS  Final   Gram Stain   Final    ABUNDANT WBC PRESENT,BOTH PMN AND MONONUCLEAR NO ORGANISMS SEEN Performed at [facility], [address] [place], [place] [address]    Culture PENDING  Incomplete   Report Status PENDING  Incomplete         Radiology Studies: DG Chest 2 View  Result Date: 03/22/2022 CLINICAL DATA:  Shortness of breath. EXAM: CHEST - 2 VIEW COMPARISON:  Two-view chest x-ray 07/26/2016.  CT chest 03/21/2022 FINDINGS: Low lung volumes exaggerate the heart size. Left lower lobe airspace opacities are again noted. No other significant airspace disease is present. IMPRESSION: Persistent left lower lobe airspace disease concerning for pneumonia. Electronically Signed   By: San Morelle M.D.   On: 03/22/2022 21:46   CT IMAGE GUIDED DRAINAGE PERCUT CATH  PERITONEAL RETROPERIT  Result Date: 03/22/2022 INDICATION: Abdominopelvic abscess EXAM: CT-guided placement of drainage catheter into abdominopelvic abscess TECHNIQUE: Multidetector CT imaging of the abdomen and pelvis was performed following the standard protocol without IV contrast. RADIATION DOSE REDUCTION: This exam was performed according to the departmental dose-optimization program which includes automated exposure control, adjustment of the mA and/or kV according to patient size and/or use of iterative reconstruction technique. MEDICATIONS: Per EMR ANESTHESIA/SEDATION: Local analgesia COMPLICATIONS: None immediate. PROCEDURE: Informed written consent was obtained from the patient after a thorough discussion of the procedural  risks, benefits and alternatives. All questions were addressed. Maximal Sterile Barrier Technique was utilized including caps, mask, sterile gowns, sterile gloves, sterile drape, hand hygiene and skin antiseptic. A timeout was performed prior to the initiation of the procedure. The patient was placed supine on the exam table. Limited CT of the abdomen and pelvis was performed for planning purposes. This again demonstrated an air in fluid collection in the lower abdomen and pelvis. Skin entry site was marked with a planned anterolateral approach from the left lower quadrant. The overlying skin was prepped and draped in a standard sterile fashion. Local analgesia was obtained with 1% lidocaine. Using intermittent CT fluoroscopy, an 18 gauge trocar needle was advanced towards the identified abdominopelvic fluid collection. Access was confirmed with CT and return of purulent material. An 035 wire was then advanced through the access needle, over which the percutaneous tract was serially dilated to accommodate a 12 [place] multipurpose locking drainage catheter. Location was again confirmed with CT and return of additional purulent material. Catheter was secured to the skin using silk suture and a dressing. It was attached to bulb suction. The patient tolerated the procedure well without immediate complication. IMPRESSION: Successful CT-guided placement of a 12 French locking drainage catheter into the lower abdominopelvic abscess. Sample sent to the lab for microbiology analysis. Electronically Signed   By: Albin Felling M.D.   On: 03/22/2022 15:12   CT ABDOMEN PELVIS W CONTRAST  Result Date: 03/21/2022 CLINICAL DATA:  Abdominal pain, post-op Abdominal pain, acute, nonlocalized. Robotic salpingectomy 03/18/2022 EXAM: CT ABDOMEN AND PELVIS WITH CONTRAST TECHNIQUE: Multidetector CT imaging of the abdomen and pelvis was performed using the standard protocol following bolus administration of intravenous contrast.  RADIATION DOSE REDUCTION: This exam was performed according to the  departmental dose-optimization program which includes automated exposure control, adjustment of the mA and/or kV according to patient size and/or use of iterative reconstruction technique. CONTRAST:  111m OMNIPAQUE IOHEXOL 300 MG/ML  SOLN COMPARISON:  None Available. FINDINGS: Lower chest: Left base atelectasis.  No acute abnormality. Hepatobiliary: Liver is enlarged measuring up to 19 cm. The hepatic parenchyma is diffusely hypodense compared to the splenic parenchyma consistent with fatty infiltration. No focal liver abnormality. No gallstones, gallbladder wall thickening, or pericholecystic fluid. No biliary dilatation. Pancreas: No focal lesion. Normal pancreatic contour. No surrounding inflammatory changes. No main pancreatic ductal dilatation. Spleen: Normal in size without focal abnormality. Adrenals/Urinary Tract: No adrenal nodule bilaterally. Bilateral kidneys enhance symmetrically. No hydronephrosis. No hydroureter. The urinary bladder is unremarkable. On delayed imaging, there is no urothelial wall thickening and there are no filling defects in the opacified portions of the bilateral collecting systems or ureters as well as urinary bladder. Stomach/Bowel: Stomach is within normal limits. No evidence of bowel wall thickening or dilatation. Appendix appears normal. Vascular/Lymphatic: No abdominal aorta or iliac aneurysm. No abdominal, pelvic, or inguinal lymphadenopathy. Reproductive: Uterus is unremarkable no adnexal mass identified. Other: No intraperitoneal free fluid. Small volume free intraperitoneal gas. There is a gas and fluid collection with peripheral enhancement measuring approximately 8 x 9 x 7.5 cm centered within the left rectouterine pouch. Musculoskeletal: No abdominal wall hernia or abnormality. No suspicious lytic or blastic osseous lesions. No acute displaced fracture. IMPRESSION: 1. An 8 x 9 x 7.5cm gas and fluid  collection centered within the left rectouterine pouch likely represents a developing abscess. No findings to suggest bowel injury; however, this is not fully excluded. If a CT is repeated in the near future, consider use of IV and PO contrast. 2. Small volume pneumoperitoneum likely postsurgical in etiology. 3. Hepatomegaly and hepatic steatosis. Electronically Signed   By: MIven FinnM.D.   On: 03/21/2022 18:43            LOS: 13 days      NEmeterio Reeve DO [facility] 04/03/2022, 8:50 AM   Staff may message me via secure chat in [employer] but this may not receive immediate response,  please page for urgent matters!  If 7PM-7AM, please contact night-coverage [email or web address]  Dictation software was used to generate the above note. Typos may occur and escape review, as with typed/written notes. Please contact Dr ASheppard Coildirectly for clarity if needed.

## 2022-04-03 NOTE — TOC Progression Note (Signed)
Transition of Care [facility]) - Progression Note    Patient Details  Name: Lacey Harrison MRN: [id number] Date of Birth: [birth year]  Transition of Care [facility]) CM/SW Contact  Beverly Sessions, RN Phone Number: 04/03/2022, 11:05 AM  Clinical Narrative:     Per Urban Gibson with CIR they can manage drains, wound vac, and IV antibiotics  if indicated.  Would need to be weaned off TPN prior to admission.  Per Thedore Mins with surgery TPN is anticipated for short term   Expected Discharge Plan:  (TBD) Barriers to Discharge: Continued Medical Work up  Expected Discharge Plan and Services Expected Discharge Plan:  (TBD)       Living arrangements for the past 2 months: Single Family Home                                       Social Determinants of Health (SDOH) Interventions    Readmission Risk Interventions     No data to display

## 2022-04-03 NOTE — Progress Notes (Signed)
PT Cancellation Note  Patient Details Name: Lacey Harrison MRN: [id number] DOB: 03/30/1986   Cancelled Treatment:     PT/ OT co-treat earlier in the day but non billable due to inability to tolerate enough activity. Author returned after 4pm as requested by patient,however pt in the middle of getting a bath. Will return in the morning to progress pt to PLOF a. See OT note from earlier for pt's abilities. Did ambulate to RN station and return with RW. Very pain limited.    Willette Pa 04/03/2022, 4:42 PM

## 2022-04-03 NOTE — Progress Notes (Addendum)
Occupational Therapy Treatment Patient Details Name: Lacey Harrison MRN: [id number] DOB: 21-Apr-1986 Today's Date: 04/03/2022   History of present illness Pt is a 36 y.o. female presenting to hospital post op day 3 from robotic assisted laparoscopic surgery (lysis of adhesions, left salpingectomy, drainage of large peritoneal cyst, chromotubation 7/24) c/o sudden onset of severe abdominal pain.  CT drain placement into pelvic abscess 7/28.  Pt developed severe sepsis/septic shock after drainage of abdominal pelvic abscess and transferred to ICU.  Pt also noted with acute hypoxic respiratory distress and AKI; developing ileus.  S/p 8/2 exploratory laparotomy, sigmoid colectomy with colostomy creation, and Hartman's procedure for perforated sigmoid colon.  PMH includes smoking, anxiety, lyme disease, migraines, PNA, chromopertubation, cystectomy L, infertility, previous surgery for L ovarian cystectomy L for dermoid cyst complicated by cystotomy and bladder repair in 2007, pelvic pain (underwent 02/16/18 lap and extensive abdominopelvic adhesiolysis).   OT comments  Chart reviewed, pt greeted in chair agreeable to OT tx session. Tx session targeted improving activity tolerance to facilitate increased ease of ADLs to return to PLOF. Progress noted with activity tolerance with pt able to amb ~40' with RW with CGA+2 from PTA for line/lead/drain management. 3 brief standing rest breaks required. MAX A continues to be required for LB dressing. Pt is left in bed, NAD, all needs met. Pt continues to perform ADL/functional mobility significantly below PLOF. Discharge recommendation remains appropriate. OT will follow acutely.    Recommendations for follow up therapy are one component of a multi-disciplinary discharge planning process, led by the attending physician.  Recommendations may be updated based on patient status, additional functional criteria and insurance authorization.    Follow Up Recommendations   Acute inpatient rehab (3hours/day)    Assistance Recommended at Discharge Frequent or constant Supervision/Assistance  Patient can return home with the following  A lot of help with walking and/or transfers;A lot of help with bathing/dressing/bathroom   Equipment Recommendations  BSC/3in1;Tub/shower seat    Recommendations for Other Services      Precautions / Restrictions Precautions Precautions: Fall Precaution Comments: JP drain L abdomen, NG tube, foley, picc line, long abdominal incision; L UQ colostomy, two new ab drains Restrictions Weight Bearing Restrictions: No       Mobility Bed Mobility Overal bed mobility: Needs Assistance Bed Mobility: Sit to Sidelying         Sit to sidelying: Min guard, HOB elevated (vcs for technique)      Transfers Overall transfer level: Needs assistance Equipment used: Rolling walker (2 wheels) Transfers: Sit to/from Stand Sit to Stand: Min guard, From elevated surface                 Balance Overall balance assessment: Needs assistance Sitting-balance support: Feet supported, Bilateral upper extremity supported Sitting balance-Leahy Scale: Fair     Standing balance support: During functional activity, Reliant on assistive device for balance, Bilateral upper extremity supported Standing balance-Leahy Scale: Fair                             ADL either performed or assessed with clinical judgement   ADL Overall ADL's : Needs assistance/impaired Eating/Feeding: NPO   Grooming: Wash/dry face;Sitting;Set up       Lower Body Bathing: Maximal assistance           Toilet Transfer: Min guard;Rolling walker (2 wheels) Toilet Transfer Details (indicate cue type and reason): simulated  Functional mobility during ADLs: Min guard;Rolling walker (2 wheels) (amb 40' with CGA +2, 3 short standing rest breaks required)      Extremity/Trunk Assessment              Vision       Perception      Praxis      Cognition Arousal/Alertness: Awake/alert Behavior During Therapy: WFL for tasks assessed/performed, Anxious Overall Cognitive Status: Within Functional Limits for tasks assessed                                          Exercises      Shoulder Instructions       General Comments all lines/leads/drains intact following intervention    Pertinent Vitals/ Pain       Pain Assessment Pain Assessment: 0-10 Pain Score: 6  Pain Location: abdomen Pain Descriptors / Indicators: Discomfort, Grimacing, Guarding, Shooting Pain Intervention(s): Limited activity within patient's tolerance, Monitored during session, Repositioned  Home Living                                          Prior Functioning/Environment              Frequency  Min 4X/week        Progress Toward Goals  OT Goals(current goals can now be found in the care plan section)  Progress towards OT goals: Progressing toward goals     Plan Discharge plan remains appropriate    Co-evaluation                 AM-PAC OT "6 Clicks" Daily Activity     Outcome Measure   Help from another person eating meals?: Total Help from another person taking care of personal grooming?: A Little Help from another person toileting, which includes using toliet, bedpan, or urinal?: A Lot Help from another person bathing (including washing, rinsing, drying)?: A Lot Help from another person to put on and taking off regular upper body clothing?: A Little Help from another person to put on and taking off regular lower body clothing?: A Lot 6 Click Score: 13    End of Session Equipment Utilized During Treatment: Rolling walker (2 wheels)  OT Visit Diagnosis: Muscle weakness (generalized) (M62.81);Unsteadiness on feet (R26.81)   Activity Tolerance Patient tolerated treatment well   Patient Left with family/visitor present;with bed alarm set;in bed   Nurse Communication  Mobility status        Time: 7543-6067 OT Time Calculation (min): 18 min  Charges: OT General Charges $OT Visit: 1 Visit OT Treatments $Therapeutic Activity: 8-22 mins  Shanon Payor, OTD OTR/L  04/03/22, 3:45 PM

## 2022-04-03 NOTE — Progress Notes (Signed)
Date of Admission:  03/21/2022    ID: Lacey Harrison is a 36 y.o. female  Principal Problem:   Postoperative intra-abdominal abscess Active Problems:   Acute respiratory failure with hypoxia (HCC) in seting of MSKpain, atelectasis, severe sepsis    Severe sepsis (HCC) without septic shock, d/t postoperative abscess, s/p pelvic drain placement    Ileus, postoperative (HCC)   AKI (acute kidney injury) ([place]) in setting of sepsis, IV contrast, NAGMA - RESOLVED    Abdominal pain    Subjective: Pt was sleeping Spoke to Mother in law at bed side Medications:   Chlorhexidine Gluconate Cloth  6 each Topical Daily   cyclobenzaprine  7.5 mg Oral TID   enoxaparin (LOVENOX) injection  0.5 mg/kg Subcutaneous Q24H   feeding supplement  237 mL Oral BID BM   insulin aspart  0-15 Units Subcutaneous Q6H   insulin glargine-yfgn  10 Units Subcutaneous QHS   pantoprazole  40 mg Oral QHS   pregabalin  100 mg Oral TID   sodium chloride flush  10-40 mL Intracatheter Q12H   sodium chloride flush  5 mL Intracatheter Q8H    Objective: Vital signs in last 24 hours: Temp:  [98.5 F (36.9 C)-100.3 F (37.9 C)] 100.3 F (37.9 C) (08/09 0833) Pulse Rate:  [109-118] 116 (08/09 0833) Resp:  [15-24] 16 (08/09 0833) BP: (93-112)/(52-65) 112/63 (08/09 0833) SpO2:  [89 %-99 %] 94 % (08/09 0900)  LDA Rt PICc   Lab Results Recent Labs    04/01/22 0435 04/02/22 0436 04/03/22 0525  WBC 19.6* 19.0* 13.7*  HGB 8.9* 8.5* 8.0*  HCT 28.5* 27.1* 25.9*  NA 140  --   --   K 4.4  --   --   CL 107  --   --   CO2 24  --   --   BUN 10  --   --   CREATININE 0.52  --   --    Liver Panel Recent Labs    04/01/22 0435  PROT 5.9*  ALBUMIN 2.0*  AST 33  ALT 30  ALKPHOS 76  BILITOT 2.3*    Microbiology:  ABUNDANT WBC PRESENT,BOTH PMN AND MONONUCLEAR  ABUNDANT GRAM NEGATIVE RODS  FEW GRAM POSITIVE RODS  FEW GRAM POSITIVE COCCI IN PAIRS  FEW BUDDING YEAST SEEN   Culture ABUNDANT ESCHERICHIA COLI   MODERATE CLOSTRIDIUM PERFRINGENS  Standardized susceptibility testing for this organism is not available.  ABUNDANT BACTEROIDES VULGATUS  BETA LACTAMASE NEGATIVE    Studies/Results: CT GUIDED PERITONEAL/RETROPERITONEAL FLUID DRAIN BY PERC CATH  Result Date: 04/02/2022 INDICATION: Intra-abdominal collections EXAM: 1. CT-guided placement of abdominal drainage catheter into the right lower quadrant collection 2. CT-guided placement of abdominal drainage catheter into left lower quadrant/pelvic collection TECHNIQUE: Multidetector CT imaging of the abdomen and pelvis was performed following the standard protocol without IV contrast. RADIATION DOSE REDUCTION: This exam was performed according to the departmental dose-optimization program which includes automated exposure control, adjustment of the mA and/or kV according to patient size and/or use of iterative reconstruction technique. MEDICATIONS: The patient is currently admitted to the hospital and receiving intravenous antibiotics. The antibiotics were administered within an appropriate time frame prior to the initiation of the procedure. ANESTHESIA/SEDATION: Moderate (conscious) sedation was employed during this procedure. A total of Versed 3 mg and Fentanyl 100 mcg was administered intravenously by the radiology nurse. Total intra-service moderate Sedation Time: 50 minutes. The patient's level of consciousness and vital signs were monitored continuously by radiology nursing throughout the  procedure under my direct supervision. COMPLICATIONS: None immediate. PROCEDURE: Informed written consent was obtained from the patient after a thorough discussion of the procedural risks, benefits and alternatives. All questions were addressed. Maximal Sterile Barrier Technique was utilized including caps, mask, sterile gowns, sterile gloves, sterile drape, hand hygiene and skin antiseptic. A timeout was performed prior to the initiation of the procedure. The patient was  placed supine on the exam table. Limited CT of the abdomen and pelvis was performed for planning purposes. This again demonstrated multiple fluid collections within the abdomen and pelvis. Attention was first turned to the right abdomen. Skin entry site was marked, and the overlying skin was prepped and draped in a standard sterile fashion. Local analgesia was obtained with 1% lidocaine. Using intermittent CT fluoroscopy, a 19 gauge Yueh needle was advanced towards the right infrahepatic fluid collection. Location was confirmed with CT and return of thick serous material. An 035 wire was advanced through the access needle. Over this wire, the percutaneous tract was serially dilated accommodate a 12 [place] multipurpose locking drainage catheter. Location was again confirmed with CT and return of additional thick serous material. A sample was sent to the lab for analysis. The drainage catheter was secured to the skin using silk suture and a dressing. It was attached to bag drainage. Attention was then turned to the left side. Skin entry site was marked, and the overlying skin was prepped and draped in the standard sterile fashion. Local analgesia was obtained with 1% lidocaine. Using intermittent CT fluoroscopy, a 19 gauge Yueh needle was advanced towards the left lower quadrant/pelvic collection. Location was confirmed with CT and return of thick serous material. An 035 wire was advanced through the access needle. Over this wire, the percutaneous tract was serially dilated accommodate a 12 [place] multipurpose locking drainage catheter. Location was again confirmed with CT and return of additional thick serous material. A sample was sent to the lab for analysis. The drainage catheter was secured to the skin using silk suture and a dressing. It was attached to bulb suction. Patient tolerated all aspects of the procedure well without immediate complication. IMPRESSION: 1. Successful CT-guided placement of a 12 French  locking drainage catheter into the right infrahepatic fluid collection. Thick serous fluid was returned. A sample was sent to the lab for microbiology analysis. 2. Successful CT-guided placement of a 12 French locking drainage catheter into the left lower quadrant/pelvic fluid collection. Thick serous fluid was returned. A sample was sent to the lab for microbiology analysis. Electronically Signed   By: Albin Felling M.D.   On: 04/02/2022 14:21   CT GUIDED PERITONEAL/RETROPERITONEAL FLUID DRAIN BY PERC CATH  Result Date: 04/02/2022 INDICATION: Intra-abdominal collections EXAM: 1. CT-guided placement of abdominal drainage catheter into the right lower quadrant collection 2. CT-guided placement of abdominal drainage catheter into left lower quadrant/pelvic collection TECHNIQUE: Multidetector CT imaging of the abdomen and pelvis was performed following the standard protocol without IV contrast. RADIATION DOSE REDUCTION: This exam was performed according to the departmental dose-optimization program which includes automated exposure control, adjustment of the mA and/or kV according to patient size and/or use of iterative reconstruction technique. MEDICATIONS: The patient is currently admitted to the hospital and receiving intravenous antibiotics. The antibiotics were administered within an appropriate time frame prior to the initiation of the procedure. ANESTHESIA/SEDATION: Moderate (conscious) sedation was employed during this procedure. A total of Versed 3 mg and Fentanyl 100 mcg was administered intravenously by the radiology nurse. Total intra-service moderate Sedation  Time: 50 minutes. The patient's level of consciousness and vital signs were monitored continuously by radiology nursing throughout the procedure under my direct supervision. COMPLICATIONS: None immediate. PROCEDURE: Informed written consent was obtained from the patient after a thorough discussion of the procedural risks, benefits and  alternatives. All questions were addressed. Maximal Sterile Barrier Technique was utilized including caps, mask, sterile gowns, sterile gloves, sterile drape, hand hygiene and skin antiseptic. A timeout was performed prior to the initiation of the procedure. The patient was placed supine on the exam table. Limited CT of the abdomen and pelvis was performed for planning purposes. This again demonstrated multiple fluid collections within the abdomen and pelvis. Attention was first turned to the right abdomen. Skin entry site was marked, and the overlying skin was prepped and draped in a standard sterile fashion. Local analgesia was obtained with 1% lidocaine. Using intermittent CT fluoroscopy, a 19 gauge Yueh needle was advanced towards the right infrahepatic fluid collection. Location was confirmed with CT and return of thick serous material. An 035 wire was advanced through the access needle. Over this wire, the percutaneous tract was serially dilated accommodate a 12 [place] multipurpose locking drainage catheter. Location was again confirmed with CT and return of additional thick serous material. A sample was sent to the lab for analysis. The drainage catheter was secured to the skin using silk suture and a dressing. It was attached to bag drainage. Attention was then turned to the left side. Skin entry site was marked, and the overlying skin was prepped and draped in the standard sterile fashion. Local analgesia was obtained with 1% lidocaine. Using intermittent CT fluoroscopy, a 19 gauge Yueh needle was advanced towards the left lower quadrant/pelvic collection. Location was confirmed with CT and return of thick serous material. An 035 wire was advanced through the access needle. Over this wire, the percutaneous tract was serially dilated accommodate a 12 [place] multipurpose locking drainage catheter. Location was again confirmed with CT and return of additional thick serous material. A sample was sent to the lab  for analysis. The drainage catheter was secured to the skin using silk suture and a dressing. It was attached to bulb suction. Patient tolerated all aspects of the procedure well without immediate complication. IMPRESSION: 1. Successful CT-guided placement of a 12 French locking drainage catheter into the right infrahepatic fluid collection. Thick serous fluid was returned. A sample was sent to the lab for microbiology analysis. 2. Successful CT-guided placement of a 12 French locking drainage catheter into the left lower quadrant/pelvic fluid collection. Thick serous fluid was returned. A sample was sent to the lab for microbiology analysis. Electronically Signed   By: Albin Felling M.D.   On: 04/02/2022 14:21   CT ABDOMEN PELVIS W CONTRAST  Result Date: 04/01/2022 CLINICAL DATA:  Five days status post exploratory laparotomy and Hartmann procedure for perforated sigmoid colon. Abdominal pain. Low-grade fever. Increasing leukocytosis. Awaiting return of bowel function. EXAM: CT ABDOMEN AND PELVIS WITH CONTRAST TECHNIQUE: Multidetector CT imaging of the abdomen and pelvis was performed using the standard protocol following bolus administration of intravenous contrast. RADIATION DOSE REDUCTION: This exam was performed according to the departmental dose-optimization program which includes automated exposure control, adjustment of the mA and/or kV according to patient size and/or use of iterative reconstruction technique. CONTRAST:  122m OMNIPAQUE IOHEXOL 300 MG/ML  SOLN COMPARISON:  KUB 03/29/2022 and 03/27/2022; barium enema study demonstrating sigmoid colon bowel injury/perforation 03/26/2022; CT abdomen pelvis 03/25/2022 FINDINGS: Lower chest: Moderate bandlike atelectasis with  air bronchograms within the bilateral posteromedial lower lobes, improved from 03/25/2022 prior CT. No pleural effusion. Heart size is at the upper limits of normal. Partial visualization of central venous catheter tip terminating at the  superior vena cava/right atrial junction seen to be a right upper extremity PICC line on 03/31/2022 AP chest. Hepatobiliary: There is diffuse decreased density seen throughout the liver again suggesting fatty infiltration. There is layering contrast within the gallbladder likely from vicarious excretion. No intrahepatic or extrahepatic biliary ductal dilatation is seen. Pancreas: Unremarkable. No pancreatic ductal dilatation or surrounding inflammatory changes. Spleen: The spleen measures 13.7 cm in craniocaudal dimension, borderline enlarged and unchanged from prior. Adrenals/Urinary Tract: Normal bilateral adrenals. The kidneys enhance uniformly are symmetric in size without hydronephrosis. No focal urinary bladder wall thickening is seen. There is mild nondependent air within the urinary bladder lumen. Stomach/Bowel: Oral contrast is seen as distal as the rectum. There is a left upper quadrant distal descending colostomy. This is new from prior. There now appears to be a blind-ending rectal pouch consistent with interval Hartmann procedure. There is mild-to-moderate wall thickening of the postsurgical proximal rectum. Oral contrast is seen as distal as small bowel within the mid to upper abdomen (axial series 2, image 34) with more distal small bowel not filling with oral contrast likely just due to the timing of contrast administration as no transition point or definitive location of small bowel obstruction is seen. Findings are compatible with postoperative ileus with air-fluid level seen throughout the small bowel and multifocal areas of mild-to-moderate wall thickening. The appendix normally fills with contrast without dilatation or inflammatory changes seen. Vascular/Lymphatic: No abdominal aortic aneurysm. No mesenteric, retroperitoneal, or pelvic lymphadenopathy. Reproductive: The uterus is present.  No gross adnexal abnormality. Other: A left pelvic approach drain is seen with tip curling within the right  lower quadrant just inferior to the cecum. Interval removal of the prior left approach pigtail drainage catheter previously terminating within the left hemipelvis. There is mild interval increase in moderate fluid within the pelvis again with thin peripheral wall enhancement. Punctate focus of nondependent gas is seen within this fluid (axial series 2, image 73), decreased from prior. There is also a small focus of nondependent air within a portion of the pelvic fluid that extends into the right hemipelvis (axial image 70). There is mild interval increase in a thin rim enhancing fluid collection inferior to the liver, measuring up to approximately 4.8 by 13.9 by 16 cm (transverse by AP by craniocaudal) compared to approximately 3.3 x 10.0 x 16.7 cm on 03/25/2022. This fluid again extends down to the pelvis and is now contiguous with the ascites within the pelvis. There is punctate gas within the anterior superior aspect of this fluid collection (axial images 41 through 44). Overall there is mild-to-moderate ascites. There is a punctate focus of free air bordering the mid transverse dimension of the anterior inferior liver (axial series 2, image 29), decreased from prior. Punctate anterior midline abdominal pneumoperitoneum (axial series 2, image 54). Musculoskeletal: There is a new midline lower abdominal upper pelvic abdominal soft tissue defect with likely a vacuum assisted drain in this location. Moderate anasarca. No acute skeletal abnormality. IMPRESSION: Compared to 03/25/2022: 1. Interval likely partial sigmoid resection with new descending colon distal colostomy and new Hartmann pouch. 2. Interval removal of the prior left approach pigtail drainage catheter previously terminating within the left hemipelvis and placement of a new left approach drain terminating within the right lower quadrant just inferior to  the cecum. Mild interval increase in fluid collections inferior to the liver extending into the right  and left hemipelvis with small foci of air which may be secondary to infected nature of this fluid. 3. Punctate pneumoperitoneum, mildly decreased from prior. 4. Mild air within the nondependent urinary bladder. Recommend clinical correlation for recent instrumentation that would explain this. Please note gas forming infectious organism can also cause urinary bladder air, however no urinary bladder wall thickening is seen to suggest cystitis. Electronically Signed   By: Yvonne Kendall M.D.   On: 04/01/2022 16:33     Assessment/Plan: 36 year old female with recent robotic lap for adhesiolysis and left salpingectomy and drainage of the left hydrosalpinx peritoneal cyst which was complicated by pelvic abscess secondary to sigmoid perforation Initially underwent pelvic drain placement by IR.  Multiple organisms of colon origin and culture.  E. coli, Bacteroides, Clostridium perfringens and yeast.  Patient underwent exploratory laparotomy and sigmoid colon resection with colostomy.Fecal peritonitis was present has been on Unasyn and fluconazole Because of worsening leucocytosis changed unasyn to meropenem  CT abdomen showed further collection s/p 2 more drain placement Leucocytosis improving  Fever has resolved Anemia  AKI has resolved  Septic shock has resolved  Hypoalbuminemia  Hyperbilirubinemia   Discussed the management  family at bed side

## 2022-04-04 DIAGNOSIS — N739 Female pelvic inflammatory disease, unspecified: Secondary | ICD-10-CM | POA: Diagnosis not present

## 2022-04-04 DIAGNOSIS — T8143XA Infection following a procedure, organ and space surgical site, initial encounter: Secondary | ICD-10-CM | POA: Diagnosis not present

## 2022-04-04 DIAGNOSIS — A419 Sepsis, unspecified organism: Secondary | ICD-10-CM | POA: Diagnosis not present

## 2022-04-04 DIAGNOSIS — N179 Acute kidney failure, unspecified: Secondary | ICD-10-CM | POA: Diagnosis not present

## 2022-04-04 LAB — CBC
HCT: 25.8 % — ABNORMAL LOW (ref 36.0–46.0)
Hemoglobin: 8 g/dL — ABNORMAL LOW (ref 12.0–15.0)
MCH: 28.3 pg (ref 26.0–34.0)
MCHC: 31 g/dL (ref 30.0–36.0)
MCV: 91.2 fL (ref 80.0–100.0)
Platelets: 437 10*3/uL — ABNORMAL HIGH (ref 150–400)
RBC: 2.83 MIL/uL — ABNORMAL LOW (ref 3.87–5.11)
RDW: 14.1 % (ref 11.5–15.5)
WBC: 11.8 10*3/uL — ABNORMAL HIGH (ref 4.0–10.5)
nRBC: 0 % (ref 0.0–0.2)

## 2022-04-04 LAB — COMPREHENSIVE METABOLIC PANEL
ALT: 27 U/L (ref 0–44)
AST: 27 U/L (ref 15–41)
Albumin: 2.3 g/dL — ABNORMAL LOW (ref 3.5–5.0)
Alkaline Phosphatase: 54 U/L (ref 38–126)
Anion gap: 8 (ref 5–15)
BUN: 9 mg/dL (ref 6–20)
CO2: 26 mmol/L (ref 22–32)
Calcium: 8.1 mg/dL — ABNORMAL LOW (ref 8.9–10.3)
Chloride: 102 mmol/L (ref 98–111)
Creatinine, Ser: 0.49 mg/dL (ref 0.44–1.00)
GFR, Estimated: >60 mL/min (ref >60)
Glucose, Bld: 118 mg/dL — ABNORMAL HIGH (ref 70–99)
Potassium: 4.5 mmol/L (ref 3.5–5.1)
Sodium: 136 mmol/L (ref 135–145)
Total Bilirubin: 1.2 mg/dL (ref 0.3–1.2)
Total Protein: 6.3 g/dL — ABNORMAL LOW (ref 6.5–8.1)

## 2022-04-04 LAB — GLUCOSE, CAPILLARY
Glucose-Capillary: 130 mg/dL — ABNORMAL HIGH (ref 70–99)
Glucose-Capillary: 139 mg/dL — ABNORMAL HIGH (ref 70–99)
Glucose-Capillary: 162 mg/dL — ABNORMAL HIGH (ref 70–99)
Glucose-Capillary: 174 mg/dL — ABNORMAL HIGH (ref 70–99)

## 2022-04-04 LAB — MAGNESIUM: Magnesium: 2.2 mg/dL (ref 1.7–2.4)

## 2022-04-04 LAB — TRIGLYCERIDES: Triglycerides: 191 mg/dL — ABNORMAL HIGH (ref <150)

## 2022-04-04 LAB — PHOSPHORUS: Phosphorus: 4.7 mg/dL — ABNORMAL HIGH (ref 2.5–4.6)

## 2022-04-04 MED ORDER — TRACE MINERALS CU-MN-SE-ZN 300-55-60-3000 MCG/ML IV SOLN
INTRAVENOUS | Status: AC
  Filled 2022-04-04: qty 674.53

## 2022-04-04 MED ORDER — HYDROMORPHONE HCL 1 MG/ML IJ SOLN
0.5000 mg | INTRAMUSCULAR | Status: DC | PRN
  Administered 2022-04-04 – 2022-04-08 (×8): 1 mg via INTRAVENOUS
  Filled 2022-04-04 (×8): qty 1

## 2022-04-04 MED ORDER — PREGABALIN 75 MG PO CAPS
200.0000 mg | ORAL_CAPSULE | Freq: Three times a day (TID) | ORAL | Status: DC
  Administered 2022-04-04 – 2022-04-10 (×20): 200 mg via ORAL
  Filled 2022-04-04 (×21): qty 1

## 2022-04-04 MED ORDER — LIDOCAINE 5 % EX PTCH
2.0000 | MEDICATED_PATCH | CUTANEOUS | Status: DC
  Administered 2022-04-04 – 2022-04-05 (×2): 2 via TRANSDERMAL
  Filled 2022-04-04 (×7): qty 2

## 2022-04-04 MED ORDER — HYDROMORPHONE HCL 1 MG/ML IJ SOLN
0.5000 mg | Freq: Once | INTRAMUSCULAR | Status: AC
  Administered 2022-04-04: 0.5 mg via INTRAVENOUS
  Filled 2022-04-04: qty 0.5

## 2022-04-04 MED ORDER — OXYCODONE HCL 5 MG PO TABS
5.0000 mg | ORAL_TABLET | ORAL | Status: DC | PRN
  Administered 2022-04-04 (×2): 10 mg via ORAL
  Administered 2022-04-04: 5 mg via ORAL
  Administered 2022-04-04 – 2022-04-05 (×5): 10 mg via ORAL
  Administered 2022-04-06: 5 mg via ORAL
  Administered 2022-04-06 (×2): 10 mg via ORAL
  Administered 2022-04-07: 5 mg via ORAL
  Administered 2022-04-07 – 2022-04-09 (×8): 10 mg via ORAL
  Filled 2022-04-04: qty 2
  Filled 2022-04-04: qty 1
  Filled 2022-04-04 (×11): qty 2
  Filled 2022-04-04: qty 1
  Filled 2022-04-04 (×8): qty 2

## 2022-04-04 MED ORDER — CYCLOBENZAPRINE HCL 10 MG PO TABS
10.0000 mg | ORAL_TABLET | Freq: Three times a day (TID) | ORAL | Status: DC
  Administered 2022-04-04 – 2022-04-09 (×18): 10 mg via ORAL
  Filled 2022-04-04 (×19): qty 1

## 2022-04-04 MED ORDER — KETOROLAC TROMETHAMINE 30 MG/ML IJ SOLN
30.0000 mg | Freq: Four times a day (QID) | INTRAMUSCULAR | Status: AC
  Administered 2022-04-04 – 2022-04-09 (×20): 30 mg via INTRAVENOUS
  Filled 2022-04-04 (×21): qty 1

## 2022-04-04 NOTE — Plan of Care (Signed)
  Problem: Clinical Measurements: Goal: Will remain free from infection Outcome: Not Progressing   Problem: Clinical Measurements: Goal: Diagnostic test results will improve Outcome: Not Progressing   Problem: Activity: Goal: Risk for activity intolerance will decrease Outcome: Not Progressing   Problem: Pain Managment: Goal: General experience of comfort will improve Outcome: Not Progressing

## 2022-04-04 NOTE — Consult Note (Signed)
PHARMACY - TOTAL PARENTERAL NUTRITION CONSULT NOTE   Indication: Prolonged ileus  Patient Measurements: Height: '5\' 3"'$  (160 cm) Weight: 101.6 kg (223 lb 15.8 oz) IBW/kg (Calculated) : 52.4 TPN AdjBW (KG): 62.1 Body mass index is 39.68 kg/m.  Assessment:  Patient is [name] with medical history including recent laparoscopic adhesiolysis, left salpingectomy, drainage of peritoneal cyst, and chromotubation on 7/24 who presented to the ED 7/27 with severe pain in abdomen and vagina. Imaging concerning for post-operative abscess. Patient underwent CT drain placement into pelvic abscess on 7/28. Hospital course has been complicated by ileus. Pharmacy consulted to initiate TPN to provide nutrition given post-operative ileus.   Glucose / Insulin: BG 118-139 requiring 9 units insulin aspart SSI in the past 24 hours.  Insulin Glargine 10u HS added 8/3 Electrolytes: Na 136, K 4.5, Cl 102, Corrected Ca 9.5, Phos 4.7, Mg 2.2 Renal: Scr 0.49 Hepatic: LFTs remains WNL, Hyperbilirubinemia resolved Intake / Output; MIVF: JP drain left abdomen, colostomy LUQ. Drain with minimal output. Net: + 20.2L GI Imaging: 7/31 CTAP: Percutaneous drainage catheter is seen in left-sided abdominal abscess. Mild proximal small bowel dilatation is noted which is slightly improved compared to prior exam, most consistent with ileus. Stable mild ascites is noted in the pelvis and right side of the abdomen.  GI Surgeries / Procedures:  7/28: CT drain placement into pelvic abscess 8/2: ExLap with general surgery  Central access: PICC Double Lumen placed 8/1 TPN start date: 8/1   Nutritional Goals: Goal TPN rate is 75 mL/hr (provides 108 g of protein and 2217 kcals per day)  RD Assessment: Estimated Needs Total Energy Estimated Needs: 2000-2300kcal/day Total Protein Estimated Needs: 100-115g/day Total Fluid Estimated Needs: 1.6-1.8L/day  Current Nutrition:  Full liquids and TPN  Plan:  Will continue TPN at 62  ml/hr (less than goal rate) due pt's weight increase 23lb and is positive 18 L. (This bag will provide 1996 kcl) Protein 101.18 g Dextrose 297.6 g Lipids 58 g.  Electrolytes in TPN adjusted to: Na 30mq/L, K 327m/L, Ca 57m70mL, Mg 5 mEq/L, and Phos 2 mmol/L. Cl:Ac 1:1 K in TPN decreased to 30 mEq/L on 8/1September 02, 2024e to increasing K. K today 4.5 Phos in TPN deceased to 2 mmol/L on 8/1September 02, 2024e to mild hyperphosphatemia with normal renal function. Phos today 4.7  Add standard MVI and trace elements to TPN Thiamine 100 mg x 3 days (completed) Continue Moderate q6h SSI and adjust as needed  Continue insulin glargine 10 units at bedtime Monitor TPN labs on Mon/Thurs  CarGretel AcreharmD PGY1 Pharmacy Resident 8/1Sep 02, 202330 AM

## 2022-04-04 NOTE — Progress Notes (Signed)
PT Cancellation Note  Patient Details Name: Lacey Harrison MRN: [id number] DOB: 1985/08/27   Cancelled Treatment:     PT attempt. PT hold per pt/mother request. Pt had rough night pain wise and requested author return after lunch at 1pm. Pt did get up independently with mother to go to [place] earlier this morning without difficulty. As requested, Pryor Curia will return at [place] 04/04/2022, 9:24 AM

## 2022-04-04 NOTE — Progress Notes (Signed)
Date of Admission:  03/21/2022    ID: TWILLA KHOURI is a 36 y.o. female  Principal Problem:   Postoperative intra-abdominal abscess Active Problems:   Acute respiratory failure with hypoxia (HCC) in seting of MSKpain, atelectasis, severe sepsis    Severe sepsis (HCC) without septic shock, d/t postoperative abscess, s/p pelvic drain placement    Ileus, postoperative (HCC)   AKI (acute kidney injury) ([place]) in setting of sepsis, IV contrast, NAGMA - RESOLVED    Abdominal pain  Her friend  at bed side and patient gave permission to speak infront of her  Subjective: Doing better Upset that one of the drains was ripped of her abdomen No fever ambulating Medications:   Chlorhexidine Gluconate Cloth  6 each Topical Daily   cyclobenzaprine  10 mg Oral TID   enoxaparin (LOVENOX) injection  0.5 mg/kg Subcutaneous Q24H   feeding supplement  237 mL Oral TID BM   insulin aspart  0-15 Units Subcutaneous Q6H   insulin glargine-yfgn  10 Units Subcutaneous QHS   ketorolac  30 mg Intravenous Q6H   lidocaine  2 patch Transdermal Q24H   pantoprazole  40 mg Oral QHS   pregabalin  200 mg Oral TID   sodium chloride flush  10-40 mL Intracatheter Q12H   sodium chloride flush  5 mL Intracatheter Q8H    Objective: Vital signs in last 24 hours: Temp:  [98.2 F (36.8 C)-99.8 F (37.7 C)] 98.2 F (36.8 C) (08/10 0815) Pulse Rate:  [104-125] 104 (08/10 0815) Resp:  [16-20] 18 (08/10 0815) BP: (99-128)/(54-65) 128/63 (08/10 0815) SpO2:  [94 %-96 %] 96 % (08/10 0815)  LDA Rt PICc O/e awake and alert Chest b/l air entry Hss1s2 Abd some distension , but not like before Wound vac Both sides lower quadrant drains- left side serosanguinous Rt side a little thicker Colostomy stoma changing color - not as dark like beofre Edema legs and arms much better   Lab Results Recent Labs    04/03/22 0525 04/04/22 0450  WBC 13.7* 11.8*  HGB 8.0* 8.0*  HCT 25.9* 25.8*  NA  --  136  K  --  4.5   CL  --  102  CO2  --  26  BUN  --  9  CREATININE  --  0.49   Liver Panel Recent Labs    04/04/22 0450  PROT 6.3*  ALBUMIN 2.3*  AST 27  ALT 27  ALKPHOS 54  BILITOT 1.2    Microbiology:  ABUNDANT WBC PRESENT,BOTH PMN AND MONONUCLEAR  ABUNDANT GRAM NEGATIVE RODS  FEW GRAM POSITIVE RODS  FEW GRAM POSITIVE COCCI IN PAIRS  FEW BUDDING YEAST SEEN   Culture ABUNDANT ESCHERICHIA COLI  MODERATE CLOSTRIDIUM PERFRINGENS  Standardized susceptibility testing for this organism is not available.  ABUNDANT BACTEROIDES VULGATUS  BETA LACTAMASE NEGATIVE    Studies/Results: No results found.   Assessment/Plan: 36 year old female with recent robotic lap for adhesiolysis and left salpingectomy and drainage of the left hydrosalpinx peritoneal cyst which was complicated by pelvic abscess secondary to sigmoid perforation Initially underwent pelvic drain placement by IR.  Multiple organisms of colon origin and culture.  E. coli, Bacteroides, Clostridium perfringens and yeast.   Patient underwent exploratory laparotomy and sigmoid colon resection with colostomy.Fecal peritonitis was present Initially on Unasyn and fluconazole--  Day 13 of antibiotic and day 9 since surgery Because of worsening leucocytosis changed unasyn to meropenem on 04/02/22  CT abdomen showed further collection s/p 2 more drain placement Leucocytosis is  almost resolved- with adequate source control, we may be able to stop antibiotics on Monday Fever has resolved Anemia  AKI has resolved  Septic shock has resolved  Hypoalbuminemia  Hyperbilirubinemia   Discussed the management  with patient.   RCID covering Friday/sat/Sunday Available by phone for urgent issues

## 2022-04-04 NOTE — Progress Notes (Signed)
Inpatient Rehab Admissions Coordinator:   Continue to follow for post-acute rehab needs.  Note pain control difficult at this time, limiting pt's ability to fully participate in therapy, but she is able to get up with her family to [facility] when needed.  She may not require intense rehab stay once medically cleared.  I will continue to follow progress.  Shann Medal, PT, DPT Admissions Coordinator [phone] 04/04/22  12:00 PM

## 2022-04-04 NOTE — Progress Notes (Signed)
       CROSS COVER NOTE  NAME: Lacey Harrison MRN: [id number] DOB : Jun 04, 1986    Date of Service   04/04/22  HPI/Events of Note   Medication request received to address 10/10 Abd pain refractory to oxycodone and fentanyl; on review of chart [name] started reporting 10/10 pain last evening shortly after transition from dilaudid to fentanyl 2/2 Dilaudid causing cramps. Pt is requesting dilaudid reporting it provided more effective pain relief.  Interventions   Plan: Dilaudid 0.'5mg'$   x1    This document was prepared using Dragon voice recognition software and may include unintentional dictation errors.  Neomia Glass DNP, MHA, FNP-BC Nurse Practitioner [facility] Pager [phone]

## 2022-04-04 NOTE — Progress Notes (Signed)
PROGRESS NOTE    Lacey Harrison   OQH:[id number] DOB: 1986-05-06  DOA: 03/21/2022 Date of Service: 04/04/22 PCP: Rubie Maid, MD     Brief Narrative / Hospital Course:  36 yo F presenting to [facility] ED from home on 03/21/22 with complaints of sudden onset severe abdominal pain. She recently on 03/18/22 had robotic adhesiolysis, left salpingectomy, drainage of 500 mL from large LEFT peritoneal cyst & chromotubation. She was discharged home on 5/46 without complication. She reported being on the floor with her cat and twisting her body around, with sudden acute abdominal pain. Patient complained of severe pain in lower abdomen radiating towards her vagina.  07/27: CT imaging abscess. Leukocytosis with left shift at 16.8. Initiated on IV Zosyn and Doxycycline. Admitted to OBGYN.  07/28: IR procedure 07/28 CT drain placement into pelvic abscess, left anterior approach, 12 Fr to bulb suction. Patient developed severe sepsis/septic shock.  She required transfer to stepdown unit, due to concerns for respiratory status PCCM consulted, receiving BS abx w/ zosyn, doxycycline & vancomycin, volume resuscitation w/ IVF, awaiting culture results   07/29: Remains in SDU w/ ileus and sepsis. IV antibiotics. Repeat CT abdomen pelvis , new loculations on the right, anasarca, distended urinary bladder. No contrast extravasation to suggest bowel perforation 07/30: Continues to have abdominal pain, required NG, abdomen less distended.  07/31: repeat CT The abscess is slightly decreased compared to prior exam. Continuing on Unasyn and Diflucan.  08/01: VSS, WBC trending down, BCx NGx4d. Ileus no better. Drain in place, minimal output. NG in place. PICC and TPN initiated. Spoke w/ GYN, plan for surgical consult and ID consult.  8/02: Patient continued to have persistently elevated white cell count with abdominal pain.  General surgery has scheduled exploratory laparotomy. 8/03: She  underwent Exploratory laparotomy,  sigmoid colectomy with colostomy creation, Hartmann procedure, lysis of additions.  Continue TPN and IV antibiotics. 08/04: Leukocytosis persist could be due to stress response.  Remains afebrile, continue NPO. and TPN, wound care consult, colostomy education. 08/05: NG tube removed, Foley removed.  Patient started on clear liquid diet, advance as tolerated. 08/07: WBC remains elevated, continues to have pain.  General surgery ordered CT abdomen and pelvis to rule out intra-abdominal process. 08/08: Repeat CT scan abdomen shows persistent pockets of fluid. IR performed CT-guided placement of drain x 2 into RLQ, LLQ/pelvis. 08/09: WBC improving, tolerating CLD trial FLD today.  08/10: tolerating FLD, pain control overnight was an issue, meds adjusted his AM per Gen Surg team, removed surgical drain, continue IR drains and wound VAC. Continue IV Abx (meropenem); new Cx pending (NGx12h); ID on board    Consultants:  GYN PCCM IR General Surgery Infectious Disease   Procedures: 07/28 CT drain placement into pelvic abscess 07/30 NG tube placed  07/31 PICC line. 08/03 Exploratory laparotomy, sigmoid colectomy with colostomy creation, Hartmann procedure, lysis of additions 08/08: Repeat CT scan abdomen shows persistent pockets of fluid. IR performed CT-guided placement of drain x 2 into RLQ, LLQ/pelvis.    Subjective: Patient reports pain is better controlled this morning on the Dilaudid.  Working with PT.     ASSESSMENT & PLAN:   Principal Problem:   Postoperative intra-abdominal abscess Active Problems:   Acute respiratory failure with hypoxia (HCC) in seting of MSKpain, atelectasis, severe sepsis    Severe sepsis ([place]) without septic shock, d/t postoperative abscess, s/p pelvic drain placement    Ileus, postoperative ([place])   AKI (acute kidney injury) ([place]) in setting of sepsis, IV  contrast, NAGMA - RESOLVED    Abdominal pain   INFECTIOUS DISEASE Severe sepsis ([place]) without  septic shock - RESOLVED Postoperative intraabdominal abscess/peritonitis -  IMPROVING/STABLE 36 year old female with recent robotic lap for adhesiolysis and left salpingectomy and drainage of the left hydrosalpinx peritoneal cyst which was complicated by pelvic abscess secondary to sigmoid perforation. Initially underwent pelvic drain placement by IR.  Multiple organisms of colon origin and culture.  E. coli, Bacteroides, Clostridium perfringens and yeast. Patient underwent exploratory laparotomy and sigmoid colon resection with colostomy. Fecal peritonitis was present, she has been on Unasyn and fluconazole, changed to meropenem.  IR placed 2 additional drains 04/02/22.  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/perforation 03/28/22 Exploratory laparotomy, sigmoid colectomy with colostomy creation, Hartmann procedure, lysis of additions Advancing from clear liquid to full liquid diet Continue TPN General surgery following Bowel regimen PPI Pain control   RESPIRATORY Acute respiratory failure with hypoxia (HCC) in seting of MSKpain, atelectasis, severe sepsis - RESOLVED O2 prn  RENAL AKI (acute kidney injury) ([place]) in setting of sepsis, IV contrast, NAGMA - RESOLVED Serial BMP, Mg, Phos  CARDIOVASCULAR Stable  GENITOURINARY & REPRODUCTIVE Stable   NEUROLOGICAL & PSYCHIATRIC Stable Ambien prn   ENDOCRINE SSI  MUSCULOSKELETAL/RHEUM Stable Eval for inpatient rehab is underway  DVT prophylaxis: lovenox Code Status: FULL Family Communication: Millie at bedside on rounds Disposition Plan / TOC needs: inpatient for now Barriers to discharge / significant pending items: pending culture results and ID recommendations, still on TPN and slowly transitioning to full oral diet, inpatient rehab evaluation, GenSurg and ID  plan/clearance              Objective: Vitals:   04/04/22 0158 04/04/22 0313 04/04/22 0806 04/04/22 0815  BP: 101/63 (!) 106/54 128/63 128/63  Pulse: (!) 109 (!) 109 (!) 125 (!) 104  Resp: '18 16 18 18  '$ Temp: 98.3 F (36.8 C) 99.8 F (37.7 C) 98.2 F (36.8 C) 98.2 F (36.8 C)  TempSrc:  Oral Oral Oral  SpO2: 95% 96% 94% 96%  Weight:      Height:        Intake/Output Summary (Last 24 hours) at 04/04/2022 0905 Last data filed at 04/04/2022 6720 Gross per 24 hour  Intake 2204.98 ml  Output 290 ml  Net 1914.98 ml   Filed Weights   03/31/22 0500 04/01/22 0429 04/02/22 0500  Weight: 104.4 kg 102.9 kg 101.6 kg    Examination:  Constitutional:  VS as above General Appearance: alert, well-developed, well-nourished, NAD Eyes: Normal lids and conjunctive, non-icteric sclera Ears, Nose, Mouth, Throat: Normal external appearance MMM Neck: No masses, trachea midline Respiratory: Normal respiratory effort Cardiovascular: S1/S2 normal No lower extremity edema Musculoskeletal:  No clubbing/cyanosis of digits Symmetrical movement in all extremities Neurological: No cranial nerve deficit on limited exam Alert Psychiatric: Normal judgment/insight Normal mood and affect       Scheduled Medications:   Chlorhexidine Gluconate Cloth  6 each Topical Daily   cyclobenzaprine  10 mg Oral TID   enoxaparin (LOVENOX) injection  0.5 mg/kg Subcutaneous Q24H   feeding supplement  237 mL Oral TID BM   insulin aspart  0-15 Units Subcutaneous Q6H   insulin glargine-yfgn  10 Units Subcutaneous QHS   ketorolac  30 mg Intravenous Q6H   lidocaine  2 patch Transdermal Q24H   pantoprazole  40 mg Oral QHS  pregabalin  200 mg Oral TID   sodium chloride flush  10-40 mL Intracatheter Q12H   sodium chloride flush  5 mL Intracatheter Q8H    Continuous Infusions:  sodium chloride 10 mL/hr at 04/04/22 0211   fluconazole (DIFLUCAN) IV Stopped (04/03/22 1731)   meropenem (MERREM)  IV 1 g (04/04/22 0510)   TPN ADULT (ION) 62 mL/hr at 04/04/22 0113    PRN Medications:  sodium chloride, albuterol, alum & mag hydroxide-simeth, bisacodyl, diazepam, diphenhydrAMINE, HYDROmorphone (DILAUDID) injection, iohexol, ondansetron **OR** ondansetron (ZOFRAN) IV, mouth rinse, oxyCODONE, simethicone, sodium chloride flush, zolpidem  Antimicrobials:  Anti-infectives (From admission, onward)    Start     Dose/Rate Route Frequency Ordered Stop   04/01/22 1500  meropenem (MERREM) 1 g in sodium chloride 0.9 % 100 mL IVPB        1 g 200 mL/hr over 30 Minutes Intravenous Every 8 hours 04/01/22 1404     03/25/22 1600  Ampicillin-Sulbactam (UNASYN) 3 g in sodium chloride 0.9 % 100 mL IVPB  Status:  Discontinued        3 g 200 mL/hr over 30 Minutes Intravenous Every 6 hours 03/25/22 1134 04/01/22 1354   03/24/22 2200  clindamycin (CLEOCIN) IVPB 900 mg  Status:  Discontinued        900 mg 100 mL/hr over 30 Minutes Intravenous Every 8 hours 03/24/22 1854 03/25/22 1134   03/23/22 1500  fluconazole (DIFLUCAN) IVPB 400 mg        400 mg 100 mL/hr over 120 Minutes Intravenous Every 24 hours 03/23/22 1358     03/22/22 1851  vancomycin variable dose per unstable renal function (pharmacist dosing)  Status:  Discontinued         Does not apply See admin instructions 03/22/22 1851 03/23/22 1051   03/22/22 1645  vancomycin (VANCOCIN) IVPB 1000 mg/200 mL premix  Status:  Discontinued        1,000 mg 200 mL/hr over 60 Minutes Intravenous  Once 03/22/22 1553 03/22/22 1558   03/22/22 1645  vancomycin (VANCOREADY) IVPB 2000 mg/400 mL        2,000 mg 200 mL/hr over 120 Minutes Intravenous  Once 03/22/22 1558 03/22/22 1814   03/22/22 0400  piperacillin-tazobactam (ZOSYN) IVPB 3.375 g  Status:  Discontinued        3.375 g 12.5 mL/hr over 240 Minutes Intravenous Every 8 hours 03/21/22 2047 03/25/22 1134   03/21/22 2200  doxycycline (VIBRAMYCIN) 100 mg in sodium chloride 0.9 % 250 mL IVPB  Status:   Discontinued        100 mg 125 mL/hr over 120 Minutes Intravenous Every 12 hours 03/21/22 2047 03/23/22 1358   03/21/22 1900  piperacillin-tazobactam (ZOSYN) IVPB 3.375 g        3.375 g 12.5 mL/hr over 240 Minutes Intravenous STAT 03/21/22 1853 03/21/22 2300       Data Reviewed: I have personally reviewed following labs and imaging studies  CBC: Recent Labs  Lab 03/31/22 0611 04/01/22 0435 04/02/22 0436 04/03/22 0525 04/04/22 0450  WBC 18.0* 19.6* 19.0* 13.7* 11.8*  HGB 8.8* 8.9* 8.5* 8.0* 8.0*  HCT 28.3* 28.5* 27.1* 25.9* 25.8*  MCV 91.3 90.2 91.6 92.2 91.2  PLT 275 319 377 392 417*   Basic Metabolic Panel: Recent Labs  Lab 03/29/22 0422 03/30/22 0740 03/31/22 0611 04/01/22 0435 04/02/22 0436 04/04/22 0450  NA 143 141 139 140  --  136  K 3.3* 3.9 4.3 4.4  --  4.5  CL 112* 106 106 107  --  102  CO2 '28 26 26 24  '$ --  26  GLUCOSE 148* 127* 105* 106*  --  118*  BUN '9 11 11 10  '$ --  9  CREATININE 0.41* 0.48 0.46 0.52  --  0.49  CALCIUM 7.5* 8.3* 7.9* 8.2*  --  8.1*  MG 1.7  --  2.0 2.1  --  2.2  PHOS 3.4  --   --  5.2* 4.5 4.7*   GFR: Estimated Creatinine Clearance: 110.7 mL/min (by C-G formula based on SCr of 0.49 mg/dL). Liver Function Tests: Recent Labs  Lab 03/29/22 0422 04/01/22 0435 04/04/22 0450  AST 32 33 27  ALT '18 30 27  '$ ALKPHOS 48 76 54  BILITOT 2.1* 2.3* 1.2  PROT 4.2* 5.9* 6.3*  ALBUMIN 1.8* 2.0* 2.3*   No results for input(s): "LIPASE", "AMYLASE" in the last 168 hours. No results for input(s): "AMMONIA" in the last 168 hours. Coagulation Profile: Recent Labs  Lab 04/02/22 0436  INR 1.0   Cardiac Enzymes: No results for input(s): "CKTOTAL", "CKMB", "CKMBINDEX", "TROPONINI" in the last 168 hours. BNP (last 3 results) No results for input(s): "PROBNP" in the last 8760 hours. HbA1C: No results for input(s): "HGBA1C" in the last 72 hours. CBG: Recent Labs  Lab 04/03/22 0528 04/03/22 1152 04/03/22 1713 04/03/22 2318 04/04/22 0508   GLUCAP 134* 137* 150* 145* 139*   Lipid Profile: Recent Labs    04/04/22 0450  TRIG 191*   Thyroid Function Tests: No results for input(s): "TSH", "T4TOTAL", "FREET4", "T3FREE", "THYROIDAB" in the last 72 hours. Anemia Panel: No results for input(s): "VITAMINB12", "FOLATE", "FERRITIN", "TIBC", "IRON", "RETICCTPCT" in the last 72 hours. Urine analysis:    Component Value Date/Time   COLORURINE RED (A) 03/24/2022 1802   APPEARANCEUR TURBID (A) 03/24/2022 1802   LABSPEC 1.045 (H) 03/24/2022 1802   PHURINE  03/24/2022 1802    TEST NOT REPORTED DUE TO COLOR INTERFERENCE OF URINE PIGMENT   GLUCOSEU (A) 03/24/2022 1802    TEST NOT REPORTED DUE TO COLOR INTERFERENCE OF URINE PIGMENT   HGBUR NEGATIVE 03/24/2022 1802   BILIRUBINUR (A) 03/24/2022 1802    TEST NOT REPORTED DUE TO COLOR INTERFERENCE OF URINE PIGMENT   KETONESUR (A) 03/24/2022 1802    TEST NOT REPORTED DUE TO COLOR INTERFERENCE OF URINE PIGMENT   PROTEINUR (A) 03/24/2022 1802    TEST NOT REPORTED DUE TO COLOR INTERFERENCE OF URINE PIGMENT   NITRITE (A) 03/24/2022 1802    TEST NOT REPORTED DUE TO COLOR INTERFERENCE OF URINE PIGMENT   LEUKOCYTESUR (A) 03/24/2022 1802    TEST NOT REPORTED DUE TO COLOR INTERFERENCE OF URINE PIGMENT   Sepsis Labs: '@LABRCNTIP'$ (procalcitonin:4,lacticidven:4)  Recent Results (from the past 240 hour(s))  Aerobic/Anaerobic Culture w Gram Stain (surgical/deep wound)     Status: None (Preliminary result)   Collection Time: 04/02/22  1:00 PM   Specimen: Abscess  Result Value Ref Range Status   Specimen Description ABSCESS  Final   Special Requests RLQ ABDOMINAL ABSCESS  Final   Gram Stain   Final    ABUNDANT WBC PRESENT,BOTH PMN AND MONONUCLEAR NO ORGANISMS SEEN    Culture   Final    NO GROWTH < 12 HOURS Performed at [facility], [place] [address] [place], [place] [address]    Report Status PENDING  Incomplete  Aerobic/Anaerobic Culture w Gram Stain (surgical/deep wound)     Status:  None (Preliminary result)   Collection Time: 04/02/22  1:01 PM   Specimen: Abscess  Result Value Ref Range Status   Specimen Description ABSCESS  Final   Special Requests LLQ ABDOMINAL ABSCESS  Final   Gram Stain   Final    ABUNDANT WBC PRESENT,BOTH PMN AND MONONUCLEAR NO ORGANISMS SEEN    Culture   Final    NO GROWTH < 12 HOURS Performed at [facility], [place] [address] [place], [place] [address]    Report Status PENDING  Incomplete         Radiology Studies: DG Chest 2 View  Result Date: 03/22/2022 CLINICAL DATA:  Shortness of breath. EXAM: CHEST - 2 VIEW COMPARISON:  Two-view chest x-ray 07/26/2016.  CT chest 03/21/2022 FINDINGS: Low lung volumes exaggerate the heart size. Left lower lobe airspace opacities are again noted. No other significant airspace disease is present. IMPRESSION: Persistent left lower lobe airspace disease concerning for pneumonia. Electronically Signed   By: San Morelle M.D.   On: 03/22/2022 21:46   CT IMAGE GUIDED DRAINAGE PERCUT CATH  PERITONEAL RETROPERIT  Result Date: 03/22/2022 INDICATION: Abdominopelvic abscess EXAM: CT-guided placement of drainage catheter into abdominopelvic abscess TECHNIQUE: Multidetector CT imaging of the abdomen and pelvis was performed following the standard protocol without IV contrast. RADIATION DOSE REDUCTION: This exam was performed according to the departmental dose-optimization program which includes automated exposure control, adjustment of the mA and/or kV according to patient size and/or use of iterative reconstruction technique. MEDICATIONS: Per EMR ANESTHESIA/SEDATION: Local analgesia COMPLICATIONS: None immediate. PROCEDURE: Informed written consent was obtained from the patient after a thorough discussion of the procedural risks, benefits and alternatives. All questions were addressed. Maximal Sterile Barrier Technique was utilized including caps, mask, sterile gowns, sterile gloves, sterile drape, hand  hygiene and skin antiseptic. A timeout was performed prior to the initiation of the procedure. The patient was placed supine on the exam table. Limited CT of the abdomen and pelvis was performed for planning purposes. This again demonstrated an air in fluid collection in the lower abdomen and pelvis. Skin entry site was marked with a planned anterolateral approach from the left lower quadrant. The overlying skin was prepped and draped in a standard sterile fashion. Local analgesia was obtained with 1% lidocaine. Using intermittent CT fluoroscopy, an 18 gauge trocar needle was advanced towards the identified abdominopelvic fluid collection. Access was confirmed with CT and return of purulent material. An 035 wire was then advanced through the access needle, over which the percutaneous tract was serially dilated to accommodate a 12 [place] multipurpose locking drainage catheter. Location was again confirmed with CT and return of additional purulent material. Catheter was secured to the skin using silk suture and a dressing. It was attached to bulb suction. The patient tolerated the procedure well without immediate complication. IMPRESSION: Successful CT-guided placement of a 12 French locking drainage catheter into the lower abdominopelvic abscess. Sample sent to the lab for microbiology analysis. Electronically Signed   By: Albin Felling M.D.   On: 03/22/2022 15:12   CT ABDOMEN PELVIS W CONTRAST  Result Date: 03/21/2022 CLINICAL DATA:  Abdominal pain, post-op Abdominal pain, acute, nonlocalized. Robotic salpingectomy 03/18/2022 EXAM: CT ABDOMEN AND PELVIS WITH CONTRAST TECHNIQUE: Multidetector CT imaging of the abdomen and pelvis was performed using the standard protocol following bolus administration of intravenous contrast. RADIATION DOSE REDUCTION: This exam was performed according to the departmental dose-optimization program which includes automated exposure control, adjustment of the mA and/or kV according  to patient size and/or use of iterative reconstruction technique. CONTRAST:  122m OMNIPAQUE IOHEXOL 300 MG/ML  SOLN COMPARISON:  None Available. FINDINGS: Lower chest: Left base atelectasis.  No acute abnormality. Hepatobiliary: Liver is enlarged measuring up to 19 cm. The hepatic parenchyma is diffusely hypodense compared to the splenic parenchyma consistent with fatty infiltration. No focal liver abnormality. No gallstones, gallbladder wall thickening, or pericholecystic fluid. No biliary dilatation. Pancreas: No focal lesion. Normal pancreatic contour. No surrounding inflammatory changes. No main pancreatic ductal dilatation. Spleen: Normal in size without focal abnormality. Adrenals/Urinary Tract: No adrenal nodule bilaterally. Bilateral kidneys enhance symmetrically. No hydronephrosis. No hydroureter. The urinary bladder is unremarkable. On delayed imaging, there is no urothelial wall thickening and there are no filling defects in the opacified portions of the bilateral collecting systems or ureters as well as urinary bladder. Stomach/Bowel: Stomach is within normal limits. No evidence of bowel wall thickening or dilatation. Appendix appears normal. Vascular/Lymphatic: No abdominal aorta or iliac aneurysm. No abdominal, pelvic, or inguinal lymphadenopathy. Reproductive: Uterus is unremarkable no adnexal mass identified. Other: No intraperitoneal free fluid. Small volume free intraperitoneal gas. There is a gas and fluid collection with peripheral enhancement measuring approximately 8 x 9 x 7.5 cm centered within the left rectouterine pouch. Musculoskeletal: No abdominal wall hernia or abnormality. No suspicious lytic or blastic osseous lesions. No acute displaced fracture. IMPRESSION: 1. An 8 x 9 x 7.5cm gas and fluid collection centered within the left rectouterine pouch likely represents a developing abscess. No findings to suggest bowel injury; however, this is not fully excluded. If a CT is repeated in the  near future, consider use of IV and PO contrast. 2. Small volume pneumoperitoneum likely postsurgical in etiology. 3. Hepatomegaly and hepatic steatosis. Electronically Signed   By: Iven Finn M.D.   On: 03/21/2022 18:43            LOS: 14 days      Emeterio Reeve, DO [facility] 04/04/2022, 9:05 AM   Staff may message me via secure chat in [place]  but this may not receive immediate response,  please page for urgent matters!  If 7PM-7AM, please contact night-coverage [email or web address]  Dictation software was used to generate the above note. Typos may occur and escape review, as with typed/written notes. Please contact Dr Sheppard Coil directly for clarity if needed.

## 2022-04-04 NOTE — Progress Notes (Signed)
GYNECOLOGY INPATIENT PROGRESS NOTE POD #16 s/p robotic assisted laparosocopic lysis of adhesions, left salpingectomy, drainage of large peritoneal cyst, chromotubation Hospital Day # 30, readmitted for suspected pelvic abscess, developing sepsis.   POD#12 s/p CT guided drainage of pelvic abscess POD# 8, s/p exploratory laparotomy, colectomy with Hartmann's procedure POD# 2, s/p CT guided drainage of new abdominal fluid collection (infahepatic)  Subjective: Patient crying in room, just recently had one of her drains removed, notes it was very painful.  Appears to also be having mild panic attack.   Objective: Temp:  [98.2 F (36.8 C)-99.8 F (37.7 C)] 98.2 F (36.8 C) (08/10 0815) Pulse Rate:  [104-125] 104 (08/10 0815) Resp:  [16-20] 18 (08/10 0815) BP: (99-128)/(54-65) 128/63 (08/10 0815) SpO2:  [94 %-96 %] 96 % (08/10 0815)  I/O last 3 completed shifts: In: 3714.1 [P.O.:120; I.V.:2849.7; Other:20; IV Piggyback:724.4] Out: 355 [Drains:355] No intake/output data recorded.    sodium chloride 10 mL/hr at 04/04/22 0211   fluconazole (DIFLUCAN) IV Stopped (04/03/22 1731)   meropenem (MERREM) IV 1 g (04/04/22 0510)   TPN ADULT (ION) 62 mL/hr at 04/04/22 0113      Chlorhexidine Gluconate Cloth  6 each Topical Daily   cyclobenzaprine  10 mg Oral TID   enoxaparin (LOVENOX) injection  0.5 mg/kg Subcutaneous Q24H   feeding supplement  237 mL Oral TID BM   insulin aspart  0-15 Units Subcutaneous Q6H   insulin glargine-yfgn  10 Units Subcutaneous QHS   ketorolac  30 mg Intravenous Q6H   lidocaine  2 patch Transdermal Q24H   pantoprazole  40 mg Oral QHS   pregabalin  200 mg Oral TID   sodium chloride flush  10-40 mL Intracatheter Q12H   sodium chloride flush  5 mL Intracatheter Q8H    Physical Exam:  General: alert and moderate distress  Abdomen: deferred as exam just completed by General Surgery prior to my entrance.  Pelvis: Deferred. Patient reports she is on her menses.   Extremities: DVT Evaluation: No evidence of DVT seen on physical exam. SCDs in place. Left arm    Labs:     Latest Ref Rng & Units 04/04/2022    4:50 AM 04/03/2022    5:25 AM 04/02/2022    4:36 AM  CBC  WBC 4.0 - 10.5 K/uL 11.8  13.7  19.0   Hemoglobin 12.0 - 15.0 g/dL 8.0  8.0  8.5   Hematocrit 36.0 - 46.0 % 25.8  25.9  27.1   Platelets 150 - 400 K/uL 437  392  377         Latest Ref Rng & Units 04/04/2022    4:50 AM 04/01/2022    4:35 AM 03/31/2022    6:11 AM  CMP  Glucose 70 - 99 mg/dL 118  106  105   BUN 6 - 20 mg/dL '9  10  11   '$ Creatinine 0.44 - 1.00 mg/dL 0.49  0.52  0.46   Sodium 135 - 145 mmol/L 136  140  139   Potassium 3.5 - 5.1 mmol/L 4.5  4.4  4.3   Chloride 98 - 111 mmol/L 102  107  106   CO2 22 - 32 mmol/L '26  24  26   '$ Calcium 8.9 - 10.3 mg/dL 8.1  8.2  7.9   Total Protein 6.5 - 8.1 g/dL 6.3  5.9    Total Bilirubin 0.3 - 1.2 mg/dL 1.2  2.3    Alkaline Phos 38 - 126 U/L  54  76    AST 15 - 41 U/L 27  33    ALT 0 - 44 U/L 27  30       Assessment/Plan: - Continuing post-operative management per General Surgery. Patient with 1 drain removed today.  Patient in significant pain and having some level of anxiety. Given dose of Valium.  - Currently receiving TPN, also full liquid diet.  - Continue PT/OT.  - Antibiotics:per ID.    - Hospitalist currently also on board for general management.  - Will continue to follow as GYN.     A total of 35 minutes were spent during this encounter, including review of previous progress notes, recent imaging and labs, face-to-face with time with patient involving counseling and coordination of care, as well as documentation for current visit.   Rubie Maid, MD [facility]

## 2022-04-04 NOTE — Progress Notes (Signed)
Physical Therapy Treatment Patient Details Name: Lacey Harrison MRN: [id number] DOB: 25-May-1986 Today's Date: 04/04/2022   History of Present Illness Pt is a 36 y.o. female presenting to hospital post op day 3 from robotic assisted laparoscopic surgery (lysis of adhesions, left salpingectomy, drainage of large peritoneal cyst, chromotubation 7/24) c/o sudden onset of severe abdominal pain.  CT drain placement into pelvic abscess 7/28.  Pt developed severe sepsis/septic shock after drainage of abdominal pelvic abscess and transferred to ICU.  Pt also noted with acute hypoxic respiratory distress and AKI; developing ileus.  S/p 8/2 exploratory laparotomy, sigmoid colectomy with colostomy creation, and Hartman's procedure for perforated sigmoid colon.  PMH includes smoking, anxiety, lyme disease, migraines, PNA, chromopertubation, cystectomy L, infertility, previous surgery for L ovarian cystectomy L for dermoid cyst complicated by cystotomy and bladder repair in 2007, pelvic pain (underwent 02/16/18 lap and extensive abdominopelvic adhesiolysis).    PT Comments    Pt was A and O x 4 with supportive friend and RN at bedside. She agrees to session an is cooperative throughout. Pt was premedicated prior to session for pain and was able to tolerate much more activity with less overall pain. Pain still continues to be most limiting. She required supervision to exit bed, stand, and ambulate 200 ft with RW. Several standing rest due to pain but overall strength and activity tolerance is greatly improved. DC recs are updated to home with HHPT. If she continues to progress quickly, pt may be able to DC home with OP. Acute PT will continue to follow and progress as able per current POC.    Recommendations for follow up therapy are one component of a multi-disciplinary discharge planning process, led by the attending physician.  Recommendations may be updated based on patient status, additional functional criteria  and insurance authorization.  Follow Up Recommendations  Home health PT (pt is progressing quickly.Recommend HHPT currently however if pt continues to progress, may be able to go outpatient)     Assistance Recommended at Discharge Intermittent Supervision/Assistance  Patient can return home with the following Assistance with cooking/housework;Assist for transportation;Help with stairs or ramp for entrance;A lot of help with walking and/or transfers;A lot of help with bathing/dressing/bathroom   Equipment Recommendations  Rolling walker (2 wheels);BSC/3in1;Hospital bed       Precautions / Restrictions Precautions Precautions: Fall Precaution Comments: JP drain L abdomen, NG tube, foley, picc line, long abdominal incision; L UQ colostomy, two new ab drains Restrictions Weight Bearing Restrictions: No     Mobility  Bed Mobility Overal bed mobility: Needs Assistance Bed Mobility: Supine to Sit, Sit to Supine Rolling: Supervision Sidelying to sit: Supervision Supine to sit: Supervision Sit to supine: Supervision Sit to sidelying: Supervision General bed mobility comments: pt was able to exit bed without physical assistance. vcs for technique however due to pain, pt likes to perform her way.    Transfers Overall transfer level: Needs assistance Equipment used: Rolling walker (2 wheels) Transfers: Sit to/from Stand Sit to Stand: Supervision (lowest bed height)           General transfer comment: increased time to perform due to pain. Pt was able to stand without physical assistance. author provided assistance with management of time only.    Ambulation/Gait Ambulation/Gait assistance: Supervision Gait Distance (Feet): 200 Feet Assistive device: Rolling walker (2 wheels) Gait Pattern/deviations: Step-through pattern, Antalgic Gait velocity: decreased     General Gait Details: pt was able to ambulate 200 ft with RW. Does require 3 standing  rest due to pain but overall  tolerated well.      Balance Overall balance assessment: Needs assistance Sitting-balance support: Feet supported, Bilateral upper extremity supported Sitting balance-Leahy Scale: Good     Standing balance support: During functional activity, Reliant on assistive device for balance, Bilateral upper extremity supported Standing balance-Leahy Scale: Good       Cognition Arousal/Alertness: Awake/alert Behavior During Therapy: WFL for tasks assessed/performed, Anxious Overall Cognitive Status: Within Functional Limits for tasks assessed      General Comments: Pleasant, motivated               Pertinent Vitals/Pain Pain Assessment Pain Assessment: 0-10 Pain Score: 7  Pain Location: abdomen Pain Descriptors / Indicators: Discomfort, Grimacing, Guarding, Shooting Pain Intervention(s): Limited activity within patient's tolerance, Premedicated before session, Monitored during session, Repositioned     PT Goals (current goals can now be found in the care plan section) Acute Rehab PT Goals Patient Stated Goal: less pain with movement Progress towards PT goals: Progressing toward goals    Frequency    7X/week      PT Plan Discharge plan needs to be updated    Co-evaluation     PT goals addressed during session: Mobility/safety with mobility;Balance;Proper use of DME;Strengthening/ROM        AM-PAC PT "6 Clicks" Mobility   Outcome Measure  Help needed turning from your back to your side while in a flat bed without using bedrails?: A Little Help needed moving from lying on your back to sitting on the side of a flat bed without using bedrails?: A Little Help needed moving to and from a bed to a chair (including a wheelchair)?: A Little Help needed standing up from a chair using your arms (e.g., wheelchair or bedside chair)?: A Little Help needed to walk in hospital room?: A Little Help needed climbing 3-5 steps with a railing? : A Little 6 Click Score: 18     End of Session   Activity Tolerance: Patient tolerated treatment well Patient left: in bed;with call bell/phone within reach;with family/visitor present Nurse Communication: Mobility status PT Visit Diagnosis: Other abnormalities of gait and mobility (R26.89);Muscle weakness (generalized) (M62.81);Pain     Time: 1610-9604 PT Time Calculation (min) (ACUTE ONLY): 21 min  Charges:  $Gait Training: 8-22 mins                    Julaine Fusi PTA 04/04/22, 3:37 PM

## 2022-04-04 NOTE — Progress Notes (Signed)
[facility] Hospital Day(s): [place] op day(s): 8 Days Post-Op.   Interval History:  Patient seen and examined No acute events or new complaints overnight.  Patient reports trouble with pain control overnight; cramping abdominal pain No fever, chills, nausea, emesis Leukocytosis continues to improve; now 11.8K Hgb stable at 8.0; ? dilutional; no evidence of bleeding Renal function normal; sCr - 0.49; UO - unmeasured No significant electrolyte derangements  Drains are as follows:   - Surgical Drain: 20 ccs: serous  - LLQ Drain (IR): 70 ccs; serous  - RLQ Drain (IR): 50 ccs; serous Now on meropenem; new Cx w/o growth  Ostomy just with bowel sweat; no gas She is on FLD; tolerating  Vital signs in last 24 hours: [min-max] current  Temp:  [98.3 F (36.8 C)-100.3 F (37.9 C)] 99.8 F (37.7 C) (08/10 0313) Pulse Rate:  [109-116] 109 (08/10 0313) Resp:  [16-20] 16 (08/10 0313) BP: (99-112)/(54-65) 106/54 (08/10 0313) SpO2:  [89 %-96 %] 96 % (08/10 0313)     Height: '5\' 3"'$  (160 cm) Weight: 101.6 kg BMI (Calculated): 39.69   Intake/Output last 2 shifts:  08/09 0701 - 08/10 0700 In: 2205 [P.O.:120; I.V.:1738; IV Piggyback:327] Out: 290 [Drains:290]   Physical Exam:  Constitutional: alert, cooperative and no distress  Respiratory: breathing non-labored at rest  Cardiovascular: regular rate and sinus rhythm  Gastrointestinal: Soft, expected tenderness, non-distended, no rebound/guarding. Surgical drain in LLQ; output serous: IR drains in RLQ and LLQ output is thicker serous fluid, no gross purulence. 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; dusky, minimal bowel sweat present Integumentary: Midline wound healing via secondary intention, 27 x 6 x 6 cm; wound vac present; good seal   Labs:     Latest Ref Rng & Units 04/04/2022    4:50 AM 04/03/2022    5:25 AM 04/02/2022    4:36 AM  CBC  WBC 4.0 - 10.5 K/uL 11.8  13.7  19.0   Hemoglobin 12.0 - 15.0  g/dL 8.0  8.0  8.5   Hematocrit 36.0 - 46.0 % 25.8  25.9  27.1   Platelets 150 - 400 K/uL 437  392  377       Latest Ref Rng & Units 04/04/2022    4:50 AM 04/01/2022    4:35 AM 03/31/2022    6:11 AM  CMP  Glucose 70 - 99 mg/dL 118  106  105   BUN 6 - 20 mg/dL '9  10  11   '$ Creatinine 0.44 - 1.00 mg/dL 0.49  0.52  0.46   Sodium 135 - 145 mmol/L 136  140  139   Potassium 3.5 - 5.1 mmol/L 4.5  4.4  4.3   Chloride 98 - 111 mmol/L 102  107  106   CO2 22 - 32 mmol/L '26  24  26   '$ Calcium 8.9 - 10.3 mg/dL 8.1  8.2  7.9   Total Protein 6.5 - 8.1 g/dL 6.3  5.9    Total Bilirubin 0.3 - 1.2 mg/dL 1.2  2.3    Alkaline Phos 38 - 126 U/L 54  76    AST 15 - 41 U/L 27  33    ALT 0 - 44 U/L 27  30       Imaging studies: No new imaging studies    Assessment/Plan:  36 y.o. female awaiting ROBF, 8 Days Post-Op s/p exploratory laparotomy and Hartman's procedure for perforated sigmoid colon.   - Pain control remains biggest  barrier; she has multimodal therapy as follows:    - Added Toradol 30 mg q6 back; stopped PO ibuprofen   - Stopped fentanyl; Added q4 PRN 0.5-1.0 mg Dilaudid for BREAKTHROUGH only   - Oxycodone 5-10 mg q4 PRN   - Lyrica 200 mg TID   - Also with IV valium 5 mg q6 PRN (for anxiety)   - Added lidocaine patches   - Continue FLD + nutritional supplementation - Continue TPN; monitor electrolytes - Continue IV Abx (meropenem); new Cx pending; ID on board   - Monitor colostomy function; this will likely get dusky; WOC RN following  - Wound Care: Wound Vac MWF; WOC RN assisting  - Removed surgical drain  - Continue IR drains; monitor and record output; flush  - Monitor abdominal examination - Monitor leukocytosis; improved  - Monitor H&H; stable - Mobilizing with therapies; recommendations are CIR - Further management per primary service  All of the above findings and recommendations were discussed with the patient, patient's family (mother-in-law at bedside), and the medical team,  and all of patient's and family's questions were answered to their expressed satisfaction.  -- [name], PA-C [facility] 04/04/2022, 7:34 AM M-F: 7am - 4pm

## 2022-04-05 DIAGNOSIS — T8143XA Infection following a procedure, organ and space surgical site, initial encounter: Secondary | ICD-10-CM | POA: Diagnosis not present

## 2022-04-05 LAB — BASIC METABOLIC PANEL
Anion gap: 6 (ref 5–15)
BUN: 16 mg/dL (ref 6–20)
CO2: 27 mmol/L (ref 22–32)
Calcium: 8.5 mg/dL — ABNORMAL LOW (ref 8.9–10.3)
Chloride: 103 mmol/L (ref 98–111)
Creatinine, Ser: 0.57 mg/dL (ref 0.44–1.00)
GFR, Estimated: >60 mL/min (ref >60)
Glucose, Bld: 131 mg/dL — ABNORMAL HIGH (ref 70–99)
Potassium: 4.6 mmol/L (ref 3.5–5.1)
Sodium: 136 mmol/L (ref 135–145)

## 2022-04-05 LAB — GLUCOSE, CAPILLARY
Glucose-Capillary: 125 mg/dL — ABNORMAL HIGH (ref 70–99)
Glucose-Capillary: 199 mg/dL — ABNORMAL HIGH (ref 70–99)
Glucose-Capillary: 135 mg/dL — ABNORMAL HIGH (ref 70–99)
Glucose-Capillary: 124 mg/dL — ABNORMAL HIGH (ref 70–99)

## 2022-04-05 LAB — PHOSPHORUS: Phosphorus: 5.1 mg/dL — ABNORMAL HIGH (ref 2.5–4.6)

## 2022-04-05 LAB — MAGNESIUM: Magnesium: 2.2 mg/dL (ref 1.7–2.4)

## 2022-04-05 MED ORDER — TRACE MINERALS CU-MN-SE-ZN 300-55-60-3000 MCG/ML IV SOLN
INTRAVENOUS | Status: AC
  Filled 2022-04-05: qty 326.4

## 2022-04-05 NOTE — Progress Notes (Signed)
Occupational Therapy Treatment Patient Details Name: Lacey Harrison MRN: [id number] DOB: 1985-10-07 Today's Date: 04/05/2022   History of present illness Pt is a 36 y.o. female presenting to hospital post op day 3 from robotic assisted laparoscopic surgery (lysis of adhesions, left salpingectomy, drainage of large peritoneal cyst, chromotubation 7/24) c/o sudden onset of severe abdominal pain.  CT drain placement into pelvic abscess 7/28.  Pt developed severe sepsis/septic shock after drainage of abdominal pelvic abscess and transferred to ICU.  Pt also noted with acute hypoxic respiratory distress and AKI; developing ileus.  S/p 8/2 exploratory laparotomy, sigmoid colectomy with colostomy creation, and Hartman's procedure for perforated sigmoid colon.  PMH includes smoking, anxiety, lyme disease, migraines, PNA, chromopertubation, cystectomy L, infertility, previous surgery for L ovarian cystectomy L for dermoid cyst complicated by cystotomy and bladder repair in 2007, pelvic pain (underwent 02/16/18 lap and extensive abdominopelvic adhesiolysis).   OT comments  Chart reviewed, pt greeted in bed, agreeable to OT tx session. Tx session targeted improving activity tolerance and improved independence in ADL completion. Pt able to amb in hallway with RW 250' with 4 standing rest breaks with CGA- supervision. Ostomy bag emptied with supervision and step by step vcs. Pt completed peri care with supervision in STS. Pt nauseous after mobility, RN present to assess. Pt continues to make improvements, discharge recommendation has been updated to [facility]. Recommend tub transfer bench when cleared to shower. OT will continue to follow acutely.    Recommendations for follow up therapy are one component of a multi-disciplinary discharge planning process, led by the attending physician.  Recommendations may be updated based on patient status, additional functional criteria and insurance authorization.    Follow Up  Recommendations  Home health OT    Assistance Recommended at Discharge Frequent or constant Supervision/Assistance  Patient can return home with the following  A little help with walking and/or transfers;A little help with bathing/dressing/bathroom   Equipment Recommendations  Tub/shower seat    Recommendations for Other Services      Precautions / Restrictions Precautions Precautions: Fall Precaution Comments: JP drain L abdomen, picc line, long abdominal incision; L UQ colostomy, drain on R side Restrictions Weight Bearing Restrictions: No       Mobility Bed Mobility Overal bed mobility: Needs Assistance Bed Mobility: Sidelying to Sit, Sit to Sidelying   Sidelying to sit: Supervision     Sit to sidelying: Supervision      Transfers Overall transfer level: Needs assistance Equipment used: Rolling walker (2 wheels) Transfers: Sit to/from Stand Sit to Stand: Supervision                 Balance Overall balance assessment: Needs assistance Sitting-balance support: Feet supported, Bilateral upper extremity supported Sitting balance-Leahy Scale: Good     Standing balance support: During functional activity, Reliant on assistive device for balance, Bilateral upper extremity supported Standing balance-Leahy Scale: Good                             ADL either performed or assessed with clinical judgement   ADL Overall ADL's : Needs assistance/impaired Eating/Feeding: Sitting;Set up   Grooming: Wash/dry hands;Standing;Supervision/safety               Lower Body Dressing: Sit to/from stand;Min guard Lower Body Dressing Details (indicate cue type and reason): underwear Toilet Transfer: Min guard;Rolling walker (2 wheels);Regular Toilet;Ambulation   Toileting- Clothing Manipulation and Hygiene: Supervision/safety;Sit to/from stand [place]  Manipulation Details (indicate cue type and reason): step by step vcs to empty ostomy      Functional mobility during ADLs: Supervision/safety;Minimal assistance;Rolling walker (2 wheels) (250')      Extremity/Trunk Assessment              Vision       Perception     Praxis      Cognition Arousal/Alertness: Awake/alert Behavior During Therapy: WFL for tasks assessed/performed, Anxious Overall Cognitive Status: Within Functional Limits for tasks assessed                                          Exercises      Shoulder Instructions       General Comments all lines/leads/drains intact following intervention    Pertinent Vitals/ Pain       Pain Assessment Pain Assessment: 0-10 Pain Score: 4  Pain Location: abdomen Pain Descriptors / Indicators: Discomfort, Grimacing, Guarding, Shooting Pain Intervention(s): Limited activity within patient's tolerance, Monitored during session, Repositioned, Premedicated before session  Home Living                                          Prior Functioning/Environment              Frequency  Min 3X/week        Progress Toward Goals  OT Goals(current goals can now be found in the care plan section)  Progress towards OT goals: Progressing toward goals     Plan Discharge plan needs to be updated    Co-evaluation                 AM-PAC OT "6 Clicks" Daily Activity     Outcome Measure   Help from another person eating meals?: None Help from another person taking care of personal grooming?: None Help from another person toileting, which includes using toliet, bedpan, or urinal?: A Little Help from another person bathing (including washing, rinsing, drying)?: A Lot Help from another person to put on and taking off regular upper body clothing?: A Little Help from another person to put on and taking off regular lower body clothing?: A Lot 6 Click Score: 18    End of Session Equipment Utilized During Treatment: Rolling walker (2 wheels)  OT Visit Diagnosis:  Muscle weakness (generalized) (M62.81);Unsteadiness on feet (R26.81)   Activity Tolerance Patient tolerated treatment well   Patient Left in bed;with call bell/phone within reach;with nursing/sitter in room   Nurse Communication Mobility status        Time: 1005-1045 OT Time Calculation (min): 40 min  Charges: OT General Charges $OT Visit: 1 Visit OT Treatments $Self Care/Home Management : 8-22 mins $Therapeutic Activity: 23-37 mins  Shanon Payor, OTD OTR/L  04/05/22, 10:59 AM

## 2022-04-05 NOTE — Progress Notes (Signed)
PROGRESS NOTE    Lacey Harrison   CHE:[id number] DOB: [birth year]  DOA: 03/21/2022 Date of Service: 04/05/22 PCP: Rubie Maid, MD     Brief Narrative / Hospital Course:  36 yo F presenting to [facility] ED from home on 03/21/22 with complaints of sudden onset severe abdominal pain. She recently on 03/18/22 had robotic adhesiolysis, left salpingectomy, drainage of 500 mL from large LEFT peritoneal cyst & chromotubation. She was discharged home on 5/36 without complication. She reported being on the floor with her cat and twisting her body around, with sudden acute abdominal pain. Patient complained of severe pain in lower abdomen radiating towards her vagina.  07/27: CT imaging abscess. Leukocytosis with left shift at 16.8. Initiated on IV Zosyn and Doxycycline. Admitted to OBGYN.  07/28: IR procedure 07/28 CT drain placement into pelvic abscess, left anterior approach, 12 Fr to bulb suction. Patient developed severe sepsis/septic shock.  She required transfer to stepdown unit, due to concerns for respiratory status PCCM consulted, receiving BS abx w/ zosyn, doxycycline & vancomycin, volume resuscitation w/ IVF, awaiting culture results   07/29: Remains in SDU w/ ileus and sepsis. IV antibiotics. Repeat CT abdomen pelvis , new loculations on the right, anasarca, distended urinary bladder. No contrast extravasation to suggest bowel perforation 07/30: Continues to have abdominal pain, required NG, abdomen less distended.  07/31: repeat CT The abscess is slightly decreased compared to prior exam. Continuing on Unasyn and Diflucan.  08/01: VSS, WBC trending down, BCx NGx4d. Ileus no better. Drain in place, minimal output. NG in place. PICC and TPN initiated. Spoke w/ GYN, plan for surgical consult and ID consult.  8/02: Patient continued to have persistently elevated white cell count with abdominal pain.  General surgery has scheduled exploratory laparotomy. 8/03: She  underwent Exploratory laparotomy,  sigmoid colectomy with colostomy creation, Hartmann procedure, lysis of additions.  Continue TPN and IV antibiotics. 08/04: Leukocytosis persist could be due to stress response.  Remains afebrile, continue NPO. and TPN, wound care consult, colostomy education. 08/05: NG tube removed, Foley removed.  Patient started on clear liquid diet, advance as tolerated. 08/07: WBC remains elevated, continues to have pain.  General surgery ordered CT abdomen and pelvis to rule out intra-abdominal process. 08/08: Repeat CT scan abdomen shows persistent pockets of fluid. IR performed CT-guided placement of drain x 2 into RLQ, LLQ/pelvis. 08/09: WBC improving, tolerating CLD trial FLD today.  08/10: tolerating FLD, pain control overnight was an issue, meds adjusted his AM per Gen Surg team, removed surgical drain, continue IR drains and wound VAC. Continue IV Abx (meropenem); new Cx pending (NGx12h); ID on board - plan for possible discontinuing antibiotics Monday 08/14.  08/11: Will start weaning TPN.    Consultants:  GYN PCCM IR General Surgery Infectious Disease   Procedures: 07/28 CT drain placement into pelvic abscess 07/30 NG tube placed  07/31 PICC line. 08/03 Exploratory laparotomy, sigmoid colectomy with colostomy creation, Hartmann procedure, lysis of additions 08/08: Repeat CT scan abdomen shows persistent pockets of fluid. IR performed CT-guided placement of drain x 2 into RLQ, LLQ/pelvis.    Subjective: Patient reports pain is better controlled this morning on the Dilaudid.  Working with PT.     ASSESSMENT & PLAN:   Principal Problem:   Postoperative intra-abdominal abscess Active Problems:   Acute respiratory failure with hypoxia (HCC) in seting of MSKpain, atelectasis, severe sepsis    Severe sepsis ([place]) without septic shock, d/t postoperative abscess, s/p pelvic drain placement    Ileus,  postoperative ([place])   AKI (acute kidney injury) ([place]) in setting of sepsis, IV contrast,  NAGMA - RESOLVED    Abdominal pain   INFECTIOUS DISEASE Severe sepsis ([place]) without septic shock - RESOLVED Postoperative intraabdominal abscess/peritonitis -  IMPROVING/STABLE 36 year old female with recent robotic lap for adhesiolysis and left salpingectomy and drainage of the left hydrosalpinx peritoneal cyst which was complicated by pelvic abscess secondary to sigmoid perforation. Initially underwent pelvic drain placement by IR.  Multiple organisms of colon origin and culture.  E. coli, Bacteroides, Clostridium perfringens and yeast. Patient underwent exploratory laparotomy and sigmoid colon resection with colostomy. Fecal peritonitis was present, she has been on Unasyn and fluconazole, changed to meropenem.  IR placed 2 additional drains 04/02/22.  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 03/28/22 Exploratory laparotomy, sigmoid colectomy with colostomy creation, Hartmann procedure, lysis of additions Advancing from clear liquid to full liquid diet Colostomy functioning 08/11 start to wean off TPN  General surgery following Bowel regimen PPI Pain control   RESPIRATORY Acute respiratory failure with hypoxia (HCC) in seting of MSKpain, atelectasis, severe sepsis - RESOLVED O2 prn  RENAL AKI (acute kidney injury) ([place]) in setting of sepsis, IV contrast, NAGMA - RESOLVED Serial BMP, Mg, Phos  CARDIOVASCULAR Stable  GENITOURINARY & REPRODUCTIVE Stable   NEUROLOGICAL & PSYCHIATRIC Stable Ambien prn   ENDOCRINE SSI  MUSCULOSKELETAL/RHEUM Stable Eval for inpatient rehab is underway but pending clinical course may be able to be discharged home.  DVT prophylaxis: lovenox Code Status: FULL Family Communication: pt declines call to familyat  this time  Disposition Plan / TOC needs: inpatient for now, may be able to go home w/ [facility] in a few days  Barriers to discharge / significant pending items: pending abx ID recommendations, transitioning to full oral diet, pain control, GenSurg and ID plan/clearance, will probably need IV antibiotics through Monday, 04/08/2022             Objective: Vitals:   04/04/22 1948 04/05/22 0327 04/05/22 0500 04/05/22 0754  BP: 106/63 (!) 92/59  109/68  Pulse: (!) 105 98  (!) 110  Resp: '16 16  18  '$ Temp: 98.5 F (36.9 C) 98.2 F (36.8 C)  98.4 F (36.9 C)  TempSrc: Oral Oral  Oral  SpO2: 95% 94%  93%  Weight:   98.2 kg   Height:        Intake/Output Summary (Last 24 hours) at 04/05/2022 7829 Last data filed at 04/05/2022 0800 Gross per 24 hour  Intake 1044.37 ml  Output 775 ml  Net 269.37 ml   Filed Weights   04/01/22 0429 04/02/22 0500 04/05/22 0500  Weight: 102.9 kg 101.6 kg 98.2 kg    Examination:  Constitutional:  VS as above General Appearance: alert, well-developed, well-nourished, NAD Eyes: Normal lids and conjunctive, non-icteric sclera Ears, Nose, Mouth, Throat: Normal external appearance MMM Neck: No masses, trachea midline Respiratory: Normal respiratory effort Cardiovascular: S1/S2 normal No lower extremity edema Musculoskeletal:  No clubbing/cyanosis of digits Symmetrical movement in all extremities Neurological: No cranial nerve deficit on limited exam Alert Psychiatric: Normal judgment/insight Normal mood and affect       Scheduled Medications:   Chlorhexidine Gluconate Cloth  6 each Topical Daily   cyclobenzaprine  10 mg Oral TID   enoxaparin (LOVENOX) injection  0.5 mg/kg Subcutaneous Q24H  feeding supplement  237 mL Oral TID BM   insulin aspart  0-15 Units Subcutaneous Q6H   insulin glargine-yfgn  10 Units Subcutaneous QHS   ketorolac  30 mg Intravenous Q6H   lidocaine  2 patch Transdermal Q24H   pantoprazole  40 mg Oral QHS    pregabalin  200 mg Oral TID   sodium chloride flush  10-40 mL Intracatheter Q12H   sodium chloride flush  5 mL Intracatheter Q8H    Continuous Infusions:  sodium chloride 10 mL/hr at 04/04/22 0211   fluconazole (DIFLUCAN) IV Stopped (04/04/22 1700)   meropenem (MERREM) IV 1 g (04/05/22 0517)   TPN ADULT (ION) 62 mL/hr at 04/05/22 0536   TPN ADULT (ION)      PRN Medications:  sodium chloride, albuterol, alum & mag hydroxide-simeth, bisacodyl, diazepam, diphenhydrAMINE, HYDROmorphone (DILAUDID) injection, iohexol, ondansetron **OR** ondansetron (ZOFRAN) IV, mouth rinse, oxyCODONE, simethicone, sodium chloride flush, zolpidem  Antimicrobials:  Anti-infectives (From admission, onward)    Start     Dose/Rate Route Frequency Ordered Stop   04/01/22 1500  meropenem (MERREM) 1 g in sodium chloride 0.9 % 100 mL IVPB        1 g 200 mL/hr over 30 Minutes Intravenous Every 8 hours 04/01/22 1404 04/08/22 2359   03/25/22 1600  Ampicillin-Sulbactam (UNASYN) 3 g in sodium chloride 0.9 % 100 mL IVPB  Status:  Discontinued        3 g 200 mL/hr over 30 Minutes Intravenous Every 6 hours 03/25/22 1134 04/01/22 1354   03/24/22 2200  clindamycin (CLEOCIN) IVPB 900 mg  Status:  Discontinued        900 mg 100 mL/hr over 30 Minutes Intravenous Every 8 hours 03/24/22 1854 03/25/22 1134   03/23/22 1500  fluconazole (DIFLUCAN) IVPB 400 mg        400 mg 100 mL/hr over 120 Minutes Intravenous Every 24 hours 03/23/22 1358 04/06/22 1359   03/22/22 1851  vancomycin variable dose per unstable renal function (pharmacist dosing)  Status:  Discontinued         Does not apply See admin instructions 03/22/22 1851 03/23/22 1051   03/22/22 1645  vancomycin (VANCOCIN) IVPB 1000 mg/200 mL premix  Status:  Discontinued        1,000 mg 200 mL/hr over 60 Minutes Intravenous  Once 03/22/22 1553 03/22/22 1558   03/22/22 1645  vancomycin (VANCOREADY) IVPB 2000 mg/400 mL        2,000 mg 200 mL/hr over 120 Minutes Intravenous   Once 03/22/22 1558 03/22/22 1814   03/22/22 0400  piperacillin-tazobactam (ZOSYN) IVPB 3.375 g  Status:  Discontinued        3.375 g 12.5 mL/hr over 240 Minutes Intravenous Every 8 hours 03/21/22 2047 03/25/22 1134   03/21/22 2200  doxycycline (VIBRAMYCIN) 100 mg in sodium chloride 0.9 % 250 mL IVPB  Status:  Discontinued        100 mg 125 mL/hr over 120 Minutes Intravenous Every 12 hours 03/21/22 2047 03/23/22 1358   03/21/22 1900  piperacillin-tazobactam (ZOSYN) IVPB 3.375 g        3.375 g 12.5 mL/hr over 240 Minutes Intravenous STAT 03/21/22 1853 03/21/22 2300       Data Reviewed: I have personally reviewed following labs and imaging studies  CBC: Recent Labs  Lab 03/31/22 0611 04/01/22 0435 04/02/22 0436 04/03/22 0525 04/04/22 0450  WBC 18.0* 19.6* 19.0* 13.7* 11.8*  HGB 8.8* 8.9* 8.5* 8.0* 8.0*  HCT 28.3* 28.5* 27.1* 25.9* 25.8*  MCV  91.3 90.2 91.6 92.2 91.2  PLT 275 319 377 392 161*   Basic Metabolic Panel: Recent Labs  Lab 03/30/22 0740 03/31/22 0611 04/01/22 0435 04/02/22 0436 04/04/22 0450 04/05/22 0518  NA 141 139 140  --  136 136  K 3.9 4.3 4.4  --  4.5 4.6  CL 106 106 107  --  102 103  CO2 '26 26 24  '$ --  26 27  GLUCOSE 127* 105* 106*  --  118* 131*  BUN '11 11 10  '$ --  9 16  CREATININE 0.48 0.46 0.52  --  0.49 0.57  CALCIUM 8.3* 7.9* 8.2*  --  8.1* 8.5*  MG  --  2.0 2.1  --  2.2 2.2  PHOS  --   --  5.2* 4.5 4.7* 5.1*   GFR: Estimated Creatinine Clearance: 108.5 mL/min (by C-G formula based on SCr of 0.57 mg/dL). Liver Function Tests: Recent Labs  Lab 04/01/22 0435 04/04/22 0450  AST 33 27  ALT 30 27  ALKPHOS 76 54  BILITOT 2.3* 1.2  PROT 5.9* 6.3*  ALBUMIN 2.0* 2.3*   No results for input(s): "LIPASE", "AMYLASE" in the last 168 hours. No results for input(s): "AMMONIA" in the last 168 hours. Coagulation Profile: Recent Labs  Lab 04/02/22 0436  INR 1.0   Cardiac Enzymes: No results for input(s): "CKTOTAL", "CKMB", "CKMBINDEX",  "TROPONINI" in the last 168 hours. BNP (last 3 results) No results for input(s): "PROBNP" in the last 8760 hours. HbA1C: No results for input(s): "HGBA1C" in the last 72 hours. CBG: Recent Labs  Lab 04/04/22 0508 04/04/22 1133 04/04/22 1709 04/04/22 2336 04/05/22 0507  GLUCAP 139* 162* 130* 174* 125*   Lipid Profile: Recent Labs    04/04/22 0450  TRIG 191*   Thyroid Function Tests: No results for input(s): "TSH", "T4TOTAL", "FREET4", "T3FREE", "THYROIDAB" in the last 72 hours. Anemia Panel: No results for input(s): "VITAMINB12", "FOLATE", "FERRITIN", "TIBC", "IRON", "RETICCTPCT" in the last 72 hours. Urine analysis:    Component Value Date/Time   COLORURINE RED (A) 03/24/2022 1802   APPEARANCEUR TURBID (A) 03/24/2022 1802   LABSPEC 1.045 (H) 03/24/2022 1802   PHURINE  03/24/2022 1802    TEST NOT REPORTED DUE TO COLOR INTERFERENCE OF URINE PIGMENT   GLUCOSEU (A) 03/24/2022 1802    TEST NOT REPORTED DUE TO COLOR INTERFERENCE OF URINE PIGMENT   HGBUR NEGATIVE 03/24/2022 1802   BILIRUBINUR (A) 03/24/2022 1802    TEST NOT REPORTED DUE TO COLOR INTERFERENCE OF URINE PIGMENT   KETONESUR (A) 03/24/2022 1802    TEST NOT REPORTED DUE TO COLOR INTERFERENCE OF URINE PIGMENT   PROTEINUR (A) 03/24/2022 1802    TEST NOT REPORTED DUE TO COLOR INTERFERENCE OF URINE PIGMENT   NITRITE (A) 03/24/2022 1802    TEST NOT REPORTED DUE TO COLOR INTERFERENCE OF URINE PIGMENT   LEUKOCYTESUR (A) 03/24/2022 1802    TEST NOT REPORTED DUE TO COLOR INTERFERENCE OF URINE PIGMENT   Sepsis Labs: '@LABRCNTIP'$ (procalcitonin:4,lacticidven:4)  Recent Results (from the past 240 hour(s))  Aerobic/Anaerobic Culture w Gram Stain (surgical/deep wound)     Status: None (Preliminary result)   Collection Time: 04/02/22  1:00 PM   Specimen: Abscess  Result Value Ref Range Status   Specimen Description ABSCESS  Final   Special Requests RLQ ABDOMINAL ABSCESS  Final   Gram Stain   Final    ABUNDANT WBC  PRESENT,BOTH PMN AND MONONUCLEAR NO ORGANISMS SEEN    Culture   Final    NO  GROWTH 2 DAYS NO ANAEROBES ISOLATED; CULTURE IN PROGRESS FOR 5 DAYS Performed at [facility], [place] [address] [place], [place] [address]    Report Status PENDING  Incomplete  Aerobic/Anaerobic Culture w Gram Stain (surgical/deep wound)     Status: None (Preliminary result)   Collection Time: 04/02/22  1:01 PM   Specimen: Abscess  Result Value Ref Range Status   Specimen Description ABSCESS  Final   Special Requests LLQ ABDOMINAL ABSCESS  Final   Gram Stain   Final    ABUNDANT WBC PRESENT,BOTH PMN AND MONONUCLEAR NO ORGANISMS SEEN    Culture   Final    NO GROWTH 2 DAYS NO ANAEROBES ISOLATED; CULTURE IN PROGRESS FOR 5 DAYS Performed at [facility], [place] [address] [place], [place] [address]    Report Status PENDING  Incomplete         Radiology Studies: DG Chest 2 View  Result Date: 03/22/2022 CLINICAL DATA:  Shortness of breath. EXAM: CHEST - 2 VIEW COMPARISON:  Two-view chest x-ray 07/26/2016.  CT chest 03/21/2022 FINDINGS: Low lung volumes exaggerate the heart size. Left lower lobe airspace opacities are again noted. No other significant airspace disease is present. IMPRESSION: Persistent left lower lobe airspace disease concerning for pneumonia. Electronically Signed   By: San Morelle M.D.   On: 03/22/2022 21:46   CT IMAGE GUIDED DRAINAGE PERCUT CATH  PERITONEAL RETROPERIT  Result Date: 03/22/2022 INDICATION: Abdominopelvic abscess EXAM: CT-guided placement of drainage catheter into abdominopelvic abscess TECHNIQUE: Multidetector CT imaging of the abdomen and pelvis was performed following the standard protocol without IV contrast. RADIATION DOSE REDUCTION: This exam was performed according to the departmental dose-optimization program which includes automated exposure control, adjustment of the mA and/or kV according to patient size and/or use of iterative reconstruction  technique. MEDICATIONS: Per EMR ANESTHESIA/SEDATION: Local analgesia COMPLICATIONS: None immediate. PROCEDURE: Informed written consent was obtained from the patient after a thorough discussion of the procedural risks, benefits and alternatives. All questions were addressed. Maximal Sterile Barrier Technique was utilized including caps, mask, sterile gowns, sterile gloves, sterile drape, hand hygiene and skin antiseptic. A timeout was performed prior to the initiation of the procedure. The patient was placed supine on the exam table. Limited CT of the abdomen and pelvis was performed for planning purposes. This again demonstrated an air in fluid collection in the lower abdomen and pelvis. Skin entry site was marked with a planned anterolateral approach from the left lower quadrant. The overlying skin was prepped and draped in a standard sterile fashion. Local analgesia was obtained with 1% lidocaine. Using intermittent CT fluoroscopy, an 18 gauge trocar needle was advanced towards the identified abdominopelvic fluid collection. Access was confirmed with CT and return of purulent material. An 035 wire was then advanced through the access needle, over which the percutaneous tract was serially dilated to accommodate a 12 [place] multipurpose locking drainage catheter. Location was again confirmed with CT and return of additional purulent material. Catheter was secured to the skin using silk suture and a dressing. It was attached to bulb suction. The patient tolerated the procedure well without immediate complication. IMPRESSION: Successful CT-guided placement of a 12 French locking drainage catheter into the lower abdominopelvic abscess. Sample sent to the lab for microbiology analysis. Electronically Signed   By: Albin Felling M.D.   On: 03/22/2022 15:12   CT ABDOMEN PELVIS W CONTRAST  Result Date: 03/21/2022 CLINICAL DATA:  Abdominal pain, post-op Abdominal pain, acute, nonlocalized. Robotic salpingectomy  03/18/2022 EXAM: CT ABDOMEN AND PELVIS WITH CONTRAST TECHNIQUE: Multidetector CT imaging of the abdomen and pelvis was performed using the standard protocol following bolus administration of intravenous contrast. RADIATION DOSE REDUCTION: This exam was performed according to the departmental dose-optimization program which includes automated exposure control, adjustment of the mA and/or kV according to patient size and/or use of iterative reconstruction technique. CONTRAST:  17m OMNIPAQUE IOHEXOL 300 MG/ML  SOLN COMPARISON:  None Available. FINDINGS: Lower chest: Left base atelectasis.  No acute abnormality. Hepatobiliary: Liver is enlarged measuring up to 19 cm. The hepatic parenchyma is diffusely hypodense compared to the splenic parenchyma consistent with fatty infiltration. No focal liver abnormality. No gallstones, gallbladder wall thickening, or pericholecystic fluid. No biliary dilatation. Pancreas: No focal lesion. Normal pancreatic contour. No surrounding inflammatory changes. No main pancreatic ductal dilatation. Spleen: Normal in size without focal abnormality. Adrenals/Urinary Tract: No adrenal nodule bilaterally. Bilateral kidneys enhance symmetrically. No hydronephrosis. No hydroureter. The urinary bladder is unremarkable. On delayed imaging, there is no urothelial wall thickening and there are no filling defects in the opacified portions of the bilateral collecting systems or ureters as well as urinary bladder. Stomach/Bowel: Stomach is within normal limits. No evidence of bowel wall thickening or dilatation. Appendix appears normal. Vascular/Lymphatic: No abdominal aorta or iliac aneurysm. No abdominal, pelvic, or inguinal lymphadenopathy. Reproductive: Uterus is unremarkable no adnexal mass identified. Other: No intraperitoneal free fluid. Small volume free intraperitoneal gas. There is a gas and fluid collection with peripheral enhancement measuring approximately 8 x 9 x 7.5 cm centered within  the left rectouterine pouch. Musculoskeletal: No abdominal wall hernia or abnormality. No suspicious lytic or blastic osseous lesions. No acute displaced fracture. IMPRESSION: 1. An 8 x 9 x 7.5cm gas and fluid collection centered within the left rectouterine pouch likely represents a developing abscess. No findings to suggest bowel injury; however, this is not fully excluded. If a CT is repeated in the near future, consider use of IV and PO contrast. 2. Small volume pneumoperitoneum likely postsurgical in etiology. 3. Hepatomegaly and hepatic steatosis. Electronically Signed   By: MIven FinnM.D.   On: 03/21/2022 18:43            LOS: 15 days      NEmeterio Reeve DO [facility] 04/05/2022, 9:27 AM   Staff may message me via secure chat in [place] but this may not receive immediate response,  please page for urgent matters!  If 7PM-7AM, please contact night-coverage [email or web address]  Dictation software was used to generate the above note. Typos may occur and escape review, as with typed/written notes. Please contact Dr ASheppard Coildirectly for clarity if needed.

## 2022-04-05 NOTE — TOC Progression Note (Signed)
Transition of Care [facility]) - Progression Note    Patient Details  Name: Lacey Harrison MRN: [id number] Date of Birth: [birth year]  [facility] CM/SW Contact  Beverly Sessions, RN Phone Number: 04/05/2022, 3:49 PM  Clinical Narrative:    Therapy upgrading patient to home therapy Met with patient at bedside Per MD anticipated that patient will need vac and drains at discharge.  Could potentially change to wet to dry dressing  Patient agreeable to home health.  Patient aware that due to her insurance I may not be able to secure home health services.  - [facility], [facility], [facility], [facility], and [facility] can not accept - awaiting response from [facility], and [facility]    Patient aware that we are unable to arrange home health she will likely need to discharge home with wet to dry dressing and patient/family will be responsible changing, set up with the ostomy clinic in [place], and outpatient PT and OT  Patient is to update her husband, and her mother who is a Marine scientist    Expected Discharge Plan:  (TBD) Barriers to Discharge: Continued Medical Work up  Expected Discharge Plan and Services Expected Discharge Plan:  (TBD)       Living arrangements for the past 2 months: Single Family Home                                       Social Determinants of Health (SDOH) Interventions    Readmission Risk Interventions     No data to display

## 2022-04-05 NOTE — Progress Notes (Signed)
POD # 9  Better today. Added lidocaine patch , went up on lyrica Liquid brown stool in bag Labs ok Drain 75 cc combined  PE NAD Abd: soft, appropriate incisional tenderness,, ostomy much better, stool in the bag, wound vac in place. Two drains w serous outpt  A/P  Ileus resolved Advance to regular diet A/bs to stop Monday Wean TPN Mobilize CT Monday to check feasibility of removing drains before DC Keep wound vac May need TAP block by anesthesia if pain worsens

## 2022-04-05 NOTE — Progress Notes (Signed)
End of shift note:  Pt was given prn PO Roxicodone and prn IV zofran. Left side Jp's output was 7 cc during this shift. Biliary drain was flushed as schedule. Pt continues on TPN and IV antibiotics. Colostomy was emptied. VSS\.

## 2022-04-05 NOTE — Progress Notes (Signed)
GYNECOLOGY INPATIENT PROGRESS NOTE POD #17 s/p robotic assisted laparosocopic lysis of adhesions, left salpingectomy, drainage of large peritoneal cyst, chromotubation Hospital Day # 14, readmitted for suspected pelvic abscess, developing sepsis.   POD#13 s/p CT guided drainage of pelvic abscess POD# 9, s/p exploratory laparotomy, colectomy with Hartmann's procedure POD# 3, s/p CT guided drainage of new abdominal fluid collection (infrahepatic)  Subjective: Patient doing much better today. Excited as she recently had her first bowel movement. Also notes that some of the abdominal pain that she had been having has improved.    Objective: Temp:  [98.2 F (36.8 C)-98.6 F (37 C)] 98.2 F (36.8 C) (08/11 0327) Pulse Rate:  [96-125] 98 (08/11 0327) Resp:  [16-18] 16 (08/11 0327) BP: (92-128)/(59-63) 92/59 (08/11 0327) SpO2:  [94 %-96 %] 94 % (08/11 0327) Weight:  [98.2 kg] 98.2 kg (08/11 0500)  I/O last 3 completed shifts: In: 1419.2 [P.O.:237; I.V.:926.5; Other:55; IV Piggyback:200.7] Out: 355 [Drains:305; Stool:50] Total I/O In: -  Out: 50 [Drains:50]    sodium chloride 10 mL/hr at 04/04/22 0211   fluconazole (DIFLUCAN) IV Stopped (04/04/22 1700)   meropenem (MERREM) IV 1 g (04/05/22 0517)   TPN ADULT (ION) 62 mL/hr at 04/05/22 0536      Chlorhexidine Gluconate Cloth  6 each Topical Daily   cyclobenzaprine  10 mg Oral TID   enoxaparin (LOVENOX) injection  0.5 mg/kg Subcutaneous Q24H   feeding supplement  237 mL Oral TID BM   insulin aspart  0-15 Units Subcutaneous Q6H   insulin glargine-yfgn  10 Units Subcutaneous QHS   ketorolac  30 mg Intravenous Q6H   lidocaine  2 patch Transdermal Q24H   pantoprazole  40 mg Oral QHS   pregabalin  200 mg Oral TID   sodium chloride flush  10-40 mL Intracatheter Q12H   sodium chloride flush  5 mL Intracatheter Q8H    Physical Exam:  General: alert and moderate distress  Abdomen: deferred as exam just completed by General  Surgery prior to my entrance.  Pelvis: Deferred. Patient reports she is on her menses.  Extremities: DVT Evaluation: No evidence of DVT seen on physical exam. SCDs in place. Left arm    Labs:     Latest Ref Rng & Units 04/04/2022    4:50 AM 04/03/2022    5:25 AM 04/02/2022    4:36 AM  CBC  WBC 4.0 - 10.5 K/uL 11.8  13.7  19.0   Hemoglobin 12.0 - 15.0 g/dL 8.0  8.0  8.5   Hematocrit 36.0 - 46.0 % 25.8  25.9  27.1   Platelets 150 - 400 K/uL 437  392  377         Latest Ref Rng & Units 04/05/2022    5:18 AM 04/04/2022    4:50 AM 04/01/2022    4:35 AM  CMP  Glucose 70 - 99 mg/dL 131  118  106   BUN 6 - 20 mg/dL '16  9  10   '$ Creatinine 0.44 - 1.00 mg/dL 0.57  0.49  0.52   Sodium 135 - 145 mmol/L 136  136  140   Potassium 3.5 - 5.1 mmol/L 4.6  4.5  4.4   Chloride 98 - 111 mmol/L 103  102  107   CO2 22 - 32 mmol/L '27  26  24   '$ Calcium 8.9 - 10.3 mg/dL 8.5  8.1  8.2   Total Protein 6.5 - 8.1 g/dL  6.3  5.9   Total  Bilirubin 0.3 - 1.2 mg/dL  1.2  2.3   Alkaline Phos 38 - 126 U/L  54  76   AST 15 - 41 U/L  27  33   ALT 0 - 44 U/L  27  30      Assessment/Plan: - Continuing post-operative management per General Surgery. Continue current pain management.   - Currently receiving TPN, also full liquid diet.  - Continue PT/OT.  - Antibiotics:per ID. WBC's trending downward.     - Hospitalist currently also on board for general management.  - Will continue to follow as GYN.     A total of 25 minutes were spent during this encounter, including review of previous progress notes, recent imaging and labs, face-to-face with time with patient involving counseling and coordination of care, as well as documentation for current visit.   Rubie Maid, MD [facility]

## 2022-04-05 NOTE — Progress Notes (Signed)
PT Cancellation Note  Patient Details Name: Lacey Harrison MRN: [id number] DOB: 08-Jan-1986   Cancelled Treatment:     PT attempt. Pt politely refused. I was up with OT walking not long ago. Author will return later this date and continue to follow per current POC.    Willette Pa 04/05/2022, 11:12 AM

## 2022-04-05 NOTE — Consult Note (Signed)
PHARMACY - TOTAL PARENTERAL NUTRITION CONSULT NOTE   Indication: Prolonged ileus  Patient Measurements: Height: '5\' 3"'$  (160 cm) Weight: 98.2 kg (216 lb 7.9 oz) IBW/kg (Calculated) : 52.4 TPN AdjBW (KG): 62.1 Body mass index is 38.35 kg/m.  Assessment:  Patient is [name] with medical history including recent laparoscopic adhesiolysis, left salpingectomy, drainage of peritoneal cyst, and chromotubation on 7/24 who presented to the ED 7/27 with severe pain in abdomen and vagina. Imaging concerning for post-operative abscess. Patient underwent CT drain placement into pelvic abscess on 7/28. Hospital course has been complicated by ileus. Pharmacy consulted to initiate TPN to provide nutrition given post-operative ileus.   Glucose / Insulin: BG 131 requiring 10 units insulin aspart SSI in the past 24 hours.  Insulin Glargine 10u HS added 8/3 Electrolytes: Na 136, K 4.5, Cl 102, Corrected Ca 9.5, Phos 4.7 > 5.1, Mg 2.2 Renal: Scr 0.49 Hepatic: LFTs remains WNL, Hyperbilirubinemia resolved Intake / Output; MIVF: JP drain left abdomen, colostomy LUQ. Drain with minimal output. Net: + 20.2L GI Imaging: 7/31 CTAP: Percutaneous drainage catheter is seen in left-sided abdominal abscess. Mild proximal small bowel dilatation is noted which is slightly improved compared to prior exam, most consistent with ileus. Stable mild ascites is noted in the pelvis and right side of the abdomen.  GI Surgeries / Procedures:  7/28: CT drain placement into pelvic abscess 8/2: ExLap with general surgery  Central access: PICC Double Lumen placed 8/1 TPN start date: 8/1   Nutritional Goals: Goal TPN rate is 75 mL/hr (provides 108 g of protein and 2217 kcals per day)  RD Assessment: Estimated Needs Total Energy Estimated Needs: 2000-2300kcal/day Total Protein Estimated Needs: 100-115g/day Total Fluid Estimated Needs: 1.6-1.8L/day  Current Nutrition:  Full liquids and TPN  Plan:  Will start weaning TPN  to 30 ml/hr as colostomy is working now and pt is now on liquid diet. (This bag will provide 965 kcl) Protein 48.96 g Dextrose 144 g Lipids 28 g.  Electrolytes in TPN adjusted to: Na 73mq/L, K 339m/L, Ca 48m33mL, Mg 5 mEq/L, and Phos 0 mmol/L. Cl:Ac 1:1 K in TPN decreased to 30 mEq/L on 8/10 due to increasing K. K today 4.5 Phos in TPN deceased to 0 mmol/L on 8/109-06-2024e to mild hyperphosphatemia with normal renal function.   Add standard MVI and trace elements to TPN Thiamine 100 mg x 3 days (completed) Continue Moderate q6h SSI and adjust as needed  Continue insulin glargine 10 units at bedtime Monitor TPN labs on Mon/Thurs  KisEleonore ChiquitoharmD PGY1 Pharmacy Resident 8/109/01/2305 AM

## 2022-04-05 NOTE — Progress Notes (Signed)
Inpatient Rehab Admissions Coordinator:   Note both PT and OT have updated recommendations to [facility] as pt progressing very well.  CIR will sign off at this time, but if pt's needs change, please feel free to contact me.    Shann Medal, PT, DPT Admissions Coordinator [phone] 04/05/22  11:12 AM

## 2022-04-05 NOTE — Consult Note (Addendum)
[facility] Nurse Consult Note: Reason for Consult: Vac dressing change performed.   Wound type: Full thickness post-op wound to midline abd Pt was medicated for pain prior to the procedure and tolerated with mod amt discomfort.  Removed 1 piece black sponge.  Wound is 100% beefy red with small amt bloody drainage.  Refer to previous notes for measurements. Applied barrier ring pieces to lower and middle wound edges to attempt to maintain a seal, then applied 1 piece black foam to 156m cont suction.  [place] will plan to change dressing again on Mon.   [place] ost[place] consult note Husband and mother at the bedside for pouch change and teaching session.  Pt got up to the bathroom and pouch filled with 600cc liquid brown stool.  Stoma is 85% red, 15% dusky, 1 1/8 inches and slightly oval, slightly above skin level.  Pt and husband were able to cut pattern, apply barrier ring to attempt to maintain a seal, and apply 2 piece pouching system; pt used hand-held mirror to visualize.   Pt was able to open and close the velcro to empty.  5 sets of supplies left at bedside; use wafer LKellie Simmering#[id number], pouch Lawson # [id number], and barrier ring LKellie Simmering# [id number]  Educational materials in the room.  Reviewed pouching routines and ordering supplies.  [place], previously Thank-you,  DJulien GirtMSN, [place]

## 2022-04-05 NOTE — Plan of Care (Signed)
  Problem: Clinical Measurements: Goal: Signs and symptoms of infection will decrease Outcome: Not Progressing   Problem: Skin Integrity: Goal: Demonstration of wound healing without infection will improve Outcome: Not Progressing   Problem: Pain Managment: Goal: General experience of comfort will improve Outcome: Not Progressing

## 2022-04-06 DIAGNOSIS — T8143XA Infection following a procedure, organ and space surgical site, initial encounter: Secondary | ICD-10-CM | POA: Diagnosis not present

## 2022-04-06 LAB — GLUCOSE, CAPILLARY
Glucose-Capillary: 111 mg/dL — ABNORMAL HIGH (ref 70–99)
Glucose-Capillary: 119 mg/dL — ABNORMAL HIGH (ref 70–99)
Glucose-Capillary: 122 mg/dL — ABNORMAL HIGH (ref 70–99)
Glucose-Capillary: 103 mg/dL — ABNORMAL HIGH (ref 70–99)
Glucose-Capillary: 110 mg/dL — ABNORMAL HIGH (ref 70–99)

## 2022-04-06 MED ORDER — IOHEXOL 12 MG/ML PO SOLN: 500.0000 mL | ORAL | Status: AC

## 2022-04-06 NOTE — Progress Notes (Signed)
PROGRESS NOTE    Lacey Harrison   JJK:[id number] DOB: May 09, 1986  DOA: 03/21/2022 Date of Service: 04/06/22 PCP: Rubie Maid, MD     Brief Narrative / Hospital Course:  36 yo F presenting to [facility] ED from home on 03/21/22 with complaints of sudden onset severe abdominal pain. She recently on 03/18/22 had robotic adhesiolysis, left salpingectomy, drainage of 500 mL from large LEFT peritoneal cyst & chromotubation. She was discharged home on 3/71 without complication. She reported being on the floor with her cat and twisting her body around, with sudden acute abdominal pain. Patient complained of severe pain in lower abdomen radiating towards her vagina.  07/27: CT imaging abscess. Leukocytosis with left shift at 16.8. Initiated on IV Zosyn and Doxycycline. Admitted to OBGYN.  07/28: IR procedure 07/28 CT drain placement into pelvic abscess, left anterior approach, 12 Fr to bulb suction. Patient developed severe sepsis/septic shock.  She required transfer to stepdown unit, due to concerns for respiratory status PCCM consulted, receiving BS abx w/ zosyn, doxycycline & vancomycin, volume resuscitation w/ IVF, awaiting culture results   07/29: Remains in SDU w/ ileus and sepsis. IV antibiotics. Repeat CT abdomen pelvis , new loculations on the right, anasarca, distended urinary bladder. No contrast extravasation to suggest bowel perforation 07/30: Continues to have abdominal pain, required NG, abdomen less distended.  07/31: repeat CT The abscess is slightly decreased compared to prior exam. Continuing on Unasyn and Diflucan.  08/01: VSS, WBC trending down, BCx NGx4d. Ileus no better. Drain in place, minimal output. NG in place. PICC and TPN initiated. Spoke w/ GYN, plan for surgical consult and ID consult.  8/02: Patient continued to have persistently elevated white cell count with abdominal pain.  General surgery has scheduled exploratory laparotomy. 8/03: She  underwent Exploratory laparotomy,  sigmoid colectomy with colostomy creation, Hartmann procedure, lysis of additions.  Continue TPN and IV antibiotics. 08/04: Leukocytosis persist could be due to stress response.  Remains afebrile, continue NPO. and TPN, wound care consult, colostomy education. 08/05: NG tube removed, Foley removed.  Patient started on clear liquid diet, advance as tolerated. 08/07: WBC remains elevated, continues to have pain.  General surgery ordered CT abdomen and pelvis to rule out intra-abdominal process. 08/08: Repeat CT scan abdomen shows persistent pockets of fluid. IR performed CT-guided placement of drain x 2 into RLQ, LLQ/pelvis. 08/09: WBC improving, tolerating CLD trial FLD today.  08/10: tolerating FLD, pain control overnight was an issue, meds adjusted his AM per Gen Surg team, removed surgical drain, continue IR drains and wound VAC. Continue IV Abx (meropenem); new Cx pending (NGx12h); ID on board - plan for possible discontinuing antibiotics Monday 08/14.  08/11-08/12: weaning TPN and try to control pain on po meds    Consultants:  GYN PCCM IR General Surgery Infectious Disease   Procedures: 07/28 CT drain placement into pelvic abscess 07/30 NG tube placed  07/31 PICC line. 08/03 Exploratory laparotomy, sigmoid colectomy with colostomy creation, Hartmann procedure, lysis of additions 08/08: Repeat CT scan abdomen shows persistent pockets of fluid. IR performed CT-guided placement of drain x 2 into RLQ, LLQ/pelvis.    Subjective: Patient reports pain is better controlled this morning on the Dilaudid.  Working with PT.     ASSESSMENT & PLAN:   Principal Problem:   Postoperative intra-abdominal abscess Active Problems:   Acute respiratory failure with hypoxia (HCC) in seting of MSKpain, atelectasis, severe sepsis    Severe sepsis ([place]) without septic shock, d/t postoperative abscess, s/p pelvic  drain placement    Ileus, postoperative (HCC)   AKI (acute kidney injury) ([place]) in  setting of sepsis, IV contrast, NAGMA - RESOLVED    Abdominal pain   INFECTIOUS DISEASE Severe sepsis ([place]) without septic shock - RESOLVED Postoperative intraabdominal abscess/peritonitis -  IMPROVING/STABLE 36 year old female with recent robotic lap for adhesiolysis and left salpingectomy and drainage of the left hydrosalpinx peritoneal cyst which was complicated by pelvic abscess secondary to sigmoid perforation. Initially underwent pelvic drain placement by IR.  Multiple organisms of colon origin and culture.  E. coli, Bacteroides, Clostridium perfringens and yeast. Patient underwent exploratory laparotomy and sigmoid colon resection with colostomy. Fecal peritonitis was present, she has been on Unasyn and fluconazole, changed to meropenem.  IR placed 2 additional drains 04/02/22.  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 03/28/22 Exploratory laparotomy, sigmoid colectomy with colostomy creation, Hartmann procedure, lysis of additions Advancing from clear liquid to full liquid diet Colostomy functioning 08/11 start to wean TPN  General surgery following Bowel regimen PPI Pain control try for po meds in anticipation of d/c home next few days   RESPIRATORY Acute respiratory failure with hypoxia (HCC) in seting of MSKpain, atelectasis, severe sepsis - RESOLVED O2 prn  RENAL AKI (acute kidney injury) ([place]) in setting of sepsis, IV contrast, NAGMA - RESOLVED Serial BMP, Mg, Phos  CARDIOVASCULAR Stable  GENITOURINARY & REPRODUCTIVE Stable   NEUROLOGICAL & PSYCHIATRIC Stable Ambien prn   ENDOCRINE SSI  MUSCULOSKELETAL/RHEUM Stable Eval for inpatient rehab is underway but pending clinical course may be able to be discharged home.  DVT  prophylaxis: lovenox Code Status: FULL Family Communication: pt declines call to familyat this time  Disposition Plan / TOC needs: inpatient for now, may be able to go home w/ [facility] in a few days  Barriers to discharge / significant pending items: pending abx ID recommendations, transitioning to oral diet, pain control, GenSurg and ID plan/clearance, will probably need IV antibiotics through Monday, 04/08/2022 but hoping for discharge early this week             Objective: Vitals:   04/05/22 2052 04/06/22 0511 04/06/22 0601 04/06/22 0818  BP:  99/65  99/64  Pulse:  97  94  Resp:  16  16  Temp: 97.6 F (36.4 C) 98 F (36.7 C)  98.4 F (36.9 C)  TempSrc:  Oral  Oral  SpO2:  96%  93%  Weight:   97.4 kg   Height:        Intake/Output Summary (Last 24 hours) at 04/06/2022 1357 Last data filed at 04/06/2022 0541 Gross per 24 hour  Intake 1100.58 ml  Output 222 ml  Net 878.58 ml   Filed Weights   04/02/22 0500 04/05/22 0500 04/06/22 0601  Weight: 101.6 kg 98.2 kg 97.4 kg    Examination:  Constitutional:  VS as above General Appearance: alert, well-developed, well-nourished, NAD Eyes: Normal lids and conjunctive, non-icteric sclera Ears, Nose, Mouth, Throat: Normal external appearance MMM Neck: No masses, trachea midline Respiratory: Normal respiratory effort Cardiovascular: S1/S2 normal No lower extremity edema Musculoskeletal:  No clubbing/cyanosis of digits Symmetrical movement in all extremities Neurological: No cranial nerve deficit on limited exam Alert Psychiatric: Normal judgment/insight Normal mood and affect       Scheduled Medications:   Chlorhexidine Gluconate Cloth  6  each Topical Daily   cyclobenzaprine  10 mg Oral TID   enoxaparin (LOVENOX) injection  0.5 mg/kg Subcutaneous Q24H   feeding supplement  237 mL Oral TID BM   insulin aspart  0-15 Units Subcutaneous Q6H   insulin glargine-yfgn  10 Units Subcutaneous QHS   ketorolac  30 mg  Intravenous Q6H   lidocaine  2 patch Transdermal Q24H   pantoprazole  40 mg Oral QHS   pregabalin  200 mg Oral TID   sodium chloride flush  10-40 mL Intracatheter Q12H   sodium chloride flush  5 mL Intracatheter Q8H    Continuous Infusions:  sodium chloride 10 mL/hr at 04/06/22 0057   fluconazole (DIFLUCAN) IV Stopped (04/05/22 1631)   meropenem (MERREM) IV 1 g (04/06/22 0839)   TPN ADULT (ION) 30 mL/hr at 04/06/22 0057    PRN Medications:  sodium chloride, albuterol, alum & mag hydroxide-simeth, bisacodyl, diazepam, diphenhydrAMINE, HYDROmorphone (DILAUDID) injection, iohexol, ondansetron **OR** ondansetron (ZOFRAN) IV, mouth rinse, oxyCODONE, simethicone, sodium chloride flush, zolpidem  Antimicrobials:  Anti-infectives (From admission, onward)    Start     Dose/Rate Route Frequency Ordered Stop   04/01/22 1500  meropenem (MERREM) 1 g in sodium chloride 0.9 % 100 mL IVPB        1 g 200 mL/hr over 30 Minutes Intravenous Every 8 hours 04/01/22 1404 04/09/22 0759   03/25/22 1600  Ampicillin-Sulbactam (UNASYN) 3 g in sodium chloride 0.9 % 100 mL IVPB  Status:  Discontinued        3 g 200 mL/hr over 30 Minutes Intravenous Every 6 hours 03/25/22 1134 04/01/22 1354   03/24/22 2200  clindamycin (CLEOCIN) IVPB 900 mg  Status:  Discontinued        900 mg 100 mL/hr over 30 Minutes Intravenous Every 8 hours 03/24/22 1854 03/25/22 1134   03/23/22 1500  fluconazole (DIFLUCAN) IVPB 400 mg        400 mg 100 mL/hr over 120 Minutes Intravenous Every 24 hours 03/23/22 1358 04/06/22 1359   03/22/22 1851  vancomycin variable dose per unstable renal function (pharmacist dosing)  Status:  Discontinued         Does not apply See admin instructions 03/22/22 1851 03/23/22 1051   03/22/22 1645  vancomycin (VANCOCIN) IVPB 1000 mg/200 mL premix  Status:  Discontinued        1,000 mg 200 mL/hr over 60 Minutes Intravenous  Once 03/22/22 1553 03/22/22 1558   03/22/22 1645  vancomycin (VANCOREADY) IVPB 2000  mg/400 mL        2,000 mg 200 mL/hr over 120 Minutes Intravenous  Once 03/22/22 1558 03/22/22 1814   03/22/22 0400  piperacillin-tazobactam (ZOSYN) IVPB 3.375 g  Status:  Discontinued        3.375 g 12.5 mL/hr over 240 Minutes Intravenous Every 8 hours 03/21/22 2047 03/25/22 1134   03/21/22 2200  doxycycline (VIBRAMYCIN) 100 mg in sodium chloride 0.9 % 250 mL IVPB  Status:  Discontinued        100 mg 125 mL/hr over 120 Minutes Intravenous Every 12 hours 03/21/22 2047 03/23/22 1358   03/21/22 1900  piperacillin-tazobactam (ZOSYN) IVPB 3.375 g        3.375 g 12.5 mL/hr over 240 Minutes Intravenous STAT 03/21/22 1853 03/21/22 2300       Data Reviewed: I have personally reviewed following labs and imaging studies  CBC: Recent Labs  Lab 03/31/22 0611 04/01/22 0435 04/02/22 0436 04/03/22 0525 04/04/22 0450  WBC 18.0* 19.6* 19.0* 13.7* 11.8*  HGB 8.8* 8.9* 8.5* 8.0* 8.0*  HCT 28.3* 28.5* 27.1* 25.9* 25.8*  MCV 91.3 90.2 91.6 92.2 91.2  PLT 275 319 377 392 563*   Basic Metabolic Panel: Recent Labs  Lab 03/31/22 0611 04/01/22 0435 04/02/22 0436 04/04/22 0450 04/05/22 0518  NA 139 140  --  136 136  K 4.3 4.4  --  4.5 4.6  CL 106 107  --  102 103  CO2 26 24  --  26 27  GLUCOSE 105* 106*  --  118* 131*  BUN 11 10  --  9 16  CREATININE 0.46 0.52  --  0.49 0.57  CALCIUM 7.9* 8.2*  --  8.1* 8.5*  MG 2.0 2.1  --  2.2 2.2  PHOS  --  5.2* 4.5 4.7* 5.1*   GFR: Estimated Creatinine Clearance: 108 mL/min (by C-G formula based on SCr of 0.57 mg/dL). Liver Function Tests: Recent Labs  Lab 04/01/22 0435 04/04/22 0450  AST 33 27  ALT 30 27  ALKPHOS 76 54  BILITOT 2.3* 1.2  PROT 5.9* 6.3*  ALBUMIN 2.0* 2.3*   No results for input(s): "LIPASE", "AMYLASE" in the last 168 hours. No results for input(s): "AMMONIA" in the last 168 hours. Coagulation Profile: Recent Labs  Lab 04/02/22 0436  INR 1.0   Cardiac Enzymes: No results for input(s): "CKTOTAL", "CKMB", "CKMBINDEX",  "TROPONINI" in the last 168 hours. BNP (last 3 results) No results for input(s): "PROBNP" in the last 8760 hours. HbA1C: No results for input(s): "HGBA1C" in the last 72 hours. CBG: Recent Labs  Lab 04/05/22 1123 04/05/22 1725 04/05/22 2313 04/06/22 0509 04/06/22 1130  GLUCAP 199* 135* 124* 111* 119*   Lipid Profile: Recent Labs    04/04/22 0450  TRIG 191*   Thyroid Function Tests: No results for input(s): "TSH", "T4TOTAL", "FREET4", "T3FREE", "THYROIDAB" in the last 72 hours. Anemia Panel: No results for input(s): "VITAMINB12", "FOLATE", "FERRITIN", "TIBC", "IRON", "RETICCTPCT" in the last 72 hours. Urine analysis:    Component Value Date/Time   COLORURINE RED (A) 03/24/2022 1802   APPEARANCEUR TURBID (A) 03/24/2022 1802   LABSPEC 1.045 (H) 03/24/2022 1802   PHURINE  03/24/2022 1802    TEST NOT REPORTED DUE TO COLOR INTERFERENCE OF URINE PIGMENT   GLUCOSEU (A) 03/24/2022 1802    TEST NOT REPORTED DUE TO COLOR INTERFERENCE OF URINE PIGMENT   HGBUR NEGATIVE 03/24/2022 1802   BILIRUBINUR (A) 03/24/2022 1802    TEST NOT REPORTED DUE TO COLOR INTERFERENCE OF URINE PIGMENT   KETONESUR (A) 03/24/2022 1802    TEST NOT REPORTED DUE TO COLOR INTERFERENCE OF URINE PIGMENT   PROTEINUR (A) 03/24/2022 1802    TEST NOT REPORTED DUE TO COLOR INTERFERENCE OF URINE PIGMENT   NITRITE (A) 03/24/2022 1802    TEST NOT REPORTED DUE TO COLOR INTERFERENCE OF URINE PIGMENT   LEUKOCYTESUR (A) 03/24/2022 1802    TEST NOT REPORTED DUE TO COLOR INTERFERENCE OF URINE PIGMENT   Sepsis Labs: '@LABRCNTIP'$ (procalcitonin:4,lacticidven:4)  Recent Results (from the past 240 hour(s))  Aerobic/Anaerobic Culture w Gram Stain (surgical/deep wound)     Status: None (Preliminary result)   Collection Time: 04/02/22  1:00 PM   Specimen: Abscess  Result Value Ref Range Status   Specimen Description ABSCESS  Final   Special Requests RLQ ABDOMINAL ABSCESS  Final   Gram Stain   Final    ABUNDANT WBC  PRESENT,BOTH PMN AND MONONUCLEAR NO ORGANISMS SEEN    Culture   Final    NO GROWTH  4 DAYS NO ANAEROBES ISOLATED; CULTURE IN PROGRESS FOR 5 DAYS Performed at [facility], [place] [address], [place], [place] [address]    Report Status PENDING  Incomplete  Aerobic/Anaerobic Culture w Gram Stain (surgical/deep wound)     Status: None (Preliminary result)   Collection Time: 04/02/22  1:01 PM   Specimen: Abscess  Result Value Ref Range Status   Specimen Description ABSCESS  Final   Special Requests LLQ ABDOMINAL ABSCESS  Final   Gram Stain   Final    ABUNDANT WBC PRESENT,BOTH PMN AND MONONUCLEAR NO ORGANISMS SEEN    Culture   Final    NO GROWTH 4 DAYS NO ANAEROBES ISOLATED; CULTURE IN PROGRESS FOR 5 DAYS Performed at [facility], [place] [address], [place], [place] [address]    Report Status PENDING  Incomplete         Radiology Studies: DG Chest 2 View  Result Date: 03/22/2022 CLINICAL DATA:  Shortness of breath. EXAM: CHEST - 2 VIEW COMPARISON:  Two-view chest x-ray 07/26/2016.  CT chest 03/21/2022 FINDINGS: Low lung volumes exaggerate the heart size. Left lower lobe airspace opacities are again noted. No other significant airspace disease is present. IMPRESSION: Persistent left lower lobe airspace disease concerning for pneumonia. Electronically Signed   By: San Morelle M.D.   On: 03/22/2022 21:46   CT IMAGE GUIDED DRAINAGE PERCUT CATH  PERITONEAL RETROPERIT  Result Date: 03/22/2022 INDICATION: Abdominopelvic abscess EXAM: CT-guided placement of drainage catheter into abdominopelvic abscess TECHNIQUE: Multidetector CT imaging of the abdomen and pelvis was performed following the standard protocol without IV contrast. RADIATION DOSE REDUCTION: This exam was performed according to the departmental dose-optimization program which includes automated exposure control, adjustment of the mA and/or kV according to patient size and/or use of iterative reconstruction  technique. MEDICATIONS: Per EMR ANESTHESIA/SEDATION: Local analgesia COMPLICATIONS: None immediate. PROCEDURE: Informed written consent was obtained from the patient after a thorough discussion of the procedural risks, benefits and alternatives. All questions were addressed. Maximal Sterile Barrier Technique was utilized including caps, mask, sterile gowns, sterile gloves, sterile drape, hand hygiene and skin antiseptic. A timeout was performed prior to the initiation of the procedure. The patient was placed supine on the exam table. Limited CT of the abdomen and pelvis was performed for planning purposes. This again demonstrated an air in fluid collection in the lower abdomen and pelvis. Skin entry site was marked with a planned anterolateral approach from the left lower quadrant. The overlying skin was prepped and draped in a standard sterile fashion. Local analgesia was obtained with 1% lidocaine. Using intermittent CT fluoroscopy, an 18 gauge trocar needle was advanced towards the identified abdominopelvic fluid collection. Access was confirmed with CT and return of purulent material. An 035 wire was then advanced through the access needle, over which the percutaneous tract was serially dilated to accommodate a 12 [place] multipurpose locking drainage catheter. Location was again confirmed with CT and return of additional purulent material. Catheter was secured to the skin using silk suture and a dressing. It was attached to bulb suction. The patient tolerated the procedure well without immediate complication. IMPRESSION: Successful CT-guided placement of a 12 French locking drainage catheter into the lower abdominopelvic abscess. Sample sent to the lab for microbiology analysis. Electronically Signed   By: Albin Felling M.D.   On: 03/22/2022 15:12   CT ABDOMEN PELVIS W CONTRAST  Result Date: 03/21/2022 CLINICAL DATA:  Abdominal pain, post-op Abdominal pain, acute, nonlocalized. Robotic salpingectomy  03/18/2022  EXAM: CT ABDOMEN AND PELVIS WITH CONTRAST TECHNIQUE: Multidetector CT imaging of the abdomen and pelvis was performed using the standard protocol following bolus administration of intravenous contrast. RADIATION DOSE REDUCTION: This exam was performed according to the departmental dose-optimization program which includes automated exposure control, adjustment of the mA and/or kV according to patient size and/or use of iterative reconstruction technique. CONTRAST:  158m OMNIPAQUE IOHEXOL 300 MG/ML  SOLN COMPARISON:  None Available. FINDINGS: Lower chest: Left base atelectasis.  No acute abnormality. Hepatobiliary: Liver is enlarged measuring up to 19 cm. The hepatic parenchyma is diffusely hypodense compared to the splenic parenchyma consistent with fatty infiltration. No focal liver abnormality. No gallstones, gallbladder wall thickening, or pericholecystic fluid. No biliary dilatation. Pancreas: No focal lesion. Normal pancreatic contour. No surrounding inflammatory changes. No main pancreatic ductal dilatation. Spleen: Normal in size without focal abnormality. Adrenals/Urinary Tract: No adrenal nodule bilaterally. Bilateral kidneys enhance symmetrically. No hydronephrosis. No hydroureter. The urinary bladder is unremarkable. On delayed imaging, there is no urothelial wall thickening and there are no filling defects in the opacified portions of the bilateral collecting systems or ureters as well as urinary bladder. Stomach/Bowel: Stomach is within normal limits. No evidence of bowel wall thickening or dilatation. Appendix appears normal. Vascular/Lymphatic: No abdominal aorta or iliac aneurysm. No abdominal, pelvic, or inguinal lymphadenopathy. Reproductive: Uterus is unremarkable no adnexal mass identified. Other: No intraperitoneal free fluid. Small volume free intraperitoneal gas. There is a gas and fluid collection with peripheral enhancement measuring approximately 8 x 9 x 7.5 cm centered within  the left rectouterine pouch. Musculoskeletal: No abdominal wall hernia or abnormality. No suspicious lytic or blastic osseous lesions. No acute displaced fracture. IMPRESSION: 1. An 8 x 9 x 7.5cm gas and fluid collection centered within the left rectouterine pouch likely represents a developing abscess. No findings to suggest bowel injury; however, this is not fully excluded. If a CT is repeated in the near future, consider use of IV and PO contrast. 2. Small volume pneumoperitoneum likely postsurgical in etiology. 3. Hepatomegaly and hepatic steatosis. Electronically Signed   By: MIven FinnM.D.   On: 03/21/2022 18:43            LOS: 16 days      NEmeterio Reeve DO [facility] 04/06/2022, 1:57 PM   Staff may message me via secure chat in [place] but this may not receive immediate response,  please page for urgent matters!  If 7PM-7AM, please contact night-coverage [email or web address]  Dictation software was used to generate the above note. Typos may occur and escape review, as with typed/written notes. Please contact Dr ASheppard Coildirectly for clarity if needed.

## 2022-04-06 NOTE — Progress Notes (Signed)
Physical Therapy Treatment Patient Details Name: Lacey Harrison MRN: [id number] DOB: 1985/11/10 Today's Date: 04/06/2022   History of Present Illness Pt is a 36 y.o. female presenting to hospital post op day 3 from robotic assisted laparoscopic surgery (lysis of adhesions, left salpingectomy, drainage of large peritoneal cyst, chromotubation 7/24) c/o sudden onset of severe abdominal pain.  CT drain placement into pelvic abscess 7/28.  Pt developed severe sepsis/septic shock after drainage of abdominal pelvic abscess and transferred to ICU.  Pt also noted with acute hypoxic respiratory distress and AKI; developing ileus.  S/p 8/2 exploratory laparotomy, sigmoid colectomy with colostomy creation, and Hartman's procedure for perforated sigmoid colon.  PMH includes smoking, anxiety, lyme disease, migraines, PNA, chromopertubation, cystectomy L, infertility, previous surgery for L ovarian cystectomy L for dermoid cyst complicated by cystotomy and bladder repair in 2007, pelvic pain (underwent 02/16/18 lap and extensive abdominopelvic adhesiolysis).    PT Comments    Pt was long sitting in bed upon arriving. She is A and O x 4 and pre-medicated prior to session. Was able to to exit bed, stand, and ambulate with RW with supervision only. Pt continues to be slow moving due to pain but overall is progressing well towards all PT goals. Recommend DC home with HHPT at DC. May advance to OP PT by the time she is Dc'd.   Recommendations for follow up therapy are one component of a multi-disciplinary discharge planning process, led by the attending physician.  Recommendations may be updated based on patient status, additional functional criteria and insurance authorization.  Follow Up Recommendations  Home health PT     Assistance Recommended at Discharge Set up Supervision/Assistance  Patient can return home with the following A little help with bathing/dressing/bathroom;Assistance with  cooking/housework;Direct supervision/assist for medications management;Assist for transportation;Help with stairs or ramp for entrance   Equipment Recommendations  Rolling walker (2 wheels)       Precautions / Restrictions Precautions Precautions: Fall Precaution Comments: JP drain L abdomen, picc line, long abdominal incision; L UQ colostomy, drain on R side Restrictions Weight Bearing Restrictions: No     Mobility  Bed Mobility Overal bed mobility: Needs Assistance Bed Mobility: Supine to Sit (semi sidelying technique)     Supine to sit: Supervision     General bed mobility comments: pt did not require physical assistance to exit bed. Increased time due to pain.    Transfers Overall transfer level: Needs assistance Equipment used: Rolling walker (2 wheels) Transfers: Sit to/from Stand Sit to Stand: Supervision       Ambulation/Gait Ambulation/Gait assistance: Supervision Gait Distance (Feet): 200 Feet Assistive device: Rolling walker (2 wheels) Gait Pattern/deviations: Step-through pattern, Antalgic Gait velocity: decreased     General Gait Details: only 2 standing rest this session     Balance Overall balance assessment: Needs assistance Sitting-balance support: Feet supported, Bilateral upper extremity supported Sitting balance-Leahy Scale: Good     Standing balance support: Bilateral upper extremity supported, During functional activity, Reliant on assistive device for balance Standing balance-Leahy Scale: Good         Cognition Arousal/Alertness: Awake/alert Behavior During Therapy: WFL for tasks assessed/performed, Anxious Overall Cognitive Status: Within Functional Limits for tasks assessed      General Comments: Pleasant, motivated but painful               Pertinent Vitals/Pain Pain Assessment Pain Assessment: 0-10 Pain Score: 7  Pain Location: abdomen Pain Descriptors / Indicators: Discomfort, Grimacing, Guarding, Shooting Pain  Intervention(s): Limited activity  within patient's tolerance, Monitored during session, Premedicated before session, Repositioned     PT Goals (current goals can now be found in the care plan section) Acute Rehab PT Goals Patient Stated Goal: less pain with movement Progress towards PT goals: Progressing toward goals    Frequency    7X/week      PT Plan Current plan remains appropriate    Co-evaluation     PT goals addressed during session: Mobility/safety with mobility;Balance;Proper use of DME;Strengthening/ROM        AM-PAC PT "6 Clicks" Mobility   Outcome Measure  Help needed turning from your back to your side while in a flat bed without using bedrails?: A Little Help needed moving from lying on your back to sitting on the side of a flat bed without using bedrails?: A Little Help needed moving to and from a bed to a chair (including a wheelchair)?: A Little Help needed standing up from a chair using your arms (e.g., wheelchair or bedside chair)?: A Little Help needed to walk in hospital room?: A Little Help needed climbing 3-5 steps with a railing? : A Little 6 Click Score: 18    End of Session   Activity Tolerance: Patient tolerated treatment well Patient left: in bed;with call bell/phone within reach;with family/visitor present Nurse Communication: Mobility status PT Visit Diagnosis: Other abnormalities of gait and mobility (R26.89);Muscle weakness (generalized) (M62.81);Pain     Time: 1144-1200 PT Time Calculation (min) (ACUTE ONLY): 16 min  Charges:  $Gait Training: 8-22 mins                    Julaine Fusi PTA 04/06/22, 1:27 PM

## 2022-04-06 NOTE — Progress Notes (Signed)
[facility] Hospital Day(s): 16.   Post op day(s): 10 Days Post-Op.   Interval History:  Patient seen and examined No acute events or new complaints overnight.  Patient reports degree of discouragement, mother at bedside. No fever, chills, nausea, emesis  Drains are as follows:   - Surgical Drain: serous  -IR drains both serous. On meropenem; new Cx w/o growth  Ostomy with some evidence of function.  Liquid stool present. She is on Regular diet; tolerating  Vital signs in last 24 hours: [min-max] current  Temp:  [97.6 F (36.4 C)-98.8 F (37.1 C)] 98.4 F (36.9 C) (08/12 0818) Pulse Rate:  [94-109] 94 (08/12 0818) Resp:  [16-18] 16 (08/12 0818) BP: (99-102)/(64-69) 99/64 (08/12 0818) SpO2:  [93 %-96 %] 93 % (08/12 0818) Weight:  [97.4 kg] 97.4 kg (08/12 0601)     Height: '5\' 3"'$  (160 cm) Weight: 97.4 kg BMI (Calculated): 38.05   Intake/Output last 2 shifts:  08/11 0701 - 08/12 0700 In: 1460.6 [P.O.:360; I.V.:200.6; IV Piggyback:900] Out: 1022 [Drains:122; Stool:900]   Physical Exam:  Constitutional: alert, cooperative and no distress  Respiratory: breathing non-labored at rest  Cardiovascular: regular rate and sinus rhythm  Gastrointestinal: Soft, expected tenderness, non-distended, no rebound/guarding. Surgical drain in LLQ; output serous: IR drains in RLQ and LLQ output is serous fluid, no gross purulence. 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  Labs:     Latest Ref Rng & Units 04/04/2022    4:50 AM 04/03/2022    5:25 AM 04/02/2022    4:36 AM  CBC  WBC 4.0 - 10.5 K/uL 11.8  13.7  19.0   Hemoglobin 12.0 - 15.0 g/dL 8.0  8.0  8.5   Hematocrit 36.0 - 46.0 % 25.8  25.9  27.1   Platelets 150 - 400 K/uL 437  392  377       Latest Ref Rng & Units 04/05/2022    5:18 AM 04/04/2022    4:50 AM 04/01/2022    4:35 AM  CMP  Glucose 70 - 99 mg/dL  131  118  106   BUN 6 - 20 mg/dL '16  9  10   '$ Creatinine 0.44 - 1.00 mg/dL 0.57  0.49  0.52   Sodium 135 - 145 mmol/L 136  136  140   Potassium 3.5 - 5.1 mmol/L 4.6  4.5  4.4   Chloride 98 - 111 mmol/L 103  102  107   CO2 22 - 32 mmol/L '27  26  24   '$ Calcium 8.9 - 10.3 mg/dL 8.5  8.1  8.2   Total Protein 6.5 - 8.1 g/dL  6.3  5.9   Total Bilirubin 0.3 - 1.2 mg/dL  1.2  2.3   Alkaline Phos 38 - 126 U/L  54  76   AST 15 - 41 U/L  27  33   ALT 0 - 44 U/L  27  30      Imaging studies: No new imaging studies    Assessment/Plan:  36 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   - Pain control remains biggest barrier; she has multimodal therapy as follows:    - Added Toradol 30 mg q6 back; stopped PO ibuprofen   - Stopped fentanyl; Added q4 PRN 0.5-1.0 mg Dilaudid for BREAKTHROUGH only   - Oxycodone 5-10 mg q4  PRN   - Lyrica 200 mg TID   - Also with IV valium 5 mg q6 PRN (for anxiety)   - Added lidocaine patches   - Continue FLD + nutritional supplementation - Continue TPN; unfortunately did not get ordered this morning, monitor electrolytes - Continue IV Abx (meropenem); new Cx pending; ID on board   - Monitor colostomy function; this will likely get dusky; WOC RN following  - Wound Care: Wound Vac MWF; WOC RN assisting  - Removed surgical drain  - Continue IR drains; monitor and record output; flush  - Monitor abdominal examination - Monitor leukocytosis; improved  - Monitor H&H; stable - Mobilizing with therapies; recommendations are CIR - Further management per primary service  All of the above findings and recommendations were discussed with the patient, patient's family (mother-in-law at bedside), and the medical team, and all of patient's and family's questions were answered to their expressed satisfaction.   Ronny Bacon, M.D., [facility]  04/06/2022  ; 8:23 AM

## 2022-04-07 DIAGNOSIS — T8143XA Infection following a procedure, organ and space surgical site, initial encounter: Secondary | ICD-10-CM | POA: Diagnosis not present

## 2022-04-07 LAB — AEROBIC/ANAEROBIC CULTURE W GRAM STAIN (SURGICAL/DEEP WOUND)
Culture: NO GROWTH
Culture: NO GROWTH

## 2022-04-07 LAB — GLUCOSE, CAPILLARY: Glucose-Capillary: 100 mg/dL — ABNORMAL HIGH (ref 70–99)

## 2022-04-07 MED ORDER — ENOXAPARIN SODIUM 60 MG/0.6ML IJ SOSY
0.5000 mg/kg | PREFILLED_SYRINGE | INTRAMUSCULAR | Status: DC
Start: 2022-04-07 — End: 2022-04-11
  Administered 2022-04-07 – 2022-04-10 (×4): 47.5 mg via SUBCUTANEOUS
  Filled 2022-04-07 (×3): qty 0.6
  Filled 2022-04-07: qty 0.47
  Filled 2022-04-07: qty 0.6

## 2022-04-07 NOTE — Progress Notes (Signed)
Mobility Specialist - Progress Note    04/07/22 1400  Mobility  Activity Refused mobility    Pt refuses mobility at this time, no reason specified. Second attempt this date, will attempt at another date and time.  Merrily Brittle Mobility Specialist 04/07/22, 2:18 PM

## 2022-04-07 NOTE — Progress Notes (Signed)
Physical Therapy Treatment Patient Details Name: Lacey Harrison MRN: [id number] DOB: August 02, 1986 Today's Date: 04/07/2022   History of Present Illness Pt is a 36 y.o. female presenting to hospital post op day 3 from robotic assisted laparoscopic surgery (lysis of adhesions, left salpingectomy, drainage of large peritoneal cyst, chromotubation 7/24) c/o sudden onset of severe abdominal pain.  CT drain placement into pelvic abscess 7/28.  Pt developed severe sepsis/septic shock after drainage of abdominal pelvic abscess and transferred to ICU.  Pt also noted with acute hypoxic respiratory distress and AKI; developing ileus.  S/p 8/2 exploratory laparotomy, sigmoid colectomy with colostomy creation, and Hartman's procedure for perforated sigmoid colon.  PMH includes smoking, anxiety, lyme disease, migraines, PNA, chromopertubation, cystectomy L, infertility, previous surgery for L ovarian cystectomy L for dermoid cyst complicated by cystotomy and bladder repair in 2007, pelvic pain (underwent 02/16/18 lap and extensive abdominopelvic adhesiolysis).    PT Comments    OOB and completes x 2 laps on unit with RW.  Slow gait.  Some minor imbalances that she recovers without assist.  She does feel more comfortable with RW and would struggle without it at this time.  Recommendations for follow up therapy are one component of a multi-disciplinary discharge planning process, led by the attending physician.  Recommendations may be updated based on patient status, additional functional criteria and insurance authorization.  Follow Up Recommendations  Home health PT     Assistance Recommended at Discharge Set up Supervision/Assistance  Patient can return home with the following A little help with bathing/dressing/bathroom;Assistance with cooking/housework;Direct supervision/assist for medications management;Assist for transportation;Help with stairs or ramp for entrance   Equipment Recommendations  Rolling  walker (2 wheels)    Recommendations for Other Services       Precautions / Restrictions Precautions Precautions: Fall Precaution Comments: JP drain L abdomen, picc line, long abdominal incision; L UQ colostomy, drain on R side Restrictions Weight Bearing Restrictions: No     Mobility  Bed Mobility Overal bed mobility: Needs Assistance Bed Mobility: Supine to Sit (semi sidelying technique)     Supine to sit: Supervision     General bed mobility comments: pt did not require physical assistance to exit bed. Increased time due to pain.    Transfers Overall transfer level: Needs assistance Equipment used: Rolling walker (2 wheels) Transfers: Sit to/from Stand Sit to Stand: Supervision                Ambulation/Gait Ambulation/Gait assistance: Supervision Gait Distance (Feet): 350 Feet Assistive device: Rolling walker (2 wheels) Gait Pattern/deviations: Step-through pattern, Antalgic Gait velocity: decreased     General Gait Details: only 2 standing rest this session   Stairs             Wheelchair Mobility    Modified Rankin (Stroke Patients Only)       Balance Overall balance assessment: Needs assistance Sitting-balance support: Feet supported, Bilateral upper extremity supported Sitting balance-Leahy Scale: Good     Standing balance support: Bilateral upper extremity supported, During functional activity, Reliant on assistive device for balance Standing balance-Leahy Scale: Good                              Cognition Arousal/Alertness: Awake/alert Behavior During Therapy: WFL for tasks assessed/performed, Anxious Overall Cognitive Status: Within Functional Limits for tasks assessed  Exercises      General Comments        Pertinent Vitals/Pain Pain Assessment Pain Assessment: Faces Faces Pain Scale: Hurts little more Pain Location: abdomen Pain Descriptors /  Indicators: Discomfort, Grimacing, Guarding, Shooting Pain Intervention(s): Limited activity within patient's tolerance, Monitored during session, Premedicated before session, Repositioned    Home Living                          Prior Function            PT Goals (current goals can now be found in the care plan section) Progress towards PT goals: Progressing toward goals    Frequency    7X/week      PT Plan Current plan remains appropriate    Co-evaluation              AM-PAC PT "6 Clicks" Mobility   Outcome Measure  Help needed turning from your back to your side while in a flat bed without using bedrails?: None Help needed moving from lying on your back to sitting on the side of a flat bed without using bedrails?: A Little Help needed moving to and from a bed to a chair (including a wheelchair)?: A Little Help needed standing up from a chair using your arms (e.g., wheelchair or bedside chair)?: A Little Help needed to walk in hospital room?: A Little Help needed climbing 3-5 steps with a railing? : A Little 6 Click Score: 19    End of Session   Activity Tolerance: Patient tolerated treatment well Patient left: in bed;with call bell/phone within reach;with family/visitor present Nurse Communication: Mobility status PT Visit Diagnosis: Other abnormalities of gait and mobility (R26.89);Muscle weakness (generalized) (M62.81);Pain     Time: 1140-1153 PT Time Calculation (min) (ACUTE ONLY): 13 min  Charges:  $Gait Training: 8-22 mins                   Chesley Noon, PTA 04/07/22, 1:24 PM

## 2022-04-07 NOTE — Progress Notes (Signed)
[facility] Hospital Day(s): [place] op day(s): 11 Days Post-Op.   Interval History:  Patient seen and examined No acute events or new complaints overnight.  No fever, chills, nausea, emesis She is glad to be off TPN Drains remain with minimal serous output. On meropenem;  She is anticipating CT scan follow-up tomorrow. Ostomy with function.  Liquid stool present. She is on Regular diet; tolerating  Vital signs in last 24 hours: [min-max] current  Temp:  [98.1 F (36.7 C)-98.4 F (36.9 C)] 98.1 F (36.7 C) (08/13 0457) Pulse Rate:  [94-110] 100 (08/13 0457) Resp:  [16] 16 (08/13 0457) BP: (99-103)/(64-75) 100/66 (08/13 0457) SpO2:  [93 %-96 %] 95 % (08/13 0457)     Height: '5\' 3"'$  (160 cm) Weight: 97.4 kg BMI (Calculated): 38.05   Intake/Output last 2 shifts:  08/12 0701 - 08/13 0700 In: 10  Out: 80 [Drains:80]   Physical Exam:  Constitutional: alert, cooperative and no distress  Respiratory: breathing non-labored at rest  Cardiovascular: regular rate and sinus rhythm  Gastrointestinal: Soft, expected tenderness, non-distended, no rebound/guarding. Surgical drain in LLQ; output serous: IR drains in RLQ and LLQ output is serous fluid, no gross purulence. 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  Labs:     Latest Ref Rng & Units 04/04/2022    4:50 AM 04/03/2022    5:25 AM 04/02/2022    4:36 AM  CBC  WBC 4.0 - 10.5 K/uL 11.8  13.7  19.0   Hemoglobin 12.0 - 15.0 g/dL 8.0  8.0  8.5   Hematocrit 36.0 - 46.0 % 25.8  25.9  27.1   Platelets 150 - 400 K/uL 437  392  377       Latest Ref Rng & Units 04/05/2022    5:18 AM 04/04/2022    4:50 AM 04/01/2022    4:35 AM  CMP  Glucose 70 - 99 mg/dL 131  118  106   BUN 6 - 20 mg/dL '16  9  10   '$ Creatinine 0.44 - 1.00 mg/dL 0.57  0.49  0.52   Sodium 135 - 145 mmol/L 136  136  140   Potassium 3.5 - 5.1  mmol/L 4.6  4.5  4.4   Chloride 98 - 111 mmol/L 103  102  107   CO2 22 - 32 mmol/L '27  26  24   '$ Calcium 8.9 - 10.3 mg/dL 8.5  8.1  8.2   Total Protein 6.5 - 8.1 g/dL  6.3  5.9   Total Bilirubin 0.3 - 1.2 mg/dL  1.2  2.3   Alkaline Phos 38 - 126 U/L  54  76   AST 15 - 41 U/L  27  33   ALT 0 - 44 U/L  27  30      Imaging studies: No new imaging studies    Assessment/Plan:  36 y.o. female , 11 Days Post-Op s/p exploratory laparotomy and Hartman's procedure for perforated sigmoid colon.   - Pain control appears achieved, she reports diminishing pain.    -Regular diet -Discontinue TPN;  - Continue IV Abx (meropenem); new Cx pending; ID on board   - Monitor colostomy function; WOC RN following  - Wound Care: Wound Vac MWF; WOC RN assisting  - Continue IR drains; monitor and record output; flush  - Monitor abdominal examination - Mobilizing with therapies; recommendations are CIR - Further management per  primary service  All of the above findings and recommendations were discussed with the patient, and the medical team, and all of patient's questions were answered to their expressed satisfaction.   Ronny Bacon, M.D., [facility]  04/07/2022 ; 6:57 AM

## 2022-04-07 NOTE — Progress Notes (Signed)
PROGRESS NOTE    Lacey Harrison   IHK:[id number] DOB: 1986-08-11  DOA: 03/21/2022 Date of Service: 04/07/22 PCP: Rubie Maid, MD     Brief Narrative / Hospital Course:  36 yo F presenting to [facility] ED from home on 03/21/22 with complaints of sudden onset severe abdominal pain. She recently on 03/18/22 had robotic adhesiolysis, left salpingectomy, drainage of 500 mL from large LEFT peritoneal cyst & chromotubation. She was discharged home on 7/56 without complication. She reported being on the floor with her cat and twisting her body around, with sudden acute abdominal pain. Patient complained of severe pain in lower abdomen radiating towards her vagina.  07/27: CT imaging abscess. Leukocytosis with left shift at 16.8. Initiated on IV Zosyn and Doxycycline. Admitted to OBGYN.  07/28: IR procedure 07/28 CT drain placement into pelvic abscess, left anterior approach, 12 Fr to bulb suction. Patient developed severe sepsis/septic shock.  She required transfer to stepdown unit, due to concerns for respiratory status PCCM consulted, receiving BS abx w/ zosyn, doxycycline & vancomycin, volume resuscitation w/ IVF, awaiting culture results   07/29: Remains in SDU w/ ileus and sepsis. IV antibiotics. Repeat CT abdomen pelvis , new loculations on the right, anasarca, distended urinary bladder. No contrast extravasation to suggest bowel perforation 07/30: Continues to have abdominal pain, required NG, abdomen less distended.  07/31: repeat CT The abscess is slightly decreased compared to prior exam. Continuing on Unasyn and Diflucan.  08/01: VSS, WBC trending down, BCx NGx4d. Ileus no better. Drain in place, minimal output. NG in place. PICC and TPN initiated. Spoke w/ GYN, plan for surgical consult and ID consult.  8/02: Patient continued to have persistently elevated white cell count with abdominal pain.  General surgery has scheduled exploratory laparotomy. 8/03: She  underwent Exploratory laparotomy,  sigmoid colectomy with colostomy creation, Hartmann procedure, lysis of additions.  Continue TPN and IV antibiotics. 08/04: Leukocytosis persist could be due to stress response.  Remains afebrile, continue NPO. and TPN, wound care consult, colostomy education. 08/05: NG tube removed, Foley removed.  Patient started on clear liquid diet 08/07: WBC remains elevated, continues to have pain.  General surgery ordered CT abdomen and pelvis to rule out intra-abdominal process. 08/08: Repeat CT scan abdomen shows persistent pockets of fluid. IR performed CT-guided placement of drain x 2 into RLQ, LLQ/pelvis. 08/09: WBC improving, tolerating CLD trial FLD today.  08/10: tolerating FLD, removed surgical drain, continue IR drains and wound VAC. Continue IV Abx (meropenem); new Cx pending (NGx12h); ID on board - plan for possible discontinuing antibiotics Monday 08/14.  08/11-08/13: weaning TPN and try to control pain on po meds    Consultants:  GYN PCCM IR General Surgery Infectious Disease   Procedures: 07/28 CT drain placement into pelvic abscess 07/30 NG tube placed  07/31 PICC line. 08/03 Exploratory laparotomy, sigmoid colectomy with colostomy creation, Hartmann procedure, lysis of additions 08/08: Repeat CT scan abdomen shows persistent pockets of fluid. IR performed CT-guided placement of drain x 2 into RLQ, LLQ/pelvis.    Subjective: Patient reports doing well today, off TPN, tolerating solid foods with some minimal nausea     ASSESSMENT & PLAN:   Principal Problem:   Postoperative intra-abdominal abscess Active Problems:   Acute respiratory failure with hypoxia (HCC) in seting of MSKpain, atelectasis, severe sepsis    Severe sepsis ([place]) without septic shock, d/t postoperative abscess, s/p pelvic drain placement    Ileus, postoperative ([place])   AKI (acute kidney injury) ([place]) in setting of  sepsis, IV contrast, NAGMA - RESOLVED    Abdominal pain   INFECTIOUS DISEASE Severe  sepsis ([place]) without septic shock - RESOLVED Postoperative intraabdominal abscess/peritonitis -  IMPROVING/STABLE 36 year old female with recent robotic lap for adhesiolysis and left salpingectomy and drainage of the left hydrosalpinx peritoneal cyst which was complicated by pelvic abscess secondary to sigmoid perforation. Initially underwent pelvic drain placement by IR.  Multiple organisms of colon origin and culture.  E. coli, Bacteroides, Clostridium perfringens and yeast. Patient underwent exploratory laparotomy and sigmoid colon resection with colostomy. Fecal peritonitis was present, she has been on Unasyn and fluconazole, changed to meropenem.  IR placed 2 additional drains 04/02/22.  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/perforation 03/28/22 Exploratory laparotomy, sigmoid colectomy with colostomy creation, Hartmann procedure, lysis of additions Advancing diet Colostomy functioning 08/11 start to wean TPN --> off as of 04/07/22 and tolerating solids  General surgery following Bowel regimen PPI Pain control try for po meds in anticipation of d/c home next few days   RESPIRATORY Acute respiratory failure with hypoxia (HCC) in seting of MSKpain, atelectasis, severe sepsis - RESOLVED O2 prn  RENAL AKI (acute kidney injury) ([place]) in setting of sepsis, IV contrast, NAGMA - RESOLVED Serial BMP, Mg, Phos  CARDIOVASCULAR Stable  GENITOURINARY & REPRODUCTIVE Stable   NEUROLOGICAL & PSYCHIATRIC Stable Ambien prn   ENDOCRINE SSI  MUSCULOSKELETAL/RHEUM Stable Eval for inpatient rehab is underway but pending clinical course may be able to be discharged home.  DVT prophylaxis: lovenox Code Status: FULL Family Communication: Family at bedside on  rounds Disposition Plan / TOC needs: inpatient for now, may be able to go home w/ [facility] in a few days  Barriers to discharge / significant pending items: pending abx ID recommendations, transitioning to oral diet, pain control, GenSurg and ID plan/clearance, will probably need IV antibiotics through Monday, 04/08/2022 but hoping for discharge early this week             Objective: Vitals:   04/06/22 1509 04/06/22 2035 04/07/22 0457 04/07/22 0733  BP: 101/75 103/72 100/66 106/67  Pulse: (!) 106 (!) 110 100 94  Resp: '16 16 16 16  '$ Temp: 98.2 F (36.8 C) 98.1 F (36.7 C) 98.1 F (36.7 C) 98.6 F (37 C)  TempSrc: Oral Oral Oral Oral  SpO2: 95% 96% 95% 96%  Weight:      Height:        Intake/Output Summary (Last 24 hours) at 04/07/2022 0841 Last data filed at 04/07/2022 0557 Gross per 24 hour  Intake 10 ml  Output 80 ml  Net -70 ml   Filed Weights   04/02/22 0500 04/05/22 0500 04/06/22 0601  Weight: 101.6 kg 98.2 kg 97.4 kg    Examination:  Constitutional:  VS as above General Appearance: alert, well-developed, well-nourished, NAD Eyes: Normal lids and conjunctive, non-icteric sclera Ears, Nose, Mouth, Throat: Normal external appearance MMM Neck: No masses, trachea midline Respiratory: Normal respiratory effort Cardiovascular: S1/S2 normal No lower extremity edema Musculoskeletal:  No clubbing/cyanosis of digits Symmetrical movement in all extremities Neurological: No cranial nerve deficit on limited exam Alert Psychiatric: Normal judgment/insight Normal mood and affect       Scheduled Medications:   Chlorhexidine Gluconate Cloth  6 each Topical Daily   cyclobenzaprine  10 mg Oral TID   enoxaparin (LOVENOX)  injection  0.5 mg/kg Subcutaneous Q24H   feeding supplement  237 mL Oral TID BM   insulin aspart  0-15 Units Subcutaneous Q6H   insulin glargine-yfgn  10 Units Subcutaneous QHS   ketorolac  30 mg Intravenous Q6H   lidocaine  2 patch  Transdermal Q24H   pantoprazole  40 mg Oral QHS   pregabalin  200 mg Oral TID   sodium chloride flush  10-40 mL Intracatheter Q12H   sodium chloride flush  5 mL Intracatheter Q8H    Continuous Infusions:  sodium chloride 10 mL/hr at 04/07/22 0103   meropenem (MERREM) IV 1 g (04/07/22 0733)    PRN Medications:  sodium chloride, albuterol, alum & mag hydroxide-simeth, bisacodyl, diazepam, diphenhydrAMINE, HYDROmorphone (DILAUDID) injection, iohexol, ondansetron **OR** ondansetron (ZOFRAN) IV, mouth rinse, oxyCODONE, simethicone, sodium chloride flush, zolpidem  Antimicrobials:  Anti-infectives (From admission, onward)    Start     Dose/Rate Route Frequency Ordered Stop   04/01/22 1500  meropenem (MERREM) 1 g in sodium chloride 0.9 % 100 mL IVPB        1 g 200 mL/hr over 30 Minutes Intravenous Every 8 hours 04/01/22 1404 04/09/22 0759   03/25/22 1600  Ampicillin-Sulbactam (UNASYN) 3 g in sodium chloride 0.9 % 100 mL IVPB  Status:  Discontinued        3 g 200 mL/hr over 30 Minutes Intravenous Every 6 hours 03/25/22 1134 04/01/22 1354   03/24/22 2200  clindamycin (CLEOCIN) IVPB 900 mg  Status:  Discontinued        900 mg 100 mL/hr over 30 Minutes Intravenous Every 8 hours 03/24/22 1854 03/25/22 1134   03/23/22 1500  fluconazole (DIFLUCAN) IVPB 400 mg        400 mg 100 mL/hr over 120 Minutes Intravenous Every 24 hours 03/23/22 1358 04/06/22 1359   03/22/22 1851  vancomycin variable dose per unstable renal function (pharmacist dosing)  Status:  Discontinued         Does not apply See admin instructions 03/22/22 1851 03/23/22 1051   03/22/22 1645  vancomycin (VANCOCIN) IVPB 1000 mg/200 mL premix  Status:  Discontinued        1,000 mg 200 mL/hr over 60 Minutes Intravenous  Once 03/22/22 1553 03/22/22 1558   03/22/22 1645  vancomycin (VANCOREADY) IVPB 2000 mg/400 mL        2,000 mg 200 mL/hr over 120 Minutes Intravenous  Once 03/22/22 1558 03/22/22 1814   03/22/22 0400   piperacillin-tazobactam (ZOSYN) IVPB 3.375 g  Status:  Discontinued        3.375 g 12.5 mL/hr over 240 Minutes Intravenous Every 8 hours 03/21/22 2047 03/25/22 1134   03/21/22 2200  doxycycline (VIBRAMYCIN) 100 mg in sodium chloride 0.9 % 250 mL IVPB  Status:  Discontinued        100 mg 125 mL/hr over 120 Minutes Intravenous Every 12 hours 03/21/22 2047 03/23/22 1358   03/21/22 1900  piperacillin-tazobactam (ZOSYN) IVPB 3.375 g        3.375 g 12.5 mL/hr over 240 Minutes Intravenous STAT 03/21/22 1853 03/21/22 2300       Data Reviewed: I have personally reviewed following labs and imaging studies  CBC: Recent Labs  Lab 04/01/22 0435 04/02/22 0436 04/03/22 0525 04/04/22 0450  WBC 19.6* 19.0* 13.7* 11.8*  HGB 8.9* 8.5* 8.0* 8.0*  HCT 28.5* 27.1* 25.9* 25.8*  MCV 90.2 91.6 92.2 91.2  PLT 319 377 392 627*   Basic Metabolic Panel: Recent Labs  Lab 04/01/22 0435 04/02/22  1601 04/04/22 0450 04/05/22 0518  NA 140  --  136 136  K 4.4  --  4.5 4.6  CL 107  --  102 103  CO2 24  --  26 27  GLUCOSE 106*  --  118* 131*  BUN 10  --  9 16  CREATININE 0.52  --  0.49 0.57  CALCIUM 8.2*  --  8.1* 8.5*  MG 2.1  --  2.2 2.2  PHOS 5.2* 4.5 4.7* 5.1*   GFR: Estimated Creatinine Clearance: 108 mL/min (by C-G formula based on SCr of 0.57 mg/dL). Liver Function Tests: Recent Labs  Lab 04/01/22 0435 04/04/22 0450  AST 33 27  ALT 30 27  ALKPHOS 76 54  BILITOT 2.3* 1.2  PROT 5.9* 6.3*  ALBUMIN 2.0* 2.3*   No results for input(s): "LIPASE", "AMYLASE" in the last 168 hours. No results for input(s): "AMMONIA" in the last 168 hours. Coagulation Profile: Recent Labs  Lab 04/02/22 0436  INR 1.0   Cardiac Enzymes: No results for input(s): "CKTOTAL", "CKMB", "CKMBINDEX", "TROPONINI" in the last 168 hours. BNP (last 3 results) No results for input(s): "PROBNP" in the last 8760 hours. HbA1C: No results for input(s): "HGBA1C" in the last 72 hours. CBG: Recent Labs  Lab  04/06/22 1130 04/06/22 1652 04/06/22 2107 04/06/22 2258 04/07/22 0455  GLUCAP 119* 122* 103* 110* 100*   Lipid Profile: No results for input(s): "CHOL", "HDL", "LDLCALC", "TRIG", "CHOLHDL", "LDLDIRECT" in the last 72 hours.  Thyroid Function Tests: No results for input(s): "TSH", "T4TOTAL", "FREET4", "T3FREE", "THYROIDAB" in the last 72 hours. Anemia Panel: No results for input(s): "VITAMINB12", "FOLATE", "FERRITIN", "TIBC", "IRON", "RETICCTPCT" in the last 72 hours. Urine analysis:    Component Value Date/Time   COLORURINE RED (A) 03/24/2022 1802   APPEARANCEUR TURBID (A) 03/24/2022 1802   LABSPEC 1.045 (H) 03/24/2022 1802   PHURINE  03/24/2022 1802    TEST NOT REPORTED DUE TO COLOR INTERFERENCE OF URINE PIGMENT   GLUCOSEU (A) 03/24/2022 1802    TEST NOT REPORTED DUE TO COLOR INTERFERENCE OF URINE PIGMENT   HGBUR NEGATIVE 03/24/2022 1802   BILIRUBINUR (A) 03/24/2022 1802    TEST NOT REPORTED DUE TO COLOR INTERFERENCE OF URINE PIGMENT   KETONESUR (A) 03/24/2022 1802    TEST NOT REPORTED DUE TO COLOR INTERFERENCE OF URINE PIGMENT   PROTEINUR (A) 03/24/2022 1802    TEST NOT REPORTED DUE TO COLOR INTERFERENCE OF URINE PIGMENT   NITRITE (A) 03/24/2022 1802    TEST NOT REPORTED DUE TO COLOR INTERFERENCE OF URINE PIGMENT   LEUKOCYTESUR (A) 03/24/2022 1802    TEST NOT REPORTED DUE TO COLOR INTERFERENCE OF URINE PIGMENT   Sepsis Labs: '@LABRCNTIP'$ (procalcitonin:4,lacticidven:4)  Recent Results (from the past 240 hour(s))  Aerobic/Anaerobic Culture w Gram Stain (surgical/deep wound)     Status: None (Preliminary result)   Collection Time: 04/02/22  1:00 PM   Specimen: Abscess  Result Value Ref Range Status   Specimen Description ABSCESS  Final   Special Requests RLQ ABDOMINAL ABSCESS  Final   Gram Stain   Final    ABUNDANT WBC PRESENT,BOTH PMN AND MONONUCLEAR NO ORGANISMS SEEN    Culture   Final    NO GROWTH 4 DAYS NO ANAEROBES ISOLATED; CULTURE IN PROGRESS FOR 5  DAYS Performed at [facility], [address] [place], [place] [address]    Report Status PENDING  Incomplete  Aerobic/Anaerobic Culture w Gram Stain (surgical/deep wound)     Status: None (Preliminary result)  Collection Time: 04/02/22  1:01 PM   Specimen: Abscess  Result Value Ref Range Status   Specimen Description ABSCESS  Final   Special Requests LLQ ABDOMINAL ABSCESS  Final   Gram Stain   Final    ABUNDANT WBC PRESENT,BOTH PMN AND MONONUCLEAR NO ORGANISMS SEEN    Culture   Final    NO GROWTH 4 DAYS NO ANAEROBES ISOLATED; CULTURE IN PROGRESS FOR 5 DAYS Performed at [facility], [place] [address], [place], [place] [address]    Report Status PENDING  Incomplete         Radiology Studies: DG Chest 2 View  Result Date: 03/22/2022 CLINICAL DATA:  Shortness of breath. EXAM: CHEST - 2 VIEW COMPARISON:  Two-view chest x-ray 07/26/2016.  CT chest 03/21/2022 FINDINGS: Low lung volumes exaggerate the heart size. Left lower lobe airspace opacities are again noted. No other significant airspace disease is present. IMPRESSION: Persistent left lower lobe airspace disease concerning for pneumonia. Electronically Signed   By: San Morelle M.D.   On: 03/22/2022 21:46   CT IMAGE GUIDED DRAINAGE PERCUT CATH  PERITONEAL RETROPERIT  Result Date: 03/22/2022 INDICATION: Abdominopelvic abscess EXAM: CT-guided placement of drainage catheter into abdominopelvic abscess TECHNIQUE: Multidetector CT imaging of the abdomen and pelvis was performed following the standard protocol without IV contrast. RADIATION DOSE REDUCTION: This exam was performed according to the departmental dose-optimization program which includes automated exposure control, adjustment of the mA and/or kV according to patient size and/or use of iterative reconstruction technique. MEDICATIONS: Per EMR ANESTHESIA/SEDATION: Local analgesia COMPLICATIONS: None immediate. PROCEDURE: Informed written consent was obtained  from the patient after a thorough discussion of the procedural risks, benefits and alternatives. All questions were addressed. Maximal Sterile Barrier Technique was utilized including caps, mask, sterile gowns, sterile gloves, sterile drape, hand hygiene and skin antiseptic. A timeout was performed prior to the initiation of the procedure. The patient was placed supine on the exam table. Limited CT of the abdomen and pelvis was performed for planning purposes. This again demonstrated an air in fluid collection in the lower abdomen and pelvis. Skin entry site was marked with a planned anterolateral approach from the left lower quadrant. The overlying skin was prepped and draped in a standard sterile fashion. Local analgesia was obtained with 1% lidocaine. Using intermittent CT fluoroscopy, an 18 gauge trocar needle was advanced towards the identified abdominopelvic fluid collection. Access was confirmed with CT and return of purulent material. An 035 wire was then advanced through the access needle, over which the percutaneous tract was serially dilated to accommodate a 12 [place] multipurpose locking drainage catheter. Location was again confirmed with CT and return of additional purulent material. Catheter was secured to the skin using silk suture and a dressing. It was attached to bulb suction. The patient tolerated the procedure well without immediate complication. IMPRESSION: Successful CT-guided placement of a 12 French locking drainage catheter into the lower abdominopelvic abscess. Sample sent to the lab for microbiology analysis. Electronically Signed   By: Albin Felling M.D.   On: 03/22/2022 15:12   CT ABDOMEN PELVIS W CONTRAST  Result Date: 03/21/2022 CLINICAL DATA:  Abdominal pain, post-op Abdominal pain, acute, nonlocalized. Robotic salpingectomy 03/18/2022 EXAM: CT ABDOMEN AND PELVIS WITH CONTRAST TECHNIQUE: Multidetector CT imaging of the abdomen and pelvis was performed using the standard protocol  following bolus administration of intravenous contrast. RADIATION DOSE REDUCTION: This exam was performed according to the departmental dose-optimization program which includes automated exposure control, adjustment of the mA  and/or kV according to patient size and/or use of iterative reconstruction technique. CONTRAST:  113m OMNIPAQUE IOHEXOL 300 MG/ML  SOLN COMPARISON:  None Available. FINDINGS: Lower chest: Left base atelectasis.  No acute abnormality. Hepatobiliary: Liver is enlarged measuring up to 19 cm. The hepatic parenchyma is diffusely hypodense compared to the splenic parenchyma consistent with fatty infiltration. No focal liver abnormality. No gallstones, gallbladder wall thickening, or pericholecystic fluid. No biliary dilatation. Pancreas: No focal lesion. Normal pancreatic contour. No surrounding inflammatory changes. No main pancreatic ductal dilatation. Spleen: Normal in size without focal abnormality. Adrenals/Urinary Tract: No adrenal nodule bilaterally. Bilateral kidneys enhance symmetrically. No hydronephrosis. No hydroureter. The urinary bladder is unremarkable. On delayed imaging, there is no urothelial wall thickening and there are no filling defects in the opacified portions of the bilateral collecting systems or ureters as well as urinary bladder. Stomach/Bowel: Stomach is within normal limits. No evidence of bowel wall thickening or dilatation. Appendix appears normal. Vascular/Lymphatic: No abdominal aorta or iliac aneurysm. No abdominal, pelvic, or inguinal lymphadenopathy. Reproductive: Uterus is unremarkable no adnexal mass identified. Other: No intraperitoneal free fluid. Small volume free intraperitoneal gas. There is a gas and fluid collection with peripheral enhancement measuring approximately 8 x 9 x 7.5 cm centered within the left rectouterine pouch. Musculoskeletal: No abdominal wall hernia or abnormality. No suspicious lytic or blastic osseous lesions. No acute displaced  fracture. IMPRESSION: 1. An 8 x 9 x 7.5cm gas and fluid collection centered within the left rectouterine pouch likely represents a developing abscess. No findings to suggest bowel injury; however, this is not fully excluded. If a CT is repeated in the near future, consider use of IV and PO contrast. 2. Small volume pneumoperitoneum likely postsurgical in etiology. 3. Hepatomegaly and hepatic steatosis. Electronically Signed   By: MIven FinnM.D.   On: 03/21/2022 18:43            LOS: 17 days      NEmeterio Reeve DO [facility] 04/07/2022, 8:41 AM   Staff may message me via secure chat in [place] but this may not receive immediate response,  please page for urgent matters!  If 7PM-7AM, please contact night-coverage [email or web address]  Dictation software was used to generate the above note. Typos may occur and escape review, as with typed/written notes. Please contact Dr ASheppard Coildirectly for clarity if needed.

## 2022-04-08 ENCOUNTER — Inpatient Hospital Stay: Payer: No Typology Code available for payment source

## 2022-04-08 DIAGNOSIS — K9189 Other postprocedural complications and disorders of digestive system: Secondary | ICD-10-CM | POA: Diagnosis not present

## 2022-04-08 DIAGNOSIS — T8143XA Infection following a procedure, organ and space surgical site, initial encounter: Secondary | ICD-10-CM

## 2022-04-08 DIAGNOSIS — N179 Acute kidney failure, unspecified: Secondary | ICD-10-CM | POA: Diagnosis not present

## 2022-04-08 DIAGNOSIS — N739 Female pelvic inflammatory disease, unspecified: Secondary | ICD-10-CM | POA: Diagnosis not present

## 2022-04-08 DIAGNOSIS — R109 Unspecified abdominal pain: Secondary | ICD-10-CM | POA: Diagnosis not present

## 2022-04-08 LAB — COMPREHENSIVE METABOLIC PANEL
ALT: 43 U/L (ref 0–44)
AST: 40 U/L (ref 15–41)
Albumin: 2.4 g/dL — ABNORMAL LOW (ref 3.5–5.0)
Alkaline Phosphatase: 74 U/L (ref 38–126)
Anion gap: 6 (ref 5–15)
BUN: 13 mg/dL (ref 6–20)
CO2: 28 mmol/L (ref 22–32)
Calcium: 8.3 mg/dL — ABNORMAL LOW (ref 8.9–10.3)
Chloride: 104 mmol/L (ref 98–111)
Creatinine, Ser: 0.55 mg/dL (ref 0.44–1.00)
GFR, Estimated: >60 mL/min (ref >60)
Glucose, Bld: 101 mg/dL — ABNORMAL HIGH (ref 70–99)
Potassium: 4.3 mmol/L (ref 3.5–5.1)
Sodium: 138 mmol/L (ref 135–145)
Total Bilirubin: 0.7 mg/dL (ref 0.3–1.2)
Total Protein: 6 g/dL — ABNORMAL LOW (ref 6.5–8.1)

## 2022-04-08 LAB — CBC
HCT: 25.4 % — ABNORMAL LOW (ref 36.0–46.0)
Hemoglobin: 7.9 g/dL — ABNORMAL LOW (ref 12.0–15.0)
MCH: 28.1 pg (ref 26.0–34.0)
MCHC: 31.1 g/dL (ref 30.0–36.0)
MCV: 90.4 fL (ref 80.0–100.0)
Platelets: 432 10*3/uL — ABNORMAL HIGH (ref 150–400)
RBC: 2.81 MIL/uL — ABNORMAL LOW (ref 3.87–5.11)
RDW: 13.8 % (ref 11.5–15.5)
WBC: 7.2 10*3/uL (ref 4.0–10.5)
nRBC: 0.3 % — ABNORMAL HIGH (ref 0.0–0.2)

## 2022-04-08 MED ORDER — SODIUM CHLORIDE 0.9 % IV SOLN
3.0000 g | Freq: Four times a day (QID) | INTRAVENOUS | Status: AC
  Administered 2022-04-08 – 2022-04-10 (×9): 3 g via INTRAVENOUS
  Filled 2022-04-08: qty 8
  Filled 2022-04-08 (×2): qty 3
  Filled 2022-04-08: qty 8
  Filled 2022-04-08: qty 3
  Filled 2022-04-08: qty 8
  Filled 2022-04-08 (×2): qty 3
  Filled 2022-04-08: qty 8

## 2022-04-08 MED ORDER — IOHEXOL 9 MG/ML PO SOLN
500.0000 mL | ORAL | Status: AC
  Administered 2022-04-08 (×2): 500 mL via ORAL

## 2022-04-08 MED ORDER — IOHEXOL 300 MG/ML  SOLN
100.0000 mL | Freq: Once | INTRAMUSCULAR | Status: DC | PRN
Start: 2022-04-08 — End: 2022-04-11

## 2022-04-08 NOTE — Progress Notes (Signed)
Mobility Specialist - Progress Note    04/08/22 1359  Mobility  Activity Ambulated with assistance in hallway;Stood at bedside;Dangled on edge of bed  Level of Assistance Modified independent, requires aide device or extra time  Assistive Device Front wheel walker  Distance Ambulated (ft) 480 ft  Activity Response Tolerated well  $Mobility charge 1 Mobility     Pre-mobility: HR, BP, SpO2 During mobility: HR, BP, SpO2 Post-mobility: HR, BP, SPO2  Pt is sitting EOB upon arrival using RA. Completes STS indep and ambulates 4 laps around NS ModI. Denies dizziness or pain throughout. Returns to bed with needs in reach, family at bedside.  Merrily Brittle Mobility Specialist 04/08/22, 2:04 PM

## 2022-04-08 NOTE — Progress Notes (Signed)
PT Cancellation Note  Patient Details Name: Lacey Harrison MRN: [id number] DOB: May 22, 1986   Cancelled Treatment:    Reason Eval/Treat Not Completed: Other (comment) Pt in bed, recently walked with mobility tech and awaiting visitors.  Declined session at this time.  Encouraged to walk with nursing this evening.   Chesley Noon 04/08/2022, 3:32 PM

## 2022-04-08 NOTE — Consult Note (Addendum)
[place] Nurse Consult Note: Reason for Consult: Vac dressing change performed.   Wound type: Full thickness post-op wound to midline abd Pt was medicated for pain prior to the procedure and tolerated with mod amt discomfort.  Removed 1 piece black sponge.  Wound is 100% beefy red with small amt bloody drainage.  Refer to previous notes for measurements. Applied barrier ring pieces to lower and middle wound edges to attempt to maintain a seal, then applied 1 piece black foam to 146m cont suction.  WFloravilleteam will plan to change dressing again on Wed.   WClintonNurse ostomy consult note Mother in law at the bedside for pouch change; she is familiar with the steps since she had a friend with an ileostomy in the past. No contents since  pouch was recently emptied. Stoma is 85% red, 15% brown, 1 1/4 inches and slightly oval, slightly above skin level. Pt states she has been emptying in the bathroom without assistance. Applied barrier ring and 2 piece pouching system.  4 sets of supplies left at bedside; use wafer LKellie Simmering#[id number], pouch [employer] # [id number], and barrier ring LKellie Simmering# [id number] Educational materials in the room. Reviewed pouching routines and ordering supplies.  [place], [place].  Pt may eventually require convex pouches if the brown slough detaches from the stoma.  Requested 2 piece convex pouching systems from [employer] be sent to the patient's house today and also discussed convex pouches with Pt and mother in law at the bedside.  Thank-you,  DJulien GirtMSN, ROtoe [place]

## 2022-04-08 NOTE — Progress Notes (Addendum)
[facility] Day(s): 18.   Post op day(s): 12 Days Post-Op.   Interval History:  Patient seen and examined No acute events or new complaints overnight.  Patient reports pain is better managed compared to end of last week; still sore No fever, chills, nausea, emesis Leukocytosis resolved; now 7.2K Hgb stable at 7.9; ? dilutional; no evidence of bleeding Renal function normal; sCr - 0.55; UO - unmeasured No significant electrolyte derangements  Drains are as follows:   - LLQ Drain (IR): 10 ccs; serous  - RLQ Drain (IR): 10 ccs; serous Continues on meropenem; new Cx w/o growth; ID on board  Ostomy with gas/stool She is on regular diet; tolerating; decreased appetite  Progressed to [facility] with therapies   Vital signs in last 24 hours: [min-max] current  Temp:  [98.2 F (36.8 C)-98.6 F (37 C)] 98.3 F (36.8 C) (08/14 0439) Pulse Rate:  [94-100] 100 (08/14 0439) Resp:  [16] 16 (08/14 0439) BP: (96-106)/(57-70) 96/70 (08/14 0439) SpO2:  [94 %-96 %] 95 % (08/14 0439)     Height: '5\' 3"'$  (160 cm) Weight: 97.4 kg BMI (Calculated): 38.05   Intake/Output last 2 shifts:  08/13 0701 - 08/14 0700 In: -  Out: 120 [Drains:120]   Physical Exam:  Constitutional: alert, cooperative and no distress  Respiratory: breathing non-labored at rest  Cardiovascular: regular rate and sinus rhythm  Gastrointestinal: Soft, expected tenderness, non-distended, no rebound/guarding. IR drains in RLQ and LLQ output is thicker serous fluid, no gross purulence. 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; dusky, minimal bowel sweat present Integumentary: Midline wound healing via secondary intention, 27 x 6 x 6 cm; wound vac present; good seal   Labs:     Latest Ref Rng & Units 04/08/2022    4:48 AM 04/04/2022    4:50 AM 04/03/2022    5:25 AM  CBC  WBC 4.0 - 10.5 K/uL 7.2  11.8  13.7   Hemoglobin 12.0 - 15.0 g/dL 7.9  8.0  8.0   Hematocrit 36.0 - 46.0 % 25.4  25.8  25.9    Platelets 150 - 400 K/uL 432  437  392       Latest Ref Rng & Units 04/08/2022    4:48 AM 04/05/2022    5:18 AM 04/04/2022    4:50 AM  CMP  Glucose 70 - 99 mg/dL 101  131  118   BUN 6 - 20 mg/dL '13  16  9   '$ Creatinine 0.44 - 1.00 mg/dL 0.55  0.57  0.49   Sodium 135 - 145 mmol/L 138  136  136   Potassium 3.5 - 5.1 mmol/L 4.3  4.6  4.5   Chloride 98 - 111 mmol/L 104  103  102   CO2 22 - 32 mmol/L '28  27  26   '$ Calcium 8.9 - 10.3 mg/dL 8.3  8.5  8.1   Total Protein 6.5 - 8.1 g/dL 6.0   6.3   Total Bilirubin 0.3 - 1.2 mg/dL 0.7   1.2   Alkaline Phos 38 - 126 U/L 74   54   AST 15 - 41 U/L 40   27   ALT 0 - 44 U/L 43   27      Imaging studies: No new imaging studies    Assessment/Plan:  36 y.o. female 12 Days Post-Op s/p exploratory laparotomy and Hartman's procedure for perforated sigmoid colon.   - Plan for repeat CT Abdomen/Pelvis this morning  - Continue regular  diet + nutritional supplementation - Continue IV Abx (meropenem); new Cx pending; ID on board   - Monitor colostomy function; WOC RN following; I placed referral to ostomy clinic   - Wound Care: Wound Vac MWF; WOC RN assisting; will place [facility] order for home vac  - Continue IR drains; monitor and record output; flush. May be able to DC one of these before discharge   - Monitor abdominal examination - Monitor leukocytosis; resolved  - Monitor H&H; stable - Mobilizing with therapies; progressed to [facility] - Further management per primary service   - Discharge planning; Pending repeat CT Abdomen/Pelvis today. I did order home health today to determine feasibility to obtain wound vac for home. Would anticipate ready for discharge in next 48 hours.   All of the above findings and recommendations were discussed with the patient, patient's family (mother-in-law at bedside), and the medical team, and all of patient's and family's questions were answered to their expressed satisfaction.  -- [name], PA-C [facility] 04/08/2022, 7:22 AM M-F: 7am - 4pm

## 2022-04-08 NOTE — Progress Notes (Signed)
PROGRESS NOTE    Lacey Harrison   VQQ:[id number] DOB: [birth year]  DOA: 03/21/2022 Date of Service: 04/08/22 PCP: Rubie Maid, MD     Brief Narrative / Hospital Course:  36 yo F presenting to [facility] ED from home on 03/21/22 with complaints of sudden onset severe abdominal pain. She recently on 03/18/22 had robotic adhesiolysis, left salpingectomy, drainage of 500 mL from large LEFT peritoneal cyst & chromotubation. She was discharged home on 4/33 without complication. She reported being on the floor with her cat and twisting her body around, with sudden acute abdominal pain. Patient complained of severe pain in lower abdomen radiating towards her vagina.  07/27: CT imaging abscess. Leukocytosis with left shift at 16.8. Initiated on IV Zosyn and Doxycycline. Admitted to OBGYN.  07/28: IR procedure 07/28 CT drain placement into pelvic abscess, left anterior approach, 12 Fr to bulb suction. Patient developed severe sepsis/septic shock.  She required transfer to stepdown unit, due to concerns for respiratory status PCCM consulted, receiving BS abx w/ zosyn, doxycycline & vancomycin, volume resuscitation w/ IVF, awaiting culture results   07/29: Remains in SDU w/ ileus and sepsis. IV antibiotics. Repeat CT abdomen pelvis , new loculations on the right, anasarca, distended urinary bladder. No contrast extravasation to suggest bowel perforation 07/30: Continues to have abdominal pain, required NG, abdomen less distended.  07/31: repeat CT The abscess is slightly decreased compared to prior exam. Continuing on Unasyn and Diflucan.  08/01: VSS, WBC trending down, BCx NGx4d. Ileus no better. Drain in place, minimal output. NG in place. PICC and TPN initiated. Spoke w/ GYN, plan for surgical consult and ID consult.  8/02: Patient continued to have persistently elevated white cell count with abdominal pain.  General surgery has scheduled exploratory laparotomy. 8/03: She  underwent Exploratory laparotomy,  sigmoid colectomy with colostomy creation, Hartmann procedure, lysis of additions.  Continue TPN and IV antibiotics. 08/04: Leukocytosis persist could be due to stress response.  Remains afebrile, continue NPO. and TPN, wound care consult, colostomy education. 08/05: NG tube removed, Foley removed.  Patient started on clear liquid diet 08/07: WBC remains elevated, continues to have pain.  General surgery ordered CT abdomen and pelvis to rule out intra-abdominal process. 08/08: Repeat CT scan abdomen shows persistent pockets of fluid. IR performed CT-guided placement of drain x 2 into RLQ, LLQ/pelvis. 08/09: WBC improving, tolerating CLD trial FLD today.  08/10: tolerating FLD, removed surgical drain, continue IR drains and wound VAC. Continue IV Abx (meropenem); new Cx pending (NGx12h); ID on board - plan for possible discontinuing antibiotics Monday 08/14.  08/11-08/14: weaning/off TPN and weaning off IV pain meds to po. 08/14 CMP/CBC stable.  08/14: repeat CT Abd/Pelv   Consultants:  GYN PCCM IR General Surgery Infectious Disease   Procedures: 07/28 CT drain placement into pelvic abscess 07/30 NG tube placed  07/31 PICC line. 08/03 Exploratory laparotomy, sigmoid colectomy with colostomy creation, Hartmann procedure, lysis of additions 08/08: Repeat CT scan abdomen shows persistent pockets of fluid. IR performed CT-guided placement of drain x 2 into RLQ, LLQ/pelvis.    Subjective: Patient reports doing well today, off TPN, tolerating solid foods , anxious about pulling drains tomorrow      ASSESSMENT & PLAN:   Principal Problem:   Postoperative intra-abdominal abscess Active Problems:   Acute respiratory failure with hypoxia (HCC) in seting of MSKpain, atelectasis, severe sepsis    Severe sepsis ([place]) without septic shock, d/t postoperative abscess, s/p pelvic drain placement    Ileus, postoperative ([place])  AKI (acute kidney injury) ([place]) in setting of sepsis, IV contrast,  NAGMA - RESOLVED    Abdominal pain   INFECTIOUS DISEASE Severe sepsis (HCC) without septic shock - RESOLVED Postoperative intraabdominal abscess/peritonitis -  IMPROVING/STABLE 36 year old female with recent robotic lap for adhesiolysis and left salpingectomy and drainage of the left hydrosalpinx peritoneal cyst which was complicated by pelvic abscess secondary to sigmoid perforation. Initially underwent pelvic drain placement by IR.  Multiple organisms of colon origin and culture.  E. coli, Bacteroides, Clostridium perfringens and yeast. Patient underwent exploratory laparotomy and sigmoid colon resection with colostomy. Fecal peritonitis was present, she has been on Unasyn and fluconazole, changed to meropenem.  IR placed 2 additional drains 04/02/22.  ID following - Continue Abx: meropenem, fluconazole - may be able to discontinue IV antibiotics Monday, 04/08/2022, await ID recs.  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 d/t sigmoid injury/abscess/perforation 03/28/22 Exploratory laparotomy, sigmoid colectomy with colostomy creation, Hartmann procedure, lysis of additions Advancing diet Colostomy functioning 08/11 start to wean TPN --> off as of 04/07/22 and tolerating solids  General surgery following --> plan pull drains tomorrow and Weds, may need to transition wound vac to wet-to-dry since can't get home health  Bowel regimen PPI Pain control try for po meds in anticipation of d/c home later this week   RESPIRATORY Acute respiratory failure with hypoxia (HCC) in seting of MSKpain, atelectasis, severe sepsis - RESOLVED O2 prn  RENAL AKI (acute kidney injury) ([place]) in setting of sepsis, IV contrast, NAGMA - RESOLVED Serial BMP, Mg, Phos  CARDIOVASCULAR Stable  GENITOURINARY & REPRODUCTIVE Stable   NEUROLOGICAL & PSYCHIATRIC Stable Ambien prn    ENDOCRINE SSI  MUSCULOSKELETAL/RHEUM Stable  DVT prophylaxis: lovenox Code Status: FULL Family Communication: Family at bedside on rounds Disposition Plan / TOC needs: inpatient for now, may be able to go home in a few days  Barriers to discharge / significant pending items: pending abx ID recommendations, pain control, GenSurg and ID plan/clearance, will probably need IV antibiotics through Monday, 04/08/2022 but hoping for discharge later this week (unable to get [facility])              Objective: Vitals:   04/07/22 0733 04/07/22 1951 04/08/22 0439 04/08/22 0837  BP: 106/67 (!) 98/57 96/70 102/65  Pulse: 94 99 100 100  Resp: '16 16 16 18  '$ Temp: 98.6 F (37 C) 98.2 F (36.8 C) 98.3 F (36.8 C) 98.7 F (37.1 C)  TempSrc: Oral Oral Oral Oral  SpO2: 96% 94% 95% 92%  Weight:      Height:        Intake/Output Summary (Last 24 hours) at 04/08/2022 1346 Last data filed at 04/08/2022 0300 Gross per 24 hour  Intake --  Output 120 ml  Net -120 ml    Filed Weights   04/02/22 0500 04/05/22 0500 04/06/22 0601  Weight: 101.6 kg 98.2 kg 97.4 kg    Examination:  Constitutional:  VS as above General Appearance: alert, well-developed, well-nourished, NAD Eyes: Normal lids and conjunctive, non-icteric sclera Ears, Nose, Mouth, Throat: Normal external appearance MMM Neck: No masses, trachea midline Respiratory: Normal respiratory effort Neurological: No cranial nerve deficit on limited exam Alert Psychiatric: Normal judgment/insight Normal mood and affect       Scheduled Medications:   Chlorhexidine Gluconate Cloth  6 each Topical Daily   cyclobenzaprine  10 mg Oral TID   enoxaparin (LOVENOX) injection  0.5 mg/kg  Subcutaneous Q24H   feeding supplement  237 mL Oral TID BM   ketorolac  30 mg Intravenous Q6H   lidocaine  2 patch Transdermal Q24H   pantoprazole  40 mg Oral QHS   pregabalin  200 mg Oral TID   sodium chloride flush  10-40 mL Intracatheter Q12H    sodium chloride flush  5 mL Intracatheter Q8H    Continuous Infusions:  sodium chloride 10 mL/hr at 04/07/22 0103   meropenem (MERREM) IV 1 g (04/08/22 0953)    PRN Medications:  sodium chloride, albuterol, alum & mag hydroxide-simeth, bisacodyl, diazepam, diphenhydrAMINE, HYDROmorphone (DILAUDID) injection, iohexol, ondansetron **OR** ondansetron (ZOFRAN) IV, mouth rinse, oxyCODONE, simethicone, sodium chloride flush, zolpidem  Antimicrobials:  Anti-infectives (From admission, onward)    Start     Dose/Rate Route Frequency Ordered Stop   04/01/22 1500  meropenem (MERREM) 1 g in sodium chloride 0.9 % 100 mL IVPB        1 g 200 mL/hr over 30 Minutes Intravenous Every 8 hours 04/01/22 1404 04/09/22 0759   03/25/22 1600  Ampicillin-Sulbactam (UNASYN) 3 g in sodium chloride 0.9 % 100 mL IVPB  Status:  Discontinued        3 g 200 mL/hr over 30 Minutes Intravenous Every 6 hours 03/25/22 1134 04/01/22 1354   03/24/22 2200  clindamycin (CLEOCIN) IVPB 900 mg  Status:  Discontinued        900 mg 100 mL/hr over 30 Minutes Intravenous Every 8 hours 03/24/22 1854 03/25/22 1134   03/23/22 1500  fluconazole (DIFLUCAN) IVPB 400 mg        400 mg 100 mL/hr over 120 Minutes Intravenous Every 24 hours 03/23/22 1358 04/06/22 1359   03/22/22 1851  vancomycin variable dose per unstable renal function (pharmacist dosing)  Status:  Discontinued         Does not apply See admin instructions 03/22/22 1851 03/23/22 1051   03/22/22 1645  vancomycin (VANCOCIN) IVPB 1000 mg/200 mL premix  Status:  Discontinued        1,000 mg 200 mL/hr over 60 Minutes Intravenous  Once 03/22/22 1553 03/22/22 1558   03/22/22 1645  vancomycin (VANCOREADY) IVPB 2000 mg/400 mL        2,000 mg 200 mL/hr over 120 Minutes Intravenous  Once 03/22/22 1558 03/22/22 1814   03/22/22 0400  piperacillin-tazobactam (ZOSYN) IVPB 3.375 g  Status:  Discontinued        3.375 g 12.5 mL/hr over 240 Minutes Intravenous Every 8 hours 03/21/22  2047 03/25/22 1134   03/21/22 2200  doxycycline (VIBRAMYCIN) 100 mg in sodium chloride 0.9 % 250 mL IVPB  Status:  Discontinued        100 mg 125 mL/hr over 120 Minutes Intravenous Every 12 hours 03/21/22 2047 03/23/22 1358   03/21/22 1900  piperacillin-tazobactam (ZOSYN) IVPB 3.375 g        3.375 g 12.5 mL/hr over 240 Minutes Intravenous STAT 03/21/22 1853 03/21/22 2300       Data Reviewed: I have personally reviewed following labs and imaging studies  CBC: Recent Labs  Lab 04/02/22 0436 04/03/22 0525 04/04/22 0450 04/08/22 0448  WBC 19.0* 13.7* 11.8* 7.2  HGB 8.5* 8.0* 8.0* 7.9*  HCT 27.1* 25.9* 25.8* 25.4*  MCV 91.6 92.2 91.2 90.4  PLT 377 392 437* 432*    Basic Metabolic Panel: Recent Labs  Lab 04/02/22 0436 04/04/22 0450 04/05/22 0518 04/08/22 0448  NA  --  136 136 138  K  --  4.5 4.6 4.3  CL  --  102 103 104  CO2  --  '26 27 28  '$ GLUCOSE  --  118* 131* 101*  BUN  --  '9 16 13  '$ CREATININE  --  0.49 0.57 0.55  CALCIUM  --  8.1* 8.5* 8.3*  MG  --  2.2 2.2  --   PHOS 4.5 4.7* 5.1*  --     GFR: Estimated Creatinine Clearance: 108 mL/min (by C-G formula based on SCr of 0.55 mg/dL). Liver Function Tests: Recent Labs  Lab 04/04/22 0450 04/08/22 0448  AST 27 40  ALT 27 43  ALKPHOS 54 74  BILITOT 1.2 0.7  PROT 6.3* 6.0*  ALBUMIN 2.3* 2.4*    No results for input(s): "LIPASE", "AMYLASE" in the last 168 hours. No results for input(s): "AMMONIA" in the last 168 hours. Coagulation Profile: Recent Labs  Lab 04/02/22 0436  INR 1.0    Cardiac Enzymes: No results for input(s): "CKTOTAL", "CKMB", "CKMBINDEX", "TROPONINI" in the last 168 hours. BNP (last 3 results) No results for input(s): "PROBNP" in the last 8760 hours. HbA1C: No results for input(s): "HGBA1C" in the last 72 hours. CBG: Recent Labs  Lab 04/06/22 1130 04/06/22 1652 04/06/22 2107 04/06/22 2258 04/07/22 0455  GLUCAP 119* 122* 103* 110* 100*    Lipid Profile: No results for  input(s): "CHOL", "HDL", "LDLCALC", "TRIG", "CHOLHDL", "LDLDIRECT" in the last 72 hours.  Thyroid Function Tests: No results for input(s): "TSH", "T4TOTAL", "FREET4", "T3FREE", "THYROIDAB" in the last 72 hours. Anemia Panel: No results for input(s): "VITAMINB12", "FOLATE", "FERRITIN", "TIBC", "IRON", "RETICCTPCT" in the last 72 hours. Urine analysis:    Component Value Date/Time   COLORURINE RED (A) 03/24/2022 1802   APPEARANCEUR TURBID (A) 03/24/2022 1802   LABSPEC 1.045 (H) 03/24/2022 1802   PHURINE  03/24/2022 1802    TEST NOT REPORTED DUE TO COLOR INTERFERENCE OF URINE PIGMENT   GLUCOSEU (A) 03/24/2022 1802    TEST NOT REPORTED DUE TO COLOR INTERFERENCE OF URINE PIGMENT   HGBUR NEGATIVE 03/24/2022 1802   BILIRUBINUR (A) 03/24/2022 1802    TEST NOT REPORTED DUE TO COLOR INTERFERENCE OF URINE PIGMENT   KETONESUR (A) 03/24/2022 1802    TEST NOT REPORTED DUE TO COLOR INTERFERENCE OF URINE PIGMENT   PROTEINUR (A) 03/24/2022 1802    TEST NOT REPORTED DUE TO COLOR INTERFERENCE OF URINE PIGMENT   NITRITE (A) 03/24/2022 1802    TEST NOT REPORTED DUE TO COLOR INTERFERENCE OF URINE PIGMENT   LEUKOCYTESUR (A) 03/24/2022 1802    TEST NOT REPORTED DUE TO COLOR INTERFERENCE OF URINE PIGMENT   Sepsis Labs: '@LABRCNTIP'$ (procalcitonin:4,lacticidven:4)  Recent Results (from the past 240 hour(s))  Aerobic/Anaerobic Culture w Gram Stain (surgical/deep wound)     Status: None   Collection Time: 04/02/22  1:00 PM   Specimen: Abscess  Result Value Ref Range Status   Specimen Description ABSCESS  Final   Special Requests RLQ ABDOMINAL ABSCESS  Final   Gram Stain   Final    ABUNDANT WBC PRESENT,BOTH PMN AND MONONUCLEAR NO ORGANISMS SEEN    Culture   Final    No growth aerobically or anaerobically. Performed at [facility], [place] [address], [place], [place] [address]    Report Status 04/07/2022 FINAL  Final  Aerobic/Anaerobic Culture w Gram Stain (surgical/deep wound)     Status: None    Collection Time: 04/02/22  1:01 PM   Specimen: Abscess  Result Value Ref Range Status   Specimen Description ABSCESS  Final  Special Requests LLQ ABDOMINAL ABSCESS  Final   Gram Stain   Final    ABUNDANT WBC PRESENT,BOTH PMN AND MONONUCLEAR NO ORGANISMS SEEN    Culture   Final    No growth aerobically or anaerobically. Performed at [facility], [place] [address], [place], [place] [address]    Report Status 04/07/2022 FINAL  Final         Radiology Studies: DG Chest 2 View  Result Date: 03/22/2022 CLINICAL DATA:  Shortness of breath. EXAM: CHEST - 2 VIEW COMPARISON:  Two-view chest x-ray 07/26/2016.  CT chest 03/21/2022 FINDINGS: Low lung volumes exaggerate the heart size. Left lower lobe airspace opacities are again noted. No other significant airspace disease is present. IMPRESSION: Persistent left lower lobe airspace disease concerning for pneumonia. Electronically Signed   By: San Morelle M.D.   On: 03/22/2022 21:46   CT IMAGE GUIDED DRAINAGE PERCUT CATH  PERITONEAL RETROPERIT  Result Date: 03/22/2022 INDICATION: Abdominopelvic abscess EXAM: CT-guided placement of drainage catheter into abdominopelvic abscess TECHNIQUE: Multidetector CT imaging of the abdomen and pelvis was performed following the standard protocol without IV contrast. RADIATION DOSE REDUCTION: This exam was performed according to the departmental dose-optimization program which includes automated exposure control, adjustment of the mA and/or kV according to patient size and/or use of iterative reconstruction technique. MEDICATIONS: Per EMR ANESTHESIA/SEDATION: Local analgesia COMPLICATIONS: None immediate. PROCEDURE: Informed written consent was obtained from the patient after a thorough discussion of the procedural risks, benefits and alternatives. All questions were addressed. Maximal Sterile Barrier Technique was utilized including caps, mask, sterile gowns, sterile gloves, sterile drape, hand hygiene  and skin antiseptic. A timeout was performed prior to the initiation of the procedure. The patient was placed supine on the exam table. Limited CT of the abdomen and pelvis was performed for planning purposes. This again demonstrated an air in fluid collection in the lower abdomen and pelvis. Skin entry site was marked with a planned anterolateral approach from the left lower quadrant. The overlying skin was prepped and draped in a standard sterile fashion. Local analgesia was obtained with 1% lidocaine. Using intermittent CT fluoroscopy, an 18 gauge trocar needle was advanced towards the identified abdominopelvic fluid collection. Access was confirmed with CT and return of purulent material. An 035 wire was then advanced through the access needle, over which the percutaneous tract was serially dilated to accommodate a 12 [place] multipurpose locking drainage catheter. Location was again confirmed with CT and return of additional purulent material. Catheter was secured to the skin using silk suture and a dressing. It was attached to bulb suction. The patient tolerated the procedure well without immediate complication. IMPRESSION: Successful CT-guided placement of a 12 French locking drainage catheter into the lower abdominopelvic abscess. Sample sent to the lab for microbiology analysis. Electronically Signed   By: Albin Felling M.D.   On: 03/22/2022 15:12   CT ABDOMEN PELVIS W CONTRAST  Result Date: 03/21/2022 CLINICAL DATA:  Abdominal pain, post-op Abdominal pain, acute, nonlocalized. Robotic salpingectomy 03/18/2022 EXAM: CT ABDOMEN AND PELVIS WITH CONTRAST TECHNIQUE: Multidetector CT imaging of the abdomen and pelvis was performed using the standard protocol following bolus administration of intravenous contrast. RADIATION DOSE REDUCTION: This exam was performed according to the departmental dose-optimization program which includes automated exposure control, adjustment of the mA and/or kV according to  patient size and/or use of iterative reconstruction technique. CONTRAST:  123m OMNIPAQUE IOHEXOL 300 MG/ML  SOLN COMPARISON:  None Available. FINDINGS: Lower chest: Left base atelectasis.  No acute abnormality. Hepatobiliary: Liver is enlarged measuring up to 19 cm. The hepatic parenchyma is diffusely hypodense compared to the splenic parenchyma consistent with fatty infiltration. No focal liver abnormality. No gallstones, gallbladder wall thickening, or pericholecystic fluid. No biliary dilatation. Pancreas: No focal lesion. Normal pancreatic contour. No surrounding inflammatory changes. No main pancreatic ductal dilatation. Spleen: Normal in size without focal abnormality. Adrenals/Urinary Tract: No adrenal nodule bilaterally. Bilateral kidneys enhance symmetrically. No hydronephrosis. No hydroureter. The urinary bladder is unremarkable. On delayed imaging, there is no urothelial wall thickening and there are no filling defects in the opacified portions of the bilateral collecting systems or ureters as well as urinary bladder. Stomach/Bowel: Stomach is within normal limits. No evidence of bowel wall thickening or dilatation. Appendix appears normal. Vascular/Lymphatic: No abdominal aorta or iliac aneurysm. No abdominal, pelvic, or inguinal lymphadenopathy. Reproductive: Uterus is unremarkable no adnexal mass identified. Other: No intraperitoneal free fluid. Small volume free intraperitoneal gas. There is a gas and fluid collection with peripheral enhancement measuring approximately 8 x 9 x 7.5 cm centered within the left rectouterine pouch. Musculoskeletal: No abdominal wall hernia or abnormality. No suspicious lytic or blastic osseous lesions. No acute displaced fracture. IMPRESSION: 1. An 8 x 9 x 7.5cm gas and fluid collection centered within the left rectouterine pouch likely represents a developing abscess. No findings to suggest bowel injury; however, this is not fully excluded. If a CT is repeated in the  near future, consider use of IV and PO contrast. 2. Small volume pneumoperitoneum likely postsurgical in etiology. 3. Hepatomegaly and hepatic steatosis. Electronically Signed   By: Iven Finn M.D.   On: 03/21/2022 18:43            LOS: 18 days      Emeterio Reeve, DO [facility] 04/08/2022, 1:46 PM   Staff may message me via secure chat in [place]  but this may not receive immediate response,  please page for urgent matters!  If 7PM-7AM, please contact night-coverage [email or web address]  Dictation software was used to generate the above note. Typos may occur and escape review, as with typed/written notes. Please contact Dr Sheppard Coil directly for clarity if needed.

## 2022-04-08 NOTE — Progress Notes (Signed)
Occupational Therapy Treatment Patient Details Name: Lacey Harrison MRN: [id number] DOB: [birth year] Today's Date: 04/08/2022   History of present illness Pt is a 36 y.o. female presenting to hospital post op day 3 from robotic assisted laparoscopic surgery (lysis of adhesions, left salpingectomy, drainage of large peritoneal cyst, chromotubation 7/24) c/o sudden onset of severe abdominal pain.  CT drain placement into pelvic abscess 7/28.  Pt developed severe sepsis/septic shock after drainage of abdominal pelvic abscess and transferred to ICU.  Pt also noted with acute hypoxic respiratory distress and AKI; developing ileus.  S/p 8/2 exploratory laparotomy, sigmoid colectomy with colostomy creation, and Hartman's procedure for perforated sigmoid colon.  PMH includes smoking, anxiety, lyme disease, migraines, PNA, chromopertubation, cystectomy L, infertility, previous surgery for L ovarian cystectomy L for dermoid cyst complicated by cystotomy and bladder repair in 2007, pelvic pain (underwent 02/16/18 lap and extensive abdominopelvic adhesiolysis).   OT comments  Pt. required minGuard Assist to walk from the bathroom to the bed, and turn to sit on the bed. Supervision for hand hygiene standing at the sink. Pt. Education was provided about Adaptive techniques, and equipement use for LE ADLs. Pt. Was able to demonstrate donning, and doffing socks with minA with set-up, cues, and A/E. Pt. requires MaxA without it. Pt. Continues to benefit from OT services for ADL training, additional A/E training, and pt. Education about work simplification techniques, home modification, and DME. Pt. Plans to return home upon discharge with family to assist pt. as needed. Pt. Will benefit from [facility] services upon discharge.   Recommendations for follow up therapy are one component of a multi-disciplinary discharge planning process, led by the attending physician.  Recommendations may be updated based on patient status,  additional functional criteria and insurance authorization.    Follow Up Recommendations  Home health OT    Assistance Recommended at Discharge Frequent or constant Supervision/Assistance  Patient can return home with the following  A little help with walking and/or transfers;A little help with bathing/dressing/bathroom   Equipment Recommendations  BSC/3in1;Tub/shower seat    Recommendations for Other Services      Precautions / Restrictions         Mobility Bed Mobility               General bed mobility comments: Pt. seen at the EOB following toileting    Transfers Overall transfer level: Needs assistance Equipment used: Rolling walker (2 wheels)   Sit to Stand: Supervision     Step pivot transfers: Min guard           Balance   Sitting-balance support: Feet supported, Bilateral upper extremity supported Sitting balance-Leahy Scale: Good Sitting balance - Comments: steady static sitting   Standing balance support: Bilateral upper extremity supported, During functional activity, Reliant on assistive device for balance Standing balance-Leahy Scale: Good Standing balance comment: Supported with RW                           ADL either performed or assessed with clinical judgement   ADL       Grooming: Wash/dry hands;Standing;Supervision/safety       Lower Body Bathing: Maximal assistance           Toilet Transfer: Min guard                  Extremity/Trunk Assessment Upper Extremity Assessment Upper Extremity Assessment: Generalized weakness  Vision Patient Visual Report: No change from baseline     Perception     Praxis      Cognition Arousal/Alertness: Awake/alert Behavior During Therapy: WFL for tasks assessed/performed, Anxious Overall Cognitive Status: Within Functional Limits for tasks assessed                                 General Comments: Pleasant, motivated but painful         Exercises      Shoulder Instructions       General Comments      Pertinent Vitals/ Pain       Pain Assessment Pain Assessment: 0-10 Pain Score: 5  Pain Location: abdomen Pain Descriptors / Indicators: Discomfort, Guarding Pain Intervention(s): Limited activity within patient's tolerance, Monitored during session, Repositioned  Home Living                                          Prior Functioning/Environment              Frequency  Min 2X/week        Progress Toward Goals  OT Goals(current goals can now be found in the care plan section)  Progress towards OT goals: Progressing toward goals  Acute Rehab OT Goals Patient Stated Goal: To get stronger OT Goal Formulation: With patient/family Time For Goal Achievement: 04/12/22 Potential to Achieve Goals: Good  Plan Discharge plan needs to be updated    Co-evaluation                 AM-PAC OT "6 Clicks" Daily Activity     Outcome Measure   Help from another person eating meals?: None Help from another person taking care of personal grooming?: None Help from another person toileting, which includes using toliet, bedpan, or urinal?: A Little Help from another person bathing (including washing, rinsing, drying)?: A Lot Help from another person to put on and taking off regular upper body clothing?: A Little Help from another person to put on and taking off regular lower body clothing?: A Lot 6 Click Score: 18    End of Session Equipment Utilized During Treatment: Rolling walker (2 wheels)  OT Visit Diagnosis: Muscle weakness (generalized) (M62.81);Unsteadiness on feet (R26.81)   Activity Tolerance Patient tolerated treatment well   Patient Left     Nurse Communication          Time: 0802-2336 OT Time Calculation (min): 17 min  Charges: OT General Charges $OT Visit: 1 Visit OT Treatments $Self Care/Home Management : 8-22 mins  Harrel Carina, MS, OTR/L   Harrel Carina 04/08/2022, 1:54 PM

## 2022-04-08 NOTE — Progress Notes (Signed)
GYNECOLOGY PROGRESS NOTE  Patient visiting with family/friends. No exam performed today. Notes that she is very anxious about having her remaining tubes removed tomorrow as her first experience last week was very traumatic. Tried to reassure patient. Notes she plans to have all of her medications on board prior to removal. Reiterated the positives that she was moving forward in her recovery as her CT scan is showing that all of her fluid collections are much smaller and that she is able to have drains removed. No further concerns for infection at this time as well.  Patient reports she is trying to remain positive.    A total of 15 minutes were spent face-to-face with the patient during this encounter and over half of that time involved counseling and coordination of care.  Rubie Maid, MD [facility]

## 2022-04-08 NOTE — TOC Progression Note (Signed)
Transition of Care [facility]) - Progression Note    Patient Details  Name: Lacey Harrison MRN: [id number] Date of Birth: July 01, 1986  [facility]) CM/SW Contact  Beverly Sessions, RN Phone Number: 04/08/2022, 4:37 PM  Clinical Narrative:      Met with patient at bedside.  Notified her that I have been unable to find an agency that will accept her for home health services   MD in agreement that patient can change to wet to dry dressing prior to discharge.  Patient states that her mother and mother in law will be able to learn and assist with changing dressing.   Thedore Mins PA has made referral to ostomy clinic Provided patient with information on outpatient PT and ostomy clinic  Prior to discharge will have RW delivered to room, and fax order for outpatient PT Expected Discharge Plan:  (TBD) Barriers to Discharge: Continued Medical Work up  Expected Discharge Plan and Services Expected Discharge Plan:  (TBD)       Living arrangements for the past 2 months: Single Family Home                                       Social Determinants of Health (SDOH) Interventions    Readmission Risk Interventions     No data to display

## 2022-04-08 NOTE — TOC Progression Note (Signed)
Transition of Care [facility]) - Progression Note    Patient Details  Name: Lacey Harrison MRN: [id number] Date of Birth: Jul 26, 1986  Transition of Care [facility]) CM/SW Contact  Beverly Sessions, RN Phone Number: 04/08/2022, 2:40 PM  Clinical Narrative:     [name] and [facility] also confirm that they can not accept for home health.  Medical team updated.     Expected Discharge Plan:  (TBD) Barriers to Discharge: Continued Medical Work up  Expected Discharge Plan and Services Expected Discharge Plan:  (TBD)       Living arrangements for the past 2 months: Single Family Home                                       Social Determinants of Health (SDOH) Interventions    Readmission Risk Interventions     No data to display

## 2022-04-08 NOTE — Progress Notes (Signed)
Date of Admission:  03/21/2022    ID: LORRIANE DEHART is a 36 y.o. female  Principal Problem:   Postoperative intra-abdominal abscess Active Problems:   Acute respiratory failure with hypoxia (HCC) in seting of MSKpain, atelectasis, severe sepsis    Severe sepsis (HCC) without septic shock, d/t postoperative abscess, s/p pelvic drain placement    Ileus, postoperative (HCC)   AKI (acute kidney injury) ([place]) in setting of sepsis, IV contrast, NAGMA - RESOLVED    Abdominal pain    Subjective: Doing better Ambulated 3 rounds Had CT abdomen Medications:   Chlorhexidine Gluconate Cloth  6 each Topical Daily   cyclobenzaprine  10 mg Oral TID   enoxaparin (LOVENOX) injection  0.5 mg/kg Subcutaneous Q24H   feeding supplement  237 mL Oral TID BM   insulin aspart  0-15 Units Subcutaneous Q6H   insulin glargine-yfgn  10 Units Subcutaneous QHS   ketorolac  30 mg Intravenous Q6H   lidocaine  2 patch Transdermal Q24H   pantoprazole  40 mg Oral QHS   pregabalin  200 mg Oral TID   sodium chloride flush  10-40 mL Intracatheter Q12H   sodium chloride flush  5 mL Intracatheter Q8H    Objective: Vital signs in last 24 hours: Temp:  [98.2 F (36.8 C)-98.7 F (37.1 C)] 98.7 F (37.1 C) (08/14 0837) Pulse Rate:  [99-100] 100 (08/14 0837) Resp:  [16-18] 18 (08/14 0837) BP: (96-102)/(57-70) 102/65 (08/14 0837) SpO2:  [92 %-95 %] 92 % (08/14 0837)  LDA Rt PICc O/e awake and alert Chest b/l air entry Hss1s2 Abd some distension , but not like before Wound vac Both sides lower quadrant drains- left side serosanguinous Rt side a little thicker Colostomy stoma looks healthy -  Edema legs and arms much better   Lab Results Recent Labs    04/08/22 0448  WBC 7.2  HGB 7.9*  HCT 25.4*  NA 138  K 4.3  CL 104  CO2 28  BUN 13  CREATININE 0.55   Liver Panel Recent Labs    04/08/22 0448  PROT 6.0*  ALBUMIN 2.4*  AST 40  ALT 43  ALKPHOS 74  BILITOT 0.7    Microbiology:   ABUNDANT WBC PRESENT,BOTH PMN AND MONONUCLEAR  ABUNDANT GRAM NEGATIVE RODS  FEW GRAM POSITIVE RODS  FEW GRAM POSITIVE COCCI IN PAIRS  FEW BUDDING YEAST SEEN   Culture ABUNDANT ESCHERICHIA COLI  MODERATE CLOSTRIDIUM PERFRINGENS  Standardized susceptibility testing for this organism is not available.  ABUNDANT BACTEROIDES VULGATUS  BETA LACTAMASE NEGATIVE    Studies/Results: CT ABDOMEN PELVIS W CONTRAST  Result Date: 04/08/2022 CLINICAL DATA:  Intra-abdominal abscess EXAM: CT ABDOMEN AND PELVIS WITH CONTRAST TECHNIQUE: Multidetector CT imaging of the abdomen and pelvis was performed using the standard protocol following bolus administration of intravenous contrast. RADIATION DOSE REDUCTION: This exam was performed according to the departmental dose-optimization program which includes automated exposure control, adjustment of the mA and/or kV according to patient size and/or use of iterative reconstruction technique. CONTRAST:  <See Chart> OMNIPAQUE IOHEXOL 300 MG/ML  SOLN COMPARISON:  CT 04/01/2022 FINDINGS: Lower chest: Bibasilar atelectasis. Hepatobiliary: Hepatic steatosis. Layering hyperdensity in the gallbladder, likely vicarious excretion of contrast. Pancreas: No ductal dilation or significant peripancreatic inflammatory change Spleen: Normal in size without focal abnormality. Adrenals/Urinary Tract: Adrenal glands are unremarkable. No hydronephrosis or nephrolithiasis. Moderate bladder distension. Stomach/Bowel: Left upper quadrant colostomy. Residual oral contrast material within small bowel. Oral contrast had passed into the colon on the previous exam.  No evidence of bowel obstruction.Generalized mesenteric edema, similar to prior. Mild generalized bowel distension compatible with ileus. Vascular/Lymphatic: No significant vascular findings. No lymphadenopathy. Reproductive: Unremarkable. Other: Recent abdominal incision with some packing material. Decreased size of the anterior right  hemiabdomen fluid collection after percutaneous pigtail drainage catheter placement, now measuring 8.6 x 2.4 cm, previously 13.9 x 4.8 cm (series 2, image 50). Surgical drain has been removed. There is a new percutaneous pigtail catheter placed in the pelvis with significantly decreased size of the pelvic fluid collection, measuring 5.7 x 1.0 cm, previously 11.5 x 5.4 cm (series 2, image 71). Punctate focus of gas remains in the pelvis. Musculoskeletal: No acute or significant osseous findings. IMPRESSION: Decreased size of the abdominopelvic fluid collections after percutaneous pigtail drainage catheter placement, measurements above. Mild diffuse bowel distension compatible with reactive/postoperative ileus. Punctate residual pneumoperitoneum in the pelvis. Bibasilar atelectasis. Electronically Signed   By: Maurine Simmering M.D.   On: 04/08/2022 09:28     Assessment/Plan: 36 year old female with recent robotic lap for adhesiolysis and left salpingectomy and drainage of the left hydrosalpinx peritoneal cyst which was complicated by pelvic abscess secondary to sigmoid perforation Initially underwent pelvic drain placement by IR.  Multiple organisms of colon origin and culture.  E. coli, Bacteroides, Clostridium perfringens and yeast.   Patient underwent exploratory laparotomy and sigmoid colon resection with colostomy.Fecal peritonitis was present Initially on Unasyn and fluconazole--  Because of worsening leucocytosis changed unasyn to meropenem on 04/02/22  CT abdomen showed further collection s/p 2 more drain placement on 04/02/22 - culture in both drains negativ Leucocytosis resolved-  Fever has resolved CT abdomen - size of abscesses smaller Change Meropenem to unasyn for another 48 hrs - until 04/10/22 Anemia  AKI has resolved  Septic shock has resolved  Hypoalbuminemia  Hyperbilirubinemia - resolved  Discussed the management  with patient and family.

## 2022-04-08 NOTE — Progress Notes (Signed)
Mobility Specialist - Progress Note    04/08/22 1300  Mobility  Activity Ambulated independently to bathroom;Stood at bedside;Dangled on edge of bed  Level of Assistance Standby assist, set-up cues, supervision of patient - no hands on  Assistive Device Front wheel walker  Distance Ambulated (ft) 10 ft  Activity Response Tolerated well  $Mobility charge 1 Mobility    Pt sitting in bed upon arrival using RA. Agreeable and motivated to ambulate as she voices being tired of lying in bed all day. Completes bed mobility ModI, STS with supervision and stands w/o RW ~ 1 minute with no LOB. Ambulates to bathroom wi/ RW and supervision, family helps with pericare. Session intercepted by OT. Will attempt to finish at another date and time.  Merrily Brittle Mobility Specialist 04/08/22, 1:38 PM

## 2022-04-09 DIAGNOSIS — N179 Acute kidney failure, unspecified: Secondary | ICD-10-CM | POA: Diagnosis not present

## 2022-04-09 DIAGNOSIS — A419 Sepsis, unspecified organism: Secondary | ICD-10-CM | POA: Diagnosis not present

## 2022-04-09 DIAGNOSIS — N739 Female pelvic inflammatory disease, unspecified: Secondary | ICD-10-CM | POA: Diagnosis not present

## 2022-04-09 DIAGNOSIS — K9189 Other postprocedural complications and disorders of digestive system: Secondary | ICD-10-CM | POA: Diagnosis not present

## 2022-04-09 DIAGNOSIS — T8143XA Infection following a procedure, organ and space surgical site, initial encounter: Secondary | ICD-10-CM | POA: Diagnosis not present

## 2022-04-09 DIAGNOSIS — R109 Unspecified abdominal pain: Secondary | ICD-10-CM | POA: Diagnosis not present

## 2022-04-09 LAB — CBC
HCT: 25.7 % — ABNORMAL LOW (ref 36.0–46.0)
Hemoglobin: 8 g/dL — ABNORMAL LOW (ref 12.0–15.0)
MCH: 28.1 pg (ref 26.0–34.0)
MCHC: 31.1 g/dL (ref 30.0–36.0)
MCV: 90.2 fL (ref 80.0–100.0)
Platelets: 385 10*3/uL (ref 150–400)
RBC: 2.85 MIL/uL — ABNORMAL LOW (ref 3.87–5.11)
RDW: 13.8 % (ref 11.5–15.5)
WBC: 5.4 10*3/uL (ref 4.0–10.5)
nRBC: 0 % (ref 0.0–0.2)

## 2022-04-09 MED ORDER — DIAZEPAM 5 MG/ML IJ SOLN
5.0000 mg | Freq: Four times a day (QID) | INTRAMUSCULAR | Status: DC | PRN
  Administered 2022-04-10: 5 mg via INTRAVENOUS
  Filled 2022-04-09: qty 2

## 2022-04-09 MED ORDER — KETOROLAC TROMETHAMINE 30 MG/ML IJ SOLN
30.0000 mg | Freq: Four times a day (QID) | INTRAMUSCULAR | Status: DC
Start: 2022-04-09 — End: 2022-04-11
  Administered 2022-04-09 – 2022-04-11 (×9): 30 mg via INTRAVENOUS
  Filled 2022-04-09 (×9): qty 1

## 2022-04-09 NOTE — TOC Progression Note (Signed)
Transition of Care [facility]) - Progression Note    Patient Details  Name: Lacey Harrison MRN: [id number] Date of Birth: 1986-03-11  Transition [facility] Care [facility]) CM/SW Contact  Beverly Sessions, RN Phone Number: 04/09/2022, 3:22 PM  Clinical Narrative:     RW referral made to [facility] with adapt, to be delivered to room prior to DC  Expected Discharge Plan:  (TBD) Barriers to Discharge: Continued Medical Work up  Expected Discharge Plan and Services Expected Discharge Plan:  (TBD)       Living arrangements for the past 2 months: Single Family Home                                       Social Determinants of Health (SDOH) Interventions    Readmission Risk Interventions     No data to display

## 2022-04-09 NOTE — Progress Notes (Cosign Needed)
     [place] REFERRAL        Occupational Therapy * Physical Therapy * Speech Therapy                           DATE  PATIENT NAME   PATIENT MRN        DIAGNOSIS/DIAGNOSIS CODE T81.43XA  DATE OF DISCHARGE: 04/10/22       PRIMARY CARE PHYSICIAN    Wells Guiles  PCP PHONE/FAX      Dear Provider (Name: [facility] outpatient __  Fax: [phone]   I certify that I have examined this patient and that occupational/physical/speech therapy is necessary on an outpatient basis.    The patient has expressed interest in completing their recommended course of therapy at your  location.  Once a formal order from the patient's primary care physician has been obtained, please  contact him/her to schedule an appointment for evaluation at your earliest convenience.   [ x]  Physical Therapy Evaluate and Treat  [ x ]  Occupational Therapy Evaluate and Treat  [  ]  Speech Therapy Evaluate and Treat         The patient's primary care physician (listed above) must furnish and be responsible for a formal order such that the recommended services may be furnished while under the primary physician's care, and that the plan of care will be established and reviewed every 30 days (or more often if condition necessitates).

## 2022-04-09 NOTE — Progress Notes (Signed)
[place] Hospital Day(s): [place] op day(s): 13 Days Post-Op.   Interval History:  Patient seen and examined No acute events or new complaints overnight.  Patient reports she is anxious about drain removal; pain improving No fever, chills, nausea, emesis No new labs this morning  Drains are as follows:   - LLQ Drain (IR): unmeasured; serous  - RLQ Drain (IR): unmeasured; serous Switched back to Unasyn; new Cx w/o growth; ID on board  Ostomy with gas/stool She is on regular diet; tolerating; decreased appetite  Progressed to [facility] with therapies   Vital signs in last 24 hours: [min-max] current  Temp:  [97.6 F (36.4 C)-98.7 F (37.1 C)] 97.6 F (36.4 C) (08/15 0656) Pulse Rate:  [90-105] 90 (08/15 0656) Resp:  [15-18] 15 (08/15 0656) BP: (96-104)/(62-72) 96/62 (08/15 0656) SpO2:  [92 %-96 %] 95 % (08/15 0656) Weight:  [94.5 kg] 94.5 kg (08/15 0500)     Height: '5\' 3"'$  (160 cm) Weight: 94.5 kg BMI (Calculated): 36.91   Intake/Output last 2 shifts:  08/14 0701 - 08/15 0700 In: 962.5 [I.V.:762.5; IV Piggyback:200] Out: -    Physical Exam:  Constitutional: alert, cooperative and no distress  Respiratory: breathing non-labored at rest  Cardiovascular: regular rate and sinus rhythm  Gastrointestinal: Soft, expected tenderness, non-distended, no rebound/guarding. IR drains in RLQ and LLQ output is thicker serous fluid, no gross purulence. 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; dusky, minimal bowel sweat present Integumentary: Midline wound healing via secondary intention, 27 x 6 x 6 cm; wound vac present; good seal   Labs:     Latest Ref Rng & Units 04/08/2022    4:48 AM 04/04/2022    4:50 AM 04/03/2022    5:25 AM  CBC  WBC 4.0 - 10.5 K/uL 7.2  11.8  13.7   Hemoglobin 12.0 - 15.0 g/dL 7.9  8.0  8.0   Hematocrit 36.0 - 46.0 % 25.4  25.8  25.9   Platelets 150 - 400 K/uL 432  437  392       Latest Ref Rng & Units 04/08/2022    4:48 AM  04/05/2022    5:18 AM 04/04/2022    4:50 AM  CMP  Glucose 70 - 99 mg/dL 101  131  118   BUN 6 - 20 mg/dL '13  16  9   '$ Creatinine 0.44 - 1.00 mg/dL 0.55  0.57  0.49   Sodium 135 - 145 mmol/L 138  136  136   Potassium 3.5 - 5.1 mmol/L 4.3  4.6  4.5   Chloride 98 - 111 mmol/L 104  103  102   CO2 22 - 32 mmol/L '28  27  26   '$ Calcium 8.9 - 10.3 mg/dL 8.3  8.5  8.1   Total Protein 6.5 - 8.1 g/dL 6.0   6.3   Total Bilirubin 0.3 - 1.2 mg/dL 0.7   1.2   Alkaline Phos 38 - 126 U/L 74   54   AST 15 - 41 U/L 40   27   ALT 0 - 44 U/L 43   27      Imaging studies: No new imaging studies    Assessment/Plan:  36 y.o. female 13 Days Post-Op s/p exploratory laparotomy and Hartman's procedure for perforated sigmoid colon.   - Continue regular diet + nutritional supplementation - Continue IV Abx (Back to Unasyn until 08/16); new Cx without growth; ID on board  - Monitor colostomy function; WOC  RN following; I placed referral to ostomy clinic   - Wound Care: Wound Vac MWF; WOC RN assisting; will unfortunately need to transition back to wet to dry dressings for home.   - Will DC RLQ drain this morning; plan to DC LLQ before discharge  - Monitor abdominal examination - Mobilizing with therapies; progressed to [facility] - Further management per primary service   - Discharge planning: Anticipate ready for DC tomorrow. Discussed home health plans and wound care with CSW and medicine yesterday. Will start removing drains today/tomorrow.   All of the above findings and recommendations were discussed with the patient, patient's family (mother-in-law at bedside), and the medical team, and all of patient's and family's questions were answered to their expressed satisfaction.  -- [name], PA-C [facility] 04/09/2022, 7:26 AM M-F: 7am - 4pm

## 2022-04-09 NOTE — Plan of Care (Signed)
Pt AAOx4, mild abdominal pain. New colostomy with minimal liquid output. R drain removed by PA with no complications. Plan for pain control and mobility as tolerated. Bed is in lowest position, call light within reach. Will continue to monitor.

## 2022-04-09 NOTE — Progress Notes (Signed)
Pt seen and examined. Doing well, main issue remains pain. I had extensive d/w the pt regarding long term plan. We will do multimodal therapy. She also understands that I wont be able to be her pain doctor and we may need to send her to the pain clinic. Encourage her to use nsaids, ice and other non narcotic forms. Plan for DC tomorrow

## 2022-04-09 NOTE — Progress Notes (Signed)
Physical Therapy Treatment Patient Details Name: Lacey Harrison MRN: [id number] DOB: [birth year] Today's Date: 04/09/2022   History of Present Illness Pt is a 36 y.o. female presenting to hospital post op day 3 from robotic assisted laparoscopic surgery (lysis of adhesions, left salpingectomy, drainage of large peritoneal cyst, chromotubation 7/24) c/o sudden onset of severe abdominal pain.  CT drain placement into pelvic abscess 7/28.  Pt developed severe sepsis/septic shock after drainage of abdominal pelvic abscess and transferred to ICU.  Pt also noted with acute hypoxic respiratory distress and AKI; developing ileus.  S/p 8/2 exploratory laparotomy, sigmoid colectomy with colostomy creation, and Hartman's procedure for perforated sigmoid colon.  PMH includes smoking, anxiety, lyme disease, migraines, PNA, chromopertubation, cystectomy L, infertility, previous surgery for L ovarian cystectomy L for dermoid cyst complicated by cystotomy and bladder repair in 2007, pelvic pain (underwent 02/16/18 lap and extensive abdominopelvic adhesiolysis).    PT Comments    Pt making steady progress with functional mobility. She was able to tolerate further ambulation this session, with continued use of RW for stability. She rated her pain at a 2/10 during ambulation. Per pt report, plan is to d/c home tomorrow. Pt would continue to benefit from skilled physical therapy services at this time while admitted and after d/c to address the below listed limitations in order to improve overall safety and independence with functional mobility.    Recommendations for follow up therapy are one component of a multi-disciplinary discharge planning process, led by the attending physician.  Recommendations may be updated based on patient status, additional functional criteria and insurance authorization.  Follow Up Recommendations  Outpatient PT     Assistance Recommended at Discharge Set up Supervision/Assistance  Patient  can return home with the following A little help with bathing/dressing/bathroom;Assistance with cooking/housework;Direct supervision/assist for medications management;Assist for transportation;Help with stairs or ramp for entrance   Equipment Recommendations  Rolling walker (2 wheels)    Recommendations for Other Services       Precautions / Restrictions Precautions Precautions: Fall Precaution Comments: JP drain L abdomen, picc line, long abdominal incision; L UQ colostomy, drain on R side Restrictions Weight Bearing Restrictions: No     Mobility  Bed Mobility Overal bed mobility: Needs Assistance Bed Mobility: Supine to Sit     Supine to sit: Supervision     General bed mobility comments: increased time needed    Transfers Overall transfer level: Needs assistance Equipment used: None Transfers: Sit to/from Stand Sit to Stand: Supervision           General transfer comment: slow, steady rise into standing from EOB    Ambulation/Gait Ambulation/Gait assistance: Supervision Gait Distance (Feet): 500 Feet Assistive device: Rolling walker (2 wheels) Gait Pattern/deviations: Step-through pattern Gait velocity: decreased     General Gait Details: pt with slow, steady gait with use of RW; no rest breaks needed; no LOB or need for additional external assist   Stairs             Wheelchair Mobility    Modified Rankin (Stroke Patients Only)       Balance Overall balance assessment: Needs assistance Sitting-balance support: Feet supported Sitting balance-Leahy Scale: Good     Standing balance support: During functional activity, No upper extremity supported Standing balance-Leahy Scale: Fair                              Cognition Arousal/Alertness: Awake/alert Behavior During Therapy: [facility]  for tasks assessed/performed, Anxious Overall Cognitive Status: Within Functional Limits for tasks assessed                                           Exercises      General Comments        Pertinent Vitals/Pain Pain Assessment Pain Assessment: 0-10 Pain Score: 2  Pain Location: abdomen Pain Descriptors / Indicators: Discomfort, Guarding Pain Intervention(s): Monitored during session, Repositioned, Premedicated before session    Home Living                          Prior Function            PT Goals (current goals can now be found in the care plan section) Acute Rehab PT Goals PT Goal Formulation: With patient/family Time For Goal Achievement: 04/12/22 Potential to Achieve Goals: Good Progress towards PT goals: Progressing toward goals    Frequency    7X/week      PT Plan Discharge plan needs to be updated    Co-evaluation              AM-PAC PT "6 Clicks" Mobility   Outcome Measure  Help needed turning from your back to your side while in a flat bed without using bedrails?: None Help needed moving from lying on your back to sitting on the side of a flat bed without using bedrails?: None Help needed moving to and from a bed to a chair (including a wheelchair)?: None Help needed standing up from a chair using your arms (e.g., wheelchair or bedside chair)?: None Help needed to walk in hospital room?: A Little Help needed climbing 3-5 steps with a railing? : A Little 6 Click Score: 22    End of Session   Activity Tolerance: Patient tolerated treatment well Patient left: in bed;with call bell/phone within reach;with family/visitor present;Other (comment) (sitting EOB) Nurse Communication: Mobility status PT Visit Diagnosis: Other abnormalities of gait and mobility (R26.89);Muscle weakness (generalized) (M62.81);Pain Pain - part of body:  (abdomen)     Time: 2633-3545 PT Time Calculation (min) (ACUTE ONLY): 36 min  Charges:  $Gait Training: 23-37 mins                     Anastasio Champion, DPT  Acute Rehabilitation Services Office [place] 04/09/2022, 10:43 AM

## 2022-04-09 NOTE — Progress Notes (Signed)
Date of Admission:  03/21/2022    ID: Lacey Harrison is a 36 y.o. female  Principal Problem:   Postoperative intra-abdominal abscess Active Problems:   Acute respiratory failure with hypoxia (HCC) in seting of MSKpain, atelectasis, severe sepsis    Severe sepsis (HCC) without septic shock, d/t postoperative abscess, s/p pelvic drain placement    Ileus, postoperative (HCC)   AKI (acute kidney injury) ([place]) in setting of sepsis, IV contrast, NAGMA - RESOLVED    Abdominal pain    Subjective: Doing better Another drain from RLQ was removed today Medications:   Chlorhexidine Gluconate Cloth  6 each Topical Daily   cyclobenzaprine  10 mg Oral TID   enoxaparin (LOVENOX) injection  0.5 mg/kg Subcutaneous Q24H   feeding supplement  237 mL Oral TID BM   ketorolac  30 mg Intravenous Q6H   lidocaine  2 patch Transdermal Q24H   pantoprazole  40 mg Oral QHS   pregabalin  200 mg Oral TID   sodium chloride flush  10-40 mL Intracatheter Q12H   sodium chloride flush  5 mL Intracatheter Q8H    Objective: Vital signs in last 24 hours: Temp:  [97.6 F (36.4 C)-98.6 F (37 C)] 98.2 F (36.8 C) (08/15 1553) Pulse Rate:  [90-101] 101 (08/15 1553) Resp:  [15-18] 16 (08/15 1553) BP: (96-104)/(62-73) 104/68 (08/15 1553) SpO2:  [93 %-95 %] 93 % (08/15 1553) Weight:  [94.5 kg] 94.5 kg (08/15 0500)  LDA Rt PICc O/e awake and alert Chest b/l air entry Hss1s2 Abd soft ,  Wound vac over lap incision Left  lower quadrant drains- left side serosanguinous Colostomy stoma looks healthy -  Edema legs and arms much better   Lab Results Recent Labs    04/08/22 0448 04/09/22 0855  WBC 7.2 5.4  HGB 7.9* 8.0*  HCT 25.4* 25.7*  NA 138  --   K 4.3  --   CL 104  --   CO2 28  --   BUN 13  --   CREATININE 0.55  --    Liver Panel Recent Labs    04/08/22 0448  PROT 6.0*  ALBUMIN 2.4*  AST 40  ALT 43  ALKPHOS 74  BILITOT 0.7    Microbiology:  ABUNDANT WBC PRESENT,BOTH PMN AND  MONONUCLEAR  ABUNDANT GRAM NEGATIVE RODS  FEW GRAM POSITIVE RODS  FEW GRAM POSITIVE COCCI IN PAIRS  FEW BUDDING YEAST SEEN   Culture ABUNDANT ESCHERICHIA COLI  MODERATE CLOSTRIDIUM PERFRINGENS  Standardized susceptibility testing for this organism is not available.  ABUNDANT BACTEROIDES VULGATUS  BETA LACTAMASE NEGATIVE    Studies/Results: CT ABDOMEN PELVIS W CONTRAST  Result Date: 04/08/2022 CLINICAL DATA:  Intra-abdominal abscess EXAM: CT ABDOMEN AND PELVIS WITH CONTRAST TECHNIQUE: Multidetector CT imaging of the abdomen and pelvis was performed using the standard protocol following bolus administration of intravenous contrast. RADIATION DOSE REDUCTION: This exam was performed according to the departmental dose-optimization program which includes automated exposure control, adjustment of the mA and/or kV according to patient size and/or use of iterative reconstruction technique. CONTRAST:  <See Chart> OMNIPAQUE IOHEXOL 300 MG/ML  SOLN COMPARISON:  CT 04/01/2022 FINDINGS: Lower chest: Bibasilar atelectasis. Hepatobiliary: Hepatic steatosis. Layering hyperdensity in the gallbladder, likely vicarious excretion of contrast. Pancreas: No ductal dilation or significant peripancreatic inflammatory change Spleen: Normal in size without focal abnormality. Adrenals/Urinary Tract: Adrenal glands are unremarkable. No hydronephrosis or nephrolithiasis. Moderate bladder distension. Stomach/Bowel: Left upper quadrant colostomy. Residual oral contrast material within small bowel. Oral contrast had  passed into the colon on the previous exam. No evidence of bowel obstruction.Generalized mesenteric edema, similar to prior. Mild generalized bowel distension compatible with ileus. Vascular/Lymphatic: No significant vascular findings. No lymphadenopathy. Reproductive: Unremarkable. Other: Recent abdominal incision with some packing material. Decreased size of the anterior right hemiabdomen fluid collection after  percutaneous pigtail drainage catheter placement, now measuring 8.6 x 2.4 cm, previously 13.9 x 4.8 cm (series 2, image 50). Surgical drain has been removed. There is a new percutaneous pigtail catheter placed in the pelvis with significantly decreased size of the pelvic fluid collection, measuring 5.7 x 1.0 cm, previously 11.5 x 5.4 cm (series 2, image 71). Punctate focus of gas remains in the pelvis. Musculoskeletal: No acute or significant osseous findings. IMPRESSION: Decreased size of the abdominopelvic fluid collections after percutaneous pigtail drainage catheter placement, measurements above. Mild diffuse bowel distension compatible with reactive/postoperative ileus. Punctate residual pneumoperitoneum in the pelvis. Bibasilar atelectasis. Electronically Signed   By: Maurine Simmering M.D.   On: 04/08/2022 09:28     Assessment/Plan: 36 year old female with recent robotic lap for adhesiolysis and left salpingectomy and drainage of the left hydrosalpinx peritoneal cyst which was complicated by pelvic abscess secondary to sigmoid perforation Initially underwent pelvic drain placement by IR.  Multiple organisms of colon origin and culture.  E. coli, Bacteroides, Clostridium perfringens and yeast.   Patient underwent exploratory laparotomy and sigmoid colon resection with colostomy.Fecal peritonitis was present Initially on Unasyn and fluconazole--  Because of worsening leucocytosis changed unasyn to meropenem on 04/02/22  CT abdomen showed further collection s/p 2 more drain placement on 04/02/22 - culture in both drains negativ Leucocytosis resolved-  Fever has resolved CT abdomen - size of abscesses smaller Day 14 of antibiotic from day of surgery -Changed Meropenem to unasyn yesterday and will DC after tonight  Anemia  AKI has resolved  Septic shock has resolved  Hypoalbuminemia  Hyperbilirubinemia - resolved  Discussed the management  with patient and surgeons ID will sign off- call if needed

## 2022-04-09 NOTE — Progress Notes (Signed)
PROGRESS NOTE    MARLIES LIGMAN   ZOX:[id number] DOB: 1986/07/26  DOA: 03/21/2022 Date of Service: 04/09/22 PCP: Rubie Maid, MD     Brief Narrative / Hospital Course:  36 yo F presenting to [facility] ED from home on 03/21/22 with complaints of sudden onset severe abdominal pain. She recently on 03/18/22 had robotic adhesiolysis, left salpingectomy, drainage of 500 mL from large LEFT peritoneal cyst & chromotubation. She was discharged home on 8/11 without complication. She reported being on the floor with her cat and twisting her body around, with sudden acute abdominal pain. Patient complained of severe pain in lower abdomen radiating towards her vagina.  07/27: CT imaging abscess. Leukocytosis with left shift at 16.8. Initiated on IV Zosyn and Doxycycline. Admitted to OBGYN.  07/28: IR procedure 07/28 CT drain placement into pelvic abscess, left anterior approach, 12 Fr to bulb suction. Patient developed severe sepsis/septic shock.  She required transfer to stepdown unit, due to concerns for respiratory status PCCM consulted, receiving BS abx w/ zosyn, doxycycline & vancomycin, volume resuscitation w/ IVF, awaiting culture results   07/29: Remains in SDU w/ ileus and sepsis. IV antibiotics. Repeat CT abdomen pelvis , new loculations on the right, anasarca, distended urinary bladder. No contrast extravasation to suggest bowel perforation 07/30: Continues to have abdominal pain, required NG, abdomen less distended.  07/31: repeat CT The abscess is slightly decreased compared to prior exam. Continuing on Unasyn and Diflucan.  08/01: VSS, WBC trending down, BCx NGx4d. Ileus no better. Drain in place, minimal output. NG in place. PICC and TPN initiated. Spoke w/ GYN, plan for surgical consult and ID consult.  8/02: Patient continued to have persistently elevated white cell count with abdominal pain.  General surgery has scheduled exploratory laparotomy. 8/03: She  underwent Exploratory laparotomy,  sigmoid colectomy with colostomy creation, Hartmann procedure, lysis of additions.  Continue TPN and IV antibiotics. 08/04: Leukocytosis persist could be due to stress response.  Remains afebrile, continue NPO. and TPN, wound care consult, colostomy education. 08/05: NG tube removed, Foley removed.  Patient started on clear liquid diet 08/07: WBC remains elevated, continues to have pain.  General surgery ordered CT abdomen and pelvis to rule out intra-abdominal process. 08/08: Repeat CT scan abdomen shows persistent pockets of fluid. IR performed CT-guided placement of drain x 2 into RLQ, LLQ/pelvis. 08/09: WBC improving, tolerating CLD trial FLD today.  08/10: tolerating FLD, removed surgical drain, continue IR drains and wound VAC. Continue IV Abx (meropenem); new Cx pending (NGx12h); ID on board - plan for possible discontinuing antibiotics Monday 08/14.  08/11-08/13: weaning/off TPN and weaning off IV pain meds to po. 08/14  08/14-08/16: CMP/CBC stable. repeat CT Abd/Pelv: improved fluid collections, mild bowel distension. Gen surg planning pull drains 08/15 and 08/16, and will order to ostomy clinic. Unable to get home health services, may need to transition wound vac to wet-to-dry prior to discharge. Anticipate appropriate for discharge 08/16   Consultants:  GYN PCCM IR General Surgery Infectious Disease   Procedures: 07/28 CT drain placement into pelvic abscess 07/30 NG tube placed  07/31 PICC line. 08/03 Exploratory laparotomy, sigmoid colectomy with colostomy creation, Hartmann procedure, lysis of additions 08/08: Repeat CT scan abdomen shows persistent pockets of fluid. IR performed CT-guided placement of drain x 2 into RLQ, LLQ/pelvis.    Subjective: Patient reports doing well today, off TPN, tolerating solid foods , anxious about pulling drains tomorrow      ASSESSMENT & PLAN:   Principal Problem:  Postoperative intra-abdominal abscess Active Problems:   Acute  respiratory failure with hypoxia (HCC) in seting of MSKpain, atelectasis, severe sepsis    Severe sepsis (HCC) without septic shock, d/t postoperative abscess, s/p pelvic drain placement    Ileus, postoperative (HCC)   AKI (acute kidney injury) ([place]) in setting of sepsis, IV contrast, NAGMA - RESOLVED    Abdominal pain   INFECTIOUS DISEASE Severe sepsis (HCC) without septic shock - RESOLVED Postoperative intraabdominal abscess/peritonitis -  IMPROVING/STABLE 36 year old female with recent robotic lap for adhesiolysis and left salpingectomy and drainage of the left hydrosalpinx peritoneal cyst which was complicated by pelvic abscess secondary to sigmoid perforation. Initially underwent pelvic drain placement by IR.  Multiple organisms of colon origin and culture.  E. coli, Bacteroides, Clostridium perfringens and yeast. Patient underwent exploratory laparotomy and sigmoid colon resection with colostomy. Fecal peritonitis was present, she has been on Unasyn and fluconazole, changed to meropenem.  IR placed 2 additional drains 04/02/22.  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 d/t sigmoid injury/abscess/perforation 03/28/22 Exploratory laparotomy, sigmoid colectomy with colostomy creation, Hartmann procedure, lysis of additions Advancing diet off TPN Colostomy functioning General surgery following --> plan pull drains tomorrow and Weds, may need to transition wound vac to wet-to-dry since can't get home health  Bowel regimen PPI Pain control goal for po meds only in anticipation of d/c home  RESPIRATORY Acute respiratory failure with hypoxia (HCC) in seting of MSKpain, atelectasis, severe sepsis - RESOLVED O2 prn  RENAL AKI (acute kidney injury) ([place]) in setting of sepsis, IV contrast, NAGMA - RESOLVED Serial BMP, Mg, Phos  CARDIOVASCULAR Stable  GENITOURINARY & REPRODUCTIVE Stable   NEUROLOGICAL & PSYCHIATRIC Stable Ambien  prn   ENDOCRINE SSI d/c off TPN   MUSCULOSKELETAL/RHEUM Stable  DVT prophylaxis: lovenox Code Status: FULL Family Communication: Family at bedside on rounds Disposition Plan / TOC needs: inpatient for now, may be able to go home tm Barriers to discharge / significant pending items: pending abx ID recommendations, pain control, GenSurg and ID plan/clearance, IV abx until 08/16             Objective: Vitals:   04/08/22 2046 04/09/22 0500 04/09/22 0656 04/09/22 0925  BP: 103/72  96/62 100/73  Pulse: (!) 101  90 93  Resp: '16  15 18  '$ Temp: 98.6 F (37 C)  97.6 F (36.4 C) 97.7 F (36.5 C)  TempSrc: Oral  Oral Oral  SpO2: 94%  95% 93%  Weight:  94.5 kg    Height:        Intake/Output Summary (Last 24 hours) at 04/09/2022 1521 Last data filed at 04/09/2022 1000 Gross per 24 hour  Intake 1342.5 ml  Output --  Net 1342.5 ml   Filed Weights   04/05/22 0500 04/06/22 0601 04/09/22 0500  Weight: 98.2 kg 97.4 kg 94.5 kg    Examination:  Constitutional:  VS as above General Appearance: alert, well-developed, well-nourished, NAD Eyes: Normal lids and conjunctive, non-icteric sclera Ears, Nose, Mouth, Throat: Normal external appearance MMM Neck: No masses, trachea midline Respiratory: Normal respiratory effort Neurological: No cranial nerve deficit on limited exam Alert Psychiatric: Normal judgment/insight Normal mood and affect       Scheduled Medications:   Chlorhexidine Gluconate Cloth  6 each Topical Daily   cyclobenzaprine  10 mg Oral TID   enoxaparin (LOVENOX) injection  0.5 mg/kg Subcutaneous Q24H   feeding supplement  237 mL Oral TID BM   ketorolac  30 mg Intravenous Q6H   lidocaine  2 patch Transdermal Q24H   pantoprazole  40 mg Oral QHS   pregabalin  200 mg Oral TID   sodium chloride flush  10-40 mL Intracatheter Q12H   sodium chloride flush  5 mL Intracatheter Q8H    Continuous Infusions:  sodium chloride 10 mL/hr at 04/09/22 1340    ampicillin-sulbactam (UNASYN) IV 3 g (04/09/22 1244)    PRN Medications:  sodium chloride, albuterol, alum & mag hydroxide-simeth, bisacodyl, diazepam, diphenhydrAMINE, HYDROmorphone (DILAUDID) injection, iohexol, ondansetron **OR** ondansetron (ZOFRAN) IV, mouth rinse, oxyCODONE, simethicone, sodium chloride flush, zolpidem  Antimicrobials:  Anti-infectives (From admission, onward)    Start     Dose/Rate Route Frequency Ordered Stop   04/08/22 1800  Ampicillin-Sulbactam (UNASYN) 3 g in sodium chloride 0.9 % 100 mL IVPB        3 g 200 mL/hr over 30 Minutes Intravenous Every 6 hours 04/08/22 1437 04/10/22 2359   04/01/22 1500  meropenem (MERREM) 1 g in sodium chloride 0.9 % 100 mL IVPB  Status:  Discontinued        1 g 200 mL/hr over 30 Minutes Intravenous Every 8 hours 04/01/22 1404 04/08/22 1434   03/25/22 1600  Ampicillin-Sulbactam (UNASYN) 3 g in sodium chloride 0.9 % 100 mL IVPB  Status:  Discontinued        3 g 200 mL/hr over 30 Minutes Intravenous Every 6 hours 03/25/22 1134 04/01/22 1354   03/24/22 2200  clindamycin (CLEOCIN) IVPB 900 mg  Status:  Discontinued        900 mg 100 mL/hr over 30 Minutes Intravenous Every 8 hours 03/24/22 1854 03/25/22 1134   03/23/22 1500  fluconazole (DIFLUCAN) IVPB 400 mg        400 mg 100 mL/hr over 120 Minutes Intravenous Every 24 hours 03/23/22 1358 04/06/22 1359   03/22/22 1851  vancomycin variable dose per unstable renal function (pharmacist dosing)  Status:  Discontinued         Does not apply See admin instructions 03/22/22 1851 03/23/22 1051   03/22/22 1645  vancomycin (VANCOCIN) IVPB 1000 mg/200 mL premix  Status:  Discontinued        1,000 mg 200 mL/hr over 60 Minutes Intravenous  Once 03/22/22 1553 03/22/22 1558   03/22/22 1645  vancomycin (VANCOREADY) IVPB 2000 mg/400 mL        2,000 mg 200 mL/hr over 120 Minutes Intravenous  Once 03/22/22 1558 03/22/22 1814   03/22/22 0400  piperacillin-tazobactam (ZOSYN) IVPB 3.375 g  Status:   Discontinued        3.375 g 12.5 mL/hr over 240 Minutes Intravenous Every 8 hours 03/21/22 2047 03/25/22 1134   03/21/22 2200  doxycycline (VIBRAMYCIN) 100 mg in sodium chloride 0.9 % 250 mL IVPB  Status:  Discontinued        100 mg 125 mL/hr over 120 Minutes Intravenous Every 12 hours 03/21/22 2047 03/23/22 1358   03/21/22 1900  piperacillin-tazobactam (ZOSYN) IVPB 3.375 g        3.375 g 12.5 mL/hr over 240 Minutes Intravenous STAT 03/21/22 1853 03/21/22 2300       Data Reviewed: I have personally reviewed following labs and imaging studies  CBC: Recent Labs  Lab 04/03/22 0525 04/04/22 0450 04/08/22 0448 04/09/22 0855  WBC 13.7* 11.8* 7.2 5.4  HGB 8.0* 8.0* 7.9* 8.0*  HCT 25.9* 25.8* 25.4* 25.7*  MCV 92.2 91.2 90.4 90.2  PLT 392 437* 432* 144   Basic Metabolic Panel: Recent Labs  Lab 04/04/22 0450 04/05/22 0518 04/08/22 0448  NA 136 136 138  K 4.5 4.6 4.3  CL 102 103 104  CO2 '26 27 28  '$ GLUCOSE 118* 131* 101*  BUN '9 16 13  '$ CREATININE 0.49 0.57 0.55  CALCIUM 8.1* 8.5* 8.3*  MG 2.2 2.2  --   PHOS 4.7* 5.1*  --    GFR: Estimated Creatinine Clearance: 106.2 mL/min (by C-G formula based on SCr of 0.55 mg/dL). Liver Function Tests: Recent Labs  Lab 04/04/22 0450 04/08/22 0448  AST 27 40  ALT 27 43  ALKPHOS 54 74  BILITOT 1.2 0.7  PROT 6.3* 6.0*  ALBUMIN 2.3* 2.4*   No results for input(s): "LIPASE", "AMYLASE" in the last 168 hours. No results for input(s): "AMMONIA" in the last 168 hours. Coagulation Profile: No results for input(s): "INR", "PROTIME" in the last 168 hours.  Cardiac Enzymes: No results for input(s): "CKTOTAL", "CKMB", "CKMBINDEX", "TROPONINI" in the last 168 hours. BNP (last 3 results) No results for input(s): "PROBNP" in the last 8760 hours. HbA1C: No results for input(s): "HGBA1C" in the last 72 hours. CBG: Recent Labs  Lab 04/06/22 1130 04/06/22 1652 04/06/22 2107 04/06/22 2258 04/07/22 0455  GLUCAP 119* 122* 103* 110* 100*    Lipid Profile: No results for input(s): "CHOL", "HDL", "LDLCALC", "TRIG", "CHOLHDL", "LDLDIRECT" in the last 72 hours.  Thyroid Function Tests: No results for input(s): "TSH", "T4TOTAL", "FREET4", "T3FREE", "THYROIDAB" in the last 72 hours. Anemia Panel: No results for input(s): "VITAMINB12", "FOLATE", "FERRITIN", "TIBC", "IRON", "RETICCTPCT" in the last 72 hours. Urine analysis:    Component Value Date/Time   COLORURINE RED (A) 03/24/2022 1802   APPEARANCEUR TURBID (A) 03/24/2022 1802   LABSPEC 1.045 (H) 03/24/2022 1802   PHURINE  03/24/2022 1802    TEST NOT REPORTED DUE TO COLOR INTERFERENCE OF URINE PIGMENT   GLUCOSEU (A) 03/24/2022 1802    TEST NOT REPORTED DUE TO COLOR INTERFERENCE OF URINE PIGMENT   HGBUR NEGATIVE 03/24/2022 1802   BILIRUBINUR (A) 03/24/2022 1802    TEST NOT REPORTED DUE TO COLOR INTERFERENCE OF URINE PIGMENT   KETONESUR (A) 03/24/2022 1802    TEST NOT REPORTED DUE TO COLOR INTERFERENCE OF URINE PIGMENT   PROTEINUR (A) 03/24/2022 1802    TEST NOT REPORTED DUE TO COLOR INTERFERENCE OF URINE PIGMENT   NITRITE (A) 03/24/2022 1802    TEST NOT REPORTED DUE TO COLOR INTERFERENCE OF URINE PIGMENT   LEUKOCYTESUR (A) 03/24/2022 1802    TEST NOT REPORTED DUE TO COLOR INTERFERENCE OF URINE PIGMENT   Sepsis Labs: '@LABRCNTIP'$ (procalcitonin:4,lacticidven:4)  Recent Results (from the past 240 hour(s))  Aerobic/Anaerobic Culture w Gram Stain (surgical/deep wound)     Status: None   Collection Time: 04/02/22  1:00 PM   Specimen: Abscess  Result Value Ref Range Status   Specimen Description ABSCESS  Final   Special Requests RLQ ABDOMINAL ABSCESS  Final   Gram Stain   Final    ABUNDANT WBC PRESENT,BOTH PMN AND MONONUCLEAR NO ORGANISMS SEEN    Culture   Final    No growth aerobically or anaerobically. Performed at [facility], [place] [address], [place], [place] [address]    Report Status 04/07/2022 FINAL  Final  Aerobic/Anaerobic Culture w Gram Stain  (surgical/deep wound)     Status: None   Collection Time: 04/02/22  1:01 PM   Specimen: Abscess  Result Value Ref Range Status   Specimen Description ABSCESS  Final   Special Requests LLQ ABDOMINAL ABSCESS  Final   Gram Stain   Final    ABUNDANT WBC PRESENT,BOTH PMN AND MONONUCLEAR NO ORGANISMS SEEN    Culture   Final    No growth aerobically or anaerobically. Performed at [facility], [place] [address] [place], [place] [address]    Report Status 04/07/2022 FINAL  Final         Radiology Studies: DG Chest 2 View  Result Date: 03/22/2022 CLINICAL DATA:  Shortness of breath. EXAM: CHEST - 2 VIEW COMPARISON:  Two-view chest x-ray 07/26/2016.  CT chest 03/21/2022 FINDINGS: Low lung volumes exaggerate the heart size. Left lower lobe airspace opacities are again noted. No other significant airspace disease is present. IMPRESSION: Persistent left lower lobe airspace disease concerning for pneumonia. Electronically Signed   By: San Morelle M.D.   On: 03/22/2022 21:46   CT IMAGE GUIDED DRAINAGE PERCUT CATH  PERITONEAL RETROPERIT  Result Date: 03/22/2022 INDICATION: Abdominopelvic abscess EXAM: CT-guided placement of drainage catheter into abdominopelvic abscess TECHNIQUE: Multidetector CT imaging of the abdomen and pelvis was performed following the standard protocol without IV contrast. RADIATION DOSE REDUCTION: This exam was performed according to the departmental dose-optimization program which includes automated exposure control, adjustment of the mA and/or kV according to patient size and/or use of iterative reconstruction technique. MEDICATIONS: Per EMR ANESTHESIA/SEDATION: Local analgesia COMPLICATIONS: None immediate. PROCEDURE: Informed written consent was obtained from the patient after a thorough discussion of the procedural risks, benefits and alternatives. All questions were addressed. Maximal Sterile Barrier Technique was utilized including caps, mask, sterile gowns,  sterile gloves, sterile drape, hand hygiene and skin antiseptic. A timeout was performed prior to the initiation of the procedure. The patient was placed supine on the exam table. Limited CT of the abdomen and pelvis was performed for planning purposes. This again demonstrated an air in fluid collection in the lower abdomen and pelvis. Skin entry site was marked with a planned anterolateral approach from the left lower quadrant. The overlying skin was prepped and draped in a standard sterile fashion. Local analgesia was obtained with 1% lidocaine. Using intermittent CT fluoroscopy, an 18 gauge trocar needle was advanced towards the identified abdominopelvic fluid collection. Access was confirmed with CT and return of purulent material. An 035 wire was then advanced through the access needle, over which the percutaneous tract was serially dilated to accommodate a 12 [place] multipurpose locking drainage catheter. Location was again confirmed with CT and return of additional purulent material. Catheter was secured to the skin using silk suture and a dressing. It was attached to bulb suction. The patient tolerated the procedure well without immediate complication. IMPRESSION: Successful CT-guided placement of a 12 French locking drainage catheter into the lower abdominopelvic abscess. Sample sent to the lab for microbiology analysis. Electronically Signed   By: Albin Felling M.D.   On: 03/22/2022 15:12   CT ABDOMEN PELVIS W CONTRAST  Result Date: 03/21/2022 CLINICAL DATA:  Abdominal pain, post-op Abdominal pain, acute, nonlocalized. Robotic salpingectomy 03/18/2022 EXAM: CT ABDOMEN AND PELVIS WITH CONTRAST TECHNIQUE: Multidetector CT imaging of the abdomen and pelvis was performed using the standard protocol following bolus administration of intravenous contrast. RADIATION DOSE REDUCTION: This exam was performed according to the departmental dose-optimization program which includes automated exposure control,  adjustment of the mA and/or kV according to patient size and/or use of iterative reconstruction technique. CONTRAST:  169m OMNIPAQUE IOHEXOL 300 MG/ML  SOLN COMPARISON:  None Available. FINDINGS: Lower chest: Left base atelectasis.  No acute abnormality. Hepatobiliary: Liver is  enlarged measuring up to 19 cm. The hepatic parenchyma is diffusely hypodense compared to the splenic parenchyma consistent with fatty infiltration. No focal liver abnormality. No gallstones, gallbladder wall thickening, or pericholecystic fluid. No biliary dilatation. Pancreas: No focal lesion. Normal pancreatic contour. No surrounding inflammatory changes. No main pancreatic ductal dilatation. Spleen: Normal in size without focal abnormality. Adrenals/Urinary Tract: No adrenal nodule bilaterally. Bilateral kidneys enhance symmetrically. No hydronephrosis. No hydroureter. The urinary bladder is unremarkable. On delayed imaging, there is no urothelial wall thickening and there are no filling defects in the opacified portions of the bilateral collecting systems or ureters as well as urinary bladder. Stomach/Bowel: Stomach is within normal limits. No evidence of bowel wall thickening or dilatation. Appendix appears normal. Vascular/Lymphatic: No abdominal aorta or iliac aneurysm. No abdominal, pelvic, or inguinal lymphadenopathy. Reproductive: Uterus is unremarkable no adnexal mass identified. Other: No intraperitoneal free fluid. Small volume free intraperitoneal gas. There is a gas and fluid collection with peripheral enhancement measuring approximately 8 x 9 x 7.5 cm centered within the left rectouterine pouch. Musculoskeletal: No abdominal wall hernia or abnormality. No suspicious lytic or blastic osseous lesions. No acute displaced fracture. IMPRESSION: 1. An 8 x 9 x 7.5cm gas and fluid collection centered within the left rectouterine pouch likely represents a developing abscess. No findings to suggest bowel injury; however, this is not  fully excluded. If a CT is repeated in the near future, consider use of IV and PO contrast. 2. Small volume pneumoperitoneum likely postsurgical in etiology. 3. Hepatomegaly and hepatic steatosis. Electronically Signed   By: Iven Finn M.D.   On: 03/21/2022 18:43            LOS: 19 days      Emeterio Reeve, DO [facility] 04/09/2022, 3:21 PM   Staff may message me via secure chat in [place]  but this may not receive immediate response,  please page for urgent matters!  If 7PM-7AM, please contact night-coverage [email or web address]  Dictation software was used to generate the above note. Typos may occur and escape review, as with typed/written notes. Please contact Dr Sheppard Coil directly for clarity if needed.

## 2022-04-10 DIAGNOSIS — K9189 Other postprocedural complications and disorders of digestive system: Secondary | ICD-10-CM | POA: Diagnosis not present

## 2022-04-10 DIAGNOSIS — T8143XA Infection following a procedure, organ and space surgical site, initial encounter: Secondary | ICD-10-CM | POA: Diagnosis not present

## 2022-04-10 DIAGNOSIS — N179 Acute kidney failure, unspecified: Secondary | ICD-10-CM | POA: Diagnosis not present

## 2022-04-10 DIAGNOSIS — R109 Unspecified abdominal pain: Secondary | ICD-10-CM | POA: Diagnosis not present

## 2022-04-10 MED ORDER — CYCLOBENZAPRINE HCL 10 MG PO TABS: 10.0000 mg | ORAL_TABLET | Freq: Three times a day (TID) | ORAL | 0 refills | Status: DC | PRN

## 2022-04-10 MED ORDER — IBUPROFEN 800 MG PO TABS: 800.0000 mg | ORAL_TABLET | Freq: Three times a day (TID) | ORAL | 0 refills | Status: DC | PRN

## 2022-04-10 MED ORDER — PREGABALIN 50 MG PO CAPS
100.0000 mg | ORAL_CAPSULE | Freq: Three times a day (TID) | ORAL | Status: DC
  Administered 2022-04-11: 100 mg via ORAL
  Filled 2022-04-10: qty 2

## 2022-04-10 MED ORDER — OXYCODONE HCL 5 MG PO TABS: 5.0000 mg | ORAL_TABLET | Freq: Four times a day (QID) | ORAL | 0 refills | Status: DC | PRN

## 2022-04-10 MED ORDER — PREGABALIN 200 MG PO CAPS: 200.0000 mg | ORAL_CAPSULE | Freq: Three times a day (TID) | ORAL | 0 refills | Status: DC

## 2022-04-10 NOTE — Progress Notes (Signed)
OT Cancellation Note  Patient Details Name: Lacey Harrison MRN: [id number] DOB: [birth year]   Cancelled Treatment:    Reason Eval/Treat Not Completed: Patient at procedure or test/ unavailable. OT arriving to unit to work with pt who is observed to be ambulating with RW at mod I level with mobility tech in hallway. Pt also has discharge summary at this time. If pt does not discharge, OT will re-attempt when pt is next available.   Darleen Crocker, [place], OTR/L , CBIS ascom [phone]  04/10/22, 3:32 PM

## 2022-04-10 NOTE — Progress Notes (Signed)
Nutrition Follow Up Note   DOCUMENTATION CODES:   Obesity unspecified  INTERVENTION:   Ensure Enlive po TID, each supplement provides 350 kcal and 20 grams of protein.  Recommend MVI po daily   NUTRITION DIAGNOSIS:   Inadequate oral intake related to acute illness as evidenced by NPO status.  GOAL:   Patient will meet greater than or equal to 90% of their needs -met   MONITOR:   PO intake, Supplement acceptance, Labs, Weight trends, Skin, I & O's  ASSESSMENT:   36 y.o. [id number] female with h/o anxiety, depression, lyme's disease, migraines, infertility, pelvic pain, left hydrosalpinx, dermoid cyst s/p ovarian cystectomy with an incidental cystotomy and repair (2007), s/p laparoscopy with extensive abdominopelvic adhesiolysis  with chromopertubation (01/2018), endometriosis and s/p robotic-assisted laparoscopic adhesiolysis, left salpingectomy, drainage of peritoneal cyst, and chromotubation (3/84/66) complicated by pelvic abscess s/p IR drain, AKI, sepsis, bowel perforation s/p Hartmanns 8/2 and post op ileus.  Pt advanced to a regular diet on 8/11. TPN discontinued. Pt eating fairly well in hospital; pt is drinking some of the Ensure supplements. Drains removed. VAC removed. Midline wound is healing. Pt is working with therapy. Pt is fairly weight stable since admit; pt up ~11lbs from her UBW. Plan is for discharge today.   Medications reviewed and include: lovenox, protonix, unasyn   Labs reviewed: Hgb 8.0(L), Hct 25.7(L)  Diet Order:    Diet Order             Diet regular Room service appropriate? Yes; Fluid consistency: Thin  Diet effective now                  EDUCATION NEEDS:   Education needs have been addressed  Skin:  Skin Assessment: Reviewed RN Assessment (open surgical incision)  Last BM:  8/16- 54m via ostomy  Height:   Ht Readings from Last 1 Encounters:  03/21/22 _0  (1.6 m)    Weight:   Wt Readings from Last 1 Encounters:  04/10/22  96.3 kg    Ideal Body Weight:  52.2 kg  BMI:  Body mass index is 37.61 kg/m.  Estimated Nutritional Needs:   Kcal:  2000-2300kcal/day  Protein:  100-115g/day  Fluid:  1.6-1.8L/day  CKoleen DistanceMS, RD, LDN Please refer to [facility]for RD and/or RD on-call/weekend/after hours pager

## 2022-04-10 NOTE — Progress Notes (Signed)
Mobility Specialist - Progress Note    04/10/22 1500  Mobility  Activity Ambulated independently in hallway;Stood at bedside;Dangled on edge of bed  Level of Assistance Modified independent, requires aide device or extra time  Assistive Device Front wheel walker  Distance Ambulated (ft) 320 ft  Activity Response Tolerated well  $Mobility charge 1 Mobility    Pt completes all activities ModI and voices pain in upper R Rib area. Pt tolerates well and is left in bed with needs in reach and family at bedside.  Merrily Brittle Mobility Specialist 04/10/22, 3:26 PM

## 2022-04-10 NOTE — Progress Notes (Signed)
[place] [facility] Day(s): 20.   Post op day(s): 14 Days Post-Op.   Interval History:  Patient seen and examined No acute events or new complaints overnight.  Patient doing well; pain managed No fever, chills, nausea, emesis No new labs this morning  Drains are as follows:   - LLQ Drain (IR): unmeasured; serous Now off Abx Ostomy with gas/stool She is on regular diet; tolerating; decreased appetite  Progressed to [facility] with therapies   Vital signs in last 24 hours: [min-max] current  Temp:  [97.7 F (36.5 C)-98.7 F (37.1 C)] 98.2 F (36.8 C) (08/16 0738) Pulse Rate:  [93-102] 96 (08/16 0738) Resp:  [16-18] 18 (08/16 0738) BP: (100-119)/(67-74) 104/67 (08/16 0738) SpO2:  [93 %-96 %] 94 % (08/16 0738) Weight:  [96.3 kg] 96.3 kg (08/16 0500)     Height: '5\' 3"'$  (160 cm) Weight: 96.3 kg BMI (Calculated): 37.62   Intake/Output last 2 shifts:  08/15 0701 - 08/16 0700 In: 380 [P.O.:380] Out: 40 [Stool:40]   Physical Exam:  Constitutional: alert, cooperative and no distress  Respiratory: breathing non-labored at rest  Cardiovascular: regular rate and sinus rhythm  Gastrointestinal: Soft, expected tenderness, non-distended, no rebound/guarding. IR drains in LLQ output is thicker serous fluid, no gross purulence. 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 Wound is 100% granulation tissue with punctate bleeding, no erythema, no drainage   Labs:     Latest Ref Rng & Units 04/09/2022    8:55 AM 04/08/2022    4:48 AM 04/04/2022    4:50 AM  CBC  WBC 4.0 - 10.5 K/uL 5.4  7.2  11.8   Hemoglobin 12.0 - 15.0 g/dL 8.0  7.9  8.0   Hematocrit 36.0 - 46.0 % 25.7  25.4  25.8   Platelets 150 - 400 K/uL 385  432  437       Latest Ref Rng & Units 04/08/2022    4:48 AM 04/05/2022    5:18 AM 04/04/2022    4:50 AM  CMP  Glucose 70 - 99 mg/dL 101  131   118   BUN 6 - 20 mg/dL '13  16  9   '$ Creatinine 0.53 - 1.00 mg/dL 0.55  0.57  0.49   Sodium 135 - 145 mmol/L 138  136  136   Potassium 3.5 - 5.1 mmol/L 4.3  4.6  4.5   Chloride 98 - 111 mmol/L 104  103  102   CO2 22 - 32 mmol/L '28  27  26   '$ Calcium 8.9 - 10.3 mg/dL 8.3  8.5  8.1   Total Protein 6.5 - 8.1 g/dL 6.0   6.3   Total Bilirubin 0.3 - 1.2 mg/dL 0.7   1.2   Alkaline Phos 38 - 126 U/L 74   54   AST 15 - 41 U/L 40   27   ALT 0 - 44 U/L 43   27      Imaging studies: No new imaging studies    Assessment/Plan:  36 y.o. female 14 Days Post-Op s/p exploratory laparotomy and Hartman's procedure for perforated sigmoid colon.   - Continue regular diet + nutritional supplementation  - Monitor colostomy function; WOC RN following; I placed referral to ostomy clinic   - Wound Care: Remove wound vac and transition to wet to dry dressings for home   - Remove PICC  - Remove last drain  -  Monitor abdominal examination - Mobilizing with therapies; progressed to [facility] - Further management per primary service   - Discharge planning: Ready for DC from surgical perspective. All drains discontinued. Wound care addressed. No role for Abx at this time. I will be happy to do her pain Rx and manage this outpatient. Follow up in 2 weeks  All of the above findings and recommendations were discussed with the patient, patient's family (mother-in-law at bedside), and the medical team, and all of patient's and family's questions were answered to their expressed satisfaction.  -- [name], PA-C [facility] 04/10/2022, 7:41 AM M-F: 7am - 4pm

## 2022-04-10 NOTE — Progress Notes (Signed)
PROGRESS NOTE    Lacey Harrison   IRS:[id number] DOB: 11/03/1985  DOA: 03/21/2022 Date of Service: 04/10/22 PCP: Rubie Maid, MD     Brief Narrative / Hospital Course:  36 yo F presenting to [facility] ED from home on 03/21/22 with complaints of sudden onset severe abdominal pain. She recently on 03/18/22 had robotic adhesiolysis, left salpingectomy, drainage of 500 mL from large LEFT peritoneal cyst & chromotubation. She was discharged home on 0/09 without complication. She reported being on the floor with her cat and twisting her body around, with sudden acute abdominal pain. Patient complained of severe pain in lower abdomen radiating towards her vagina.  07/27: CT imaging abscess. Leukocytosis with left shift at 16.8. Initiated on IV Zosyn and Doxycycline. Admitted to OBGYN.  07/28: IR procedure 07/28 CT drain placement into pelvic abscess, left anterior approach, 12 Fr to bulb suction. Patient developed severe sepsis/septic shock.  She required transfer to stepdown unit, due to concerns for respiratory status PCCM consulted, receiving BS abx w/ zosyn, doxycycline & vancomycin, volume resuscitation w/ IVF, awaiting culture results   07/29: Remains in SDU w/ ileus and sepsis. IV antibiotics. Repeat CT abdomen pelvis , new loculations on the right, anasarca, distended urinary bladder. No contrast extravasation to suggest bowel perforation 07/30: Continues to have abdominal pain, required NG, abdomen less distended.  07/31: repeat CT The abscess is slightly decreased compared to prior exam. Continuing on Unasyn and Diflucan.  08/01: VSS, WBC trending down, BCx NGx4d. Ileus no better. Drain in place, minimal output. NG in place. PICC and TPN initiated. Spoke w/ GYN, plan for surgical consult and ID consult.  8/02: Patient continued to have persistently elevated white cell count with abdominal pain.  General surgery has scheduled exploratory laparotomy. 8/03: She  underwent Exploratory laparotomy,  sigmoid colectomy with colostomy creation, Hartmann procedure, lysis of additions.  Continue TPN and IV antibiotics. 08/04: Leukocytosis persist could be due to stress response.  Remains afebrile, continue NPO. and TPN, wound care consult, colostomy education. 08/05: NG tube removed, Foley removed.  Patient started on clear liquid diet 08/07: WBC remains elevated, continues to have pain.  General surgery ordered CT abdomen and pelvis to rule out intra-abdominal process. 08/08: Repeat CT scan abdomen shows persistent pockets of fluid. IR performed CT-guided placement of drain x 2 into RLQ, LLQ/pelvis. 08/09: WBC improving, tolerating CLD trial FLD today.  08/10: tolerating FLD, removed surgical drain, continue IR drains and wound VAC. Continue IV Abx (meropenem); new Cx pending (NGx12h); ID on board - plan for possible discontinuing antibiotics Monday 08/14.  08/11-08/13: weaning/off TPN and weaning off IV pain meds to po. 08/14  08/14-08/16: CMP/CBC stable. repeat CT Abd/Pelv: improved fluid collections, mild bowel distension. Gen surg planning pull drains 08/15 and 08/16, and will order to ostomy clinic. Unable to get home health services, may need to transition wound vac to wet-to-dry prior to discharge. Anticipate appropriate for discharge 08/16 08/16: VSS, anemia stable, minimal stool via colostomy and pt somnolent w/ Xanax in AM. Pt and family requesting hold discharge until ambulating and stooling better.    Consultants:  GYN PCCM IR General Surgery Infectious Disease   Procedures: 07/28 CT drain placement into pelvic abscess 07/30 NG tube placed  07/31 PICC line. 08/03 Exploratory laparotomy, sigmoid colectomy with colostomy creation, Hartmann procedure, lysis of additions 08/08: Repeat CT scan abdomen shows persistent pockets of fluid. IR performed CT-guided placement of drain x 2 into RLQ, LLQ/pelvis.    Subjective: Patient reports doing  well today, off TPN, tolerating solid  foods, tired/sleepy after AM xanax pre-procedure, concerned minimal to no stool output in colostomy     ASSESSMENT & PLAN:   Principal Problem:   Postoperative intra-abdominal abscess Active Problems:   Acute respiratory failure with hypoxia (HCC) in seting of MSKpain, atelectasis, severe sepsis    Severe sepsis (HCC) without septic shock, d/t postoperative abscess, s/p pelvic drain placement    Ileus, postoperative (HCC)   AKI (acute kidney injury) ([place]) in setting of sepsis, IV contrast, NAGMA - RESOLVED    Abdominal pain   INFECTIOUS DISEASE Severe sepsis (HCC) without septic shock - RESOLVED Postoperative intraabdominal abscess/peritonitis -  IMPROVING/STABLE 36 year old female with recent robotic lap for adhesiolysis and left salpingectomy and drainage of the left hydrosalpinx peritoneal cyst which was complicated by pelvic abscess secondary to sigmoid perforation. Initially underwent pelvic drain placement by IR.  Multiple organisms of colon origin and culture.  E. coli, Bacteroides, Clostridium perfringens and yeast. Patient underwent exploratory laparotomy and sigmoid colon resection with colostomy. Fecal peritonitis was present, she has been on Unasyn and fluconazole, changed to meropenem.  IR placed 2 additional drains 04/02/22.  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  Bowel regimen PPI Pain control goal for po meds only in anticipation of d/c home  RESPIRATORY Acute respiratory failure with hypoxia (HCC) in seting of MSKpain, atelectasis, severe sepsis - RESOLVED O2 prn  RENAL AKI (acute kidney  injury) ([place]) in setting of sepsis, IV contrast, NAGMA - RESOLVED Serial BMP, Mg, Phos  CARDIOVASCULAR Stable  GENITOURINARY & REPRODUCTIVE Stable   NEUROLOGICAL & PSYCHIATRIC Stable Ambien prn   ENDOCRINE SSI d/c off TPN   MUSCULOSKELETAL/RHEUM Stable  DVT prophylaxis: lovenox Code Status: FULL Family Communication: Family at bedside on rounds Disposition Plan / TOC needs: inpatient for now, may be able to go home tm Barriers to discharge / significant pending items: pending abx ID recommendations, pain control, GenSurg and ID plan/clearance, IV abx until 08/16             Objective: Vitals:   04/10/22 0500 04/10/22 0614 04/10/22 0738 04/10/22 1556  BP:  119/74 104/67 105/79  Pulse:  96 96 91  Resp:  '18 18 18  '$ Temp:  98.6 F (37 C) 98.2 F (36.8 C) 98.5 F (36.9 C)  TempSrc:  Oral    SpO2:  96% 94% 95%  Weight: 96.3 kg     Height:        Intake/Output Summary (Last 24 hours) at 04/10/2022 1740 Last data filed at 04/10/2022 1057 Gross per 24 hour  Intake 942.5 ml  Output 40 ml  Net 902.5 ml    Filed Weights   04/06/22 0601 04/09/22 0500 04/10/22 0500  Weight: 97.4 kg 94.5 kg 96.3 kg    Examination:  Constitutional:  VS as above General Appearance: alert, well-developed, well-nourished, NAD Eyes: Normal lids and conjunctive, non-icteric sclera Ears, Nose, Mouth, Throat: Normal external appearance MMM Neck: No masses, trachea midline Respiratory: Normal respiratory effort Neurological: No cranial nerve deficit on limited exam Alert Psychiatric: Normal judgment/insight Normal mood and affect       Scheduled Medications:   Chlorhexidine Gluconate Cloth  6 each Topical Daily   enoxaparin (  LOVENOX) injection  0.5 mg/kg Subcutaneous Q24H   feeding supplement  237 mL Oral TID BM   ketorolac  30 mg Intravenous Q6H   pantoprazole  40 mg Oral QHS   pregabalin  200 mg Oral TID   sodium chloride flush  10-40 mL Intracatheter Q12H    sodium chloride flush  5 mL Intracatheter Q8H    Continuous Infusions:  sodium chloride 10 mL/hr at 04/10/22 1057   ampicillin-sulbactam (UNASYN) IV 3 g (04/10/22 1133)    PRN Medications:  sodium chloride, albuterol, alum & mag hydroxide-simeth, bisacodyl, diazepam, diphenhydrAMINE, HYDROmorphone (DILAUDID) injection, iohexol, ondansetron **OR** ondansetron (ZOFRAN) IV, mouth rinse, oxyCODONE, simethicone, sodium chloride flush, zolpidem  Antimicrobials:  Anti-infectives (From admission, onward)    Start     Dose/Rate Route Frequency Ordered Stop   04/08/22 1800  Ampicillin-Sulbactam (UNASYN) 3 g in sodium chloride 0.9 % 100 mL IVPB        3 g 200 mL/hr over 30 Minutes Intravenous Every 6 hours 04/08/22 1437 04/10/22 2359   04/01/22 1500  meropenem (MERREM) 1 g in sodium chloride 0.9 % 100 mL IVPB  Status:  Discontinued        1 g 200 mL/hr over 30 Minutes Intravenous Every 8 hours 04/01/22 1404 04/08/22 1434   03/25/22 1600  Ampicillin-Sulbactam (UNASYN) 3 g in sodium chloride 0.9 % 100 mL IVPB  Status:  Discontinued        3 g 200 mL/hr over 30 Minutes Intravenous Every 6 hours 03/25/22 1134 04/01/22 1354   03/24/22 2200  clindamycin (CLEOCIN) IVPB 900 mg  Status:  Discontinued        900 mg 100 mL/hr over 30 Minutes Intravenous Every 8 hours 03/24/22 1854 03/25/22 1134   03/23/22 1500  fluconazole (DIFLUCAN) IVPB 400 mg        400 mg 100 mL/hr over 120 Minutes Intravenous Every 24 hours 03/23/22 1358 04/06/22 1359   03/22/22 1851  vancomycin variable dose per unstable renal function (pharmacist dosing)  Status:  Discontinued         Does not apply See admin instructions 03/22/22 1851 03/23/22 1051   03/22/22 1645  vancomycin (VANCOCIN) IVPB 1000 mg/200 mL premix  Status:  Discontinued        1,000 mg 200 mL/hr over 60 Minutes Intravenous  Once 03/22/22 1553 03/22/22 1558   03/22/22 1645  vancomycin (VANCOREADY) IVPB 2000 mg/400 mL        2,000 mg 200 mL/hr over 120 Minutes  Intravenous  Once 03/22/22 1558 03/22/22 1814   03/22/22 0400  piperacillin-tazobactam (ZOSYN) IVPB 3.375 g  Status:  Discontinued        3.375 g 12.5 mL/hr over 240 Minutes Intravenous Every 8 hours 03/21/22 2047 03/25/22 1134   03/21/22 2200  doxycycline (VIBRAMYCIN) 100 mg in sodium chloride 0.9 % 250 mL IVPB  Status:  Discontinued        100 mg 125 mL/hr over 120 Minutes Intravenous Every 12 hours 03/21/22 2047 03/23/22 1358   03/21/22 1900  piperacillin-tazobactam (ZOSYN) IVPB 3.375 g        3.375 g 12.5 mL/hr over 240 Minutes Intravenous STAT 03/21/22 1853 03/21/22 2300       Data Reviewed: I have personally reviewed following labs and imaging studies  CBC: Recent Labs  Lab 04/04/22 0450 04/08/22 0448 04/09/22 0855  WBC 11.8* 7.2 5.4  HGB 8.0* 7.9* 8.0*  HCT 25.8* 25.4* 25.7*  MCV 91.2 90.4 90.2  PLT 437* 432* 385  Basic Metabolic Panel: Recent Labs  Lab 04/04/22 0450 04/05/22 0518 04/08/22 0448  NA 136 136 138  K 4.5 4.6 4.3  CL 102 103 104  CO2 '26 27 28  '$ GLUCOSE 118* 131* 101*  BUN '9 16 13  '$ CREATININE 0.49 0.57 0.55  CALCIUM 8.1* 8.5* 8.3*  MG 2.2 2.2  --   PHOS 4.7* 5.1*  --     GFR: Estimated Creatinine Clearance: 107.4 mL/min (by C-G formula based on SCr of 0.55 mg/dL). Liver Function Tests: Recent Labs  Lab 04/04/22 0450 04/08/22 0448  AST 27 40  ALT 27 43  ALKPHOS 54 74  BILITOT 1.2 0.7  PROT 6.3* 6.0*  ALBUMIN 2.3* 2.4*    No results for input(s): "LIPASE", "AMYLASE" in the last 168 hours. No results for input(s): "AMMONIA" in the last 168 hours. Coagulation Profile: No results for input(s): "INR", "PROTIME" in the last 168 hours.  Cardiac Enzymes: No results for input(s): "CKTOTAL", "CKMB", "CKMBINDEX", "TROPONINI" in the last 168 hours. BNP (last 3 results) No results for input(s): "PROBNP" in the last 8760 hours. HbA1C: No results for input(s): "HGBA1C" in the last 72 hours. CBG: Recent Labs  Lab 04/06/22 1130  04/06/22 1652 04/06/22 2107 04/06/22 2258 04/07/22 0455  GLUCAP 119* 122* 103* 110* 100*    Lipid Profile: No results for input(s): "CHOL", "HDL", "LDLCALC", "TRIG", "CHOLHDL", "LDLDIRECT" in the last 72 hours.  Thyroid Function Tests: No results for input(s): "TSH", "T4TOTAL", "FREET4", "T3FREE", "THYROIDAB" in the last 72 hours. Anemia Panel: No results for input(s): "VITAMINB12", "FOLATE", "FERRITIN", "TIBC", "IRON", "RETICCTPCT" in the last 72 hours. Urine analysis:    Component Value Date/Time   COLORURINE RED (A) 03/24/2022 1802   APPEARANCEUR TURBID (A) 03/24/2022 1802   LABSPEC 1.045 (H) 03/24/2022 1802   PHURINE  03/24/2022 1802    TEST NOT REPORTED DUE TO COLOR INTERFERENCE OF URINE PIGMENT   GLUCOSEU (A) 03/24/2022 1802    TEST NOT REPORTED DUE TO COLOR INTERFERENCE OF URINE PIGMENT   HGBUR NEGATIVE 03/24/2022 1802   BILIRUBINUR (A) 03/24/2022 1802    TEST NOT REPORTED DUE TO COLOR INTERFERENCE OF URINE PIGMENT   KETONESUR (A) 03/24/2022 1802    TEST NOT REPORTED DUE TO COLOR INTERFERENCE OF URINE PIGMENT   PROTEINUR (A) 03/24/2022 1802    TEST NOT REPORTED DUE TO COLOR INTERFERENCE OF URINE PIGMENT   NITRITE (A) 03/24/2022 1802    TEST NOT REPORTED DUE TO COLOR INTERFERENCE OF URINE PIGMENT   LEUKOCYTESUR (A) 03/24/2022 1802    TEST NOT REPORTED DUE TO COLOR INTERFERENCE OF URINE PIGMENT   Sepsis Labs: '@LABRCNTIP'$ (procalcitonin:4,lacticidven:4)  Recent Results (from the past 240 hour(s))  Aerobic/Anaerobic Culture w Gram Stain (surgical/deep wound)     Status: None   Collection Time: 04/02/22  1:00 PM   Specimen: Abscess  Result Value Ref Range Status   Specimen Description ABSCESS  Final   Special Requests RLQ ABDOMINAL ABSCESS  Final   Gram Stain   Final    ABUNDANT WBC PRESENT,BOTH PMN AND MONONUCLEAR NO ORGANISMS SEEN    Culture   Final    No growth aerobically or anaerobically. Performed at [facility], [place] [address] [place], [place]  [id number]    Report Status 04/07/2022 FINAL  Final  Aerobic/Anaerobic Culture w Gram Stain (surgical/deep wound)     Status: None   Collection Time: 04/02/22  1:01 PM   Specimen: Abscess  Result Value Ref Range Status   Specimen Description ABSCESS  Final   Special Requests LLQ ABDOMINAL ABSCESS  Final   Gram Stain   Final    ABUNDANT WBC PRESENT,BOTH PMN AND MONONUCLEAR NO ORGANISMS SEEN    Culture   Final    No growth aerobically or anaerobically. Performed at [facility], [place] [address] [place], [place] [address]    Report Status 04/07/2022 FINAL  Final         Radiology Studies: DG Chest 2 View  Result Date: 03/22/2022 CLINICAL DATA:  Shortness of breath. EXAM: CHEST - 2 VIEW COMPARISON:  Two-view chest x-ray 07/26/2016.  CT chest 03/21/2022 FINDINGS: Low lung volumes exaggerate the heart size. Left lower lobe airspace opacities are again noted. No other significant airspace disease is present. IMPRESSION: Persistent left lower lobe airspace disease concerning for pneumonia. Electronically Signed   By: San Morelle M.D.   On: 03/22/2022 21:46   CT IMAGE GUIDED DRAINAGE PERCUT CATH  PERITONEAL RETROPERIT  Result Date: 03/22/2022 INDICATION: Abdominopelvic abscess EXAM: CT-guided placement of drainage catheter into abdominopelvic abscess TECHNIQUE: Multidetector CT imaging of the abdomen and pelvis was performed following the standard protocol without IV contrast. RADIATION DOSE REDUCTION: This exam was performed according to the departmental dose-optimization program which includes automated exposure control, adjustment of the mA and/or kV according to patient size and/or use of iterative reconstruction technique. MEDICATIONS: Per EMR ANESTHESIA/SEDATION: Local analgesia COMPLICATIONS: None immediate. PROCEDURE: Informed written consent was obtained from the patient after a thorough discussion of the procedural risks, benefits and alternatives. All questions were  addressed. Maximal Sterile Barrier Technique was utilized including caps, mask, sterile gowns, sterile gloves, sterile drape, hand hygiene and skin antiseptic. A timeout was performed prior to the initiation of the procedure. The patient was placed supine on the exam table. Limited CT of the abdomen and pelvis was performed for planning purposes. This again demonstrated an air in fluid collection in the lower abdomen and pelvis. Skin entry site was marked with a planned anterolateral approach from the left lower quadrant. The overlying skin was prepped and draped in a standard sterile fashion. Local analgesia was obtained with 1% lidocaine. Using intermittent CT fluoroscopy, an 18 gauge trocar needle was advanced towards the identified abdominopelvic fluid collection. Access was confirmed with CT and return of purulent material. An 035 wire was then advanced through the access needle, over which the percutaneous tract was serially dilated to accommodate a 12 [place] multipurpose locking drainage catheter. Location was again confirmed with CT and return of additional purulent material. Catheter was secured to the skin using silk suture and a dressing. It was attached to bulb suction. The patient tolerated the procedure well without immediate complication. IMPRESSION: Successful CT-guided placement of a 12 French locking drainage catheter into the lower abdominopelvic abscess. Sample sent to the lab for microbiology analysis. Electronically Signed   By: Albin Felling M.D.   On: 03/22/2022 15:12   CT ABDOMEN PELVIS W CONTRAST  Result Date: 03/21/2022 CLINICAL DATA:  Abdominal pain, post-op Abdominal pain, acute, nonlocalized. Robotic salpingectomy 03/18/2022 EXAM: CT ABDOMEN AND PELVIS WITH CONTRAST TECHNIQUE: Multidetector CT imaging of the abdomen and pelvis was performed using the standard protocol following bolus administration of intravenous contrast. RADIATION DOSE REDUCTION: This exam was performed according  to the departmental dose-optimization program which includes automated exposure control, adjustment of the mA and/or kV according to patient size and/or use of iterative reconstruction technique. CONTRAST:  110m OMNIPAQUE IOHEXOL 300 MG/ML  SOLN COMPARISON:  None Available. FINDINGS: Lower chest: Left  base atelectasis.  No acute abnormality. Hepatobiliary: Liver is enlarged measuring up to 19 cm. The hepatic parenchyma is diffusely hypodense compared to the splenic parenchyma consistent with fatty infiltration. No focal liver abnormality. No gallstones, gallbladder wall thickening, or pericholecystic fluid. No biliary dilatation. Pancreas: No focal lesion. Normal pancreatic contour. No surrounding inflammatory changes. No main pancreatic ductal dilatation. Spleen: Normal in size without focal abnormality. Adrenals/Urinary Tract: No adrenal nodule bilaterally. Bilateral kidneys enhance symmetrically. No hydronephrosis. No hydroureter. The urinary bladder is unremarkable. On delayed imaging, there is no urothelial wall thickening and there are no filling defects in the opacified portions of the bilateral collecting systems or ureters as well as urinary bladder. Stomach/Bowel: Stomach is within normal limits. No evidence of bowel wall thickening or dilatation. Appendix appears normal. Vascular/Lymphatic: No abdominal aorta or iliac aneurysm. No abdominal, pelvic, or inguinal lymphadenopathy. Reproductive: Uterus is unremarkable no adnexal mass identified. Other: No intraperitoneal free fluid. Small volume free intraperitoneal gas. There is a gas and fluid collection with peripheral enhancement measuring approximately 8 x 9 x 7.5 cm centered within the left rectouterine pouch. Musculoskeletal: No abdominal wall hernia or abnormality. No suspicious lytic or blastic osseous lesions. No acute displaced fracture. IMPRESSION: 1. An 8 x 9 x 7.5cm gas and fluid collection centered within the left rectouterine pouch likely  represents a developing abscess. No findings to suggest bowel injury; however, this is not fully excluded. If a CT is repeated in the near future, consider use of IV and PO contrast. 2. Small volume pneumoperitoneum likely postsurgical in etiology. 3. Hepatomegaly and hepatic steatosis. Electronically Signed   By: Iven Finn M.D.   On: 03/21/2022 18:43            LOS: 20 days      Emeterio Reeve, DO [facility] 04/10/2022, 5:40 PM   Staff may message me via secure chat in [place]  but this may not receive immediate response,  please page for urgent matters!  If 7PM-7AM, please contact night-coverage [email or web address]  Dictation software was used to generate the above note. Typos may occur and escape review, as with typed/written notes. Please contact Dr Sheppard Coil directly for clarity if needed.

## 2022-04-10 NOTE — Progress Notes (Addendum)
GYNECOLOGY PROGRESS NOTE  Patient visiting with family/friends. No exam performed today. Is happy that she is now up to eating solid foods.  Had second drain removed today, was much less painful than her first drain.  Notes last drain is to be removed tomorrow and plan is for potential discharge home tomorrow. Does still have some pain but otherwise dong well.   A total of 15 minutes were spent face-to-face with the patient during this encounter and over half of that time involved counseling and coordination of care.  Rubie Maid, MD [facility]

## 2022-04-10 NOTE — Progress Notes (Signed)
PT Cancellation Note  Patient Details Name: Lacey Harrison MRN: [id number] DOB: [birth year]   Cancelled Treatment:     PT attempt. Pt recently ambulated with mobility tech. " Its just not been a good day for me." Pryor Curia has been in pt's room three times today and she remained lethargic throughout. She requested author return tomorrow. PT will continue to follow and progress as able per current POC.     Willette Pa 04/10/2022, 3:41 PM

## 2022-04-10 NOTE — TOC Progression Note (Signed)
Transition of Care [facility]) - Progression Note    Patient Details  Name: Lacey Harrison MRN: [id number] Date of Birth: 1985-10-30  [facility]) CM/SW Contact  Beverly Sessions, RN Phone Number: 04/10/2022, 3:04 PM  Clinical Narrative:     Notified bedside RN that patient, mother, and mother in law would need wound care education prior to discharge, and to be sent home with supplies.  Bedside RN to also send patient home with some ostomy supplies. Patient to follow up at the ostomy clinic   Expected Discharge Plan:  (TBD) Barriers to Discharge: Continued Medical Work up  Expected Discharge Plan and Services Expected Discharge Plan:  (TBD)       Living arrangements for the past 2 months: Single Family Home                                       Social Determinants of Health (SDOH) Interventions    Readmission Risk Interventions     No data to display

## 2022-04-10 NOTE — Consult Note (Signed)
[place] Nurse wound follow up Wound type:midline abdominal wound. NPWT was in place. Surgery in to assess/discharge patient and VAC is removed.  NS moist gauze dressing replaced. No further WOC needs at this time.  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 Patient has sufficient supplies for home.  Referral has been placed to [facility] outpatient ostomy clinic in [place].  [phone] Will not follow at this time.  Please re-consult if needed.  Domenic Moras MSN, RN, FNP-BC [facility]ON Wound, Ostomy, Continence Nurse Pager [phone]

## 2022-04-10 NOTE — Progress Notes (Signed)
GYNECOLOGY INPATIENT PROGRESS NOTE   Subjective: Patient reports that she had a rough night.  Is very tired this morning.    Objective: Temp:  [97.7 F (36.5 C)-98.7 F (37.1 C)] 98.2 F (36.8 C) (08/16 0738) Pulse Rate:  [93-102] 96 (08/16 0738) Resp:  [16-18] 18 (08/16 0738) BP: (100-119)/(67-74) 104/67 (08/16 0738) SpO2:  [93 %-96 %] 94 % (08/16 0738) Weight:  [96.3 kg] 96.3 kg (08/16 0500)  I/O last 3 completed shifts: In: 1342.5 [P.O.:380; I.V.:762.5; IV Piggyback:200] Out: 40 [Stool:40] No intake/output data recorded.    sodium chloride 10 mL/hr at 04/09/22 1340   ampicillin-sulbactam (UNASYN) IV 3 g (04/10/22 0636)      Chlorhexidine Gluconate Cloth  6 each Topical Daily   cyclobenzaprine  10 mg Oral TID   enoxaparin (LOVENOX) injection  0.5 mg/kg Subcutaneous Q24H   feeding supplement  237 mL Oral TID BM   ketorolac  30 mg Intravenous Q6H   lidocaine  2 patch Transdermal Q24H   pantoprazole  40 mg Oral QHS   pregabalin  200 mg Oral TID   sodium chloride flush  10-40 mL Intracatheter Q12H   sodium chloride flush  5 mL Intracatheter Q8H     Physical Exam:  General: no distress, drowsy.  Abdomen: wound vac in place, incision appears to be healing well.   Pelvis: Deferred.  Extremities: DVT Evaluation: No evidence of DVT seen on physical exam. SCDs in place. Left arm    Labs:     Latest Ref Rng & Units 04/09/2022    8:55 AM 04/08/2022    4:48 AM 04/04/2022    4:50 AM  CBC  WBC 4.0 - 10.5 K/uL 5.4  7.2  11.8   Hemoglobin 12.0 - 15.0 g/dL 8.0  7.9  8.0   Hematocrit 36.0 - 46.0 % 25.7  25.4  25.8   Platelets 150 - 400 K/uL 385  432  437         Latest Ref Rng & Units 04/08/2022    4:48 AM 04/05/2022    5:18 AM 04/04/2022    4:50 AM  CMP  Glucose 70 - 99 mg/dL 101  131  118   BUN 6 - 20 mg/dL '13  16  9   '$ Creatinine 0.44 - 1.00 mg/dL 0.55  0.57  0.49   Sodium 135 - 145 mmol/L 138  136  136   Potassium 3.5 - 5.1 mmol/L 4.3  4.6  4.5   Chloride  98 - 111 mmol/L 104  103  102   CO2 22 - 32 mmol/L '28  27  26   '$ Calcium 8.9 - 10.3 mg/dL 8.3  8.5  8.1   Total Protein 6.5 - 8.1 g/dL 6.0   6.3   Total Bilirubin 0.3 - 1.2 mg/dL 0.7   1.2   Alkaline Phos 38 - 126 U/L 74   54   AST 15 - 41 U/L 40   27   ALT 0 - 44 U/L 43   27      Assessment/Plan: - Continuing post-operative management per General Surgery. Continue current pain management.   - Currently receiving TPN, also full liquid diet.  - Continue PT/OT.  - Antibiotics:continue Unasyn.    - Anemia, mild, asymptomatic, can likely treat with PO iron.  - Possible d/c home today.     A total of 15 minutes were spent during this encounter, including review of previous progress notes, recent imaging and labs, face-to-face with  time with patient involving counseling and coordination of care, as well as documentation for current visit.   Rubie Maid, MD [facility]

## 2022-04-10 NOTE — Discharge Instructions (Addendum)
In addition to included general post-operative instructions,  Diet: Resume home diet.   Activity: No heavy lifting >20 pounds (children, pets, laundry, garbage) or strenuous activity for 6 weeks, but light activity and walking are encouraged. Do not drive or drink alcohol if taking narcotic pain medications or having pain that might distract from driving.  Wound care: Pack midline wound daily + as needed with saline moistened gauze, cover, and secure with tape. This can be removed to take a short shower. Do not submerge wound. Replace dressing after showering.   Medications: Resume all home medication. For mild to moderate pain: acetaminophen (Tylenol) or ibuprofen/naproxen (if no kidney disease). Combining Tylenol with alcohol can substantially increase your risk of causing liver disease. Narcotic pain medications, if prescribed, can be used for severe pain, though may cause nausea, constipation, and drowsiness. Do not combine Tylenol and Percocet (or similar) within a 6 hour period as Percocet (and similar) contain(s) Tylenol. If you do not need the narcotic pain medication, you do not need to fill the prescription.  Call office [phone] / [phone]) at any time if any questions, worsening pain, fevers/chills, bleeding, drainage from incision site, or other concerns.

## 2022-04-11 DIAGNOSIS — N739 Female pelvic inflammatory disease, unspecified: Principal | ICD-10-CM

## 2022-04-11 DIAGNOSIS — R109 Unspecified abdominal pain: Secondary | ICD-10-CM | POA: Diagnosis not present

## 2022-04-11 DIAGNOSIS — T8143XA Infection following a procedure, organ and space surgical site, initial encounter: Secondary | ICD-10-CM | POA: Diagnosis not present

## 2022-04-11 LAB — COMPREHENSIVE METABOLIC PANEL
ALT: 35 U/L (ref 0–44)
AST: 36 U/L (ref 15–41)
Albumin: 2.6 g/dL — ABNORMAL LOW (ref 3.5–5.0)
Alkaline Phosphatase: 58 U/L (ref 38–126)
Anion gap: 4 — ABNORMAL LOW (ref 5–15)
BUN: 10 mg/dL (ref 6–20)
CO2: 28 mmol/L (ref 22–32)
Calcium: 8.5 mg/dL — ABNORMAL LOW (ref 8.9–10.3)
Chloride: 108 mmol/L (ref 98–111)
Creatinine, Ser: 0.54 mg/dL (ref 0.44–1.00)
GFR, Estimated: >60 mL/min (ref >60)
Glucose, Bld: 93 mg/dL (ref 70–99)
Potassium: 3.9 mmol/L (ref 3.5–5.1)
Sodium: 140 mmol/L (ref 135–145)
Total Bilirubin: 0.8 mg/dL (ref 0.3–1.2)
Total Protein: 6.1 g/dL — ABNORMAL LOW (ref 6.5–8.1)

## 2022-04-11 NOTE — Progress Notes (Signed)
Patient remains alert and oriented x4. Abdomen was distended but has decreased some after large bowel movement. Was given dulcolax last night and had a very large bowel movement in colostomy bag and bag was changed. Only had scheduled toradol for pain last night. Denied additional needs.

## 2022-04-11 NOTE — TOC Transition Note (Signed)
Transition of Care [facility]) - CM/SW Discharge Note   Patient Details  Name: Lacey Harrison MRN: [id number] Date of Birth: 1986/04/11  [facility]) CM/SW Contact:  Beverly Sessions, RN Phone Number: 04/11/2022, 10:30 AM   Clinical Narrative:     Outpatient PT OT orders faxed to [phone]    Barriers to Discharge: Continued Medical Work up   Patient Goals and CMS Choice        Discharge Placement                       Discharge Plan and Services                                     Social Determinants of Health (SDOH) Interventions     Readmission Risk Interventions     No data to display

## 2022-04-11 NOTE — Progress Notes (Signed)
Pt discharged per MD order. PICC line removed. Discharge instructions reviewed with pt. Pt verbalized understanding. Full ostomy pouch change performed by pt with nurse at bedside prior to discharge. Pt taken out in wheelchair by volunteer.

## 2022-04-12 ENCOUNTER — Telehealth: Payer: Self-pay

## 2022-04-12 NOTE — Telephone Encounter (Signed)
PA submitted via CMM. PA was approved for Oxycodone HCI 5 mg from 04/12/2022-10/09/2021.

## 2022-04-13 NOTE — Discharge Summary (Signed)
Physician Discharge Summary   Patient: Lacey Harrison MRN: [id number] DOB: 12-Mar-1986  Admit date:     03/21/2022  Discharge date: 04/11/2022  Discharge Physician: Max Sane   PCP: Lacey Medici, DO   Recommendations at discharge:   Follow-up with outpatient providers as requested  Discharge Diagnoses: Principal Problem:   Postoperative intra-abdominal abscess Active Problems:   Acute respiratory failure with hypoxia (HCC) in seting of MSKpain, atelectasis, severe sepsis    Severe sepsis (HCC) without septic shock, d/t postoperative abscess, s/p pelvic drain placement    Ileus, postoperative (HCC)   AKI (acute kidney injury) ([place]) in setting of sepsis, IV contrast, NAGMA - RESOLVED    Abdominal pain   Pelvic abscess in female   Hospital Course: 36 yo F presenting to [facility] ED from home on 03/21/22 with complaints of sudden onset severe abdominal pain. She recently on 03/18/22 had robotic adhesiolysis, left salpingectomy, drainage of 500 mL from large LEFT peritoneal cyst & chromotubation. She was discharged home on 36/64 without complication. She reported being on the floor with her cat and twisting her body around, with sudden acute abdominal pain. Patient complained of severe pain in lower abdomen radiating towards her vagina.  07/27: CT imaging abscess. Leukocytosis with left shift at 16.8. Initiated on IV Zosyn and Doxycycline. Admitted to OBGYN.  07/28: IR procedure 07/28 CT drain placement into pelvic abscess, left anterior approach, 12 Fr to bulb suction. Patient developed severe sepsis/septic shock.  She required transfer to stepdown unit, due to concerns for respiratory status PCCM consulted, receiving BS abx w/ zosyn, doxycycline & vancomycin, volume resuscitation w/ IVF, awaiting culture results   07/29: Remains in SDU w/ ileus and sepsis. IV antibiotics. Repeat CT abdomen pelvis , new loculations on the right, anasarca, distended urinary bladder. No contrast extravasation to  suggest bowel perforation 07/30: Continues to have abdominal pain, required NG, abdomen less distended.  07/31: repeat CT The abscess is slightly decreased compared to prior exam. Continuing on Unasyn and Diflucan.  08/01: VSS, WBC trending down, BCx NGx4d. Ileus no better. Drain in place, minimal output. NG in place. PICC and TPN initiated. Spoke w/ GYN, plan for surgical consult and ID consult.  8/02: Patient continued to have persistently elevated white cell count with abdominal pain.  General surgery has scheduled exploratory laparotomy. 8/03: She  underwent Exploratory laparotomy, sigmoid colectomy with colostomy creation, Hartmann procedure, lysis of additions.  Continue TPN and IV antibiotics. 08/04: Leukocytosis persist could be due to stress response.  Remains afebrile, continue NPO. and TPN, wound care consult, colostomy education. 08/05: NG tube removed, Foley removed.  Patient started on clear liquid diet 08/07: WBC remains elevated, continues to have pain.  General surgery ordered CT abdomen and pelvis to rule out intra-abdominal process. 08/08: Repeat CT scan abdomen shows persistent pockets of fluid. IR performed CT-guided placement of drain x 2 into RLQ, LLQ/pelvis. 08/09: WBC improving, tolerating CLD trial FLD today.  08/10: tolerating FLD, removed surgical drain, continue IR drains and wound VAC. Continue IV Abx (meropenem); new Cx pending (NGx12h); ID on board - plan for possible discontinuing antibiotics Monday 08/14.  08/11-08/13: weaning/off TPN and weaning off IV pain meds to po. 08/14  08/14-08/16: CMP/CBC stable. repeat CT Abd/Pelv: improved fluid collections, mild bowel distension. Gen surg planning pull drains 08/15 and 08/16, and will order to ostomy clinic. Unable to get home health services, may need to transition wound vac to wet-to-dry prior to discharge. Anticipate appropriate for discharge 08/16 08/16: VSS,  anemia stable, minimal stool via colostomy and pt somnolent  w/ Xanax in AM. Pt and family requesting hold discharge until ambulating and stooling better.   Assessment and Plan: Severe sepsis ([place]) without septic shock, d/t postoperative abscess, s/p pelvic drain placement  * Postoperative intra-abdominal abscess recent robotic lap for adhesiolysis and left salpingectomy and drainage of the left hydrosalpinx peritoneal cyst which was complicated by pelvic abscess secondary to sigmoid perforation Initially underwent pelvic drain placement by IR.  Multiple organisms of colon origin and culture.  E. coli, Bacteroides, Clostridium perfringens and yeast.  -Exploratory laparotomy and Hartman's procedure for perforated sigmoid colon and colostomy Abdominal pain Ileus, postoperative ([place]) Required PICC and TPN while in the hospital for nutrition Treated with IV: Unasyn and Diflucan Repeat CT scan showed improvement.  Surgery, OB/GYN, ID were following. Sepsis resolved with treatment  AKI (acute kidney injury) ([place]) in setting of sepsis, IV contrast - RESOLVED   Acute respiratory failure with hypoxia (HCC) in seting of MSKpain, atelectasis, severe sepsis  Now resolved         Consultants: Surgery, ID, OB/GYN, wound nurse Procedures performed: CT-guided drain placement into pelvic abscess on 7/28, another CT drain placement on 8/8 Disposition: Home Diet recommendation:  Discharge Diet Orders (From admission, onward)     Start     Ordered   04/11/22 0000  Diet - low sodium heart healthy        04/11/22 1008           Carb modified diet DISCHARGE MEDICATION: Allergies as of 04/11/2022       Reactions   Tylenol [acetaminophen] Other (See Comments)   Lingering Taste        Medication List     TAKE these medications    albuterol 108 (90 Base) MCG/ACT inhaler Commonly known as: VENTOLIN HFA Inhale 1-2 puffs into the lungs every 6 (six) hours as needed for wheezing or shortness of breath.   cyclobenzaprine 10 MG tablet Commonly  known as: FLEXERIL Take 1 tablet (10 mg total) by mouth 3 (three) times daily as needed for muscle spasms.   ibuprofen 800 MG tablet Commonly known as: ADVIL Take 1 tablet (800 mg total) by mouth every 8 (eight) hours as needed. What changed: Another medication with the same name was added. Make sure you understand how and when to take each.   ibuprofen 800 MG tablet Commonly known as: ADVIL Take 1 tablet (800 mg total) by mouth every 8 (eight) hours as needed. What changed: You were already taking a medication with the same name, and this prescription was added. Make sure you understand how and when to take each.   oxyCODONE 5 MG immediate release tablet Commonly known as: Oxy IR/ROXICODONE Take 1 tablet (5 mg total) by mouth every 6 (six) hours as needed for severe pain or breakthrough pain. What changed:  when to take this reasons to take this   pregabalin 200 MG capsule Commonly known as: LYRICA Take 1 capsule (200 mg total) by mouth 3 (three) times daily.   simethicone 80 MG chewable tablet Commonly known as: Gas-X Chew 1 tablet (80 mg total) by mouth 4 (four) times daily as needed for flatulence.   Vitamin D (Ergocalciferol) 1.25 MG (50000 UNIT) Caps capsule Commonly known as: DRISDOL Take 50,000 Units by mouth every 7 (seven) days.        Follow-up Information     [facility]. Go on 04/16/2022.   Specialty: General Surgery Why:  new colostomy, [place] Discharge F/UP, For wound re-check  Appointment @ 11:15 am. Contact information: [address] [id number] [place] [address] [phone]        Jules Husbands, MD. Go on 04/24/2022.   Specialty: General Surgery Why: s/p hartmans, [facility] Discharge F/UP, For wound re-check, If symptoms worsen  Appointment @ 9:30 am Contact information: [address] [place] [address] [phone]         Lacey Medici, DO. Go on  04/12/2022.   Specialty: Internal Medicine Why: as scheduled tomorrow Contact information: [address] [place] [address] [phone]                Discharge Exam: Danley Danker Weights   04/09/22 0500 04/10/22 0500 04/11/22 0500  Weight: 94.5 kg 96.3 kg 34.18 kg   36 year old female lying in the bed comfortably without any acute distress Lungs clear to auscultation bilaterally Cardiovascular regular rate and rhythm Abdomen soft, mild generalized tenderness.  Midline wound healing, colostomy in the left midabdomen Neuro alert and awake, nonfocal All her drains were removed and PICC line was removed Condition at discharge: fair  The results of significant diagnostics from this hospitalization (including imaging, microbiology, ancillary and laboratory) are listed below for reference.   Imaging Studies: CT ABDOMEN PELVIS W CONTRAST  Result Date: 04/08/2022 CLINICAL DATA:  Intra-abdominal abscess EXAM: CT ABDOMEN AND PELVIS WITH CONTRAST TECHNIQUE: Multidetector CT imaging of the abdomen and pelvis was performed using the standard protocol following bolus administration of intravenous contrast. RADIATION DOSE REDUCTION: This exam was performed according to the departmental dose-optimization program which includes automated exposure control, adjustment of the mA and/or kV according to patient size and/or use of iterative reconstruction technique. CONTRAST:  <See Chart> OMNIPAQUE IOHEXOL 300 MG/ML  SOLN COMPARISON:  CT 04/01/2022 FINDINGS: Lower chest: Bibasilar atelectasis. Hepatobiliary: Hepatic steatosis. Layering hyperdensity in the gallbladder, likely vicarious excretion of contrast. Pancreas: No ductal dilation or significant peripancreatic inflammatory change Spleen: Normal in size without focal abnormality. Adrenals/Urinary Tract: Adrenal glands are unremarkable. No hydronephrosis or nephrolithiasis. Moderate bladder distension. Stomach/Bowel: Left upper quadrant  colostomy. Residual oral contrast material within small bowel. Oral contrast had passed into the colon on the previous exam. No evidence of bowel obstruction.Generalized mesenteric edema, similar to prior. Mild generalized bowel distension compatible with ileus. Vascular/Lymphatic: No significant vascular findings. No lymphadenopathy. Reproductive: Unremarkable. Other: Recent abdominal incision with some packing material. Decreased size of the anterior right hemiabdomen fluid collection after percutaneous pigtail drainage catheter placement, now measuring 8.6 x 2.4 cm, previously 13.9 x 4.8 cm (series 2, image 50). Surgical drain has been removed. There is a new percutaneous pigtail catheter placed in the pelvis with significantly decreased size of the pelvic fluid collection, measuring 5.7 x 1.0 cm, previously 11.5 x 5.4 cm (series 2, image 71). Punctate focus of gas remains in the pelvis. Musculoskeletal: No acute or significant osseous findings. IMPRESSION: Decreased size of the abdominopelvic fluid collections after percutaneous pigtail drainage catheter placement, measurements above. Mild diffuse bowel distension compatible with reactive/postoperative ileus. Punctate residual pneumoperitoneum in the pelvis. Bibasilar atelectasis. Electronically Signed   By: Maurine Simmering M.D.   On: 04/08/2022 09:28   CT GUIDED PERITONEAL/RETROPERITONEAL FLUID DRAIN BY PERC CATH  Result Date: 04/02/2022 INDICATION: Intra-abdominal collections EXAM: 1. CT-guided placement of abdominal drainage catheter into the right lower quadrant collection 2. CT-guided placement of abdominal drainage catheter into left lower quadrant/pelvic collection TECHNIQUE: Multidetector CT imaging of the abdomen and  pelvis was performed following the standard protocol without IV contrast. RADIATION DOSE REDUCTION: This exam was performed according to the departmental dose-optimization program which includes automated exposure control, adjustment of the  mA and/or kV according to patient size and/or use of iterative reconstruction technique. MEDICATIONS: The patient is currently admitted to the hospital and receiving intravenous antibiotics. The antibiotics were administered within an appropriate time frame prior to the initiation of the procedure. ANESTHESIA/SEDATION: Moderate (conscious) sedation was employed during this procedure. A total of Versed 3 mg and Fentanyl 100 mcg was administered intravenously by the radiology nurse. Total intra-service moderate Sedation Time: 50 minutes. The patient's level of consciousness and vital signs were monitored continuously by radiology nursing throughout the procedure under my direct supervision. COMPLICATIONS: None immediate. PROCEDURE: Informed written consent was obtained from the patient after a thorough discussion of the procedural risks, benefits and alternatives. All questions were addressed. Maximal Sterile Barrier Technique was utilized including caps, mask, sterile gowns, sterile gloves, sterile drape, hand hygiene and skin antiseptic. A timeout was performed prior to the initiation of the procedure. The patient was placed supine on the exam table. Limited CT of the abdomen and pelvis was performed for planning purposes. This again demonstrated multiple fluid collections within the abdomen and pelvis. Attention was first turned to the right abdomen. Skin entry site was marked, and the overlying skin was prepped and draped in a standard sterile fashion. Local analgesia was obtained with 1% lidocaine. Using intermittent CT fluoroscopy, a 19 gauge Yueh needle was advanced towards the right infrahepatic fluid collection. Location was confirmed with CT and return of thick serous material. An 035 wire was advanced through the access needle. Over this wire, the percutaneous tract was serially dilated accommodate a 12 [place] multipurpose locking drainage catheter. Location was again confirmed with CT and return of  additional thick serous material. A sample was sent to the lab for analysis. The drainage catheter was secured to the skin using silk suture and a dressing. It was attached to bag drainage. Attention was then turned to the left side. Skin entry site was marked, and the overlying skin was prepped and draped in the standard sterile fashion. Local analgesia was obtained with 1% lidocaine. Using intermittent CT fluoroscopy, a 19 gauge Yueh needle was advanced towards the left lower quadrant/pelvic collection. Location was confirmed with CT and return of thick serous material. An 035 wire was advanced through the access needle. Over this wire, the percutaneous tract was serially dilated accommodate a 12 [place] multipurpose locking drainage catheter. Location was again confirmed with CT and return of additional thick serous material. A sample was sent to the lab for analysis. The drainage catheter was secured to the skin using silk suture and a dressing. It was attached to bulb suction. Patient tolerated all aspects of the procedure well without immediate complication. IMPRESSION: 1. Successful CT-guided placement of a 12 French locking drainage catheter into the right infrahepatic fluid collection. Thick serous fluid was returned. A sample was sent to the lab for microbiology analysis. 2. Successful CT-guided placement of a 12 French locking drainage catheter into the left lower quadrant/pelvic fluid collection. Thick serous fluid was returned. A sample was sent to the lab for microbiology analysis. Electronically Signed   By: Albin Felling M.D.   On: 04/02/2022 14:21   CT GUIDED PERITONEAL/RETROPERITONEAL FLUID DRAIN BY PERC CATH  Result Date: 04/02/2022 INDICATION: Intra-abdominal collections EXAM: 1. CT-guided placement of abdominal drainage catheter into the right lower quadrant collection 2.  CT-guided placement of abdominal drainage catheter into left lower quadrant/pelvic collection TECHNIQUE: Multidetector CT  imaging of the abdomen and pelvis was performed following the standard protocol without IV contrast. RADIATION DOSE REDUCTION: This exam was performed according to the departmental dose-optimization program which includes automated exposure control, adjustment of the mA and/or kV according to patient size and/or use of iterative reconstruction technique. MEDICATIONS: The patient is currently admitted to the hospital and receiving intravenous antibiotics. The antibiotics were administered within an appropriate time frame prior to the initiation of the procedure. ANESTHESIA/SEDATION: Moderate (conscious) sedation was employed during this procedure. A total of Versed 3 mg and Fentanyl 100 mcg was administered intravenously by the radiology nurse. Total intra-service moderate Sedation Time: 50 minutes. The patient's level of consciousness and vital signs were monitored continuously by radiology nursing throughout the procedure under my direct supervision. COMPLICATIONS: None immediate. PROCEDURE: Informed written consent was obtained from the patient after a thorough discussion of the procedural risks, benefits and alternatives. All questions were addressed. Maximal Sterile Barrier Technique was utilized including caps, mask, sterile gowns, sterile gloves, sterile drape, hand hygiene and skin antiseptic. A timeout was performed prior to the initiation of the procedure. The patient was placed supine on the exam table. Limited CT of the abdomen and pelvis was performed for planning purposes. This again demonstrated multiple fluid collections within the abdomen and pelvis. Attention was first turned to the right abdomen. Skin entry site was marked, and the overlying skin was prepped and draped in a standard sterile fashion. Local analgesia was obtained with 1% lidocaine. Using intermittent CT fluoroscopy, a 19 gauge Yueh needle was advanced towards the right infrahepatic fluid collection. Location was confirmed with CT and  return of thick serous material. An 035 wire was advanced through the access needle. Over this wire, the percutaneous tract was serially dilated accommodate a 12 [place] multipurpose locking drainage catheter. Location was again confirmed with CT and return of additional thick serous material. A sample was sent to the lab for analysis. The drainage catheter was secured to the skin using silk suture and a dressing. It was attached to bag drainage. Attention was then turned to the left side. Skin entry site was marked, and the overlying skin was prepped and draped in the standard sterile fashion. Local analgesia was obtained with 1% lidocaine. Using intermittent CT fluoroscopy, a 19 gauge Yueh needle was advanced towards the left lower quadrant/pelvic collection. Location was confirmed with CT and return of thick serous material. An 035 wire was advanced through the access needle. Over this wire, the percutaneous tract was serially dilated accommodate a 12 [place] multipurpose locking drainage catheter. Location was again confirmed with CT and return of additional thick serous material. A sample was sent to the lab for analysis. The drainage catheter was secured to the skin using silk suture and a dressing. It was attached to bulb suction. Patient tolerated all aspects of the procedure well without immediate complication. IMPRESSION: 1. Successful CT-guided placement of a 12 French locking drainage catheter into the right infrahepatic fluid collection. Thick serous fluid was returned. A sample was sent to the lab for microbiology analysis. 2. Successful CT-guided placement of a 12 French locking drainage catheter into the left lower quadrant/pelvic fluid collection. Thick serous fluid was returned. A sample was sent to the lab for microbiology analysis. Electronically Signed   By: Albin Felling M.D.   On: 04/02/2022 14:21   CT ABDOMEN PELVIS W CONTRAST  Result Date: 04/01/2022 CLINICAL DATA:  Five days status post  exploratory laparotomy and Hartmann procedure for perforated sigmoid colon. Abdominal pain. Low-grade fever. Increasing leukocytosis. Awaiting return of bowel function. EXAM: CT ABDOMEN AND PELVIS WITH CONTRAST TECHNIQUE: Multidetector CT imaging of the abdomen and pelvis was performed using the standard protocol following bolus administration of intravenous contrast. RADIATION DOSE REDUCTION: This exam was performed according to the departmental dose-optimization program which includes automated exposure control, adjustment of the mA and/or kV according to patient size and/or use of iterative reconstruction technique. CONTRAST:  136m OMNIPAQUE IOHEXOL 300 MG/ML  SOLN COMPARISON:  KUB 03/29/2022 and 03/27/2022; barium enema study demonstrating sigmoid colon bowel injury/perforation 03/26/2022; CT abdomen pelvis 03/25/2022 FINDINGS: Lower chest: Moderate bandlike atelectasis with air bronchograms within the bilateral posteromedial lower lobes, improved from 03/25/2022 prior CT. No pleural effusion. Heart size is at the upper limits of normal. Partial visualization of central venous catheter tip terminating at the superior vena cava/right atrial junction seen to be a right upper extremity PICC line on 03/31/2022 AP chest. Hepatobiliary: There is diffuse decreased density seen throughout the liver again suggesting fatty infiltration. There is layering contrast within the gallbladder likely from vicarious excretion. No intrahepatic or extrahepatic biliary ductal dilatation is seen. Pancreas: Unremarkable. No pancreatic ductal dilatation or surrounding inflammatory changes. Spleen: The spleen measures 13.7 cm in craniocaudal dimension, borderline enlarged and unchanged from prior. Adrenals/Urinary Tract: Normal bilateral adrenals. The kidneys enhance uniformly are symmetric in size without hydronephrosis. No focal urinary bladder wall thickening is seen. There is mild nondependent air within the urinary bladder lumen.  Stomach/Bowel: Oral contrast is seen as distal as the rectum. There is a left upper quadrant distal descending colostomy. This is new from prior. There now appears to be a blind-ending rectal pouch consistent with interval Hartmann procedure. There is mild-to-moderate wall thickening of the postsurgical proximal rectum. Oral contrast is seen as distal as small bowel within the mid to upper abdomen (axial series 2, image 34) with more distal small bowel not filling with oral contrast likely just due to the timing of contrast administration as no transition point or definitive location of small bowel obstruction is seen. Findings are compatible with postoperative ileus with air-fluid level seen throughout the small bowel and multifocal areas of mild-to-moderate wall thickening. The appendix normally fills with contrast without dilatation or inflammatory changes seen. Vascular/Lymphatic: No abdominal aortic aneurysm. No mesenteric, retroperitoneal, or pelvic lymphadenopathy. Reproductive: The uterus is present.  No gross adnexal abnormality. Other: A left pelvic approach drain is seen with tip curling within the right lower quadrant just inferior to the cecum. Interval removal of the prior left approach pigtail drainage catheter previously terminating within the left hemipelvis. There is mild interval increase in moderate fluid within the pelvis again with thin peripheral wall enhancement. Punctate focus of nondependent gas is seen within this fluid (axial series 2, image 73), decreased from prior. There is also a small focus of nondependent air within a portion of the pelvic fluid that extends into the right hemipelvis (axial image 70). There is mild interval increase in a thin rim enhancing fluid collection inferior to the liver, measuring up to approximately 4.8 by 13.9 by 16 cm (transverse by AP by craniocaudal) compared to approximately 3.3 x 10.0 x 16.7 cm on 03/25/2022. This fluid again extends down to the  pelvis and is now contiguous with the ascites within the pelvis. There is punctate gas within the anterior superior aspect of this fluid collection (axial images 41 through 44). Overall there  is mild-to-moderate ascites. There is a punctate focus of free air bordering the mid transverse dimension of the anterior inferior liver (axial series 2, image 29), decreased from prior. Punctate anterior midline abdominal pneumoperitoneum (axial series 2, image 54). Musculoskeletal: There is a new midline lower abdominal upper pelvic abdominal soft tissue defect with likely a vacuum assisted drain in this location. Moderate anasarca. No acute skeletal abnormality. IMPRESSION: Compared to 03/25/2022: 1. Interval likely partial sigmoid resection with new descending colon distal colostomy and new Hartmann pouch. 2. Interval removal of the prior left approach pigtail drainage catheter previously terminating within the left hemipelvis and placement of a new left approach drain terminating within the right lower quadrant just inferior to the cecum. Mild interval increase in fluid collections inferior to the liver extending into the right and left hemipelvis with small foci of air which may be secondary to infected nature of this fluid. 3. Punctate pneumoperitoneum, mildly decreased from prior. 4. Mild air within the nondependent urinary bladder. Recommend clinical correlation for recent instrumentation that would explain this. Please note gas forming infectious organism can also cause urinary bladder air, however no urinary bladder wall thickening is seen to suggest cystitis. Electronically Signed   By: Yvonne Kendall M.D.   On: 04/01/2022 16:33   DG Chest 1 View  Result Date: 03/31/2022 CLINICAL DATA:  Fever EXAM: CHEST  1 VIEW COMPARISON:  03/22/2022 FINDINGS: Right PICC line in place with the tip in the right atrium. Low lung volumes, bibasilar atelectasis. Heart is normal size. No effusions or acute bony abnormality.  IMPRESSION: Low lung volumes, bibasilar atelectasis. Electronically Signed   By: Rolm Baptise M.D.   On: 03/31/2022 19:04   DG Abd Portable 1V  Result Date: 03/29/2022 CLINICAL DATA:  Ileus. EXAM: PORTABLE ABDOMEN - 1 VIEW COMPARISON:  Abdominal radiographs 03/27/2022. FINDINGS: Unchanged position of an enteric tube which coils in the stomach with tip in the expected location of the gastric cardia. A surgical drain projects over the lower abdomen/upper pelvis. Persistent contrast within the colon and rectum. Previously demonstrated extraluminal contrast within the pelvis is no longer well appreciated. Persistent mild air distension of small bowel loops, although this has decreased as compared to the prior abdominal radiographs of 03/27/2022. IMPRESSION: Persistent although decreased mild air distention of small-bowel loops. Residual contrast within the colon and rectum. Findings are compatible with the provided history of ileus. Previously demonstrated extraluminal contrast within the pelvis is no longer well appreciated. Unchanged position of an enteric tube with tip terminating in the region of the gastric cardia. Electronically Signed   By: Kellie Simmering D.O.   On: 03/29/2022 08:40   DG Abd Portable 2V  Result Date: 03/27/2022 CLINICAL DATA:  Abdominal pain. History of postoperative pelvic abscess and sigmoid colon perforation. EXAM: PORTABLE ABDOMEN - 2 VIEW COMPARISON:  Multiple previous CT scans. Contrast enema study from yesterday. FINDINGS: The NG tube remains in good position. There is contrast noted throughout the colon from the study yesterday. Large collection of contrast in the pelvis in the region of the abscess drainage catheter consistent with the sigmoid colon leak seen yesterday. IMPRESSION: Large collection of contrast in the pelvis surrounding the pelvic drainage catheter in the region of the sigmoid colon leak seen yesterday. Electronically Signed   By: Marijo Sanes M.D.   On: 03/27/2022  08:42   DG BE (COLON)W SINGLE CM (SOL OR THIN BA)  Result Date: 03/26/2022 CLINICAL DATA:  Recent pelvic/gyn surgery. Postoperative pelvic abscess. Status post  percutaneous drain. Request for barium enema study to evaluate for possible recto-sigmoid perforation. EXAM: WATER-SOLUBLE CONTRAST ENEMA TECHNIQUE: Initial scout AP supine abdominal image obtained to insure adequate colon cleansing. Water-soluble contrast was introduced into the colon in a retrograde fashion and refluxed from the rectum to the cecum. Spot images of the colon followed by overhead radiographs were obtained. FLUOROSCOPY: Radiation Exposure Index (as provided by the fluoroscopic device): 102.40 mGy Kerma CONTRAST:  450 CC OF OMNIPAQUE 300 COMPARISON:  CT abdomen and pelvis from 03/25/2022 FINDINGS: Contrast is seen promptly filling the rectum as well as the sigmoid and descending colon. Contrast ultimately reaches the cecum without difficulty. There appears to be extraluminal contrast extravasation at the region of the sigmoid colon. Exact location is difficult to visualize. However there does appear to be contrast pooling at the location of the percutaneous drain catheter. IMPRESSION: Positive findings of contrast extravasation from the sigmoid colon consistent with bowel injury/perforation. Contrast appears to be predominantly pooling within the abscess cavity where percutaneous drainage catheter is presently in position. Read by: Ascencion Dike PA-C These results will be called to the ordering clinician or representative by the Radiologist Assistant, and communication documented in the PACS or [facility]. Electronically Signed   By: Kerby Moors M.D.   On: 03/26/2022 17:03   [place] EKG SITE RITE  Result Date: 03/26/2022 If Site Rite image not attached, placement could not be confirmed due to current cardiac rhythm.  CT ABDOMEN PELVIS W CONTRAST  Result Date: 03/25/2022 CLINICAL DATA:  Acute abdominal pain status post surgery.  EXAM: CT ABDOMEN AND PELVIS WITH CONTRAST TECHNIQUE: Multidetector CT imaging of the abdomen and pelvis was performed using the standard protocol following bolus administration of intravenous contrast. RADIATION DOSE REDUCTION: This exam was performed according to the departmental dose-optimization program which includes automated exposure control, adjustment of the mA and/or kV according to patient size and/or use of iterative reconstruction technique. CONTRAST:  117m OMNIPAQUE IOHEXOL 300 MG/ML  SOLN COMPARISON:  February 21, 2022. FINDINGS: Lower chest: Mild bilateral posterior basilar subsegmental atelectasis is noted. Hepatobiliary: Hepatic steatosis. No gallstones or biliary dilatation is noted. Pancreas: Unremarkable. No pancreatic ductal dilatation or surrounding inflammatory changes. Spleen: Normal in size without focal abnormality. Adrenals/Urinary Tract: Adrenal glands and kidneys appear normal. No hydronephrosis or renal obstruction is noted. Urinary bladder is decompressed secondary to Foley catheter. Stomach/Bowel: Nasogastric tube tip is seen with tip in proximal stomach. The stomach is otherwise unremarkable. The appendix appears normal. Mild proximal small bowel dilatation is noted which is improved slightly compared to prior exam. Vascular/Lymphatic: No significant vascular findings are present. No enlarged abdominal or pelvic lymph nodes. Reproductive: Status post left salpingectomy. Right adnexal region and uterus are unremarkable. Other: Continued presence of percutaneous drainage catheter seen in left-sided pelvic abscess. The abscess currently measures 8.6 x 3.3 cm which is slightly decreased compared to prior exam. Mild ascites is noted in the pelvis and right side of the abdomen. No hernia is noted. Mild amount of pneumoperitoneum remains consistent with recent postoperative status. Musculoskeletal: No acute or significant osseous findings. IMPRESSION: Continued presence of percutaneous  drainage catheter is seen in left-sided abdominal abscess. The abscess currently measures 8.6 x 3.3 cm which is slightly decreased compared to prior exam. Mild proximal small bowel dilatation is noted which is slightly improved compared to prior exam, most consistent with ileus. Mild amount of pneumoperitoneum is noted consistent with recent postoperative status. Stable mild ascites is noted in the pelvis and right side of  the abdomen. Hepatic steatosis. Urinary bladder is decompressed secondary to Foley catheter. Mild bilateral posterior basilar subsegmental atelectasis is noted. Electronically Signed   By: Marijo Conception M.D.   On: 03/25/2022 13:05   DG Abd 1 View  Result Date: 03/24/2022 CLINICAL DATA:  NG tube placement EXAM: ABDOMEN - 1 VIEW COMPARISON:  Chest radiograph 03/22/2022 FINDINGS: Enteric tube tip is coiled in the left upper quadrant consistent with location in the body of the stomach. Shallow inspiration with atelectasis in the lung bases. Gas-filled distended upper abdominal small bowel may indicate obstruction. IMPRESSION: Enteric tube tip projects over the body of the stomach. Atelectasis in the lung bases. Gaseous distention of small bowel. Electronically Signed   By: Lucienne Capers M.D.   On: 03/24/2022 01:33   CT ABDOMEN PELVIS W CONTRAST  Result Date: 03/23/2022 CLINICAL DATA:  History of robotic salpingectomy on 03/18/2022. Patient is now status post CT-guided placement of a drainage catheter into abdominopelvic abscess on 03/22/2022. Patient is having severe upper abdominal pain. EXAM: CT CHEST, ABDOMEN AND PELVIS WITHOUT CONTRAST TECHNIQUE: Multidetector CT imaging of the chest, abdomen and pelvis was performed following the standard protocol without IV contrast. RADIATION DOSE REDUCTION: This exam was performed according to the departmental dose-optimization program which includes automated exposure control, adjustment of the mA and/or kV according to patient size and/or use of  iterative reconstruction technique. COMPARISON:  None Available. CT abdomen pelvis 03/21/2022. FINDINGS: CT CHEST FINDINGS Cardiovascular: No significant vascular findings. Normal heart size. No pericardial effusion. Mediastinum/Nodes: No enlarged mediastinal or axillary lymph nodes. Hilar lymph nodes are not well visualized on the unenhanced exam. Thyroid gland, trachea, and esophagus demonstrate no significant findings. Lungs/Pleura: Segmental atelectasis in the medial lower lobes. Additional non segmental atelectasis in the right upper lobe. No pleural effusion or pneumothorax. Musculoskeletal: No chest wall mass or suspicious bone lesions identified. CT ABDOMEN PELVIS FINDINGS Hepatobiliary: Diffuse decreased attenuation of the liver. No focal liver abnormality is seen. Increased bile density within the gallbladder, likely reflecting vicarious contrast excretion. No gallstones, gallbladder wall thickening, or biliary dilatation. Pancreas: Unremarkable. No pancreatic ductal dilatation or surrounding inflammatory changes. Spleen: Normal in size without focal abnormality. Adrenals/Urinary Tract: Adrenal glands are unremarkable. Kidneys are normal, without renal calculi, focal lesion, or hydronephrosis. The urinary bladder is markedly distended but otherwise unremarkable. Stomach/Bowel: The stomach is moderately distended. Multiple dilated proximal and mid small bowel loops measuring up to 3.9 cm in diameter with air-fluid levels (series 7, image 31) the distal ileum is decompressed. There is a gradual tapering of the small bowel caliber without distinct transition point. Submucosal fat deposition throughout the ascending colon, suggestive of sequela of chronic inflammation. The colon is decompressed with mild bowel wall thickening of the sigmoid colon adjacent to the abdominopelvic abscess. Vascular/Lymphatic: No significant vascular findings are present. No enlarged abdominal or pelvic lymph nodes. Reproductive:  Uterus and right adnexa are unremarkable. Other: Interval placement of a pigtail drainage catheter within the loculated gas containing fluid collection centered at the left rectouterine pouch. The fluid collection is mildly decreased in size compared to 03/21/2022, measuring 12.8 cm craniocaudal dimension x 4.8 cm AP dimension on sagittal plane compared to 13 cm x 6.4 cm on previous study when measured in a similar fashion (series 6, image 78). Since 03/21/2022, there is increased abdominopelvic ascites, particularly within the right mid and lower abdomen. A fluid collection in the right lower contains a few small foci of air but does not have a thin rim  of enhancement to suggest a loculated fluid collection (series 7, image 60). Small amount of non dependent free air is again noted within the upper abdomen, consistent with recent postoperative status. Musculoskeletal: No acute or significant osseous findings. IMPRESSION: CT chest: 1. Segmental atelectasis in the lower lobes and additional subsegmental atelectasis in the right upper lobe. No pleural effusion. CT abdomen pelvis: Interval 1. Mild interval decreased size of the abdominopelvic abscess centered in the left rectouterine pouch status post pigtail drainage catheter placement. 2. New abdominopelvic ascites, particularly within the right abdomen, without discrete loculated drainable fluid collection. 3. New dilatation of multiple small bowel loops with air-fluid levels and moderate distention of the gas and fluid-filled stomach. No distinct transition point. Findings are favored to represent adynamic ileus secondary to inflammatory changes in the left pelvis. 4. Diffuse mild bowel wall thickening of the sigmoid colon secondary to adjacent inflammatory changes related to abdominopelvic abscess. 5. Small volume pneumoperitoneum, consistent with recent postoperative status. 6. Hepatic steatosis. Electronically Signed   By: Ileana Roup M.D.   On: 03/23/2022  15:47   CT CHEST WO CONTRAST  Result Date: 03/23/2022 CLINICAL DATA:  History of robotic salpingectomy on 03/18/2022. Patient is now status post CT-guided placement of a drainage catheter into abdominopelvic abscess on 03/22/2022. Patient is having severe upper abdominal pain. EXAM: CT CHEST, ABDOMEN AND PELVIS WITHOUT CONTRAST TECHNIQUE: Multidetector CT imaging of the chest, abdomen and pelvis was performed following the standard protocol without IV contrast. RADIATION DOSE REDUCTION: This exam was performed according to the departmental dose-optimization program which includes automated exposure control, adjustment of the mA and/or kV according to patient size and/or use of iterative reconstruction technique. COMPARISON:  None Available. CT abdomen pelvis 03/21/2022. FINDINGS: CT CHEST FINDINGS Cardiovascular: No significant vascular findings. Normal heart size. No pericardial effusion. Mediastinum/Nodes: No enlarged mediastinal or axillary lymph nodes. Hilar lymph nodes are not well visualized on the unenhanced exam. Thyroid gland, trachea, and esophagus demonstrate no significant findings. Lungs/Pleura: Segmental atelectasis in the medial lower lobes. Additional non segmental atelectasis in the right upper lobe. No pleural effusion or pneumothorax. Musculoskeletal: No chest wall mass or suspicious bone lesions identified. CT ABDOMEN PELVIS FINDINGS Hepatobiliary: Diffuse decreased attenuation of the liver. No focal liver abnormality is seen. Increased bile density within the gallbladder, likely reflecting vicarious contrast excretion. No gallstones, gallbladder wall thickening, or biliary dilatation. Pancreas: Unremarkable. No pancreatic ductal dilatation or surrounding inflammatory changes. Spleen: Normal in size without focal abnormality. Adrenals/Urinary Tract: Adrenal glands are unremarkable. Kidneys are normal, without renal calculi, focal lesion, or hydronephrosis. The urinary bladder is markedly  distended but otherwise unremarkable. Stomach/Bowel: The stomach is moderately distended. Multiple dilated proximal and mid small bowel loops measuring up to 3.9 cm in diameter with air-fluid levels (series 7, image 31) the distal ileum is decompressed. There is a gradual tapering of the small bowel caliber without distinct transition point. Submucosal fat deposition throughout the ascending colon, suggestive of sequela of chronic inflammation. The colon is decompressed with mild bowel wall thickening of the sigmoid colon adjacent to the abdominopelvic abscess. Vascular/Lymphatic: No significant vascular findings are present. No enlarged abdominal or pelvic lymph nodes. Reproductive: Uterus and right adnexa are unremarkable. Other: Interval placement of a pigtail drainage catheter within the loculated gas containing fluid collection centered at the left rectouterine pouch. The fluid collection is mildly decreased in size compared to 03/21/2022, measuring 12.8 cm craniocaudal dimension x 4.8 cm AP dimension on sagittal plane compared to 13 cm x 6.4  cm on previous study when measured in a similar fashion (series 6, image 78). Since 03/21/2022, there is increased abdominopelvic ascites, particularly within the right mid and lower abdomen. A fluid collection in the right lower contains a few small foci of air but does not have a thin rim of enhancement to suggest a loculated fluid collection (series 7, image 60). Small amount of non dependent free air is again noted within the upper abdomen, consistent with recent postoperative status. Musculoskeletal: No acute or significant osseous findings. IMPRESSION: CT chest: 1. Segmental atelectasis in the lower lobes and additional subsegmental atelectasis in the right upper lobe. No pleural effusion. CT abdomen pelvis: Interval 1. Mild interval decreased size of the abdominopelvic abscess centered in the left rectouterine pouch status post pigtail drainage catheter placement.  2. New abdominopelvic ascites, particularly within the right abdomen, without discrete loculated drainable fluid collection. 3. New dilatation of multiple small bowel loops with air-fluid levels and moderate distention of the gas and fluid-filled stomach. No distinct transition point. Findings are favored to represent adynamic ileus secondary to inflammatory changes in the left pelvis. 4. Diffuse mild bowel wall thickening of the sigmoid colon secondary to adjacent inflammatory changes related to abdominopelvic abscess. 5. Small volume pneumoperitoneum, consistent with recent postoperative status. 6. Hepatic steatosis. Electronically Signed   By: Ileana Roup M.D.   On: 03/23/2022 15:47   DG Chest 2 View  Result Date: 03/22/2022 CLINICAL DATA:  Shortness of breath. EXAM: CHEST - 2 VIEW COMPARISON:  Two-view chest x-ray 07/26/2016.  CT chest 03/21/2022 FINDINGS: Low lung volumes exaggerate the heart size. Left lower lobe airspace opacities are again noted. No other significant airspace disease is present. IMPRESSION: Persistent left lower lobe airspace disease concerning for pneumonia. Electronically Signed   By: San Morelle M.D.   On: 03/22/2022 21:46   CT IMAGE GUIDED DRAINAGE PERCUT CATH  PERITONEAL RETROPERIT  Result Date: 03/22/2022 INDICATION: Abdominopelvic abscess EXAM: CT-guided placement of drainage catheter into abdominopelvic abscess TECHNIQUE: Multidetector CT imaging of the abdomen and pelvis was performed following the standard protocol without IV contrast. RADIATION DOSE REDUCTION: This exam was performed according to the departmental dose-optimization program which includes automated exposure control, adjustment of the mA and/or kV according to patient size and/or use of iterative reconstruction technique. MEDICATIONS: Per EMR ANESTHESIA/SEDATION: Local analgesia COMPLICATIONS: None immediate. PROCEDURE: Informed written consent was obtained from the patient after a thorough  discussion of the procedural risks, benefits and alternatives. All questions were addressed. Maximal Sterile Barrier Technique was utilized including caps, mask, sterile gowns, sterile gloves, sterile drape, hand hygiene and skin antiseptic. A timeout was performed prior to the initiation of the procedure. The patient was placed supine on the exam table. Limited CT of the abdomen and pelvis was performed for planning purposes. This again demonstrated an air in fluid collection in the lower abdomen and pelvis. Skin entry site was marked with a planned anterolateral approach from the left lower quadrant. The overlying skin was prepped and draped in a standard sterile fashion. Local analgesia was obtained with 1% lidocaine. Using intermittent CT fluoroscopy, an 18 gauge trocar needle was advanced towards the identified abdominopelvic fluid collection. Access was confirmed with CT and return of purulent material. An 035 wire was then advanced through the access needle, over which the percutaneous tract was serially dilated to accommodate a 12 [place] multipurpose locking drainage catheter. Location was again confirmed with CT and return of additional purulent material. Catheter was secured to the skin using silk  suture and a dressing. It was attached to bulb suction. The patient tolerated the procedure well without immediate complication. IMPRESSION: Successful CT-guided placement of a 12 French locking drainage catheter into the lower abdominopelvic abscess. Sample sent to the lab for microbiology analysis. Electronically Signed   By: Albin Felling M.D.   On: 03/22/2022 15:12   CT ABDOMEN PELVIS W CONTRAST  Result Date: 03/21/2022 CLINICAL DATA:  Abdominal pain, post-op Abdominal pain, acute, nonlocalized. Robotic salpingectomy 03/18/2022 EXAM: CT ABDOMEN AND PELVIS WITH CONTRAST TECHNIQUE: Multidetector CT imaging of the abdomen and pelvis was performed using the standard protocol following bolus administration of  intravenous contrast. RADIATION DOSE REDUCTION: This exam was performed according to the departmental dose-optimization program which includes automated exposure control, adjustment of the mA and/or kV according to patient size and/or use of iterative reconstruction technique. CONTRAST:  169m OMNIPAQUE IOHEXOL 300 MG/ML  SOLN COMPARISON:  None Available. FINDINGS: Lower chest: Left base atelectasis.  No acute abnormality. Hepatobiliary: Liver is enlarged measuring up to 19 cm. The hepatic parenchyma is diffusely hypodense compared to the splenic parenchyma consistent with fatty infiltration. No focal liver abnormality. No gallstones, gallbladder wall thickening, or pericholecystic fluid. No biliary dilatation. Pancreas: No focal lesion. Normal pancreatic contour. No surrounding inflammatory changes. No main pancreatic ductal dilatation. Spleen: Normal in size without focal abnormality. Adrenals/Urinary Tract: No adrenal nodule bilaterally. Bilateral kidneys enhance symmetrically. No hydronephrosis. No hydroureter. The urinary bladder is unremarkable. On delayed imaging, there is no urothelial wall thickening and there are no filling defects in the opacified portions of the bilateral collecting systems or ureters as well as urinary bladder. Stomach/Bowel: Stomach is within normal limits. No evidence of bowel wall thickening or dilatation. Appendix appears normal. Vascular/Lymphatic: No abdominal aorta or iliac aneurysm. No abdominal, pelvic, or inguinal lymphadenopathy. Reproductive: Uterus is unremarkable no adnexal mass identified. Other: No intraperitoneal free fluid. Small volume free intraperitoneal gas. There is a gas and fluid collection with peripheral enhancement measuring approximately 8 x 9 x 7.5 cm centered within the left rectouterine pouch. Musculoskeletal: No abdominal wall hernia or abnormality. No suspicious lytic or blastic osseous lesions. No acute displaced fracture. IMPRESSION: 1. An 8 x 9 x  7.5cm gas and fluid collection centered within the left rectouterine pouch likely represents a developing abscess. No findings to suggest bowel injury; however, this is not fully excluded. If a CT is repeated in the near future, consider use of IV and PO contrast. 2. Small volume pneumoperitoneum likely postsurgical in etiology. 3. Hepatomegaly and hepatic steatosis. Electronically Signed   By: MIven FinnM.D.   On: 03/21/2022 18:43    Microbiology: Results for orders placed or performed during the hospital encounter of 03/21/22  Culture, blood (x 2)     Status: None   Collection Time: 03/22/22 10:48 AM   Specimen: BLOOD  Result Value Ref Range Status   Specimen Description BLOOD BLOOD LEFT HAND  Final   Special Requests   Final    BOTTLES DRAWN AEROBIC AND ANAEROBIC Blood Culture adequate volume   Culture   Final    NO GROWTH 5 DAYS [facility] [address], [place] [address]   Report Status 03/27/2022 FINAL  Final  Culture, blood (x 2)     Status: None   Collection Time: 03/22/22 11:00 AM   Specimen: BLOOD  Result Value Ref Range Status   Specimen Description BLOOD BLOOD RIGHT HAND  Final   Special Requests AEROBIC BOTTLE ONLY  Blood Culture adequate volume  Final   Culture   Final    NO GROWTH 5 DAYS Performed at [facility], [address] [place], [place] [address]    Report Status 03/27/2022 FINAL  Final  Aerobic/Anaerobic Culture w Gram Stain (surgical/deep wound)     Status: None   Collection Time: 03/22/22 12:57 PM   Specimen: Wound; Abscess  Result Value Ref Range Status   Specimen Description   Final    WOUND Performed at [facility], [address] [place], [place] [address]    Special Requests   Final    ABD ABSCESS Performed at [facility], [address] [place], [place] [address]    Gram Stain   Final    ABUNDANT WBC PRESENT,BOTH PMN AND MONONUCLEAR ABUNDANT GRAM NEGATIVE RODS FEW  GRAM POSITIVE RODS FEW GRAM POSITIVE COCCI IN PAIRS FEW BUDDING YEAST SEEN    Culture   Final    ABUNDANT ESCHERICHIA COLI MODERATE CLOSTRIDIUM PERFRINGENS Standardized susceptibility testing for this organism is not available. ABUNDANT BACTEROIDES VULGATUS BETA LACTAMASE NEGATIVE Performed at [facility], [place] [address], [place], [place] [address]    Report Status 03/25/2022 FINAL  Final   Organism ID, Bacteria ESCHERICHIA COLI  Final      Susceptibility   Escherichia coli - MIC*    AMPICILLIN 8 SENSITIVE Sensitive     CEFAZOLIN <=4 SENSITIVE Sensitive     CEFEPIME <=0.12 SENSITIVE Sensitive     CEFTAZIDIME <=1 SENSITIVE Sensitive     CEFTRIAXONE <=0.25 SENSITIVE Sensitive     CIPROFLOXACIN <=0.25 SENSITIVE Sensitive     GENTAMICIN <=1 SENSITIVE Sensitive     IMIPENEM <=0.25 SENSITIVE Sensitive     TRIMETH/SULFA <=20 SENSITIVE Sensitive     AMPICILLIN/SULBACTAM <=2 SENSITIVE Sensitive     PIP/TAZO <=4 SENSITIVE Sensitive     * ABUNDANT ESCHERICHIA COLI  MRSA Next Gen by PCR, Nasal     Status: None   Collection Time: 03/22/22 11:00 PM   Specimen: Nasal Mucosa; Nasal Swab  Result Value Ref Range Status   MRSA by PCR Next Gen NOT DETECTED NOT DETECTED Final    Comment: (NOTE) The GeneXpert MRSA Assay (FDA approved for NASAL specimens only), is one component of a comprehensive MRSA colonization surveillance program. It is not intended to diagnose MRSA infection nor to guide or monitor treatment for MRSA infections. Test performance is not FDA approved in patients less than 62 years old. Performed at [facility], [address], [place], [place] [address]   Urine Culture     Status: None   Collection Time: 03/24/22  6:02 PM   Specimen: Urine, Catheterized  Result Value Ref Range Status   Specimen Description   Final    URINE, CATHETERIZED Performed at [facility], [address], [place], [place] [address]    Special Requests   Final     Normal Performed at [facility], [address], [place], [place] [address]    Culture   Final    NO GROWTH Performed at [facility], [place] [address], [place], [place] [address]    Report Status 03/25/2022 FINAL  Final  Aerobic/Anaerobic Culture w Gram Stain (surgical/deep wound)     Status: None   Collection Time: 04/02/22  1:00 PM   Specimen: Abscess  Result Value Ref Range Status   Specimen Description ABSCESS  Final   Special Requests RLQ ABDOMINAL ABSCESS  Final   Gram  Stain   Final    ABUNDANT WBC PRESENT,BOTH PMN AND MONONUCLEAR NO ORGANISMS SEEN    Culture   Final    No growth aerobically or anaerobically. Performed at [facility], [place] [address] [place], [place] [address]    Report Status 04/07/2022 FINAL  Final  Aerobic/Anaerobic Culture w Gram Stain (surgical/deep wound)     Status: None   Collection Time: 04/02/22  1:01 PM   Specimen: Abscess  Result Value Ref Range Status   Specimen Description ABSCESS  Final   Special Requests LLQ ABDOMINAL ABSCESS  Final   Gram Stain   Final    ABUNDANT WBC PRESENT,BOTH PMN AND MONONUCLEAR NO ORGANISMS SEEN    Culture   Final    No growth aerobically or anaerobically. Performed at [facility], [place] [address] [place], [place] [address]    Report Status 04/07/2022 FINAL  Final    Labs: CBC: Recent Labs  Lab 04/08/22 0448 04/09/22 0855  WBC 7.2 5.4  HGB 7.9* 8.0*  HCT 25.4* 25.7*  MCV 90.4 90.2  PLT 432* 791   Basic Metabolic Panel: Recent Labs  Lab 04/08/22 0448 04/11/22 0511  NA 138 140  K 4.3 3.9  CL 104 108  CO2 28 28  GLUCOSE 101* 93  BUN 13 10  CREATININE 0.55 0.54  CALCIUM 8.3* 8.5*   Liver Function Tests: Recent Labs  Lab 04/08/22 0448 04/11/22 0511  AST 40 36  ALT 43 35  ALKPHOS 74 58  BILITOT 0.7 0.8  PROT 6.0* 6.1*  ALBUMIN 2.4* 2.6*   CBG: Recent Labs  Lab 04/06/22 2107 04/06/22 2258 04/07/22 0455  GLUCAP 103* 110* 100*    Discharge time  spent: greater than 30 minutes.  Signed: Max Sane, MD [facility] 04/13/2022

## 2022-04-16 ENCOUNTER — Telehealth: Payer: Self-pay | Admitting: *Deleted

## 2022-04-16 ENCOUNTER — Ambulatory Visit (HOSPITAL_COMMUNITY)
Admit: 2022-04-16 | Discharge: 2022-04-16 | Disposition: A | Discharge status: Home / Self Care | Payer: No Typology Code available for payment source | Attending: Nurse Practitioner | Admitting: Nurse Practitioner

## 2022-04-16 DIAGNOSIS — L24B3 Irritant contact dermatitis related to fecal or urinary stoma or fistula: Secondary | ICD-10-CM

## 2022-04-16 DIAGNOSIS — L258 Unspecified contact dermatitis due to other agents: Secondary | ICD-10-CM | POA: Insufficient documentation

## 2022-04-16 DIAGNOSIS — K9400 Colostomy complication, unspecified: Secondary | ICD-10-CM

## 2022-04-16 DIAGNOSIS — Z433 Encounter for attention to colostomy: Secondary | ICD-10-CM | POA: Insufficient documentation

## 2022-04-16 DIAGNOSIS — I96 Gangrene, not elsewhere classified: Secondary | ICD-10-CM | POA: Insufficient documentation

## 2022-04-16 NOTE — Telephone Encounter (Signed)
Faxed FMLA to [employer] Claims Department at [phone]

## 2022-04-16 NOTE — Progress Notes (Signed)
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  All other systems reviewed and are negative.  Vital signs:  Temp 98.5 F (36.9 C) (Oral)  Exam:  Physical Exam Vitals reviewed.  Abdominal:     Palpations: Abdomen is soft.     Comments: colostomy  Skin:    Findings: Rash present.  Neurological:     Mental Status: She is alert and oriented to person, place, and time.  Psychiatric:     Comments: Anxious, tearful about ostomy care     Stoma type/location:  LMQ colostomy Stomal assessment/size:  1 1/4" stoma, mucosa sloughing with tan, malodorous tissue that remains adherent to stoma.   Peristomal assessment:  midline surgical incision, improving  Wet to dry dressing in place.  Rounded abdomen Treatment options for stomal/peristomal skin: Barrier ring and 2piece pouch.  I have recommended convex pouch system as stoma will be flush once sloughing is complete.  I have recommended a belt.  I demonstrate a convex pouch and discuss cutting, etc.  These items are in there secure start kit at home and they will begin them.  They would like me to set them up with [employer] for supplies.  Output: thick brown stool.  We discuss constipation.  Due to narcotic analgesia, decreased activity and PO intake, bowels are sluggish.  She will begin miralax daily to improve motility.  Ostomy pouching: 2pc.2 1/4" pouch (prefers 2 piece)  Education provided:  see above  constipation    Impression/dx  Contact dermatitis Sloughing stoma  Discussion  See back in one week Plan  Appointment made    Visit time: 70 minutes.   Domenic Moras FNP-BC

## 2022-04-19 ENCOUNTER — Ambulatory Visit: Payer: No Typology Code available for payment source | Admitting: Internal Medicine

## 2022-04-19 ENCOUNTER — Encounter: Payer: Self-pay | Admitting: Internal Medicine

## 2022-04-19 VITALS — BP 118/72 | HR 105 | Temp 97.8°F | Resp 18 | Ht 63.0 in | Wt 190.6 lb

## 2022-04-19 DIAGNOSIS — D649 Anemia, unspecified: Secondary | ICD-10-CM

## 2022-04-19 DIAGNOSIS — R635 Abnormal weight gain: Secondary | ICD-10-CM

## 2022-04-19 DIAGNOSIS — Z1159 Encounter for screening for other viral diseases: Secondary | ICD-10-CM

## 2022-04-19 DIAGNOSIS — Z09 Encounter for follow-up examination after completed treatment for conditions other than malignant neoplasm: Secondary | ICD-10-CM

## 2022-04-19 DIAGNOSIS — R0601 Orthopnea: Secondary | ICD-10-CM

## 2022-04-19 DIAGNOSIS — E781 Pure hyperglyceridemia: Secondary | ICD-10-CM | POA: Diagnosis not present

## 2022-04-19 DIAGNOSIS — Z114 Encounter for screening for human immunodeficiency virus [HIV]: Secondary | ICD-10-CM

## 2022-04-19 DIAGNOSIS — R221 Localized swelling, mass and lump, neck: Secondary | ICD-10-CM

## 2022-04-19 DIAGNOSIS — E559 Vitamin D deficiency, unspecified: Secondary | ICD-10-CM

## 2022-04-19 NOTE — Patient Instructions (Addendum)
It was great seeing you today!  Plan discussed at today's visit: -Blood work ordered today, results will be uploaded to MyChart.  -Continue Ibuprofen 800 mg twice a day/as needed and muscle relaxer. Can discontinue Lyrica -Ultrasound of the neck ordered, we will call you to schedule this test  Follow up in: 1 month   Take care and let us know if you have any questions or concerns prior to your next visit.  Dr. Rosana Berger

## 2022-04-19 NOTE — Progress Notes (Signed)
New Patient Office Visit  Subjective    Patient ID: Lacey Harrison, female    DOB: 1986-03-10  Age: 36 y.o. MRN: [id number]  CC:  Chief Complaint  Patient presents with   Establish Care    HPI Lacey Harrison presents to establish care. Chronic medical conditions include weight gain, depression, however was recently inpatient for several weeks due to complications from surgery.  On 03/18/2022 she underwent robotic adhesiolysis, left salpingectomy, with drainage of 500 mL from large left peritoneal cyst and chromotubation and was discharged home the same day without complication.  She then returned to the emergency room on 7/27 after experiencing sudden acute abdominal pain.  CT at that time showed intra-abdominal abscess with leukocytosis.  She was started on IV antibiotics and eventually went into septic shock.  She required PICC and TPN.  On 8/30 she underwent exploratory laparotomy and subsequent sigmoid colectomy with colostomy creation, Hartmann procedure and lysis of adhesions.  She continued to improve with IV antibiotics and was discharged home on 04/10/2022.  She is currently taking ibuprofen 800 mg twice daily for pain, Flexeril 10 mg 3 times daily as well as Lyrica 200 mg about once daily.  She states that her pain is well controlled.  She endorses appropriate stool passage with ostomy.  She is eating appropriately.  She is passing urine appropriately.  She is planning on following with her general surgeon next week.  Of note, labs of day of discharge showed decreased albumin and total protein, as well as hemoglobin 8.0.  Since the hospitalization, she is doing progressively better.  She does note feeling short of breath like she cannot take a full inhalation while laying flat on her back.  She denies shortness of breath in any other position.  She is now sleeping propped up on 1 pillow.  She denies cough or wheeze.  She denies chest pain or palpitations.  She does endorse snoring.  She  denies witnessed apneic events or gasping for air in her sleep.  Prior to this hospitalization, the patient was healthy and was not taking any daily medications.  Some concerns prior to her hospitalization include trouble losing weight, mild depression and neck pain. Does have a history of elevate triglycerides as well, previous provider recommended a statin which the patient is not currently taking. Mother also has hypertriglyceridemia.  She has had left sided neck pain and muscle spasms for several years.  This comes and goes.  She states now that she is taking the Flexeril her pain and spasms are actually improved.  She notes a fullness in the paraspinal muscles of the left side of her neck.  This makes it difficult for her to sleep.  She had tried PT in the past, which did help minimally.  Outpatient Encounter Medications as of 04/19/2022  Medication Sig   cyclobenzaprine (FLEXERIL) 10 MG tablet Take 1 tablet (10 mg total) by mouth 3 (three) times daily as needed for muscle spasms.   ibuprofen (ADVIL) 800 MG tablet Take 1 tablet (800 mg total) by mouth every 8 (eight) hours as needed.   pregabalin (LYRICA) 200 MG capsule Take 1 capsule (200 mg total) by mouth 3 (three) times daily.   Vitamin D, Ergocalciferol, (DRISDOL) 1.25 MG (50000 UNIT) CAPS capsule Take 50,000 Units by mouth every 7 (seven) days.   [DISCONTINUED] albuterol (VENTOLIN HFA) 108 (90 Base) MCG/ACT inhaler Inhale 1-2 puffs into the lungs every 6 (six) hours as needed for wheezing or shortness of  breath.   [DISCONTINUED] ibuprofen (ADVIL) 800 MG tablet Take 1 tablet (800 mg total) by mouth every 8 (eight) hours as needed.   [DISCONTINUED] oxyCODONE (OXY IR/ROXICODONE) 5 MG immediate release tablet Take 1 tablet (5 mg total) by mouth every 6 (six) hours as needed for severe pain or breakthrough pain.   [DISCONTINUED] simethicone (GAS-X) 80 MG chewable tablet Chew 1 tablet (80 mg total) by mouth 4 (four) times daily as needed for  flatulence.   No facility-administered encounter medications on file as of 04/19/2022.    Past Medical History:  Diagnosis Date   Allergy    Anxiety    Chicken pox    Colostomy in place [facility])    Depression    Heart murmur    Lyme disease    patient reports a remote history of lyme and alludes that she has "chronic lyme"   Migraines    MIGRAINES   Pneumonia 07/2017    Past Surgical History:  Procedure Laterality Date   CHROMOPERTUBATION  02/16/2018   Procedure: CHROMOPERTUBATION;  Surgeon: Schermerhorn, Gwen Her, MD;  Location: [facility] ORS;  Service: Gynecology;;   CHROMOPERTUBATION Left 03/18/2022   Procedure: CHROMOPERTUBATION;  Surgeon: Rubie Maid, MD;  Location: [facility] ORS;  Service: Gynecology;  Laterality: Left;   COLECTOMY WITH COLOSTOMY CREATION/HARTMANN PROCEDURE N/A 03/27/2022   Procedure: SIGMOID COLECTOMY WITH COLOSTOMY CREATION/HARTMANN PROCEDURE; LYSIS OF ADHESIONS;  Surgeon: Jules Husbands, MD;  Location: [facility] ORS;  Service: General;  Laterality: N/A;   CYSTECTOMY Left    OVARY   DILATION AND CURETTAGE OF UTERUS  2008   LAPAROSCOPIC LYSIS OF ADHESIONS N/A 02/16/2018   Procedure: EXTENSIVE LAPAROSCOPIC LYSIS OF ADHESIONS;  Surgeon: Boykin Nearing, MD;  Location: [facility] OR[facility];  Service: Gynecology;  Laterality: N/A;   LAPAROSCOPY N/A 02/16/2018   Procedure: LAPAROSCOPY OPERATIVE;  Surgeon: Schermerhorn, Gwen Her, MD;  Location: [facility] OR[facility];  Service: Gynecology;  Laterality: N/A;   LAPAROTOMY N/A 03/27/2022   Procedure: EXPLORATORY LAPAROTOMY;  Surgeon: Jules Husbands, MD;  Location: [facility] OR[facility];  Service: General;  Laterality: N/A;   LYSIS OF ADHESION N/A 03/18/2022   Procedure: LYSIS OF ADHESION;  Surgeon: Rubie Maid, MD;  Location: [facility];  Service: Gynecology;  Laterality: N/A;   XI ROBOTIC ASSISTED SALPINGECTOMY Left 03/18/2022   Procedure: XI ROBOTIC ASSISTED SALPINGECTOMY;  Surgeon: Rubie Maid, MD;  Location: [facility];  Service: Gynecology;  Laterality: Left;     Family History  Problem Relation Age of Onset   Mental illness Mother    Mental illness Father    Mental illness Maternal Grandmother    Mental illness Paternal Grandmother     Social History   Socioeconomic History   Marital status: Married    Spouse name: Thurmond Butts   Number of children: Not on file   Years of education: Not on file   Highest education level: Not on file  Occupational History   Not on file  Tobacco Use   Smoking status: Former    Packs/day: 0.75    Years: 14.00    Total pack years: 10.50    Types: Cigarettes    Quit date: 07/05/2016    Years since quitting: 5.7   Smokeless tobacco: Never  Vaping Use   Vaping Use: Former  Substance and Sexual Activity   Alcohol use: Not Currently    Alcohol/week: 11.0 standard drinks of alcohol    Types: 3 Glasses of wine, 2 Cans of beer, 3 Shots of liquor, 3 Standard drinks or equivalent  per week    Comment: almost daily   Drug use: No   Sexual activity: Yes    Birth control/protection: None  Other Topics Concern   Not on file  Social History Narrative   Not on file   Social Determinants of Health   Financial Resource Strain: Not on file  Food Insecurity: Not on file  Transportation Needs: Not on file  Physical Activity: Not on file  Stress: Not on file  Social Connections: Not on file  Intimate Partner Violence: Not on file    Review of Systems  Constitutional:  Negative for chills and fever.  Respiratory:  Positive for shortness of breath. Negative for cough and wheezing.   Cardiovascular:  Positive for orthopnea. Negative for chest pain, palpitations and PND.  Gastrointestinal:  Negative for abdominal pain, nausea and vomiting.  Musculoskeletal:  Positive for neck pain.        Objective    BP 118/72   Pulse (!) 105   Temp 97.8 F (36.6 C)   Resp 18   Ht '5\' 3"'$  (1.6 m)   Wt 190 lb 9.6 oz (86.5 kg)   LMP 03/21/2022   SpO2 97%   BMI 33.76 kg/m   Physical Exam Constitutional:       Appearance: Normal appearance.  HENT:     Head: Normocephalic and atraumatic.  Eyes:     Conjunctiva/sclera: Conjunctivae normal.  Neck:     Comments: Fullness in left sided cervical paraspinal muscles Cardiovascular:     Rate and Rhythm: Normal rate and regular rhythm.  Pulmonary:     Effort: Pulmonary effort is normal.     Breath sounds: Normal breath sounds. No wheezing, rhonchi or rales.  Abdominal:     Comments: Ostomy site pink, not inflamed   Musculoskeletal:     Cervical back: Normal range of motion. Tenderness present.     Right lower leg: No edema.     Left lower leg: No edema.  Skin:    General: Skin is warm and dry.  Neurological:     General: No focal deficit present.     Mental Status: She is alert. Mental status is at baseline.  Psychiatric:        Mood and Affect: Mood normal.        Behavior: Behavior normal.         Assessment & Plan:   1. Hospital discharge follow-up: Hospital discharge note reviewed along with all labs and imaging from recent hospitalization.  Medications reviewed.  She will continue to take ibuprofen 800 mg twice daily or as needed for pain, Flexeril 10 mg 3 times daily and Lyrica 200 mg as needed.  She is following with her general surgeon next week.  2. Anemia, unspecified type: Hemoglobin 8.0 on day of discharge, this is most likely a stress reaction.  Plan to recheck today.  - CBC w/Diff/Platelet  3. Hypertriglyceridemia: Check fasting labs today.  Already discussed the possibility of fish oil/Vascepa for hypertriglycerides, may require statin based on LDL.  - Lipid Profile  4. Weight gain: We will check TSH and A1c; did discuss how postoperatively is not the time to be adding a medication for weight loss.  Patient is agreeable.  Continue to monitor.  - COMPLETE METABOLIC PANEL WITH GFR - TSH - HgB A1c  5. Vitamin D deficiency: History of vitamin D deficiency.  Patient states she has vitamin D supplements 50,000 international  units to take weekly at home.  - Vitamin D (  25 hydroxy)  6. Need for hepatitis C screening test/Encounter for screening for HIV: Screening labs due today.  - Hepatitis C Antibody - HIV antibody (with reflex)  7. Neck fullness: Definitely had deep fullness underneath the left-sided cervical paraspinal muscles.  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  Continue muscle relaxers as needed for now.  - US Soft Tissue Head/Neck (NON-THYROID); Future  8. Orthopnea: Pulmonary exam in the office without abnormalities.  Patient only having symptoms when laying flat on her back.  This may be more related to sleep apnea.  We will continue to monitor and potentially get a sleep study in the future if symptoms persist.   Return in about 4 weeks (around 05/17/2022).   Teodora Medici, DO

## 2022-04-22 ENCOUNTER — Ambulatory Visit: Payer: Self-pay | Admitting: *Deleted

## 2022-04-22 ENCOUNTER — Emergency Department: Payer: No Typology Code available for payment source

## 2022-04-22 ENCOUNTER — Other Ambulatory Visit: Payer: Self-pay

## 2022-04-22 ENCOUNTER — Emergency Department
Admission: EM | Admit: 2022-04-22 | Discharge: 2022-04-22 | Disposition: A | Discharge status: Home / Self Care | Payer: No Typology Code available for payment source | Attending: Emergency Medicine | Admitting: Emergency Medicine

## 2022-04-22 ENCOUNTER — Other Ambulatory Visit (HOSPITAL_COMMUNITY): Payer: Self-pay | Admitting: Nurse Practitioner

## 2022-04-22 DIAGNOSIS — R0789 Other chest pain: Secondary | ICD-10-CM

## 2022-04-22 DIAGNOSIS — M546 Pain in thoracic spine: Secondary | ICD-10-CM | POA: Diagnosis not present

## 2022-04-22 DIAGNOSIS — L24B3 Irritant contact dermatitis related to fecal or urinary stoma or fistula: Secondary | ICD-10-CM

## 2022-04-22 DIAGNOSIS — K9400 Colostomy complication, unspecified: Secondary | ICD-10-CM

## 2022-04-22 DIAGNOSIS — R0602 Shortness of breath: Secondary | ICD-10-CM | POA: Insufficient documentation

## 2022-04-22 LAB — HEMOGLOBIN A1C
Hgb A1c MFr Bld: 4.9 % of total Hgb (ref <5.7)
Mean Plasma Glucose: 94 mg/dL
eAG (mmol/L): 5.2 mmol/L

## 2022-04-22 LAB — COMPLETE METABOLIC PANEL WITH GFR
AG Ratio: 1.4 (calc) (ref 1.0–2.5)
ALT: 46 U/L — ABNORMAL HIGH (ref 6–29)
AST: 41 U/L — ABNORMAL HIGH (ref 10–30)
Albumin: 4.2 g/dL (ref 3.6–5.1)
Alkaline phosphatase (APISO): 67 U/L (ref 31–125)
BUN: 11 mg/dL (ref 7–25)
CO2: 24 mmol/L (ref 20–32)
Calcium: 9.3 mg/dL (ref 8.6–10.2)
Chloride: 106 mmol/L (ref 98–110)
Creat: 0.74 mg/dL (ref 0.50–0.97)
Globulin: 2.9 g/dL (calc) (ref 1.9–3.7)
Glucose, Bld: 103 mg/dL — ABNORMAL HIGH (ref 65–99)
Potassium: 4.4 mmol/L (ref 3.5–5.3)
Sodium: 144 mmol/L (ref 135–146)
Total Bilirubin: 0.8 mg/dL (ref 0.2–1.2)
Total Protein: 7.1 g/dL (ref 6.1–8.1)
eGFR: 107 mL/min/{1.73_m2} (ref >=60)

## 2022-04-22 LAB — COMPREHENSIVE METABOLIC PANEL
ALT: 69 U/L — ABNORMAL HIGH (ref 0–44)
AST: 56 U/L — ABNORMAL HIGH (ref 15–41)
Albumin: 4.1 g/dL (ref 3.5–5.0)
Alkaline Phosphatase: 59 U/L (ref 38–126)
Anion gap: 7 (ref 5–15)
BUN: 12 mg/dL (ref 6–20)
CO2: 27 mmol/L (ref 22–32)
Calcium: 9.6 mg/dL (ref 8.9–10.3)
Chloride: 105 mmol/L (ref 98–111)
Creatinine, Ser: 0.67 mg/dL (ref 0.44–1.00)
GFR, Estimated: >60 mL/min (ref >60)
Glucose, Bld: 93 mg/dL (ref 70–99)
Potassium: 3.6 mmol/L (ref 3.5–5.1)
Sodium: 139 mmol/L (ref 135–145)
Total Bilirubin: 1.2 mg/dL (ref 0.3–1.2)
Total Protein: 7.8 g/dL (ref 6.5–8.1)

## 2022-04-22 LAB — CBC WITH DIFFERENTIAL/PLATELET
Abs Immature Granulocytes: 0.02 10*3/uL (ref 0.00–0.07)
Absolute Monocytes: 410 cells/uL (ref 200–950)
Basophils Absolute: 50 cells/uL (ref 0–200)
Basophils Absolute: 0 10*3/uL (ref 0.0–0.1)
Basophils Relative: 0.7 %
Basophils Relative: 1 %
Eosinophils Absolute: 230 cells/uL (ref 15–500)
Eosinophils Absolute: 0.2 10*3/uL (ref 0.0–0.5)
Eosinophils Relative: 3.2 %
Eosinophils Relative: 2 %
HCT: 34.7 % — ABNORMAL LOW (ref 35.0–45.0)
HCT: 37 % (ref 36.0–46.0)
Hemoglobin: 11.2 g/dL — ABNORMAL LOW (ref 11.7–15.5)
Hemoglobin: 12 g/dL (ref 12.0–15.0)
Immature Granulocytes: 0 %
Lymphocytes Relative: 37 %
Lymphs Abs: 1915 cells/uL (ref 850–3900)
Lymphs Abs: 2.3 10*3/uL (ref 0.7–4.0)
MCH: 27.3 pg (ref 27.0–33.0)
MCH: 27.9 pg (ref 26.0–34.0)
MCHC: 32.3 g/dL (ref 32.0–36.0)
MCHC: 32.4 g/dL (ref 30.0–36.0)
MCV: 84.4 fL (ref 80.0–100.0)
MCV: 86 fL (ref 80.0–100.0)
MPV: 11.3 fL (ref 7.5–12.5)
Monocytes Absolute: 0.4 10*3/uL (ref 0.1–1.0)
Monocytes Relative: 5.7 %
Monocytes Relative: 6 %
Neutro Abs: 4594 cells/uL (ref 1500–7800)
Neutro Abs: 3.5 10*3/uL (ref 1.7–7.7)
Neutrophils Relative %: 63.8 %
Neutrophils Relative %: 54 %
Platelets: 344 10*3/uL (ref 140–400)
Platelets: 314 10*3/uL (ref 150–400)
RBC: 4.11 10*6/uL (ref 3.80–5.10)
RBC: 4.3 MIL/uL (ref 3.87–5.11)
RDW: 13.2 % (ref 11.0–15.0)
RDW: 13.4 % (ref 11.5–15.5)
Total Lymphocyte: 26.6 %
WBC: 7.2 10*3/uL (ref 3.8–10.8)
WBC: 6.4 10*3/uL (ref 4.0–10.5)
nRBC: 0 % (ref 0.0–0.2)

## 2022-04-22 LAB — PREGNANCY, URINE: Preg Test, Ur: NEGATIVE

## 2022-04-22 LAB — VITAMIN D 25 HYDROXY (VIT D DEFICIENCY, FRACTURES): Vit D, 25-Hydroxy: 29 ng/mL — ABNORMAL LOW (ref 30–100)

## 2022-04-22 LAB — TROPONIN I (HIGH SENSITIVITY)
Troponin I (High Sensitivity): <2 ng/L (ref <18)
Troponin I (High Sensitivity): <2 ng/L (ref <18)

## 2022-04-22 LAB — D-DIMER, QUANTITATIVE: D-Dimer, Quant: 2.07 ug/mL-FEU — ABNORMAL HIGH (ref 0.00–0.50)

## 2022-04-22 LAB — TSH: TSH: 1.74 mIU/L

## 2022-04-22 LAB — LIPID PANEL
Cholesterol: 206 mg/dL — ABNORMAL HIGH (ref <200)
HDL: 29 mg/dL — ABNORMAL LOW (ref >=50)
LDL Cholesterol (Calc): 133 mg/dL (calc) — ABNORMAL HIGH
Non-HDL Cholesterol (Calc): 177 mg/dL (calc) — ABNORMAL HIGH (ref <130)
Total CHOL/HDL Ratio: 7.1 (calc) — ABNORMAL HIGH (ref <5.0)
Triglycerides: 310 mg/dL — ABNORMAL HIGH (ref <150)

## 2022-04-22 LAB — HEPATITIS C ANTIBODY: Hepatitis C Ab: NONREACTIVE

## 2022-04-22 LAB — HIV ANTIBODY (ROUTINE TESTING W REFLEX): HIV 1&2 Ab, 4th Generation: NONREACTIVE

## 2022-04-22 MED ORDER — IOHEXOL 350 MG/ML SOLN
75.0000 mL | Freq: Once | INTRAVENOUS | Status: AC | PRN
  Administered 2022-04-22: 75 mL via INTRAVENOUS

## 2022-04-22 NOTE — Discharge Instructions (Signed)
I will set you up with [employer] for pouch supplies.  Call as needed for pouching issues, concerns

## 2022-04-22 NOTE — Discharge Instructions (Signed)
Your doctor this week for follow-up appointment.  Continue to take your medications as prescribed by your doctor.  If you have any new or worsening symptoms, call your doctor right away or return to the emergency department.

## 2022-04-22 NOTE — Telephone Encounter (Signed)
Chief Complaint: SOB Symptoms: SOB at rest, mid upper back pain, worse with deep breath. Sweating last HS, "Probably had fever." Recent surgery. Frequency: Yesterday, worsening symptoms. Saw Dr. Rosana Berger 04/19/22, states discussed symptom then, worsening last night, 7/10. Presently SOB, tearful Pertinent Negatives: Patient denies fever this AM. (98.2) denies cough Disposition: '[x]'$ ED /'[]'$ Urgent Care (no appt availability in office) / '[]'$ Appointment(In office/virtual)/ '[]'$  [facility]/ '[]'$ Home Care/ '[]'$ Refused Recommended Disposition /'[]'$  Mobile Bus/ '[]'$  Follow-up with PCP Additional Notes: Pt initially declined ED, called practice for consult, Melissa. NT reiterated need for ED eval. Unsure pt will follow disposition. Tearful, "Had bad experience in ED.'  Reason for Disposition  Major surgery in the past month  Answer Assessment - Initial Assessment Questions 1. ONSET: "When did the pain begin?"      Yesterday 2. LOCATION: "Where does it hurt?" (upper, mid or lower back)     *No Answer* 3. SEVERITY: "How bad is the pain?"  (e.g., Scale 1-10; mild, moderate, or severe)   - MILD (1-3): Doesn't interfere with normal activities.    - MODERATE (4-7): Interferes with normal activities or awakens from sleep.    - SEVERE (8-10): Excruciating pain, unable to do any normal activities.      *No Answer* 4. PATTERN: "Is the pain constant?" (e.g., yes, no; constant, intermittent)      *No Answer* 5. RADIATION: "Does the pain shoot into your legs or somewhere else?"     *No Answer* 6. CAUSE:  "What do you think is causing the back pain?"      *No Answer* 7. BACK OVERUSE:  "Any recent lifting of heavy objects, strenuous work or exercise?"     *No Answer* 8. MEDICINES: "What have you taken so far for the pain?" (e.g., nothing, acetaminophen, NSAIDS)     *No Answer* 9. NEUROLOGIC SYMPTOMS: "Do you have any weakness, numbness, or problems with bowel/bladder control?"     *No Answer* 10.  OTHER SYMPTOMS: "Do you have any other symptoms?" (e.g., fever, abdomen pain, burning with urination, blood in urine)       *No Answer* 11. PREGNANCY: "Is there any chance you are pregnant?" "When was your last menstrual period?"       *No Answer*  Answer Assessment - Initial Assessment Questions 1. RESPIRATORY STATUS: "Describe your breathing?" (e.g., wheezing, shortness of breath, unable to speak, severe coughing)      *No Answer* 2. ONSET: "When did this breathing problem begin?"      *No Answer* 3. PATTERN "Does the difficult breathing come and go, or has it been constant since it started?"      *No Answer* 4. SEVERITY: "How bad is your breathing?" (e.g., mild, moderate, severe)    - MILD: No SOB at rest, mild SOB with walking, speaks normally in sentences, can lie down, no retractions, pulse < 100.    - MODERATE: SOB at rest, SOB with minimal exertion and prefers to sit, cannot lie down flat, speaks in phrases, mild retractions, audible wheezing, pulse 100-120.    - SEVERE: Very SOB at rest, speaks in single words, struggling to breathe, sitting hunched forward, retractions, pulse > 120      *No Answer* 5. RECURRENT SYMPTOM: "Have you had difficulty breathing before?" If Yes, ask: "When was the last time?" and "What happened that time?"      *No Answer* 6. CARDIAC HISTORY: "Do you have any history of heart disease?" (e.g., heart attack, angina, bypass surgery, angioplasty)      *  No Answer* 7. LUNG HISTORY: "Do you have any history of lung disease?"  (e.g., pulmonary embolus, asthma, emphysema)     *No Answer* 8. CAUSE: "What do you think is causing the breathing problem?"      *No Answer* 9. OTHER SYMPTOMS: "Do you have any other symptoms? (e.g., dizziness, runny nose, cough, chest pain, fever)     Back pain, at HS 7/10, presently 5/10 10. O2 SATURATION MONITOR:  "Do you use an oxygen saturation monitor (pulse oximeter) at home?" If Yes, ask: "What is your reading (oxygen level)  today?" "What is your usual oxygen saturation reading?" (e.g., 95%)       *No Answer* 11. PREGNANCY: "Is there any chance you are pregnant?" "When was your last menstrual period?"       *No Answer* 12. TRAVEL: "Have you traveled out of the country in the last month?" (e.g., travel history, exposures)       *No Answer*  Protocols used: Back Pain-A-AH, Breathing Difficulty-A-AH

## 2022-04-22 NOTE — ED Provider Notes (Signed)
[facility] Provider Note    Event Date/Time   First MD Initiated Contact with Patient 04/22/22 1548     (approximate)   History   Shortness of Breath   HPI  Lacey Harrison is a 36 y.o. female   Past medical history of recent hospitalization for extensive pelvic and abdominal surgeries with colectomy and colostomy now, has been discharged from the hospital for about 2 weeks and has been healing well, but has been experiencing progressive shortness of breath and chest pain, upper back pain for the last several days.  Dyspnea on exertion is now quite profound as she is not able to walk across the room.  She denies cough or fever.  Otherwise, her surgical wounds have been healing appropriately, no bleeding from the wound sites or colostomy bag.  History was obtained via patient and her mother who is at bedside.      Physical Exam   Triage Vital Signs: ED Triage Vitals [04/22/22 1401]  Enc Vitals Group     BP (!) 119/93     Pulse Rate 93     Resp 12     Temp 98.3 F (36.8 C)     Temp src      SpO2 99 %     Weight 190 lb (86.2 kg)     Height      Head Circumference      Peak Flow      Pain Score 5     Pain Loc      Pain Edu?      Excl. in [place]?     Most recent vital signs: Vitals:   04/22/22 1700 04/22/22 1730  BP: 117/79 108/83  Pulse: 84 85  Resp: 16 16  Temp:  98.3 F (36.8 C)  SpO2: 97% 97%    General: Awake, no distress.  After well-appearing in bed, pleasant and conversant.  Nontoxic appearance. CV:  Good peripheral perfusion.  Normal rate and rhythm. Resp:  Normal effort.  Lungs are clear to auscultation bilaterally no wheezing or focality noted. Abd:  No distention.  Soft and nontender, though mildly tender near the nuchal site in the midline abdomen which is bandaged and packed, clean dry and intact, no signs of infection, no active bleeding.  No rigidity.   ED Results / Procedures / Treatments   Labs (all labs ordered  are listed, but only abnormal results are displayed) Labs Reviewed  D-DIMER, QUANTITATIVE - Abnormal; Notable for the following components:      Result Value   D-Dimer, Quant 2.07 (*)    All other components within normal limits  COMPREHENSIVE METABOLIC PANEL - Abnormal; Notable for the following components:   AST 56 (*)    ALT 69 (*)    All other components within normal limits  CBC WITH DIFFERENTIAL/PLATELET  PREGNANCY, URINE  TROPONIN I (HIGH SENSITIVITY)  TROPONIN I (HIGH SENSITIVITY)     I reviewed labs and they are notable for elevated D-dimer at 2.07  EKG  ED ECG REPORT I, Lucillie Garfinkel, the attending physician, personally viewed and interpreted this ECG.   Date: 04/22/2022  EKG Time: 1409  Rate: 93  Rhythm: normal EKG, normal sinus rhythm  Axis: Normal  Intervals:none  ST&T Change: Negative for ischemia changes.    RADIOLOGY I dependently reviewed and interpreted chest x-ray and see no focal opacities or pneumothorax   PROCEDURES:  Critical Care performed: No  Procedures   MEDICATIONS ORDERED IN ED: Medications  iohexol (OMNIPAQUE) 350 MG/ML injection 75 mL (75 mLs Intravenous Contrast Given 04/22/22 1819)     IMPRESSION / MDM / ASSESSMENT AND PLAN / ED COURSE  I reviewed the triage vital signs and the nursing notes.                              Differential diagnosis includes, but is not limited to, symptoms and postsurgical timing concerning for pulmonary embolism.  Could be due to deconditioning as well.  Musculoskeletal strain.  Consider ACS, dissection though less likely.   MDM: This patient is several weeks postsurgery and now with chest pain and shortness of breath concerning for PE.  Dimer is elevated.  Needs a CT PE which was ordered at this time.  She is in no respiratory distress and is hemodynamically stable.  Could be anemia though no reports of blood loss. Consider other diagnoses like ACS, dissection, pneumonia though less likely given  symptoms not consistent though will assess the above with CT scan ordered as above, troponins.  EKG is nonischemic.  Other considerations musculoskeletal strain, deconditioning, electrolyte disturbance.  CT PE is negative.  Troponins negative x2.  Patient has been stable with normal hemodynamics in the emergency department for the duration of her stay.  I discussed with the patient these findings and considered other differential diagnoses to explain her symptoms including anemia, but hemoglobin is normal, postsurgical infection or other infection, but she denies symptoms of infection and has no white blood cell count elevation and afebrile.  Considered kidney stone but would be atypical given the location of her pain and tenderness to palpation.    Dispo: After careful consideration of this patient's presentation, medical and social risk factors, and evaluation in the emergency department I engaged in shared decision making with the patient and/or their representative to consider admission or observation and this patient was ultimately discharged because work-up above for emergent causes of her symptoms are negative, patient has been stable in the emergency department, will follow-up with her primary doctor and will return to the emergency department with any worsening..   Patient's presentation is most consistent with acute presentation with potential threat to life or bodily function.       FINAL CLINICAL IMPRESSION(S) / ED DIAGNOSES   Final diagnoses:  Shortness of breath  Chest wall pain     Rx / DC Orders   ED Discharge Orders     None        Note:  This document was prepared using Dragon voice recognition software and may include unintentional dictation errors.    Lucillie Garfinkel, MD 04/22/22 1910

## 2022-04-22 NOTE — ED Provider Triage Note (Signed)
Emergency Medicine Provider Triage Evaluation Note  Lacey Harrison , a 36 y.o. female  was evaluated in triage.  Pt complains of shortness of breath and occasional back pain. Was admitted for surgically perforated bowel and was dc'd on 8/18.      Review of Systems  Positive: + fever, rare cough Negative: No N/V  Physical Exam  BP (!) 119/93   Pulse 93   Temp 98.3 F (36.8 C)   Resp 12   Wt 86.2 kg   LMP 03/21/2022   SpO2 99%   BMI 33.66 kg/m  Gen:   Awake, no distress  Able to talk in complete sentences without noticeable SOB.   Resp:  Normal effort  Lungs Clear bilat.   MSK:   Moves extremities without difficulty  Other:    Medical Decision Making  Medically screening exam initiated at 2:06 PM.  Appropriate orders placed.  ARIEL DIMITRI was informed that the remainder of the evaluation will be completed by another provider, this initial triage assessment does not replace that evaluation, and the importance of remaining in the ED until their evaluation is complete.     Johnn Hai, PA-C 04/22/22 1419

## 2022-04-22 NOTE — ED Triage Notes (Signed)
PT coming from home POV for [facility]. Pt was recently hospitalized and had surgery for Bowel perf. Was dc'd 8/18. Pt endorsing shob for a few days and intermittent chest pain earlier this week. Pt describing pain in back with inhalation

## 2022-04-22 NOTE — ED Triage Notes (Signed)
C/O SOB since 8/25 and mid upper back pain.  Recent "long hospital stay".  Also c/o waking up in a 'pool of sweat' last night.  Patient is AAOx3.  Skin warm and dry.  No SOB/ DOE noted. NAD

## 2022-04-24 ENCOUNTER — Telehealth: Payer: Self-pay | Admitting: Obstetrics and Gynecology

## 2022-04-24 ENCOUNTER — Encounter: Payer: Self-pay | Admitting: Surgery

## 2022-04-24 ENCOUNTER — Ambulatory Visit (INDEPENDENT_AMBULATORY_CARE_PROVIDER_SITE_OTHER): Payer: No Typology Code available for payment source | Admitting: Surgery

## 2022-04-24 VITALS — BP 98/62 | HR 109 | Temp 98.4°F | Ht 63.0 in | Wt 193.0 lb

## 2022-04-24 DIAGNOSIS — K631 Perforation of intestine (nontraumatic): Secondary | ICD-10-CM

## 2022-04-24 DIAGNOSIS — T8143XD Infection following a procedure, organ and space surgical site, subsequent encounter: Secondary | ICD-10-CM

## 2022-04-24 DIAGNOSIS — Z09 Encounter for follow-up examination after completed treatment for conditions other than malignant neoplasm: Secondary | ICD-10-CM

## 2022-04-24 NOTE — Patient Instructions (Signed)
Follow up here in 3-4 weeks.   Continue to pack the area every day.   Please call and ask to speak with a nurse if you develop questions or concerns.

## 2022-04-24 NOTE — Progress Notes (Signed)
Lacey Harrison is 36 year old 4 weeks out after Hartman's for iatrogenic sigmoid injury.  She is doing much better.  She has weaned off her narcotics.  No fevers no chills taking p.o.  Some pain that is able to be managed with Lyrica and NSAIDs.  Ostomy is working.  She is doing daily wet-to-dry to open midline wound.  PE NAD Abd: soft, nt, open midline wound with beefy granulation tissue.  It is less than 5 mm in depth.  Ostomy is patent and intact.  A/P Doing very well considering prolonged hospitalization Encourage resuming baseline activities RTC 3-4 weeks w Mr. Donia Ast . I can see her In 2 months or so. We will consider takedown at 5-6 months mark and likely do CT images prior  to surgery

## 2022-04-24 NOTE — Telephone Encounter (Signed)
Called patient to schedule ER follow up appointment. Pt states she was on the way to a post op appointment with her surgeon and she would give a call back to schedule appointment.

## 2022-05-02 ENCOUNTER — Telehealth: Payer: Self-pay | Admitting: *Deleted

## 2022-05-02 NOTE — Telephone Encounter (Signed)
Faxed extension FMLA to [employer] at [phone]

## 2022-05-03 ENCOUNTER — Ambulatory Visit
Admission: RE | Admit: 2022-05-03 | Discharge: 2022-05-03 | Disposition: A | Discharge status: Home / Self Care | Payer: No Typology Code available for payment source | Source: Ambulatory Visit | Attending: Internal Medicine | Admitting: Internal Medicine

## 2022-05-03 DIAGNOSIS — R221 Localized swelling, mass and lump, neck: Secondary | ICD-10-CM | POA: Insufficient documentation

## 2022-05-13 ENCOUNTER — Encounter: Payer: Self-pay | Admitting: Internal Medicine

## 2022-05-13 ENCOUNTER — Ambulatory Visit: Payer: No Typology Code available for payment source | Admitting: Internal Medicine

## 2022-05-13 VITALS — BP 122/68 | HR 118 | Temp 98.0°F | Resp 18 | Ht 63.0 in | Wt 189.9 lb

## 2022-05-13 DIAGNOSIS — R221 Localized swelling, mass and lump, neck: Secondary | ICD-10-CM

## 2022-05-13 DIAGNOSIS — F5104 Psychophysiologic insomnia: Secondary | ICD-10-CM | POA: Diagnosis not present

## 2022-05-13 DIAGNOSIS — F32A Depression, unspecified: Secondary | ICD-10-CM

## 2022-05-13 DIAGNOSIS — Z23 Encounter for immunization: Secondary | ICD-10-CM

## 2022-05-13 DIAGNOSIS — S2341XS Sprain of ribs, sequela: Secondary | ICD-10-CM

## 2022-05-13 DIAGNOSIS — D170 Benign lipomatous neoplasm of skin and subcutaneous tissue of head, face and neck: Secondary | ICD-10-CM

## 2022-05-13 NOTE — Progress Notes (Addendum)
Established Patient Office Visit  Subjective    Patient ID: Lacey Harrison, female    DOB: 1985-11-15  Age: 36 y.o. MRN: [id number]  CC:  Chief Complaint  Patient presents with   Follow-up    HPI Lacey Harrison presents for follow up. Chronic medical conditions include weight gain, depression, however was recently inpatient for several weeks due to complications from surgery.  On 03/18/2022 she underwent robotic adhesiolysis, left salpingectomy, with drainage of 500 mL from large left peritoneal cyst and chromotubation and was discharged home the same day without complication.  She then returned to the emergency room on 7/27 after experiencing sudden acute abdominal pain.  CT at that time showed intra-abdominal abscess with leukocytosis.  She was started on IV antibiotics and eventually went into septic shock.  She required PICC and TPN.  On 8/30 she underwent exploratory laparotomy and subsequent sigmoid colectomy with colostomy creation, Hartmann procedure and lysis of adhesions.  She continued to improve with IV antibiotics and was discharged home on 04/10/2022.  She is no longer on any medication for pain and her pain is well controlled.  She endorses appropriate stool passage with ostomy.  She is eating appropriately.  She is passing urine appropriately.  She recently followed up with her surgeon on 04/24/22, planning for potential take down in 5-6 months.   Shortness of breath/rib pain: -Presented to the ER 04/22/2022 complaining of shortness of breath.  CTA was negative for PE, however there were signs of scattered bandlike opacities consistent with atelectasis -Patient states she no longer feels short of breath but does have pain at the bottom of her right ribs with deep inhalation -She describes it as a quick catching like pain that is progressively improving but still present  Left cervical soft tissue mass: -She has had left sided neck pain and muscle spasms for several years.  She  notes a fullness in the paraspinal muscles of the left side of her neck.  This makes it difficult for her to sleep.  She had tried PT in the past, which did help minimally.  -US of the soft tissues on 05/03/22 showing well circumscribed, mildly hypoechoic ovoid mass most consistent with lipoma. Consider MRI.  Patient understandably is not eager to repeat surgical intervention, however she states the mass is painful to her despite physical therapy and muscle relaxers as well as anti-inflammatories.  MDD/Anxiety: -Mood status:  Fluctuating -Current treatment: Nothing Psychotherapy/counseling: no  Depressed mood: yes Anxious mood: yes Anhedonia: yes Insomnia: yes hard to fall asleep Fatigue: yes     05/13/2022    2:32 PM 04/19/2022    9:16 AM 11/21/2020    1:27 PM 02/02/2019    3:00 PM  Depression screen PHQ 2/9  Decreased Interest 3 0 3 0  Down, Depressed, Hopeless 1 0 2 1  PHQ - 2 Score 4 0 5 1  Altered sleeping 3 0 3   Tired, decreased energy 3 0 3   Change in appetite 1 0 3   Feeling bad or failure about yourself  0 0 3   Trouble concentrating 3 0 3   Moving slowly or fidgety/restless 0 0 0   Suicidal thoughts 0 0 0   PHQ-9 Score 14 0 20   Difficult doing work/chores Extremely dIfficult Not difficult at all Very difficult      Outpatient Encounter Medications as of 05/13/2022  Medication Sig   ibuprofen (ADVIL) 800 MG tablet Take 1 tablet (800 mg total) by  mouth every 8 (eight) hours as needed.   Vitamin D, Ergocalciferol, (DRISDOL) 1.25 MG (50000 UNIT) CAPS capsule Take 50,000 Units by mouth every 7 (seven) days.   [DISCONTINUED] cyclobenzaprine (FLEXERIL) 10 MG tablet Take 1 tablet (10 mg total) by mouth 3 (three) times daily as needed for muscle spasms.   [DISCONTINUED] pregabalin (LYRICA) 200 MG capsule Take 1 capsule (200 mg total) by mouth 3 (three) times daily.   No facility-administered encounter medications on file as of 05/13/2022.    Past Medical History:  Diagnosis  Date   Allergy    Anxiety    Chicken pox    Colostomy in place [facility])    Depression    Heart murmur    Lyme disease    patient reports a remote history of lyme and alludes that she has "chronic lyme"   Migraines    MIGRAINES   Pneumonia 07/2017    Past Surgical History:  Procedure Laterality Date   CHROMOPERTUBATION  02/16/2018   Procedure: CHROMOPERTUBATION;  Surgeon: Schermerhorn, Gwen Her, MD;  Location: [facility] ORS;  Service: Gynecology;;   CHROMOPERTUBATION Left 03/18/2022   Procedure: CHROMOPERTUBATION;  Surgeon: Rubie Maid, MD;  Location: [facility] ORS;  Service: Gynecology;  Laterality: Left;   COLECTOMY WITH COLOSTOMY CREATION/HARTMANN PROCEDURE N/A 03/27/2022   Procedure: SIGMOID COLECTOMY WITH COLOSTOMY CREATION/HARTMANN PROCEDURE; LYSIS OF ADHESIONS;  Surgeon: Jules Husbands, MD;  Location: [facility] ORS;  Service: General;  Laterality: N/A;   CYSTECTOMY Left    OVARY   DILATION AND CURETTAGE OF UTERUS  2008   LAPAROSCOPIC LYSIS OF ADHESIONS N/A 02/16/2018   Procedure: EXTENSIVE LAPAROSCOPIC LYSIS OF ADHESIONS;  Surgeon: Boykin Nearing, MD;  Location: [facility] ORS;  Service: Gynecology;  Laterality: N/A;   LAPAROSCOPY N/A 02/16/2018   Procedure: LAPAROSCOPY OPERATIVE;  Surgeon: Schermerhorn, Gwen Her, MD;  Location: [facility] ORS;  Service: Gynecology;  Laterality: N/A;   LAPAROTOMY N/A 03/27/2022   Procedure: EXPLORATORY LAPAROTOMY;  Surgeon: Jules Husbands, MD;  Location: [facility] ORS;  Service: General;  Laterality: N/A;   LYSIS OF ADHESION N/A 03/18/2022   Procedure: LYSIS OF ADHESION;  Surgeon: Rubie Maid, MD;  Location: [facility] ORS;  Service: Gynecology;  Laterality: N/A;   XI ROBOTIC ASSISTED SALPINGECTOMY Left 03/18/2022   Procedure: XI ROBOTIC ASSISTED SALPINGECTOMY;  Surgeon: Rubie Maid, MD;  Location: [facility] ORS;  Service: Gynecology;  Laterality: Left;    Family History  Problem Relation Age of Onset   Mental illness Mother    Mental illness Father    Mental illness  Maternal Grandmother    Mental illness Paternal Grandmother     Social History   Socioeconomic History   Marital status: Married    Spouse name: Thurmond Butts   Number of children: Not on file   Years of education: Not on file   Highest education level: Not on file  Occupational History   Not on file  Tobacco Use   Smoking status: Former    Packs/day: 0.75    Years: 14.00    Total pack years: 10.50    Types: Cigarettes    Quit date: 07/05/2016    Years since quitting: 5.8   Smokeless tobacco: Never  Vaping Use   Vaping Use: Former  Substance and Sexual Activity   Alcohol use: Not Currently    Alcohol/week: 11.0 standard drinks of alcohol    Types: 3 Glasses of wine, 2 Cans of beer, 3 Shots of liquor, 3 Standard drinks or equivalent per week    Comment:  almost daily   Drug use: No   Sexual activity: Yes    Birth control/protection: None  Other Topics Concern   Not on file  Social History Narrative   Not on file   Social Determinants of Health   Financial Resource Strain: Not on file  Food Insecurity: Not on file  Transportation Needs: Not on file  Physical Activity: Not on file  Stress: Not on file  Social Connections: Not on file  Intimate Partner Violence: Not on file    Review of Systems  Constitutional:  Negative for chills and fever.  Respiratory:  Negative for cough, shortness of breath and wheezing.   Cardiovascular:  Positive for orthopnea. Negative for chest pain, palpitations and PND.  Gastrointestinal:  Negative for abdominal pain, nausea and vomiting.  Musculoskeletal:  Positive for neck pain.        Objective    BP 122/68   Pulse (!) 118   Temp 98 F (36.7 C)   Resp 18   Ht '5\' 3"'$  (1.6 m)   Wt 189 lb 14.2 oz (86.1 kg)   LMP 03/21/2022   SpO2 98%   BMI 33.64 kg/m   Physical Exam Constitutional:      Appearance: Normal appearance.  HENT:     Head: Normocephalic and atraumatic.  Eyes:     Conjunctiva/sclera: Conjunctivae normal.  Neck:      Comments: Restricted range of motion in cervical spine present, however 4x4 cm soft deep mass causing pain with movement.  Cardiovascular:     Rate and Rhythm: Normal rate and regular rhythm.  Pulmonary:     Effort: Pulmonary effort is normal.     Breath sounds: Normal breath sounds. No wheezing, rhonchi or rales.  Skin:    General: Skin is warm and dry.  Neurological:     General: No focal deficit present.     Mental Status: She is alert. Mental status is at baseline.  Psychiatric:        Mood and Affect: Mood normal.        Behavior: Behavior normal.       Assessment & Plan:   1. Lipoma of neck/ Neck fullness: Ultrasound showing soft tissue mass measuring 4.3 x 2.5 x 4.4 cm causing pain and reduced cervical range of motion, however the patient is not ready to talk about cervical interventions.  We will go ahead and pursue MRI for more details and to confirm diagnosis of lipoma prior to referral to surgery.  The patient does have a history of lipomas causing surgical removal in the past.  - MR NECK SOFT TISSUE ONLY WO CONTRAST; Future  2. Sprain of costal cartilage, sequela: Reviewed most recent CTA showing atelectasis, patient having pain on the right side with deep inhalation consistent with intercostal strain.  Recommend moist heat, anti-inflammatories as needed and time to heal.  3. Psychophysiological insomnia/Depression, unspecified depression type: Depression screening high today, patient having PTSD-like symptoms from her recent hospitalization.  The patient is not interested in starting a daily medication for depression and anxiety and feels like her insomnia is with bothering her the most currently.  Discussed starting trazodone, patient thinks she may have been on this in the past and did not tolerate it well.  Patient does not want controlled substances for sleep, recommend over-the-counter melatonin starting at 3 mg to see if this helps her with her sleep.  Follow-up in 2  months to recheck to discuss starting SSRI.  4. Need for influenza vaccination:  Flu vaccine administered today.  - Flu Vaccine QUAD 6+ mos PF IM (Fluarix Quad PF)  Return in about 2 months (around 07/13/2022).   Teodora Medici, DO

## 2022-05-13 NOTE — Patient Instructions (Addendum)
It was great seeing you today!  Plan discussed at today's visit: -MRI of neck ordered please call to setup date and time at [phone]. -Start Melatonin 3 mg over the counter 30 minutes prior to bedtime -Let me know if you are interested in depression/anxiety medication -Flu vaccine today  Follow up in: 2 months   Take care and let us know if you have any questions or concerns prior to your next visit.  Dr. Rosana Berger

## 2022-05-15 ENCOUNTER — Telehealth: Payer: Self-pay | Admitting: Obstetrics and Gynecology

## 2022-05-15 NOTE — Telephone Encounter (Signed)
Called patient to get her scheduled for an ER follow up. No answer left VM for patient to contact the office for scheduling.

## 2022-05-16 ENCOUNTER — Other Ambulatory Visit: Payer: Self-pay

## 2022-05-16 ENCOUNTER — Ambulatory Visit (INDEPENDENT_AMBULATORY_CARE_PROVIDER_SITE_OTHER): Payer: No Typology Code available for payment source | Admitting: Physician Assistant

## 2022-05-16 ENCOUNTER — Encounter: Payer: Self-pay | Admitting: Physician Assistant

## 2022-05-16 VITALS — BP 131/81 | HR 90 | Temp 98.3°F | Ht 63.0 in | Wt 191.0 lb

## 2022-05-16 DIAGNOSIS — Z09 Encounter for follow-up examination after completed treatment for conditions other than malignant neoplasm: Secondary | ICD-10-CM

## 2022-05-16 DIAGNOSIS — K9400 Colostomy complication, unspecified: Secondary | ICD-10-CM

## 2022-05-16 DIAGNOSIS — T8143XD Infection following a procedure, organ and space surgical site, subsequent encounter: Secondary | ICD-10-CM

## 2022-05-16 DIAGNOSIS — K631 Perforation of intestine (nontraumatic): Secondary | ICD-10-CM

## 2022-05-16 NOTE — Progress Notes (Signed)
[facility] POST-OP OFFICE VISIT  05/16/2022  HPI: Lacey Harrison is a 36 y.o. female ~7 weeks s/p exploratory laparotomy and Hartman's for iatrogenic sigmoid injury with Dr Dahlia Byes.   She is doing remarkably well given the circumstances Intermittent abdominal soreness but this is very mild No fever, chills, nausea, emesis Ostomy is functioning well and she feels very comfortable with this Incision is closed She is tolerating PO Ambulating well No new complaints   Vital signs: BP 131/81   Pulse 90   Temp 98.3 F (36.8 C) (Oral)   Ht '5\' 3"'$  (1.6 m)   Wt 191 lb (86.6 kg)   LMP 03/21/2022   SpO2 98%   BMI 33.83 kg/m    Physical Exam: Constitutional: Well appearing female, NAD Abdomen: Soft, non-tender, non-distended, no rebound/guarding; ostomy in left abdomen is pink and patent Skin: Laparotomy incision is now completely closed; no erythema  Assessment/Plan: This is a 36 y.o. female ~7 weeks s/p exploratory laparotomy and Hartman's for iatrogenic sigmoid injury with Dr Dahlia Byes   - Reviewed wound care recommendation; nothing further okay to be open to air  - She has completed lifting restrictions  - I will have her see Dr Dahlia Byes in ~2-3 months to begin the takedown process; She understands to call with questions/concerns in the interim.   -- [name], PA-C [facility] 05/16/2022, 2:05 PM M-F: 7am - 4pm

## 2022-05-16 NOTE — Patient Instructions (Signed)
Please see your follow up appointment listed below.  °

## 2022-05-17 ENCOUNTER — Telehealth: Payer: Self-pay | Admitting: Internal Medicine

## 2022-05-17 NOTE — Telephone Encounter (Signed)
Caller states pts insurance has denied pts mri scheduled w/ [place] [facility] on 9-25  Please assist further  Phone [phone] ext  [phone]

## 2022-05-19 ENCOUNTER — Encounter: Payer: Self-pay | Admitting: Internal Medicine

## 2022-05-20 ENCOUNTER — Ambulatory Visit: Payer: No Typology Code available for payment source

## 2022-05-21 ENCOUNTER — Other Ambulatory Visit: Payer: Self-pay | Admitting: Physician Assistant

## 2022-05-21 MED ORDER — PREGABALIN 200 MG PO CAPS: 200.0000 mg | ORAL_CAPSULE | Freq: Three times a day (TID) | ORAL | 0 refills | Status: DC

## 2022-06-12 ENCOUNTER — Other Ambulatory Visit: Payer: Self-pay | Admitting: Internal Medicine

## 2022-06-12 DIAGNOSIS — R221 Localized swelling, mass and lump, neck: Secondary | ICD-10-CM

## 2022-06-18 ENCOUNTER — Telehealth: Payer: Self-pay | Admitting: *Deleted

## 2022-06-18 NOTE — Telephone Encounter (Signed)
Patient called and would like to get a work note that she was okay to return to work on 06/10/22. She would like that to go to her MyChart. Patient had surgery on 03/27/22 Dr Dahlia Byes sigmoid colectomy with colostomy creation/hartmann procedure.

## 2022-06-19 ENCOUNTER — Ambulatory Visit
Admission: RE | Admit: 2022-06-19 | Discharge: 2022-06-19 | Disposition: A | Discharge status: Home / Self Care | Payer: No Typology Code available for payment source | Source: Ambulatory Visit | Attending: Internal Medicine | Admitting: Internal Medicine

## 2022-06-19 DIAGNOSIS — R221 Localized swelling, mass and lump, neck: Secondary | ICD-10-CM | POA: Insufficient documentation

## 2022-06-19 MED ORDER — GADOBUTROL 1 MMOL/ML IV SOLN
8.0000 mL | Freq: Once | INTRAVENOUS | Status: AC | PRN
  Administered 2022-06-19: 8 mL via INTRAVENOUS

## 2022-07-03 ENCOUNTER — Ambulatory Visit: Payer: No Typology Code available for payment source | Admitting: Surgery

## 2022-07-03 ENCOUNTER — Telehealth: Payer: Self-pay | Admitting: Surgery

## 2022-07-03 ENCOUNTER — Encounter: Payer: Self-pay | Admitting: Surgery

## 2022-07-03 VITALS — BP 116/77 | HR 103 | Temp 98.5°F | Wt 195.4 lb

## 2022-07-03 DIAGNOSIS — L659 Nonscarring hair loss, unspecified: Secondary | ICD-10-CM

## 2022-07-03 DIAGNOSIS — R221 Localized swelling, mass and lump, neck: Secondary | ICD-10-CM

## 2022-07-03 DIAGNOSIS — T8143XA Infection following a procedure, organ and space surgical site, initial encounter: Secondary | ICD-10-CM

## 2022-07-03 MED ORDER — ZINC GLUCONATE 50 MG PO TABS: 50.0000 mg | ORAL_TABLET | Freq: Every day | ORAL | 1 refills | Status: DC

## 2022-07-03 NOTE — H&P (View-Only) (Signed)
Patient ID: Lacey Harrison, female   DOB: March 01, 1986, 36 y.o.   MRN: [id number]  HPI Lacey Harrison is a 36 y.o. female seen in consultation at the request of Dr. Rosana Berger for a large soft tissue neck mass.  She reports that she has had this for couple of years and over the last couple months have become symptomatic.  She experiences intermittent pain on the left side that is told moderate intensity.  Sometimes associated with certain movements.  She also reports that she has to sleep with a collar.  She experienced some tingling on the left fingers and some numbness.  Has tried physical therapy and muscle relaxers without any help. Slowly had an ultrasound showing evidence of a 4.5 cm deep soft tissue mass and this is followed by MRI.  Please note that I have personally review both imaging studies.  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 Hartman's procedure she has recovered well.  She experiences some bulging and she is unsure whether or not this is a hernia.  She continues to have good ostomy output.  No abdominal pain.  She is back to work.  She denies any fevers any chills or any other symptoms.\  Does report some alopecia and has started some multivitamins.  HPI  Past Medical History:  Diagnosis Date   Allergy    Anxiety    Chicken pox    Colostomy in place [facility])    Depression    Heart murmur    Lyme disease    patient reports a remote history of lyme and alludes that she has "chronic lyme"   Migraines    MIGRAINES   Pneumonia 07/2017    Past Surgical History:  Procedure Laterality Date   CHROMOPERTUBATION  02/16/2018   Procedure: CHROMOPERTUBATION;  Surgeon: Schermerhorn, Gwen Her, MD;  Location: [facility];  Service: Gynecology;;   CHROMOPERTUBATION Left 03/18/2022   Procedure: CHROMOPERTUBATION;  Surgeon: Rubie Maid, MD;  Location: [facility];  Service: Gynecology;  Laterality: Left;   COLECTOMY WITH COLOSTOMY  CREATION/HARTMANN PROCEDURE N/A 03/27/2022   Procedure: SIGMOID COLECTOMY WITH COLOSTOMY CREATION/HARTMANN PROCEDURE; LYSIS OF ADHESIONS;  Surgeon: Jules Husbands, MD;  Location: [facility];  Service: General;  Laterality: N/A;   CYSTECTOMY Left    OVARY   DILATION AND CURETTAGE OF UTERUS  2008   LAPAROSCOPIC LYSIS OF ADHESIONS N/A 02/16/2018   Procedure: EXTENSIVE LAPAROSCOPIC LYSIS OF ADHESIONS;  Surgeon: Boykin Nearing, MD;  Location: [facility];  Service: Gynecology;  Laterality: N/A;   LAPAROSCOPY N/A 02/16/2018   Procedure: LAPAROSCOPY OPERATIVE;  Surgeon: Schermerhorn, Gwen Her, MD;  Location: [facility];  Service: Gynecology;  Laterality: N/A;   LAPAROTOMY N/A 03/27/2022   Procedure: EXPLORATORY LAPAROTOMY;  Surgeon: Jules Husbands, MD;  Location: [facility];  Service: General;  Laterality: N/A;   LYSIS OF ADHESION N/A 03/18/2022   Procedure: LYSIS OF ADHESION;  Surgeon: Rubie Maid, MD;  Location: [facility];  Service: Gynecology;  Laterality: N/A;   XI ROBOTIC ASSISTED SALPINGECTOMY Left 03/18/2022   Procedure: XI ROBOTIC ASSISTED SALPINGECTOMY;  Surgeon: Rubie Maid, MD;  Location: [facility];  Service: Gynecology;  Laterality: Left;    Family History  Problem Relation Age of Onset   Mental illness Mother    Mental illness Father    Mental illness Maternal Grandmother    Mental illness Paternal Grandmother     Social History Social History   Tobacco Use  Smoking status: Former    Packs/day: 0.75    Years: 14.00    Total pack years: 10.50    Types: Cigarettes    Quit date: 07/05/2016    Years since quitting: 5.9   Smokeless tobacco: Never  Vaping Use   Vaping Use: Former  Substance Use Topics   Alcohol use: Not Currently    Alcohol/week: 11.0 standard drinks of alcohol    Types: 3 Glasses of wine, 2 Cans of beer, 3 Shots of liquor, 3 Standard drinks or equivalent per week    Comment: almost daily   Drug use: No    Allergies  Allergen Reactions   Tylenol  [Acetaminophen] Other (See Comments)    Lingering Taste    Current Outpatient Medications  Medication Sig Dispense Refill   Vitamin D, Ergocalciferol, (DRISDOL) 1.25 MG (50000 UNIT) CAPS capsule Take 50,000 Units by mouth every 7 (seven) days.     zinc gluconate 50 MG tablet Take 1 tablet (50 mg total) by mouth daily. 30 tablet 1   No current facility-administered medications for this visit.     Review of Systems Full ROS  was asked and was negative except for the information on the HPI  Physical Exam Blood pressure 116/77, pulse (!) 103, temperature 98.5 F (36.9 C), temperature source Oral, weight 195 lb 6.4 oz (88.6 kg), SpO2 98 %. CONSTITUTIONAL: NAD. EYES: Pupils are equal, round, and reactive to light, Sclera are non-icteric. EARS, NOSE, MOUTH AND THROAT: The oropharynx is clear. The oral mucosa is pink and moist. Hearing is intact to voice. NECK: Left posterior neck mass 5 cms, deep, mobile , mildly tender to palpation located on level [facility]Marland Kitchen LYMPH NODES:  Lymph nodes in the neck are normal. RESPIRATORY:  Lungs are clear. There is normal respiratory effort, with equal breath sounds bilaterally, and without pathologic use of accessory muscles. CARDIOVASCULAR: Heart is regular without murmurs, gallops, or rubs. GI: The abdomen is  soft, nontender, and nondistended.  Midline laparotomy scar healed and ostomy working, no evidence of hernias are apparent but exam is limited de to pt discomfort.There are no palpable masses. There is no hepatosplenomegaly. There are normal bowel sounds . GU: Rectal deferred.   MUSCULOSKELETAL: Normal muscle strength and tone. No cyanosis or edema.   SKIN: Turgor is good and there are no pathologic skin lesions or ulcers. NEUROLOGIC: Motor and sensation is grossly normal. Cranial nerves are grossly intact. PSYCH:  Oriented to person, place and time. Affect is normal.  Data Reviewed  I have personally reviewed the patient's imaging, laboratory  findings and medical records.    Assessment/Plan  36 year old female well-known to me now with a symptomatic soft tissue mass on posterior left neck.  Discussed with the patient in detail and I do recommend excision given symptoms and also patient wishes to have this done. This is subfascial and deep within the neck we will need to be doing this under general endotracheal anesthesia and placed patient in a prone position.  Procedure discussed with her in detail.  Risks, benefits and possible complications including but not limited to: Bleeding, infection spinal accessory nerve injury, chronic pain.  She understands and wished to proceed. He also has alopecia and we will obtain zinc level and empirically start zinc gluconate.  We will make a full ropy a referral for dermatology. Regarding her potential abdominal bulging we will obtain a CT scan of the abdomen pelvis to ensure there is no other hernias or intra-abdominal pathology. Note that I  spent greater than 45 minutes in this encounter including personally reviewing imaging studies, coordinating her care, placing orders, counseling the patient and performing appropriate documentation. A copy of this report was sent to the referring provider [id number]  Caroleen Hamman, MD [facility] 07/03/2022, 9:37 AM

## 2022-07-03 NOTE — Telephone Encounter (Signed)
Patient has been advised of Pre-Admission date/time, and Surgery date at [facility].  Surgery Date: 07/25/22 Preadmission Testing Date: 07/22/22 (phone 1p-5p)  Patient has been made aware to call [phone], between 1-3:00pm the day before surgery, to find out what time to arrive for surgery.

## 2022-07-03 NOTE — Patient Instructions (Addendum)
Your CT is scheduled for 08/05/2022 at 11 am (arrive by 9 am to drink contrast) @ [facility]. Nothing to eat or drink 4 hours prior.   Please go to get your labs drawn at [facility]. You do not need an appointment.   Our surgery scheduler Pamala Hurry will call you within 24-48 hours to get you scheduled. If you have not heard from her after 48 hours, please call our office. Have the blue sheet available when she calls to write down important information.   If you have any concerns or questions, please feel free to call our office.   Lipoma Removal  Lipoma removal is a surgical procedure to remove a lipoma, which is a noncancerous (benign) tumor that is made up of fat cells. Most lipomas are small and painless and do not require treatment. They can form in many areas of the body but are most common under the skin of the back, arms, shoulders, buttocks, and thighs. You may need lipoma removal if you have a lipoma that is large, growing, or causing discomfort. Lipoma removal may also be done for cosmetic reasons. Tell a health care provider about: Any allergies you have. All medicines you are taking, including vitamins, herbs, eye drops, creams, and over-the-counter medicines. Any problems you or family members have had with anesthetic medicines. Any bleeding problems you have. Any surgeries you have had. Any medical conditions you have. Whether you are pregnant or may be pregnant. What are the risks? Generally, this is a safe procedure. However, problems may occur, including: Infection. Bleeding. Scarring. Allergic reactions to medicines. Damage to nearby structures or organs, such as damage to nerves or blood vessels near the lipoma. What happens before the procedure? When to Stop Eating and Drinking Follow instructions from your health care provider about what you may eat and drink before your procedure. These may include: 8 hours before your procedure Stop eating most  foods. Do not eat meat, fried foods, or fatty foods. Eat only light foods, such as toast or crackers. All liquids are okay except energy drinks and alcohol. 6 hours before your procedure Stop eating. Drink only clear liquids, such as water, clear fruit juice, black coffee, plain tea, and sports drinks. Do not drink energy drinks or alcohol. 2 hours before your procedure Stop drinking all liquids. You may be allowed to take medicines with small sips of water. If you do not follow your health care provider's instructions, your procedure may be delayed or canceled. Medicines Ask your health care provider about: Changing or stopping your regular medicines. This is especially important if you are taking diabetes medicines or blood thinners. Taking medicines such as aspirin and ibuprofen. These medicines can thin your blood. Do not take these medicines unless your health care provider tells you to take them. Taking over-the-counter medicines, vitamins, herbs, and supplements. General instructions You will have a physical exam. Your health care provider will check the size of the lipoma and whether it can be removed easily. You may have a biopsy and imaging tests, such as X-rays, a CT scan, and an MRI. Do not use any products that contain nicotine or tobacco for at least 4 weeks before the procedure. These products include cigarettes, chewing tobacco, and vaping devices, such as e-cigarettes. If you need help quitting, ask your health care provider. Ask your health care provider: How your surgery site will be marked. What steps will be taken to help prevent infection. These may include: Washing skin with  a germ-killing soap. Taking antibiotic medicine. If you will be going home right after the procedure, plan to have a responsible adult: Take you home from the hospital or clinic. You will not be allowed to drive. Care for you for the time you are told. What happens during the procedure?  An IV  will be inserted into one of your veins. You will be given one or more of the following: A medicine to help you relax (sedative). A medicine to numb the area (local anesthetic). A medicine to make you fall asleep (general anesthetic). A medicine that is injected into an area of your body to numb everything below the injection site (regional anesthetic). An incision will be made into the skin over the lipoma or very near the lipoma. The incision may be made in a natural skin line or crease. Tissues, nerves, and blood vessels near the lipoma will be moved out of the way. The lipoma and the capsule that surrounds it will be separated from the surrounding tissues. The lipoma will be removed. The incision may be closed with stitches (sutures). A bandage (dressing) will be placed over the incision. The procedure may vary among health care providers and hospitals. What happens after the procedure? Your blood pressure, heart rate, breathing rate, and blood oxygen level will be monitored until you leave the hospital or clinic. If you were prescribed an antibiotic medicine, use it as told by your health care provider. Do not stop using the antibiotic even if you start to feel better. If you were given a sedative during the procedure, it can affect you for several hours. Do not drive or operate machinery until your health care provider says that it is safe. Where to find more information OrthoInfo: orthoinfo.aaos.org Summary Before the procedure, follow instructions from your health care provider about eating and drinking, and changing or stopping your regular medicines. This is especially important if you are taking diabetes medicines or blood thinners. After the lipoma is removed, the incision may be closed with stitches (sutures) and covered with a bandage (dressing). If you were given a sedative during the procedure, it can affect you for several hours. Do not drive or operate machinery until your  health care provider says that it is safe. This information is not intended to replace advice given to you by your health care provider. Make sure you discuss any questions you have with your health care provider. Document Revised: 08/31/2021 Document Reviewed: 08/31/2021 Elsevier Patient Education  [place].

## 2022-07-03 NOTE — Progress Notes (Signed)
Patient ID: ZUMA HUST, female   DOB: July 09, 1986, 37 y.o.   MRN: [id number]  HPI Lacey Harrison is a 36 y.o. female seen in consultation at the request of Dr. Rosana Berger for a large soft tissue neck mass.  She reports that she has had this for couple of years and over the last couple months have become symptomatic.  She experiences intermittent pain on the left side that is told moderate intensity.  Sometimes associated with certain movements.  She also reports that she has to sleep with a collar.  She experienced some tingling on the left fingers and some numbness.  Has tried physical therapy and muscle relaxers without any help. Slowly had an ultrasound showing evidence of a 4.5 cm deep soft tissue mass and this is followed by MRI.  Please note that I have personally review both imaging studies.  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 Hartman's procedure she has recovered well.  She experiences some bulging and she is unsure whether or not this is a hernia.  She continues to have good ostomy output.  No abdominal pain.  She is back to work.  She denies any fevers any chills or any other symptoms.\  Does report some alopecia and has started some multivitamins.  HPI  Past Medical History:  Diagnosis Date   Allergy    Anxiety    Chicken pox    Colostomy in place [facility])    Depression    Heart murmur    Lyme disease    patient reports a remote history of lyme and alludes that she has "chronic lyme"   Migraines    MIGRAINES   Pneumonia 07/2017    Past Surgical History:  Procedure Laterality Date   CHROMOPERTUBATION  02/16/2018   Procedure: CHROMOPERTUBATION;  Surgeon: Schermerhorn, Gwen Her, MD;  Location: [facility];  Service: Gynecology;;   CHROMOPERTUBATION Left 03/18/2022   Procedure: CHROMOPERTUBATION;  Surgeon: Rubie Maid, MD;  Location: [facility];  Service: Gynecology;  Laterality: Left;   COLECTOMY WITH COLOSTOMY  CREATION/HARTMANN PROCEDURE N/A 03/27/2022   Procedure: SIGMOID COLECTOMY WITH COLOSTOMY CREATION/HARTMANN PROCEDURE; LYSIS OF ADHESIONS;  Surgeon: Jules Husbands, MD;  Location: [facility];  Service: General;  Laterality: N/A;   CYSTECTOMY Left    OVARY   DILATION AND CURETTAGE OF UTERUS  2008   LAPAROSCOPIC LYSIS OF ADHESIONS N/A 02/16/2018   Procedure: EXTENSIVE LAPAROSCOPIC LYSIS OF ADHESIONS;  Surgeon: Boykin Nearing, MD;  Location: [facility];  Service: Gynecology;  Laterality: N/A;   LAPAROSCOPY N/A 02/16/2018   Procedure: LAPAROSCOPY OPERATIVE;  Surgeon: Schermerhorn, Gwen Her, MD;  Location: [facility];  Service: Gynecology;  Laterality: N/A;   LAPAROTOMY N/A 03/27/2022   Procedure: EXPLORATORY LAPAROTOMY;  Surgeon: Jules Husbands, MD;  Location: [facility];  Service: General;  Laterality: N/A;   LYSIS OF ADHESION N/A 03/18/2022   Procedure: LYSIS OF ADHESION;  Surgeon: Rubie Maid, MD;  Location: [facility];  Service: Gynecology;  Laterality: N/A;   XI ROBOTIC ASSISTED SALPINGECTOMY Left 03/18/2022   Procedure: XI ROBOTIC ASSISTED SALPINGECTOMY;  Surgeon: Rubie Maid, MD;  Location: [facility];  Service: Gynecology;  Laterality: Left;    Family History  Problem Relation Age of Onset   Mental illness Mother    Mental illness Father    Mental illness Maternal Grandmother    Mental illness Paternal Grandmother     Social History Social History   Tobacco Use  Smoking status: Former    Packs/day: 0.75    Years: 14.00    Total pack years: 10.50    Types: Cigarettes    Quit date: 07/05/2016    Years since quitting: 5.9   Smokeless tobacco: Never  Vaping Use   Vaping Use: Former  Substance Use Topics   Alcohol use: Not Currently    Alcohol/week: 11.0 standard drinks of alcohol    Types: 3 Glasses of wine, 2 Cans of beer, 3 Shots of liquor, 3 Standard drinks or equivalent per week    Comment: almost daily   Drug use: No    Allergies  Allergen Reactions   Tylenol  [Acetaminophen] Other (See Comments)    Lingering Taste    Current Outpatient Medications  Medication Sig Dispense Refill   Vitamin D, Ergocalciferol, (DRISDOL) 1.25 MG (50000 UNIT) CAPS capsule Take 50,000 Units by mouth every 7 (seven) days.     zinc gluconate 50 MG tablet Take 1 tablet (50 mg total) by mouth daily. 30 tablet 1   No current facility-administered medications for this visit.     Review of Systems Full ROS  was asked and was negative except for the information on the HPI  Physical Exam Blood pressure 116/77, pulse (!) 103, temperature 98.5 F (36.9 C), temperature source Oral, weight 195 lb 6.4 oz (88.6 kg), SpO2 98 %. CONSTITUTIONAL: NAD. EYES: Pupils are equal, round, and reactive to light, Sclera are non-icteric. EARS, NOSE, MOUTH AND THROAT: The oropharynx is clear. The oral mucosa is pink and moist. Hearing is intact to voice. NECK: Left posterior neck mass 5 cms, deep, mobile , mildly tender to palpation located on level [facility].Marland Kitchen LYMPH NODES:  Lymph nodes in the neck are normal. RESPIRATORY:  Lungs are clear. There is normal respiratory effort, with equal breath sounds bilaterally, and without pathologic use of accessory muscles. CARDIOVASCULAR: Heart is regular without murmurs, gallops, or rubs. GI: The abdomen is  soft, nontender, and nondistended.  Midline laparotomy scar healed and ostomy working, no evidence of hernias are apparent but exam is limited de to pt discomfort.There are no palpable masses. There is no hepatosplenomegaly. There are normal bowel sounds . GU: Rectal deferred.   MUSCULOSKELETAL: Normal muscle strength and tone. No cyanosis or edema.   SKIN: Turgor is good and there are no pathologic skin lesions or ulcers. NEUROLOGIC: Motor and sensation is grossly normal. Cranial nerves are grossly intact. PSYCH:  Oriented to person, place and time. Affect is normal.  Data Reviewed  I have personally reviewed the patient's imaging, laboratory  findings and medical records.    Assessment/Plan  36 year old female well-known to me now with a symptomatic soft tissue mass on posterior left neck.  Discussed with the patient in detail and I do recommend excision given symptoms and also patient wishes to have this done. This is subfascial and deep within the neck we will need to be doing this under general endotracheal anesthesia and placed patient in a prone position.  Procedure discussed with her in detail.  Risks, benefits and possible complications including but not limited to: Bleeding, infection spinal accessory nerve injury, chronic pain.  She understands and wished to proceed. He also has alopecia and we will obtain zinc level and empirically start zinc gluconate.  We will make a full ropy a referral for dermatology. Regarding her potential abdominal bulging we will obtain a CT scan of the abdomen pelvis to ensure there is no other hernias or intra-abdominal pathology. Note that I  spent greater than 45 minutes in this encounter including personally reviewing imaging studies, coordinating her care, placing orders, counseling the patient and performing appropriate documentation. A copy of this report was sent to the referring provider [id number]  Caroleen Hamman, MD [place] [facility] 07/03/2022, 9:37 AM

## 2022-07-10 ENCOUNTER — Encounter (HOSPITAL_COMMUNITY): Payer: Self-pay | Admitting: Nurse Practitioner

## 2022-07-12 ENCOUNTER — Other Ambulatory Visit
Admission: RE | Admit: 2022-07-12 | Discharge: 2022-07-12 | Disposition: A | Discharge status: Home / Self Care | Payer: No Typology Code available for payment source | Attending: Surgery | Admitting: Surgery

## 2022-07-12 DIAGNOSIS — T8143XA Infection following a procedure, organ and space surgical site, initial encounter: Secondary | ICD-10-CM | POA: Insufficient documentation

## 2022-07-12 DIAGNOSIS — L659 Nonscarring hair loss, unspecified: Secondary | ICD-10-CM | POA: Diagnosis present

## 2022-07-12 LAB — CBC
HCT: 44.9 % (ref 36.0–46.0)
Hemoglobin: 15.3 g/dL — ABNORMAL HIGH (ref 12.0–15.0)
MCH: 27.1 pg (ref 26.0–34.0)
MCHC: 34.1 g/dL (ref 30.0–36.0)
MCV: 79.6 fL — ABNORMAL LOW (ref 80.0–100.0)
Platelets: 328 10*3/uL (ref 150–400)
RBC: 5.64 MIL/uL — ABNORMAL HIGH (ref 3.87–5.11)
RDW: 13.2 % (ref 11.5–15.5)
WBC: 7.6 10*3/uL (ref 4.0–10.5)
nRBC: 0 % (ref 0.0–0.2)

## 2022-07-12 LAB — COMPREHENSIVE METABOLIC PANEL
ALT: 106 U/L — ABNORMAL HIGH (ref 0–44)
AST: 84 U/L — ABNORMAL HIGH (ref 15–41)
Albumin: 4.5 g/dL (ref 3.5–5.0)
Alkaline Phosphatase: 65 U/L (ref 38–126)
Anion gap: 10 (ref 5–15)
BUN: 12 mg/dL (ref 6–20)
CO2: 25 mmol/L (ref 22–32)
Calcium: 9.6 mg/dL (ref 8.9–10.3)
Chloride: 105 mmol/L (ref 98–111)
Creatinine, Ser: 0.69 mg/dL (ref 0.44–1.00)
GFR, Estimated: >60 mL/min (ref >60)
Glucose, Bld: 135 mg/dL — ABNORMAL HIGH (ref 70–99)
Potassium: 3.6 mmol/L (ref 3.5–5.1)
Sodium: 140 mmol/L (ref 135–145)
Total Bilirubin: 1.4 mg/dL — ABNORMAL HIGH (ref 0.3–1.2)
Total Protein: 8.3 g/dL — ABNORMAL HIGH (ref 6.5–8.1)

## 2022-07-22 ENCOUNTER — Encounter
Admission: RE | Admit: 2022-07-22 | Discharge: 2022-07-22 | Disposition: A | Discharge status: Home / Self Care | Payer: No Typology Code available for payment source | Source: Ambulatory Visit | Attending: Surgery | Admitting: Surgery

## 2022-07-22 VITALS — Ht 63.0 in | Wt 195.3 lb

## 2022-07-22 DIAGNOSIS — Z01818 Encounter for other preprocedural examination: Secondary | ICD-10-CM

## 2022-07-22 HISTORY — DX: Acute kidney failure, unspecified: N17.9

## 2022-07-22 HISTORY — DX: Unspecified ovarian cyst, unspecified side: N83.209

## 2022-07-22 HISTORY — DX: Tobacco use: Z72.0

## 2022-07-22 HISTORY — DX: Localized swelling, mass and lump, neck: R22.1

## 2022-07-22 HISTORY — DX: Ileus, unspecified: K56.7

## 2022-07-22 NOTE — Patient Instructions (Signed)
Your procedure is scheduled on:07-25-22 Thursday Report to the Registration Desk on the 1st floor of the [facility].Then proceed to the 2nd floor Surgery Desk To find out your arrival time, please call [phone] between 1PM - 3PM on:07-24-22 Wednesday If your arrival time is 6:00 am, do not arrive prior to that time as the [facility] entrance doors do not open until 6:00 am.  REMEMBER: Instructions that are not followed completely may result in serious medical risk, up to and including death; or upon the discretion of your surgeon and anesthesiologist your surgery may need to be rescheduled.  Do not eat food OR drink any liquids after midnight the night before surgery.  No gum chewing, lozengers or hard candies.  Do NOT take any medication the day of surgery  One week prior to surgery: Stop Anti-inflammatories (NSAIDS) such as Advil, Aleve, Ibuprofen, Motrin, Naproxen, Naprosyn and Aspirin based products such as Excedrin, Goodys Powder, BC Powder.  Stop ANY OVER THE COUNTER supplements/vitamins NOW (07-22-22) until after surgery (Zinc, Biotin and Prenatal Vitamin)  No Alcohol for 24 hours before or after surgery.  No Smoking including e-cigarettes for 24 hours prior to surgery.  No chewable tobacco products for at least 6 hours prior to surgery.  No nicotine patches on the day of surgery.  Do not use any "recreational" drugs for at least a week prior to your surgery.  Please be advised that the combination of cocaine and anesthesia may have negative outcomes, up to and including death. If you test positive for cocaine, your surgery will be cancelled.  On the morning of surgery brush your teeth with toothpaste and water, you may rinse your mouth with mouthwash if you wish. Do not swallow any toothpaste or mouthwash.  Use CHG Soap as directed on instruction sheet.  Do not wear jewelry, make-up, hairpins, clips or nail polish.  Do not wear lotions, powders, or perfumes.    Do not shave body from the neck down 48 hours prior to surgery just in case you cut yourself which could leave a site for infection.  Also, freshly shaved skin may become irritated if using the CHG soap.  Contact lenses, hearing aids and dentures may not be worn into surgery.  Do not bring valuables to the hospital. [facility] is not responsible for any missing/lost belongings or valuables.   Notify your doctor if there is any change in your medical condition (cold, fever, infection).  Wear comfortable clothing (specific to your surgery type) to the hospital.  After surgery, you can help prevent lung complications by doing breathing exercises.  Take deep breaths and cough every 1-2 hours. Your doctor may order a device called an Incentive Spirometer to help you take deep breaths. When coughing or sneezing, hold a pillow firmly against your incision with both hands. This is called "splinting." Doing this helps protect your incision. It also decreases belly discomfort.  If you are being admitted to the hospital overnight, leave your suitcase in the car. After surgery it may be brought to your room.  If you are being discharged the day of surgery, you will not be allowed to drive home. You will need a responsible adult (18 years or older) to drive you home and stay with you that night.   If you are taking public transportation, you will need to have a responsible adult (18 years or older) with you. Please confirm with your physician that it is acceptable to use public transportation.   Please  call the [place] Dept. at [phone] if you have any questions about these instructions.  Surgery Visitation Policy:  Patients undergoing a surgery or procedure may have two family members or support persons with them as long as the person is not COVID-19 positive or experiencing its symptoms.   MASKING: Due to an increase in RSV rates and hospitalizations, starting Wednesday,  Nov. 15, in patient care areas in which we serve newborns, infants and children, masks will be required for teammates and visitors.  Children ages 81 and under may not visit. This policy affects the following departments only:  [place] Postpartum area Mother Baby Unit Newborn nursery/Special care nursery  Other areas: Masks continue to be strongly recommended for [place] teammates, visitors and patients in all other areas. Visitation is not restricted outside of the units listed above.

## 2022-07-24 ENCOUNTER — Telehealth: Payer: Self-pay

## 2022-07-24 LAB — ZINC: Zinc: 78 ug/dL (ref 44–115)

## 2022-07-24 NOTE — Telephone Encounter (Signed)
Message left for the patient letting her know that the Zinc level was normal. She may call back with any questions.

## 2022-07-24 NOTE — Telephone Encounter (Signed)
-----   Message from Jules Husbands, MD sent at 07/24/2022  4:40 PM EST ----- Please let her know zinc level is nml  ----- Message ----- From: Buel Ream, Lab In [place] Sent: 07/24/2022   4:36 PM EST To: Jules Husbands, MD

## 2022-07-25 ENCOUNTER — Other Ambulatory Visit: Payer: Self-pay

## 2022-07-25 ENCOUNTER — Encounter: Payer: Self-pay | Admitting: Surgery

## 2022-07-25 ENCOUNTER — Ambulatory Visit: Payer: No Typology Code available for payment source | Admitting: Registered Nurse

## 2022-07-25 ENCOUNTER — Encounter
Admission: RE | Disposition: A | Discharge status: Home / Self Care | Payer: Self-pay | Source: Home / Self Care | Attending: Surgery

## 2022-07-25 ENCOUNTER — Ambulatory Visit
Admission: RE | Admit: 2022-07-25 | Discharge: 2022-07-25 | Disposition: A | Discharge status: Home / Self Care | Payer: No Typology Code available for payment source | Attending: Surgery | Admitting: Surgery

## 2022-07-25 DIAGNOSIS — Z9049 Acquired absence of other specified parts of digestive tract: Secondary | ICD-10-CM | POA: Diagnosis not present

## 2022-07-25 DIAGNOSIS — R221 Localized swelling, mass and lump, neck: Secondary | ICD-10-CM | POA: Insufficient documentation

## 2022-07-25 DIAGNOSIS — Z87891 Personal history of nicotine dependence: Secondary | ICD-10-CM | POA: Diagnosis not present

## 2022-07-25 DIAGNOSIS — L659 Nonscarring hair loss, unspecified: Secondary | ICD-10-CM | POA: Insufficient documentation

## 2022-07-25 DIAGNOSIS — Z933 Colostomy status: Secondary | ICD-10-CM | POA: Diagnosis not present

## 2022-07-25 DIAGNOSIS — Z01818 Encounter for other preprocedural examination: Secondary | ICD-10-CM

## 2022-07-25 HISTORY — PX: EXCISION MASS NECK: SHX6703

## 2022-07-25 LAB — POCT PREGNANCY, URINE: Preg Test, Ur: NEGATIVE

## 2022-07-25 SURGERY — EXCISION, MASS, NECK
Anesthesia: General

## 2022-07-25 MED ORDER — LACTATED RINGERS IV SOLN: INTRAVENOUS | Status: DC

## 2022-07-25 MED ORDER — CELECOXIB 200 MG PO CAPS: 200.0000 mg | ORAL_CAPSULE | ORAL | Status: AC

## 2022-07-25 MED ORDER — CELECOXIB 200 MG PO CAPS
ORAL_CAPSULE | ORAL | Status: AC
  Administered 2022-07-25: 200 mg via ORAL
  Filled 2022-07-25: qty 1

## 2022-07-25 MED ORDER — BUPIVACAINE-EPINEPHRINE (PF) 0.5% -1:200000 IJ SOLN
INTRAMUSCULAR | Status: AC
  Filled 2022-07-25: qty 30

## 2022-07-25 MED ORDER — FENTANYL CITRATE (PF) 100 MCG/2ML IJ SOLN
INTRAMUSCULAR | Status: DC | PRN
  Administered 2022-07-25 (×2): 50 ug via INTRAVENOUS

## 2022-07-25 MED ORDER — SUGAMMADEX SODIUM 200 MG/2ML IV SOLN
INTRAVENOUS | Status: DC | PRN
  Administered 2022-07-25: 177.2 mg via INTRAVENOUS

## 2022-07-25 MED ORDER — FENTANYL CITRATE (PF) 100 MCG/2ML IJ SOLN
INTRAMUSCULAR | Status: AC
  Filled 2022-07-25: qty 2

## 2022-07-25 MED ORDER — LIDOCAINE HCL (CARDIAC) PF 100 MG/5ML IV SOSY
PREFILLED_SYRINGE | INTRAVENOUS | Status: DC | PRN
  Administered 2022-07-25: 100 mg via INTRAVENOUS

## 2022-07-25 MED ORDER — MIDAZOLAM HCL 2 MG/2ML IJ SOLN
INTRAMUSCULAR | Status: AC
  Filled 2022-07-25: qty 2

## 2022-07-25 MED ORDER — FENTANYL CITRATE (PF) 100 MCG/2ML IJ SOLN
25.0000 ug | INTRAMUSCULAR | Status: DC | PRN
  Administered 2022-07-25 (×4): 25 ug via INTRAVENOUS

## 2022-07-25 MED ORDER — KETAMINE HCL 50 MG/5ML IJ SOSY
PREFILLED_SYRINGE | INTRAMUSCULAR | Status: AC
  Filled 2022-07-25: qty 5

## 2022-07-25 MED ORDER — CHLORHEXIDINE GLUCONATE CLOTH 2 % EX PADS
6.0000 | MEDICATED_PAD | Freq: Once | CUTANEOUS | Status: AC
  Administered 2022-07-25: 6 via TOPICAL

## 2022-07-25 MED ORDER — FAMOTIDINE 20 MG PO TABS
ORAL_TABLET | ORAL | Status: AC
  Administered 2022-07-25: 20 mg via ORAL
  Filled 2022-07-25: qty 1

## 2022-07-25 MED ORDER — CEFAZOLIN SODIUM-DEXTROSE 2-4 GM/100ML-% IV SOLN
2.0000 g | INTRAVENOUS | Status: AC
  Administered 2022-07-25: 2 g via INTRAVENOUS

## 2022-07-25 MED ORDER — DEXAMETHASONE SODIUM PHOSPHATE 10 MG/ML IJ SOLN
INTRAMUSCULAR | Status: DC | PRN
  Administered 2022-07-25: 10 mg via INTRAVENOUS

## 2022-07-25 MED ORDER — BUPIVACAINE-EPINEPHRINE (PF) 0.25% -1:200000 IJ SOLN
INTRAMUSCULAR | Status: AC
  Filled 2022-07-25: qty 30

## 2022-07-25 MED ORDER — CHLORHEXIDINE GLUCONATE 0.12 % MT SOLN: 15.0000 mL | Freq: Once | OROMUCOSAL | Status: AC

## 2022-07-25 MED ORDER — FAMOTIDINE 20 MG PO TABS: 20.0000 mg | ORAL_TABLET | Freq: Once | ORAL | Status: AC

## 2022-07-25 MED ORDER — PHENYLEPHRINE HCL (PRESSORS) 10 MG/ML IV SOLN
INTRAVENOUS | Status: DC | PRN
  Administered 2022-07-25 (×2): 120 ug via INTRAVENOUS
  Administered 2022-07-25 (×2): 80 ug via INTRAVENOUS
  Administered 2022-07-25: 120 ug via INTRAVENOUS

## 2022-07-25 MED ORDER — FENTANYL CITRATE (PF) 100 MCG/2ML IJ SOLN
INTRAMUSCULAR | Status: AC
  Administered 2022-07-25: 25 ug via INTRAVENOUS
  Filled 2022-07-25: qty 2

## 2022-07-25 MED ORDER — ROCURONIUM BROMIDE 100 MG/10ML IV SOLN
INTRAVENOUS | Status: DC | PRN
  Administered 2022-07-25: 70 mg via INTRAVENOUS
  Administered 2022-07-25: 10 mg via INTRAVENOUS

## 2022-07-25 MED ORDER — BUPIVACAINE LIPOSOME 1.3 % IJ SUSP
INTRAMUSCULAR | Status: AC
  Filled 2022-07-25: qty 20

## 2022-07-25 MED ORDER — LIDOCAINE HCL (PF) 2 % IJ SOLN
INTRAMUSCULAR | Status: AC
  Filled 2022-07-25: qty 5

## 2022-07-25 MED ORDER — GABAPENTIN 300 MG PO CAPS
ORAL_CAPSULE | ORAL | Status: AC
  Administered 2022-07-25: 300 mg via ORAL
  Filled 2022-07-25: qty 1

## 2022-07-25 MED ORDER — ONDANSETRON HCL 4 MG/2ML IJ SOLN
INTRAMUSCULAR | Status: AC
  Filled 2022-07-25: qty 2

## 2022-07-25 MED ORDER — ORAL CARE MOUTH RINSE: 15.0000 mL | Freq: Once | OROMUCOSAL | Status: AC

## 2022-07-25 MED ORDER — OXYCODONE HCL 5 MG PO TABS: 5.0000 mg | ORAL_TABLET | ORAL | 0 refills | Status: DC | PRN

## 2022-07-25 MED ORDER — OXYCODONE HCL 5 MG PO TABS
ORAL_TABLET | ORAL | Status: AC
  Filled 2022-07-25: qty 1

## 2022-07-25 MED ORDER — PROPOFOL 10 MG/ML IV BOLUS
INTRAVENOUS | Status: DC | PRN
  Administered 2022-07-25: 30 mg via INTRAVENOUS
  Administered 2022-07-25: 150 mg via INTRAVENOUS

## 2022-07-25 MED ORDER — CEFAZOLIN SODIUM-DEXTROSE 2-4 GM/100ML-% IV SOLN
INTRAVENOUS | Status: AC
  Filled 2022-07-25: qty 100

## 2022-07-25 MED ORDER — OXYCODONE HCL 5 MG PO TABS
5.0000 mg | ORAL_TABLET | Freq: Once | ORAL | Status: AC
  Administered 2022-07-25: 5 mg via ORAL

## 2022-07-25 MED ORDER — BUPIVACAINE-EPINEPHRINE 0.25% -1:200000 IJ SOLN
INTRAMUSCULAR | Status: DC | PRN
  Administered 2022-07-25: 50 mL via INTRAMUSCULAR

## 2022-07-25 MED ORDER — MIDAZOLAM HCL 2 MG/2ML IJ SOLN
INTRAMUSCULAR | Status: DC | PRN
  Administered 2022-07-25: 2 mg via INTRAVENOUS

## 2022-07-25 MED ORDER — ONDANSETRON HCL 4 MG/2ML IJ SOLN: 4.0000 mg | Freq: Once | INTRAMUSCULAR | Status: DC | PRN

## 2022-07-25 MED ORDER — PROPOFOL 10 MG/ML IV BOLUS
INTRAVENOUS | Status: AC
  Filled 2022-07-25: qty 20

## 2022-07-25 MED ORDER — CHLORHEXIDINE GLUCONATE 0.12 % MT SOLN
OROMUCOSAL | Status: AC
  Administered 2022-07-25: 15 mL via OROMUCOSAL
  Filled 2022-07-25: qty 15

## 2022-07-25 MED ORDER — ONDANSETRON HCL 4 MG/2ML IJ SOLN
INTRAMUSCULAR | Status: DC | PRN
  Administered 2022-07-25: 4 mg via INTRAVENOUS

## 2022-07-25 MED ORDER — DEXMEDETOMIDINE HCL IN NACL 80 MCG/20ML IV SOLN
INTRAVENOUS | Status: DC | PRN
  Administered 2022-07-25 (×3): 8 ug via BUCCAL

## 2022-07-25 MED ORDER — DEXAMETHASONE SODIUM PHOSPHATE 10 MG/ML IJ SOLN
INTRAMUSCULAR | Status: AC
  Filled 2022-07-25: qty 1

## 2022-07-25 MED ORDER — GABAPENTIN 300 MG PO CAPS: 300.0000 mg | ORAL_CAPSULE | ORAL | Status: AC

## 2022-07-25 MED ORDER — KETAMINE HCL 10 MG/ML IJ SOLN
INTRAMUSCULAR | Status: DC | PRN
  Administered 2022-07-25: 25 mg via INTRAVENOUS

## 2022-07-25 SURGICAL SUPPLY — 23 items
APPLIER CLIP 11 MED OPEN (CLIP) ×1
BLADE CLIPPER SURG (BLADE) ×1 IMPLANT
CHLORAPREP W/TINT 26 (MISCELLANEOUS) ×1 IMPLANT
CLIP APPLIE 11 MED OPEN (CLIP) ×1 IMPLANT
DRAPE LAPAROTOMY 100X77 ABD (DRAPES) ×1 IMPLANT
ELECT REM PT RETURN 9FT ADLT (ELECTROSURGICAL) ×1
ELECTRODE REM PT RTRN 9FT ADLT (ELECTROSURGICAL) ×1 IMPLANT
GAUZE 4X4 16PLY ~~LOC~~+RFID DBL (SPONGE) ×1 IMPLANT
GLOVE BIO SURGEON STRL SZ7 (GLOVE) ×1 IMPLANT
GOWN STRL REUS W/ TWL LRG LVL3 (GOWN DISPOSABLE) ×2 IMPLANT
GOWN STRL REUS W/TWL LRG LVL3 (GOWN DISPOSABLE) ×2
MANIFOLD NEPTUNE II (INSTRUMENTS) ×1 IMPLANT
NEEDLE HYPO 22GX1.5 SAFETY (NEEDLE) ×1 IMPLANT
PACK BASIN MINOR ARMC (MISCELLANEOUS) ×1 IMPLANT
SPONGE KITTNER 5P (MISCELLANEOUS) ×1 IMPLANT
SPONGE T-LAP 18X18 ~~LOC~~+RFID (SPONGE) ×1 IMPLANT
SUT PLAIN 3 0 SH 27IN (SUTURE) ×1 IMPLANT
SUT SILK 2 0SH CR/8 30 (SUTURE) IMPLANT
SUT VIC AB 2-0 SH 27 (SUTURE) ×1
SUT VIC AB 2-0 SH 27XBRD (SUTURE) IMPLANT
SYR 10ML LL (SYRINGE) ×1 IMPLANT
TRAP FLUID SMOKE EVACUATOR (MISCELLANEOUS) ×1 IMPLANT
WATER STERILE IRR 500ML POUR (IV SOLUTION) ×1 IMPLANT

## 2022-07-25 NOTE — Anesthesia Procedure Notes (Signed)
Procedure Name: Intubation Date/Time: 07/25/2022 12:30 PM  Performed by: Stassi Fadely, [place], CRNAPre-anesthesia Checklist: Patient identified, Patient being monitored, Timeout performed, Emergency Drugs available and Suction available Patient Re-evaluated:Patient Re-evaluated prior to induction Oxygen Delivery Method: Circle system utilized Preoxygenation: Pre-oxygenation with 100% oxygen Induction Type: IV induction Ventilation: Mask ventilation without difficulty Laryngoscope Size: Mac, 3 and McGraph Grade View: Grade I Tube type: Oral Tube size: 7.0 mm Number of attempts: 1 Airway Equipment and Method: Stylet Placement Confirmation: ETT inserted through vocal cords under direct vision, positive ETCO2 and breath sounds checked- equal and bilateral Secured at: 24 cm Tube secured with: Tape Dental Injury: Teeth and Oropharynx as per pre-operative assessment

## 2022-07-25 NOTE — Interval H&P Note (Signed)
History and Physical Interval Note:  07/25/2022 11:31 AM  Lacey Harrison  has presented today for surgery, with the diagnosis of neck mass.  The various methods of treatment have been discussed with the patient and family. After consideration of risks, benefits and other options for treatment, the patient has consented to  Procedure(s): EXCISION MASS NECK, posterior (N/A) as a surgical intervention.  The patient's history has been reviewed, patient examined, no change in status, stable for surgery.  I have reviewed the patient's chart and labs.  Questions were answered to the patient's satisfaction.     [place]

## 2022-07-25 NOTE — Anesthesia Preprocedure Evaluation (Signed)
Anesthesia Evaluation  Patient identified by MRN, date of birth, ID band Patient awake    Reviewed: Allergy & Precautions, H&P , NPO status , Patient's Chart, lab work & pertinent test results, reviewed documented beta blocker date and time   Airway Mallampati: II  TM Distance: >3 FB Neck ROM: full    Dental  (+) Teeth Intact   Pulmonary pneumonia, resolved, former smoker   Pulmonary exam normal        Cardiovascular Normal cardiovascular exam+ Valvular Problems/Murmurs  Rhythm:regular Rate:Normal     Neuro/Psych  Headaches PSYCHIATRIC DISORDERS Anxiety Depression       GI/Hepatic negative GI ROS, Neg liver ROS,,,  Endo/Other  negative endocrine ROS    Renal/GU Renal disease  negative genitourinary   Musculoskeletal   Abdominal   Peds  Hematology negative hematology ROS (+)   Anesthesia Other Findings Past Medical History: 03/2022: Acute respiratory failure (HCC) No date: AKI (acute kidney injury) ([place])     Comment:  in setting of sepsi, IV contrast, NAGMA-RESOLVED No date: Allergy No date: Anxiety No date: Chicken pox No date: Colostomy in place [facility]) No date: Depression No date: Heart murmur No date: Ileus ([place]) No date: Lyme disease     Comment:  patient reports a remote history of lyme and alludes               that she has "chronic lyme" No date: Migraines     Comment:  MIGRAINES No date: Neck mass No date: Ovarian cyst 07/2017: Pneumonia 03/2022: Sepsis ([place]) No date: Tobacco use Past Surgical History: 02/16/2018: CHROMOPERTUBATION     Comment:  Procedure: CHROMOPERTUBATION;  Surgeon: Boykin Nearing, MD;  Location: [facility] ORS;  Service: Gynecology;; 03/18/2022: Lynnell Chad; Left     Comment:  Procedure: CHROMOPERTUBATION;  Surgeon: Rubie Maid,               MD;  Location: [facility] ORS;  Service: Gynecology;                Laterality: Left; 03/27/2022: COLECTOMY WITH  COLOSTOMY CREATION/HARTMANN PROCEDURE; N/A     Comment:  Procedure: SIGMOID COLECTOMY WITH COLOSTOMY               CREATION/HARTMANN PROCEDURE; LYSIS OF ADHESIONS;                Surgeon: Jules Husbands, MD;  Location: [facility] ORS;                Service: General;  Laterality: N/A; No date: CYSTECTOMY; Left     Comment:  OVARY 2008: DILATION AND CURETTAGE OF UTERUS 02/16/2018: LAPAROSCOPIC LYSIS OF ADHESIONS; N/A     Comment:  Procedure: EXTENSIVE LAPAROSCOPIC LYSIS OF ADHESIONS;                Surgeon: Boykin Nearing, MD;  Location: [facility] ORS;              Service: Gynecology;  Laterality: N/A; 02/16/2018: LAPAROSCOPY; N/A     Comment:  Procedure: LAPAROSCOPY OPERATIVE;  Surgeon:               Boykin Nearing, MD;  Location: [facility] ORS;                Service: Gynecology;  Laterality: N/A; 03/27/2022: LAPAROTOMY; N/A     Comment:  Procedure: EXPLORATORY LAPAROTOMY;  Surgeon: Dahlia Byes,  Marjory Lies, MD;  Location: [facility] ORS;  Service: General;                Laterality: N/A; 03/18/2022: LYSIS OF ADHESION; N/A     Comment:  Procedure: LYSIS OF ADHESION;  Surgeon: Rubie Maid,               MD;  Location: [facility] ORS;  Service: Gynecology;                Laterality: N/A; 03/18/2022: XI ROBOTIC ASSISTED SALPINGECTOMY; Left     Comment:  Procedure: XI ROBOTIC ASSISTED SALPINGECTOMY;  Surgeon:               Rubie Maid, MD;  Location: [facility] ORS;  Service:               Gynecology;  Laterality: Left;   Reproductive/Obstetrics negative OB ROS                             Anesthesia Physical Anesthesia Plan  ASA: 2  Anesthesia Plan: General ETT   Post-op Pain Management:    Induction:   PONV Risk Score and Plan: 4 or greater  Airway Management Planned:   Additional Equipment:   Intra-op Plan:   Post-operative Plan:   Informed Consent: I have reviewed the patients History and Physical, chart, labs and discussed the procedure including  the risks, benefits and alternatives for the proposed anesthesia with the patient or authorized representative who has indicated his/her understanding and acceptance.     Dental Advisory Given  Plan Discussed with: CRNA  Anesthesia Plan Comments:        Anesthesia Quick Evaluation

## 2022-07-25 NOTE — Transfer of Care (Signed)
Immediate Anesthesia Transfer of Care Note  Patient: Lacey Harrison  Procedure(s) Performed: Procedure(s): EXCISION MASS NECK, posterior (N/A)  Patient Location: PACU  Anesthesia Type:General  Level of Consciousness: sedated  Airway & Oxygen Therapy: Patient Spontanous Breathing and Patient connected to face mask oxygen  Post-op Assessment: Report given to RN and Post -op Vital signs reviewed and stable  Post vital signs: Reviewed and stable  Last Vitals:  Vitals:   07/25/22 1446 07/25/22 1448  BP: 100/62 100/62  Pulse: 92 90  Resp: 13 18  Temp: (!) 35.7 C   SpO2: 58% 592%    Complications: No apparent anesthesia complications

## 2022-07-25 NOTE — Discharge Instructions (Addendum)
Neck mass Removal, Care After The following information offers guidance on how to care for yourself after your procedure. Your health care provider may also give you more specific instructions. If you have problems or questions, contact your health care provider. What can I expect after the procedure? After the procedure, it is common to have: Pain, swelling, and soreness in your throat. Difficulty swallowing. A hoarse voice. A small amount of blood in your saliva for a few days. Follow these instructions at home: Medicines Take over-the-counter and prescription medicines only as told by your health care provider. If you were prescribed antibiotics, take them as told by your health care provider. Ask your health care provider if the medicine prescribed to you: Requires you to avoid driving or using machinery. Can cause constipation. You may need to take these actions to prevent or treat constipation: Drink enough fluid to keep your urine pale yellow. Take over-the-counter or prescription medicines. Eat foods that are high in fiber, such as fresh fruits and vegetables, whole grains, and beans. Limit foods that are high in fat and processed sugars, such as fried and sweet foods. Incision care  Follow instructions from your health care provider about how to take care of your incision. Make sure you: Wash your hands with soap and water for at least 20 seconds before and after you change your bandage (dressing). If soap and water are not available, use hand sanitizer. Change your dressing as told by your health care provider. Leave stitches (sutures), skin glue, or adhesive strips in place. These skin closures may need to stay in place for 2 weeks or longer. If adhesive strip edges start to loosen and curl up, you may trim the loose edges. Do not remove adhesive strips completely unless your health care provider tells you to do that. Check your incision area every day for signs of infection. Check  for: More redness, swelling, or pain. Fluid or blood leaking from the incision. Warmth. Pus or a bad smell. If you have a drain in your incision area, follow instructions from your health care provider about how to care for the drain. Activity If you were given a sedative during the procedure, it can affect you for several hours. Do not drive or operate machinery until your health care provider says that it is safe. Do not take baths, swim, or use a hot tub until your health care provider approves. You may have to avoid lifting. Ask your health care provider how much you can safely lift. Return to your normal activities as told by your health care provider. Ask your health care provider what activities are safe for you. General instructions Do not use any products that contain nicotine or tobacco. These products include cigarettes, chewing tobacco, and vaping devices, such as e-cigarettes. If you need help quitting, ask your health care provider. Follow instructions from your health care provider about eating or drinking restrictions. You may be told to have only liquids for the first day after surgery. If swallowing is painful, try eating soft foods until you feel better. Keep all follow-up visits. Your health care provider will evaluate the incision and may need to remove the drain. Contact a health care provider if: You have pain that gets worse or does not get better with medicine. You have hoarseness that does not get better in 7-10 days. You have difficulty swallowing, and this does not go away after 1 week. Your cyst grows back. You vomit or feel nauseous. You have a  fever or chills. You have signs of infection at your incision: More redness, swelling, or pain around your incision. Your incision feels warm to the touch. You have fluid or blood coming from your incision. You have pus or a bad smell coming from your incision. Get help right away if: You have difficulty  breathing. You cannot swallow. You have severe pain. Summary After the procedure, it is common to have pain, swelling, and soreness in your throat. Follow instructions from your health care provider about how to take care of your incision. Check your incision area every day for signs of infection. Follow instructions from your health care provider about eating or drinking restrictions. You may be told to have only liquids for the first day after surgery. This information is not intended to replace advice given to you by your health care provider. Make sure you discuss any questions you have with your health care provider. Document Revised: 09/28/2021 Document Reviewed: 09/28/2021 Elsevier Patient Education  [place]   The drugs that you were given will stay in your system until tomorrow so for the next 24 hours you should not:  Drive an automobile Make any legal decisions Drink any alcoholic beverage   You may resume regular meals tomorrow.  Today it is better to start with liquids and gradually work up to solid foods.  You may eat anything you prefer, but it is better to start with liquids, then soup and crackers, and gradually work up to solid foods.   Please notify your doctor immediately if you have any unusual bleeding, trouble breathing, redness and pain at the surgery site, drainage, fever, or pain not relieved by medication.    Your post-operative visit with Dr.                                       is: Date:                        Time:    Please call to schedule your post-operative visit.  Additional Instructions:

## 2022-07-25 NOTE — Op Note (Signed)
PROCEDURES: Excision of Left posterior Neck mass 5 cms  Pre-operative Diagnosis:Neck mass symptomatic  Post-operative Diagnosis: same  Surgeon: Marjory Lies Kayliah Tindol   Anesthesia: General endotracheal anesthesia  ASA Class: 2  Surgeon: Caroleen Hamman , MD FACS  Anesthesia: Gen. with endotracheal tube   Findings: 5cm Deep lipoma Left posterior Neck Level Va  Estimated Blood Loss: 5cc                Specimens: post neck mass          Complications: none                Condition: stable  Procedure Details  The patient was seen again in the Holding Room. The benefits, complications, treatment options, and expected outcomes were discussed with the patient. The risks of bleeding, infection, recurrence of symptoms, failure to resolve symptoms,  nerve injury, any of which could require further surgery were reviewed with the patient.   The patient was taken to Operating Room, identified as VETRA SHINALL and the procedure verified.  A Time Out was held and the above information confirmed.  Prior to the induction of general anesthesia, antibiotic prophylaxis was administered. VTE prophylaxis was in place. General endotracheal anesthesia was then administered and tolerated well. After the induction I initially did prone position, Given the lateral extension of the mass and the Left shoulder interfering with exposure I decided to reposition the pt into the lateral decubitus for enhanced exposure.,  The patient was positioned in the lateral decubitus position with left side up and all pressure points padded . Neck was prepped and draped int he usual standard fashion. Lateral to posterior incision created with 15 blade knife, cautery was used to dissect the subq tissue and incised the fascia of the paraspinal musculature. The lipoma was posterior to the levator scapula. [place] retractor placed. Lipoma identified and excised form adjacent musculatures. I identified the spinal accessory nerve and  preserved it. We were anterior to it.The small vessels from the lipoma were suture ligated with 3-0 nylon. The specimen was excised and sent for permanent pathology. THe lipoma did not show malignant characteristics but was attached to the adjacent musculature. WOund irrigated with saline. Deep and superficial fascia closed w 2-0 vicryls. Skin closed with 4-0 monocryl Dermabond was used to coat the skin incision. Needle and laparotomy count were correct and there were no immediate occasions. Liposomal marcaine was injected around the incision site.  Caroleen Hamman, MD, FACS

## 2022-07-26 NOTE — Anesthesia Postprocedure Evaluation (Signed)
Anesthesia Post Note  Patient: Lacey Harrison  Procedure(s) Performed: EXCISION MASS NECK, posterior  Patient location during evaluation: PACU Anesthesia Type: General Level of consciousness: awake and alert Pain management: pain level controlled Vital Signs Assessment: post-procedure vital signs reviewed and stable Respiratory status: spontaneous breathing, nonlabored ventilation, respiratory function stable and patient connected to nasal cannula oxygen Cardiovascular status: blood pressure returned to baseline and stable Postop Assessment: no apparent nausea or vomiting Anesthetic complications: no   No notable events documented.   Last Vitals:  Vitals:   07/25/22 1615 07/25/22 1631  BP: 114/82 115/82  Pulse: 93   Resp: 20   Temp: (!) 36.1 C (!) 36.3 C  SpO2: 92% 95%    Last Pain:  Vitals:   07/25/22 1631  TempSrc: Temporal  PainSc: [place]

## 2022-07-29 ENCOUNTER — Telehealth: Payer: Self-pay

## 2022-07-29 LAB — SURGICAL PATHOLOGY

## 2022-07-29 NOTE — Telephone Encounter (Signed)
-----   Message from Jules Husbands, MD sent at 07/29/2022 10:02 AM EST ----- Please let her know pathology was consistent /w lipoma ----- Message ----- From: Interface, Lab In [place] Sent: 07/29/2022   9:44 AM EST To: Jules Husbands, MD

## 2022-07-29 NOTE — Telephone Encounter (Signed)
Notified patient as instructed, patient pleased. Discussed follow-up appointments, patient agrees  

## 2022-08-05 ENCOUNTER — Ambulatory Visit
Admission: RE | Admit: 2022-08-05 | Discharge: 2022-08-05 | Disposition: A | Discharge status: Home / Self Care | Payer: No Typology Code available for payment source | Source: Ambulatory Visit | Attending: Surgery | Admitting: Surgery

## 2022-08-05 DIAGNOSIS — T8143XA Infection following a procedure, organ and space surgical site, initial encounter: Secondary | ICD-10-CM | POA: Diagnosis present

## 2022-08-05 DIAGNOSIS — N83201 Unspecified ovarian cyst, right side: Secondary | ICD-10-CM | POA: Insufficient documentation

## 2022-08-05 DIAGNOSIS — R9389 Abnormal findings on diagnostic imaging of other specified body structures: Secondary | ICD-10-CM | POA: Insufficient documentation

## 2022-08-05 MED ORDER — IOHEXOL 300 MG/ML  SOLN
100.0000 mL | Freq: Once | INTRAMUSCULAR | Status: AC | PRN
  Administered 2022-08-05: 100 mL via INTRAVENOUS

## 2022-08-12 ENCOUNTER — Encounter: Payer: No Typology Code available for payment source | Admitting: Surgery

## 2022-08-12 DIAGNOSIS — N719 Inflammatory disease of uterus, unspecified: Secondary | ICD-10-CM | POA: Insufficient documentation

## 2022-09-02 ENCOUNTER — Ambulatory Visit (INDEPENDENT_AMBULATORY_CARE_PROVIDER_SITE_OTHER): Payer: No Typology Code available for payment source | Admitting: Surgery

## 2022-09-02 ENCOUNTER — Encounter: Payer: Self-pay | Admitting: Surgery

## 2022-09-02 VITALS — BP 109/75 | HR 91 | Temp 98.1°F | Ht 63.0 in | Wt 198.0 lb

## 2022-09-02 DIAGNOSIS — Z09 Encounter for follow-up examination after completed treatment for conditions other than malignant neoplasm: Secondary | ICD-10-CM

## 2022-09-02 DIAGNOSIS — K9400 Colostomy complication, unspecified: Secondary | ICD-10-CM

## 2022-09-02 DIAGNOSIS — R221 Localized swelling, mass and lump, neck: Secondary | ICD-10-CM

## 2022-09-02 NOTE — Patient Instructions (Addendum)
We will get you schedule for a barium enema via rectum and ostomy. You will need to to a bowel prep for this.  We will have you follow up here once we get these results.

## 2022-09-03 ENCOUNTER — Telehealth: Payer: Self-pay

## 2022-09-03 NOTE — Telephone Encounter (Signed)
Message left for the patient to call the office back to get information for her Barium Enema.  The patient is scheduled for a Barium Enema at [facility] on Thursday January 25th. She will need to arrive at the [place] entrance at 8:30 am. She will need to complete her bowel prep and have nothing to eat or drink after midnight the night prior.

## 2022-09-03 NOTE — Telephone Encounter (Signed)
Patient calls back, she is given instructions as indicated below for her barium enema.

## 2022-09-06 NOTE — Progress Notes (Signed)
Is 5 weeks out after excision of large posterior neck mass consistent with lipoma.  No evidence of cancer.  She still has some soreness but is improving. No evidence of nerve damage.  She also had a CT scan that I personally review of the abdomen showing significant improvement without any acute intra-abdominal abnormalities.  No abscess.  PE NAD No evidence of current nerve injuries.  Cranial nerve XI is preserved.  There is a healing wound in the left posterior neck no evidence of infection no evidence of hematoma.  No motor deficits. Abdomen soft with colostomy in place  A/P doing very well after neck mass excision.  We will do barium enema and we will see her back and potentially discuss colostomy takedown.

## 2022-09-19 ENCOUNTER — Inpatient Hospital Stay: Admission: RE | Admit: 2022-09-19 | Payer: No Typology Code available for payment source | Source: Ambulatory Visit

## 2022-09-19 ENCOUNTER — Ambulatory Visit
Admission: RE | Admit: 2022-09-19 | Discharge: 2022-09-19 | Disposition: A | Discharge status: Home / Self Care | Payer: No Typology Code available for payment source | Source: Ambulatory Visit | Attending: Surgery | Admitting: Surgery

## 2022-09-19 DIAGNOSIS — K9400 Colostomy complication, unspecified: Secondary | ICD-10-CM

## 2022-09-30 ENCOUNTER — Other Ambulatory Visit: Payer: Self-pay

## 2022-09-30 ENCOUNTER — Encounter: Payer: Self-pay | Admitting: Surgery

## 2022-09-30 ENCOUNTER — Ambulatory Visit: Payer: No Typology Code available for payment source | Admitting: Surgery

## 2022-09-30 VITALS — BP 113/76 | HR 105 | Temp 98.3°F | Ht 63.0 in | Wt 195.0 lb

## 2022-09-30 DIAGNOSIS — K631 Perforation of intestine (nontraumatic): Secondary | ICD-10-CM | POA: Diagnosis not present

## 2022-09-30 DIAGNOSIS — Z933 Colostomy status: Secondary | ICD-10-CM

## 2022-09-30 DIAGNOSIS — Z433 Encounter for attention to colostomy: Secondary | ICD-10-CM

## 2022-09-30 MED ORDER — METRONIDAZOLE 500 MG PO TABS: ORAL_TABLET | ORAL | 0 refills | Status: DC

## 2022-09-30 MED ORDER — BISACODYL 5 MG PO TBEC: DELAYED_RELEASE_TABLET | ORAL | 0 refills | Status: DC

## 2022-09-30 MED ORDER — NEOMYCIN SULFATE 500 MG PO TABS: ORAL_TABLET | ORAL | 0 refills | Status: DC

## 2022-09-30 MED ORDER — POLYETHYLENE GLYCOL 3350 17 GM/SCOOP PO POWD: ORAL | 0 refills | Status: DC

## 2022-09-30 NOTE — Patient Instructions (Signed)
Please pick up for your prescriptions at your pharmacy.    If you have any concerns or questions, please feel free to call our office.   Colostomy Reversal Surgery A colostomy reversal is a surgical procedure that is done to reverse a colostomy. In this reversal procedure, the large intestine is disconnected from the opening in the abdomen (stoma). Then, the changes that were made to the intestine during the colostomy will be reversed to restore the flow of stool through the entire intestine. Depending on the type of colostomy being reversed, this may involve one of the following: Reconnecting the two ends of the intestine that were separated during colostomy surgery. Closing the opening that was made in the side of the intestine to allow stool to be redirected through the stoma. After this surgery, a stoma and colostomy bag are no longer needed. Stool (feces) can leave your body through the rectum, as it did before you had a colostomy. Tell a health care provider about: Any allergies you have. All medicines you are taking, including vitamins, herbs, eye drops, creams, and over-the-counter medicines. Any problems you or family members have had with anesthetic medicines. Any bleeding problems you have. Any surgeries you have had. Any medical conditions you have. Whether you are pregnant or may be pregnant. What are the risks? Generally, this is a safe procedure. However, problems may occur, including: Infection. Bleeding. Allergic reactions to medicines. Damage to nearby structures or organs. A temporary condition in which the intestines stop moving and working correctly (ileus). This usually goes away in 3-7 days. A collection of pus (abscess) in the abdomen or pelvis. Intestinal blockage. Leaking at the area of the intestine where it was reconnected (anastomotic leak) or where the opening of the stoma was closed. Narrowing of the intestine (stricture) at the place where it was  reconnected. Urinary and sexual dysfunction. What happens before the procedure? When to stop eating and drinking Follow instructions from your health care provider about what you may eat and drink before your procedure. These may include: 8 hours before your procedure Stop eating most foods. Do not eat meat, fried foods, or fatty foods. Eat only light foods, such as toast or crackers. All liquids are okay except energy drinks and alcohol. 6 hours before your procedure Stop eating. Drink only clear liquids, such as water, clear fruit juice, black coffee, plain tea, and sports drinks. Do not drink energy drinks or alcohol. 2 hours before your procedure Stop drinking all liquids. You may be allowed to take medicines with small sips of water. If you do not follow your health care provider's instructions, your procedure may be delayed or canceled. Medicines Ask your health care provider about: Changing or stopping your regular medicines. This is especially important if you are taking diabetes medicines or blood thinners. Taking medicines such as aspirin and ibuprofen. These medicines can thin your blood. Do not take these medicines unless your health care provider tells you to take them. Taking over-the-counter medicines, vitamins, herbs, and supplements. General instructions You may have an exam or testing. Do not use any products that contain nicotine or tobacco before the procedure. These products include cigarettes, chewing tobacco, and vaping devices, such as e-cigarettes. If you need help quitting, ask your health care provider. Ask your health care provider what steps will be taken to help prevent infection. These may include: Removing hair at the surgery site. Washing skin with a germ-killing soap. Taking antibiotic medicine. What happens during the procedure?  An  IV will be inserted into one of your veins. You may be given: A medicine to help you relax (sedative). A medicine to  make you fall asleep (general anesthetic). An incision will be made in your abdomen at the site of the stoma. The large intestine will be disconnected from the abdomen at the site of the stoma. The next steps will vary depending on the type of colostomy reversal surgery you are having. There are two main types: End colostomy reversal. The surgeon will use stitches (sutures) or staples to reconnect the two ends of the intestine that were separated during the end colostomy. Loop colostomy reversal. The surgeon will use sutures or staples to close the opening in the intestine that had been allowing stool to be redirected through the stoma. The intestine will then be put back into its normal position inside the abdomen. The incision will be closed with sutures, skin glue, or adhesive strips. It may be covered with bandages (dressings). The procedure may vary among health care providers and hospitals. What happens after the procedure? Your blood pressure, heart rate, breathing rate, and blood oxygen level will be monitored until you leave the hospital or clinic. You will be given pain medicine as needed. You will slowly increase your diet and movement as told by your health care provider. Summary A colostomy reversal is a surgical procedure that is done to reverse a colostomy. After this surgery, stool (feces) can leave your body through the rectum, as it did before you had a colostomy. Before the procedure, follow instructions from your health care provider about taking medicines and about eating and drinking. During the procedure, your colostomy will be reversed, and the incision will be closed with sutures, skin glue, or adhesive strips. It may be covered with bandages (dressings). After the procedure, you will slowly increase your diet and movement as told by your health care provider. This information is not intended to replace advice given to you by your health care provider. Make sure you discuss  any questions you have with your health care provider. Document Revised: 04/06/2021 Document Reviewed: 04/06/2021 Elsevier Patient Education  [place].

## 2022-10-01 ENCOUNTER — Telehealth: Payer: Self-pay | Admitting: Surgery

## 2022-10-01 NOTE — Telephone Encounter (Signed)
  Patient has been advised of Pre-Admission date/time, and Surgery date at [facility]  Surgery Date: 10/15/22 Preadmission Testing Date: 10/07/22 (phone 1p-5p)  Patient has been made aware to call [phone], between 1-3:00pm the day before surgery, to find out what time to arrive for surgery.

## 2022-10-01 NOTE — Telephone Encounter (Signed)
Patient calls back and she is informed of all dates regarding her surgery.

## 2022-10-03 ENCOUNTER — Encounter: Payer: Self-pay | Admitting: Surgery

## 2022-10-03 NOTE — Progress Notes (Signed)
Outpatient Surgical Follow Up  10/03/2022  Lacey Harrison is an 36 y.o. female.   Chief Complaint  Patient presents with   Follow-up    Discuss colostomy reversal    HPI: Lacey Harrison is a 36-year-old female status post Hartman's procedure on March 27, 2022 for iatrogenic sigmoid injury.  She has recovered very well.  No fevers no chills she is ready for ostomy takedown.  She completed a barium enema without evidence of any mechanical obstruction or concerning lesions on the colon or the rectal stump.  There is reasonable length on the rectal stump.  She is back to baseline is walking is tolerating diet and the colostomy is working well.  Past Medical History:  Diagnosis Date   Acute respiratory failure (HCC) 03/2022   AKI (acute kidney injury) (HCC)    in setting of sepsi, IV contrast, NAGMA-RESOLVED   Allergy    Anxiety    Chicken pox    Colostomy in place (HCC)    Depression    Heart murmur    Ileus (HCC)    Lyme disease    patient reports a remote history of lyme and alludes that she has "chronic lyme"   Migraines    MIGRAINES   Neck mass    Ovarian cyst    Pneumonia 07/2017   Sepsis (HCC) 03/2022   Tobacco use     Past Surgical History:  Procedure Laterality Date   CHROMOPERTUBATION  02/16/2018   Procedure: CHROMOPERTUBATION;  Surgeon: Schermerhorn, Thomas J, MD;  Location: [facility];  Service: Gynecology;;   CHROMOPERTUBATION Left 03/18/2022   Procedure: CHROMOPERTUBATION;  Surgeon: Cherry, Anika, MD;  Location: [facility];  Service: Gynecology;  Laterality: Left;   COLECTOMY WITH COLOSTOMY CREATION/HARTMANN PROCEDURE N/A 03/27/2022   Procedure: SIGMOID COLECTOMY WITH COLOSTOMY CREATION/HARTMANN PROCEDURE; LYSIS OF ADHESIONS;  Surgeon: Jashua Knaak F, MD;  Location: [facility];  Service: General;  Laterality: N/A;   CYSTECTOMY Left    OVARY   DILATION AND CURETTAGE OF UTERUS  2008   EXCISION MASS NECK N/A 07/25/2022   Procedure: EXCISION MASS NECK, posterior;  Surgeon:  Cherye Gaertner F, MD;  Location: [facility];  Service: General;  Laterality: N/A;   LAPAROSCOPIC LYSIS OF ADHESIONS N/A 02/16/2018   Procedure: EXTENSIVE LAPAROSCOPIC LYSIS OF ADHESIONS;  Surgeon: Schermerhorn, Thomas J, MD;  Location: [facility];  Service: Gynecology;  Laterality: N/A;   LAPAROSCOPY N/A 02/16/2018   Procedure: LAPAROSCOPY OPERATIVE;  Surgeon: Schermerhorn, Thomas J, MD;  Location: [facility];  Service: Gynecology;  Laterality: N/A;   LAPAROTOMY N/A 03/27/2022   Procedure: EXPLORATORY LAPAROTOMY;  Surgeon: Amberlea Spagnuolo F, MD;  Location: [facility];  Service: General;  Laterality: N/A;   LYSIS OF ADHESION N/A 03/18/2022   Procedure: LYSIS OF ADHESION;  Surgeon: Cherry, Anika, MD;  Location: [facility];  Service: Gynecology;  Laterality: N/A;   XI ROBOTIC ASSISTED SALPINGECTOMY Left 03/18/2022   Procedure: XI ROBOTIC ASSISTED SALPINGECTOMY;  Surgeon: Cherry, Anika, MD;  Location: [facility];  Service: Gynecology;  Laterality: Left;    Family History  Problem Relation Age of Onset   Mental illness Mother    Mental illness Father    Mental illness Maternal Grandmother    Mental illness Paternal Grandmother     Social History:  reports that she quit smoking about 6 years ago. Her smoking use included cigarettes. She has a 10.50 pack-year smoking history. She has been exposed to tobacco smoke. She has never used smokeless tobacco. She reports that she does   not currently use alcohol after a past usage of about 11.0 standard drinks of alcohol per week. She reports that she does not use drugs.  Allergies:  Allergies  Allergen Reactions   Tylenol [Acetaminophen] Other (See Comments)    Lingering Taste    Medications reviewed.    ROS Full ROS performed and is otherwise negative other than what is stated in HPI   BP 113/76   Pulse (!) 105   Temp 98.3 F (36.8 C) (Oral)   Ht 5' 3" (1.6 m)   Wt 195 lb (88.5 kg)   LMP 09/08/2022 (Exact Date)   SpO2 97%   BMI 34.54 kg/m   Physical  Exam CONSTITUTIONAL: NAD. EYES: Pupils are equal, round, and reactive to light, Sclera are non-icteric. EARS, NOSE, MOUTH AND THROAT: The oropharynx is clear. The oral mucosa is pink and moist. Hearing is intact to voice. NECK: Left posterior incision healed, no infection. LYMPH NODES:  Lymph nodes in the neck are normal. RESPIRATORY:  Lungs are clear. There is normal respiratory effort, with equal breath sounds bilaterally, and without pathologic use of accessory muscles. CARDIOVASCULAR: Heart is regular without murmurs, gallops, or rubs. GI: The abdomen is  soft, nontender, and nondistended.  Midline laparotomy scar healed and ostomy working, no evidence of hernias are apparent but exam is limited de to pt discomfort.There are no palpable masses. There is no hepatosplenomegaly. There are normal bowel sounds . GU: Rectal deferred.   MUSCULOSKELETAL: Normal muscle strength and tone. No cyanosis or edema.   SKIN: Turgor is good and there are no pathologic skin lesions or ulcers. NEUROLOGIC: Motor and sensation is grossly normal. Cranial nerves are grossly intact. PSYCH:  Oriented to person, place and time. Affect is normal.     Assessment/Plan: 36-year-old female status post Hartman's for sigmoid injury.  She has done very well and now is ready for colostomy takedown.  Barium enema did not show any evidence of a stricture and decent length on the stump.  Discussed with the patient in detail about open Hartman's reversal.  Procedure discussed with her in detail.  Risk, benefits and possible complications including but not limited to: Bleeding, infection bowel injuries chronic pain and prolonged hospitalization.  She wishes to proceed.  We will be happy to perform lysis of adhesions on regarding the fallopian tube and ovaries if we see something abnormal on the outside I will be happy to remove the adhesions.  I will not do anything intraluminal or any oophorectomies or any surgeries to the pelvic  organs.  She understands this.  Please note that I spent at least 40 minutes in this encounter including personally reviewing imaging studies, coordinating her care, placing orders and performing appropriate documentation  Wendy Hoback, MD FACS General Surgeon 

## 2022-10-03 NOTE — H&P (View-Only) (Signed)
Outpatient Surgical Follow Up  10/03/2022  Lacey Harrison is an 37 y.o. female.   Chief Complaint  Patient presents with   Follow-up    Discuss colostomy reversal    HPI: Lacey Harrison is a 37 year old female status post Hartman's procedure on March 27, 2022 for iatrogenic sigmoid injury.  She has recovered very well.  No fevers no chills she is ready for ostomy takedown.  She completed a barium enema without evidence of any mechanical obstruction or concerning lesions on the colon or the rectal stump.  There is reasonable length on the rectal stump.  She is back to baseline is walking is tolerating diet and the colostomy is working well.  Past Medical History:  Diagnosis Date   Acute respiratory failure ([place]) 03/2022   AKI (acute kidney injury) ([place])    in setting of sepsi, IV contrast, NAGMA-RESOLVED   Allergy    Anxiety    Chicken pox    Colostomy in place [facility])    Depression    Heart murmur    Ileus ([place])    Lyme disease    patient reports a remote history of lyme and alludes that she has "chronic lyme"   Migraines    MIGRAINES   Neck mass    Ovarian cyst    Pneumonia 07/2017   Sepsis ([place]) 03/2022   Tobacco use     Past Surgical History:  Procedure Laterality Date   CHROMOPERTUBATION  02/16/2018   Procedure: CHROMOPERTUBATION;  Surgeon: Schermerhorn, Gwen Her, MD;  Location: [facility];  Service: Gynecology;;   CHROMOPERTUBATION Left 03/18/2022   Procedure: Lynnell Chad;  Surgeon: Rubie Maid, MD;  Location: [facility];  Service: Gynecology;  Laterality: Left;   COLECTOMY WITH COLOSTOMY CREATION/HARTMANN PROCEDURE N/A 03/27/2022   Procedure: SIGMOID COLECTOMY WITH COLOSTOMY CREATION/HARTMANN PROCEDURE; LYSIS OF ADHESIONS;  Surgeon: Jules Husbands, MD;  Location: [facility];  Service: General;  Laterality: N/A;   CYSTECTOMY Left    OVARY   DILATION AND CURETTAGE OF UTERUS  2008   EXCISION MASS NECK N/A 07/25/2022   Procedure: EXCISION MASS NECK, posterior;  Surgeon:  Jules Husbands, MD;  Location: [facility];  Service: General;  Laterality: N/A;   LAPAROSCOPIC LYSIS OF ADHESIONS N/A 02/16/2018   Procedure: EXTENSIVE LAPAROSCOPIC LYSIS OF ADHESIONS;  Surgeon: Boykin Nearing, MD;  Location: [facility];  Service: Gynecology;  Laterality: N/A;   LAPAROSCOPY N/A 02/16/2018   Procedure: LAPAROSCOPY OPERATIVE;  Surgeon: Schermerhorn, Gwen Her, MD;  Location: [facility];  Service: Gynecology;  Laterality: N/A;   LAPAROTOMY N/A 03/27/2022   Procedure: EXPLORATORY LAPAROTOMY;  Surgeon: Jules Husbands, MD;  Location: [facility];  Service: General;  Laterality: N/A;   LYSIS OF ADHESION N/A 03/18/2022   Procedure: LYSIS OF ADHESION;  Surgeon: Rubie Maid, MD;  Location: [facility];  Service: Gynecology;  Laterality: N/A;   XI ROBOTIC ASSISTED SALPINGECTOMY Left 03/18/2022   Procedure: XI ROBOTIC ASSISTED SALPINGECTOMY;  Surgeon: Rubie Maid, MD;  Location: [facility];  Service: Gynecology;  Laterality: Left;    Family History  Problem Relation Age of Onset   Mental illness Mother    Mental illness Father    Mental illness Maternal Grandmother    Mental illness Paternal Grandmother     Social History:  reports that she quit smoking about 6 years ago. Her smoking use included cigarettes. She has a 10.50 pack-year smoking history. She has been exposed to tobacco smoke. She has never used smokeless tobacco. She reports that she does  not currently use alcohol after a past usage of about 11.0 standard drinks of alcohol per week. She reports that she does not use drugs.  Allergies:  Allergies  Allergen Reactions   Tylenol [Acetaminophen] Other (See Comments)    Lingering Taste    Medications reviewed.    ROS Full ROS performed and is otherwise negative other than what is stated in HPI   BP 113/76   Pulse (!) 105   Temp 98.3 F (36.8 C) (Oral)   Ht 5' 3"$  (1.6 m)   Wt 195 lb (88.5 kg)   LMP 09/08/2022 (Exact Date)   SpO2 97%   BMI 34.54 kg/m   Physical  Exam CONSTITUTIONAL: NAD. EYES: Pupils are equal, round, and reactive to light, Sclera are non-icteric. EARS, NOSE, MOUTH AND THROAT: The oropharynx is clear. The oral mucosa is pink and moist. Hearing is intact to voice. NECK: Left posterior incision healed, no infection. LYMPH NODES:  Lymph nodes in the neck are normal. RESPIRATORY:  Lungs are clear. There is normal respiratory effort, with equal breath sounds bilaterally, and without pathologic use of accessory muscles. CARDIOVASCULAR: Heart is regular without murmurs, gallops, or rubs. GI: The abdomen is  soft, nontender, and nondistended.  Midline laparotomy scar healed and ostomy working, no evidence of hernias are apparent but exam is limited de to pt discomfort.There are no palpable masses. There is no hepatosplenomegaly. There are normal bowel sounds . GU: Rectal deferred.   MUSCULOSKELETAL: Normal muscle strength and tone. No cyanosis or edema.   SKIN: Turgor is good and there are no pathologic skin lesions or ulcers. NEUROLOGIC: Motor and sensation is grossly normal. Cranial nerves are grossly intact. PSYCH:  Oriented to person, place and time. Affect is normal.     Assessment/Plan: 37 year old female status post Hartman's for sigmoid injury.  She has done very well and now is ready for colostomy takedown.  Barium enema did not show any evidence of a stricture and decent length on the stump.  Discussed with the patient in detail about open Hartman's reversal.  Procedure discussed with her in detail.  Risk, benefits and possible complications including but not limited to: Bleeding, infection bowel injuries chronic pain and prolonged hospitalization.  She wishes to proceed.  We will be happy to perform lysis of adhesions on regarding the fallopian tube and ovaries if we see something abnormal on the outside I will be happy to remove the adhesions.  I will not do anything intraluminal or any oophorectomies or any surgeries to the pelvic  organs.  She understands this.  Please note that I spent at least 40 minutes in this encounter including personally reviewing imaging studies, coordinating her care, placing orders and performing appropriate documentation  Caroleen Hamman, MD [place] Surgeon

## 2022-10-07 ENCOUNTER — Encounter
Admission: RE | Admit: 2022-10-07 | Discharge: 2022-10-07 | Disposition: A | Discharge status: Home / Self Care | Payer: No Typology Code available for payment source | Source: Ambulatory Visit | Attending: Surgery | Admitting: Surgery

## 2022-10-07 ENCOUNTER — Other Ambulatory Visit: Payer: Self-pay

## 2022-10-07 VITALS — Ht 63.0 in | Wt 198.0 lb

## 2022-10-07 DIAGNOSIS — E781 Pure hyperglyceridemia: Secondary | ICD-10-CM

## 2022-10-07 DIAGNOSIS — Z01818 Encounter for other preprocedural examination: Secondary | ICD-10-CM

## 2022-10-07 DIAGNOSIS — N179 Acute kidney failure, unspecified: Secondary | ICD-10-CM

## 2022-10-07 DIAGNOSIS — A419 Sepsis, unspecified organism: Secondary | ICD-10-CM

## 2022-10-07 NOTE — Patient Instructions (Signed)
Your procedure is scheduled on: Tuesday 10/15/22 Report to the Registration Desk on the 1st floor of the [facility].. To find out your arrival time please call [phone] between 1PM - 3PM on Monday 10/14/22.  Remember: Instructions that are not followed completely may result in serious medical risk, up to and including death, or upon the discretion of your surgeon and anesthesiologist your surgery may need to be rescheduled.     _X__ 1. Diet as directed by Dr Dahlia Byes  __X__2.  On the morning of surgery brush your teeth with toothpaste and water, you                 may rinse your mouth with mouthwash if you wish.  Do not swallow any              toothpaste of mouthwash.     _X__ 3.  No Alcohol for 24 hours before or after surgery.   _X__ 4.  Do Not Smoke or use e-cigarettes For 24 Hours Prior to Your Surgery.                 Do not use any chewable tobacco products for at least 6 hours prior to                 surgery.  ____  5.  Bring all medications with you on the day of surgery if instructed.   __X__  6.  Notify your doctor if there is any change in your medical condition      (cold, fever, infections).     Do not wear jewelry, make-up, hairpins, clips or nail polish. Do not wear lotions, powders, or perfumes.  Do not shave body hair 48 hours prior to surgery. Men may shave face and neck. Do not bring valuables to the hospital.    [facility] is not responsible for any belongings or valuables.  Contacts, dentures/partials or body piercings may not be worn into surgery. Bring a case for your contacts, glasses or hearing aids, a denture cup will be supplied. Leave your suitcase or overnight bag in the car. After surgery it may be brought to your room. For patients admitted to the hospital, discharge time is determined by your treatment team. In case of increased patient census, it may be necessary for you, the patient, to continue your postoperative care within the Same Day  Surgery department.   Patients discharged the day of surgery will need a responsible, legally licensed driver to drive them home. A learners permit is NOT acceptable. The driver must be free of impairment including anesthesia medications You will not be allowed to drive yourself home. If you are going home via [place], [place], Taxi or other public transportation (non medical) you MUST be accompanied be a responsible individual.    Please read over the following fact sheets that you were given:    CHG soap  Continue taking all prescribed medications with the exception of the following:  One week prior to surgery: Stop Anti-inflammatories (NSAIDS) such as Advil, Aleve, Ibuprofen, Motrin, Naproxen, Naprosyn and Aspirin based products such as Excedrin, Goody's Powder, BC Powder.  Stop ANY OVER THE COUNTER supplements until after surgery.   __X__ Take these medicines the morning of surgery with A SIP OF WATER:    1. NONE  2.   3.     __X__ Use CHG Soap/SAGE wipes as directed       Preparing for Surgery with CHLORHEXIDINE GLUCONATE (CHG) Soap  Chlorhexidine Gluconate (CHG) Soap  o An antiseptic cleaner that kills germs and bonds with the skin to continue killing germs even after washing  o Used for showering the night before surgery and morning of surgery  Before surgery, you can play an important role by reducing the number of germs on your skin.  CHG (Chlorhexidine gluconate) soap is an antiseptic cleanser which kills germs and bonds with the skin to continue killing germs even after washing.  Please do not use if you have an allergy to CHG or antibacterial soaps. If your skin becomes reddened/irritated stop using the CHG.  1. Shower the NIGHT BEFORE SURGERY and the MORNING OF SURGERY with CHG soap.  2. If you choose to wash your hair, wash your hair first as usual with your normal shampoo.  3. After shampooing, rinse your hair and body thoroughly to remove the shampoo.  4.  Use CHG as you would any other liquid soap. You can apply CHG directly to the skin and wash gently with a scrungie or a clean washcloth.  5. Apply the CHG soap to your body only from the neck down. Do not use on open wounds or open sores. Avoid contact with your eyes, ears, mouth, and genitals (private parts). Wash face and genitals (private parts) with your normal soap.  6. Wash thoroughly, paying special attention to the area where your surgery will be performed.  7. Thoroughly rinse your body with warm water.  8. Do not shower/wash with your normal soap after using and rinsing off the CHG soap.  9. Pat yourself dry with a clean towel.  10. Wear clean pajamas to bed the night before surgery.  12. Place clean sheets on your bed the night of your first shower and do not sleep with pets.  13. Shower again with the CHG soap on the day of surgery prior to arriving at the hospital.  14. Do not apply any deodorants/lotions/powders.  15. Please wear clean clothes to the hospital.

## 2022-10-08 ENCOUNTER — Encounter: Payer: Self-pay | Admitting: Urgent Care

## 2022-10-08 ENCOUNTER — Encounter
Admission: RE | Admit: 2022-10-08 | Discharge: 2022-10-08 | Disposition: A | Discharge status: Home / Self Care | Payer: No Typology Code available for payment source | Source: Ambulatory Visit | Attending: Surgery | Admitting: Surgery

## 2022-10-08 DIAGNOSIS — E781 Pure hyperglyceridemia: Secondary | ICD-10-CM

## 2022-10-08 DIAGNOSIS — N179 Acute kidney failure, unspecified: Secondary | ICD-10-CM | POA: Insufficient documentation

## 2022-10-08 DIAGNOSIS — A419 Sepsis, unspecified organism: Secondary | ICD-10-CM | POA: Diagnosis not present

## 2022-10-08 DIAGNOSIS — R6520 Severe sepsis without septic shock: Secondary | ICD-10-CM | POA: Insufficient documentation

## 2022-10-08 DIAGNOSIS — Z01812 Encounter for preprocedural laboratory examination: Secondary | ICD-10-CM | POA: Insufficient documentation

## 2022-10-08 LAB — CBC
HCT: 43.2 % (ref 36.0–46.0)
Hemoglobin: 14.8 g/dL (ref 12.0–15.0)
MCH: 28.6 pg (ref 26.0–34.0)
MCHC: 34.3 g/dL (ref 30.0–36.0)
MCV: 83.4 fL (ref 80.0–100.0)
Platelets: 238 10*3/uL (ref 150–400)
RBC: 5.18 MIL/uL — ABNORMAL HIGH (ref 3.87–5.11)
RDW: 13 % (ref 11.5–15.5)
WBC: 8.7 10*3/uL (ref 4.0–10.5)
nRBC: 0 % (ref 0.0–0.2)

## 2022-10-08 LAB — BASIC METABOLIC PANEL
Anion gap: 10 (ref 5–15)
BUN: 13 mg/dL (ref 6–20)
CO2: 24 mmol/L (ref 22–32)
Calcium: 9.1 mg/dL (ref 8.9–10.3)
Chloride: 102 mmol/L (ref 98–111)
Creatinine, Ser: 0.69 mg/dL (ref 0.44–1.00)
GFR, Estimated: >60 mL/min (ref >60)
Glucose, Bld: 118 mg/dL — ABNORMAL HIGH (ref 70–99)
Potassium: 3.7 mmol/L (ref 3.5–5.1)
Sodium: 136 mmol/L (ref 135–145)

## 2022-10-09 ENCOUNTER — Telehealth: Payer: Self-pay | Admitting: *Deleted

## 2022-10-09 NOTE — Telephone Encounter (Signed)
Faxed LOA/FMLA to [employer] Claims Department at [phone]

## 2022-10-14 NOTE — Anesthesia Preprocedure Evaluation (Addendum)
Anesthesia Evaluation  Patient identified by MRN, date of birth, ID band Patient awake    Reviewed: Allergy & Precautions, H&P , NPO status , Patient's Chart, lab work & pertinent test results  Airway Mallampati: II  TM Distance: >3 FB Neck ROM: full    Dental no notable dental hx.    Pulmonary former smoker   Pulmonary exam normal        Cardiovascular negative cardio ROS Normal cardiovascular exam     Neuro/Psych  Headaches PSYCHIATRIC DISORDERS         GI/Hepatic Neg liver ROS,,, status post Hartman's procedure on March 27, 2022 for iatrogenic sigmoid injury   Endo/Other  negative endocrine ROS    Renal/GU      Musculoskeletal   Abdominal   Peds  Hematology negative hematology ROS (+)   Anesthesia Other Findings Past Medical History: 03/2022: Acute respiratory failure (HCC) No date: AKI (acute kidney injury) ([place])     Comment:  in setting of sepsi, IV contrast, NAGMA-RESOLVED No date: Allergy No date: Anxiety No date: Chicken pox No date: Colostomy in place [facility]) No date: Depression No date: Heart murmur No date: Ileus ([place]) No date: Lyme disease     Comment:  patient reports a remote history of lyme and alludes               that she has "chronic lyme" No date: Migraines     Comment:  MIGRAINES No date: Neck mass No date: Ovarian cyst 07/2017: Pneumonia 03/2022: Sepsis ([place]) No date: Tobacco use  Past Surgical History: 02/16/2018: CHROMOPERTUBATION     Comment:  Procedure: CHROMOPERTUBATION;  Surgeon: Boykin Nearing, MD;  Location: [facility];  Service: Gynecology;; 03/18/2022: Lynnell Chad; Left     Comment:  Procedure: CHROMOPERTUBATION;  Surgeon: Rubie Maid,               MD;  Location: [facility];  Service: Gynecology;                Laterality: Left; 03/27/2022: COLECTOMY WITH COLOSTOMY CREATION/HARTMANN PROCEDURE; N/A     Comment:  Procedure: SIGMOID COLECTOMY  WITH COLOSTOMY               CREATION/HARTMANN PROCEDURE; LYSIS OF ADHESIONS;                Surgeon: Jules Husbands, MD;  Location: [facility];                Service: General;  Laterality: N/A; No date: CYSTECTOMY; Left     Comment:  OVARY 2008: DILATION AND CURETTAGE OF UTERUS 07/25/2022: EXCISION MASS NECK; N/A     Comment:  Procedure: EXCISION MASS NECK, posterior;  Surgeon:               Jules Husbands, MD;  Location: [facility];  Service:               General;  Laterality: N/A; 02/16/2018: LAPAROSCOPIC LYSIS OF ADHESIONS; N/A     Comment:  Procedure: EXTENSIVE LAPAROSCOPIC LYSIS OF ADHESIONS;                Surgeon: Boykin Nearing, MD;  Location: [facility];              Service: Gynecology;  Laterality: N/A; 02/16/2018: LAPAROSCOPY; N/A     Comment:  Procedure: LAPAROSCOPY OPERATIVE;  Surgeon:  Schermerhorn, Gwen Her, MD;  Location: [facility];                Service: Gynecology;  Laterality: N/A; 03/27/2022: LAPAROTOMY; N/A     Comment:  Procedure: EXPLORATORY LAPAROTOMY;  Surgeon: Jules Husbands, MD;  Location: [facility];  Service: General;                Laterality: N/A; 03/18/2022: LYSIS OF ADHESION; N/A     Comment:  Procedure: LYSIS OF ADHESION;  Surgeon: Rubie Maid,               MD;  Location: [facility];  Service: Gynecology;                Laterality: N/A; 03/18/2022: XI ROBOTIC ASSISTED SALPINGECTOMY; Left     Comment:  Procedure: XI ROBOTIC ASSISTED SALPINGECTOMY;  Surgeon:               Rubie Maid, MD;  Location: [facility];  Service:               Gynecology;  Laterality: Left;     Reproductive/Obstetrics negative OB ROS                              Anesthesia Physical Anesthesia Plan  ASA: 2  Anesthesia Plan: General ETT   Post-op Pain Management: Ofirmev IV (intra-op)*, Celebrex PO (pre-op)* and Gabapentin PO (pre-op)*   Induction: Intravenous  PONV Risk Score and Plan: 2 and Ondansetron,  Dexamethasone, Midazolam and Propofol infusion  Airway Management Planned: Oral ETT  Additional Equipment:   Intra-op Plan:   Post-operative Plan: Extubation in OR  Informed Consent:      Dental Advisory Given  Plan Discussed with: CRNA and Surgeon  Anesthesia Plan Comments:          Anesthesia Quick Evaluation

## 2022-10-15 ENCOUNTER — Inpatient Hospital Stay: Payer: No Typology Code available for payment source | Admitting: Anesthesiology

## 2022-10-15 ENCOUNTER — Encounter
Admission: RE | Disposition: A | Discharge status: Home / Self Care | Payer: Self-pay | Source: Home / Self Care | Attending: Surgery

## 2022-10-15 ENCOUNTER — Encounter: Payer: Self-pay | Admitting: Surgery

## 2022-10-15 ENCOUNTER — Inpatient Hospital Stay
Admission: RE | Admit: 2022-10-15 | Discharge: 2022-10-18 | DRG: 337 | Disposition: A | Discharge status: Home / Self Care | Payer: No Typology Code available for payment source | Attending: Surgery | Admitting: Surgery

## 2022-10-15 ENCOUNTER — Other Ambulatory Visit: Payer: Self-pay

## 2022-10-15 DIAGNOSIS — K660 Peritoneal adhesions (postprocedural) (postinfection): Secondary | ICD-10-CM | POA: Diagnosis present

## 2022-10-15 DIAGNOSIS — Z9889 Other specified postprocedural states: Principal | ICD-10-CM

## 2022-10-15 DIAGNOSIS — Z87891 Personal history of nicotine dependence: Secondary | ICD-10-CM | POA: Diagnosis not present

## 2022-10-15 DIAGNOSIS — K389 Disease of appendix, unspecified: Secondary | ICD-10-CM | POA: Diagnosis not present

## 2022-10-15 DIAGNOSIS — E876 Hypokalemia: Secondary | ICD-10-CM | POA: Diagnosis not present

## 2022-10-15 DIAGNOSIS — Z9049 Acquired absence of other specified parts of digestive tract: Secondary | ICD-10-CM | POA: Diagnosis not present

## 2022-10-15 DIAGNOSIS — K435 Parastomal hernia without obstruction or  gangrene: Secondary | ICD-10-CM | POA: Diagnosis present

## 2022-10-15 DIAGNOSIS — Z818 Family history of other mental and behavioral disorders: Secondary | ICD-10-CM | POA: Diagnosis not present

## 2022-10-15 DIAGNOSIS — Z933 Colostomy status: Secondary | ICD-10-CM

## 2022-10-15 DIAGNOSIS — Z01818 Encounter for other preprocedural examination: Secondary | ICD-10-CM

## 2022-10-15 DIAGNOSIS — Z433 Encounter for attention to colostomy: Secondary | ICD-10-CM | POA: Diagnosis present

## 2022-10-15 HISTORY — PX: APPENDECTOMY: SHX54

## 2022-10-15 HISTORY — PX: COLOSTOMY REVERSAL: SHX5782

## 2022-10-15 LAB — CBC
HCT: 42.4 % (ref 36.0–46.0)
Hemoglobin: 14.3 g/dL (ref 12.0–15.0)
MCH: 28.1 pg (ref 26.0–34.0)
MCHC: 33.7 g/dL (ref 30.0–36.0)
MCV: 83.3 fL (ref 80.0–100.0)
Platelets: 265 10*3/uL (ref 150–400)
RBC: 5.09 MIL/uL (ref 3.87–5.11)
RDW: 12.5 % (ref 11.5–15.5)
WBC: 19.4 10*3/uL — ABNORMAL HIGH (ref 4.0–10.5)
nRBC: 0 % (ref 0.0–0.2)

## 2022-10-15 LAB — CREATININE, SERUM
Creatinine, Ser: 0.89 mg/dL (ref 0.44–1.00)
GFR, Estimated: >60 mL/min (ref >60)

## 2022-10-15 LAB — POCT PREGNANCY, URINE: Preg Test, Ur: NEGATIVE

## 2022-10-15 SURGERY — COLOSTOMY REVERSAL
Anesthesia: General

## 2022-10-15 MED ORDER — DIPHENHYDRAMINE HCL 50 MG/ML IJ SOLN: 12.5000 mg | Freq: Four times a day (QID) | INTRAMUSCULAR | Status: DC | PRN

## 2022-10-15 MED ORDER — HYDROMORPHONE HCL 1 MG/ML IJ SOLN
INTRAMUSCULAR | Status: DC | PRN
  Administered 2022-10-15 (×3): .5 mg via INTRAVENOUS

## 2022-10-15 MED ORDER — MIDAZOLAM HCL 2 MG/2ML IJ SOLN
INTRAMUSCULAR | Status: AC
  Filled 2022-10-15: qty 2

## 2022-10-15 MED ORDER — PROMETHAZINE HCL 25 MG/ML IJ SOLN: 6.2500 mg | INTRAMUSCULAR | Status: DC | PRN

## 2022-10-15 MED ORDER — PHENYLEPHRINE 80 MCG/ML (10ML) SYRINGE FOR IV PUSH (FOR BLOOD PRESSURE SUPPORT)
PREFILLED_SYRINGE | INTRAVENOUS | Status: AC
  Filled 2022-10-15: qty 10

## 2022-10-15 MED ORDER — FAMOTIDINE 20 MG PO TABS
ORAL_TABLET | ORAL | Status: AC
  Administered 2022-10-15: 20 mg
  Filled 2022-10-15: qty 1

## 2022-10-15 MED ORDER — CHLORHEXIDINE GLUCONATE CLOTH 2 % EX PADS: 6.0000 | MEDICATED_PAD | Freq: Once | CUTANEOUS | Status: DC

## 2022-10-15 MED ORDER — MIDAZOLAM HCL 2 MG/2ML IJ SOLN
INTRAMUSCULAR | Status: DC | PRN
  Administered 2022-10-15: 2 mg via INTRAVENOUS

## 2022-10-15 MED ORDER — ACETAMINOPHEN 10 MG/ML IV SOLN
INTRAVENOUS | Status: DC | PRN
  Administered 2022-10-15: 1000 mg via INTRAVENOUS

## 2022-10-15 MED ORDER — ALBUMIN HUMAN 5 % IV SOLN: INTRAVENOUS | Status: DC | PRN

## 2022-10-15 MED ORDER — ONDANSETRON HCL 4 MG/2ML IJ SOLN
4.0000 mg | Freq: Four times a day (QID) | INTRAMUSCULAR | Status: DC | PRN
  Filled 2022-10-15: qty 2

## 2022-10-15 MED ORDER — METHOCARBAMOL 1000 MG/10ML IJ SOLN: 500.0000 mg | Freq: Three times a day (TID) | INTRAVENOUS | Status: DC | PRN

## 2022-10-15 MED ORDER — SODIUM CHLORIDE 0.9 % IV SOLN
INTRAVENOUS | Status: AC
  Administered 2022-10-15: 2 g via INTRAVENOUS
  Filled 2022-10-15: qty 2

## 2022-10-15 MED ORDER — MELATONIN 5 MG PO TABS: 5.0000 mg | ORAL_TABLET | Freq: Every evening | ORAL | Status: DC | PRN

## 2022-10-15 MED ORDER — PROPOFOL 10 MG/ML IV BOLUS
INTRAVENOUS | Status: DC | PRN
  Administered 2022-10-15: 40 mg via INTRAVENOUS
  Administered 2022-10-15: 160 mg via INTRAVENOUS

## 2022-10-15 MED ORDER — ROCURONIUM BROMIDE 10 MG/ML (PF) SYRINGE
PREFILLED_SYRINGE | INTRAVENOUS | Status: AC
  Filled 2022-10-15: qty 10

## 2022-10-15 MED ORDER — CELECOXIB 200 MG PO CAPS
ORAL_CAPSULE | ORAL | Status: AC
  Administered 2022-10-15: 200 mg via ORAL
  Filled 2022-10-15: qty 1

## 2022-10-15 MED ORDER — LACTATED RINGERS IV SOLN: INTRAVENOUS | Status: DC | PRN

## 2022-10-15 MED ORDER — PROCHLORPERAZINE EDISYLATE 10 MG/2ML IJ SOLN: 5.0000 mg | Freq: Four times a day (QID) | INTRAMUSCULAR | Status: DC | PRN

## 2022-10-15 MED ORDER — ACETAMINOPHEN 10 MG/ML IV SOLN
INTRAVENOUS | Status: AC
  Filled 2022-10-15: qty 100

## 2022-10-15 MED ORDER — ORAL CARE MOUTH RINSE: 15.0000 mL | Freq: Once | OROMUCOSAL | Status: AC

## 2022-10-15 MED ORDER — BUPIVACAINE LIPOSOME 1.3 % IJ SUSP
INTRAMUSCULAR | Status: AC
  Filled 2022-10-15: qty 20

## 2022-10-15 MED ORDER — FENTANYL CITRATE (PF) 100 MCG/2ML IJ SOLN
INTRAMUSCULAR | Status: DC | PRN
  Administered 2022-10-15: 100 ug via INTRAVENOUS

## 2022-10-15 MED ORDER — GABAPENTIN 300 MG PO CAPS: 300.0000 mg | ORAL_CAPSULE | ORAL | Status: AC

## 2022-10-15 MED ORDER — MORPHINE SULFATE (PF) 2 MG/ML IV SOLN
2.0000 mg | INTRAVENOUS | Status: DC | PRN
  Administered 2022-10-15: 2 mg via INTRAVENOUS
  Filled 2022-10-15: qty 1

## 2022-10-15 MED ORDER — KETOROLAC TROMETHAMINE 30 MG/ML IJ SOLN
30.0000 mg | Freq: Four times a day (QID) | INTRAMUSCULAR | Status: DC
  Administered 2022-10-15 – 2022-10-18 (×12): 30 mg via INTRAVENOUS
  Filled 2022-10-15 (×12): qty 1

## 2022-10-15 MED ORDER — SODIUM CHLORIDE 0.9 % IV SOLN: INTRAVENOUS | Status: DC

## 2022-10-15 MED ORDER — SODIUM CHLORIDE 0.9 % IV SOLN
INTRAVENOUS | Status: AC
  Filled 2022-10-15: qty 2

## 2022-10-15 MED ORDER — LIDOCAINE HCL (CARDIAC) PF 100 MG/5ML IV SOSY
PREFILLED_SYRINGE | INTRAVENOUS | Status: DC | PRN
  Administered 2022-10-15: 40 mg via INTRAVENOUS

## 2022-10-15 MED ORDER — ALVIMOPAN 12 MG PO CAPS
12.0000 mg | ORAL_CAPSULE | ORAL | Status: AC
  Administered 2022-10-16: 12 mg via ORAL
  Filled 2022-10-15 (×2): qty 1

## 2022-10-15 MED ORDER — DEXMEDETOMIDINE HCL IN NACL 80 MCG/20ML IV SOLN
INTRAVENOUS | Status: AC
  Filled 2022-10-15: qty 20

## 2022-10-15 MED ORDER — SUGAMMADEX SODIUM 200 MG/2ML IV SOLN
INTRAVENOUS | Status: DC | PRN
  Administered 2022-10-15: 200 mg via INTRAVENOUS

## 2022-10-15 MED ORDER — ROCURONIUM BROMIDE 100 MG/10ML IV SOLN
INTRAVENOUS | Status: DC | PRN
  Administered 2022-10-15: 30 mg via INTRAVENOUS
  Administered 2022-10-15 (×2): 10 mg via INTRAVENOUS
  Administered 2022-10-15: 20 mg via INTRAVENOUS
  Administered 2022-10-15: 30 mg via INTRAVENOUS
  Administered 2022-10-15: 20 mg via INTRAVENOUS

## 2022-10-15 MED ORDER — LIDOCAINE HCL (PF) 2 % IJ SOLN
INTRAMUSCULAR | Status: AC
  Filled 2022-10-15: qty 5

## 2022-10-15 MED ORDER — PREGABALIN 50 MG PO CAPS
100.0000 mg | ORAL_CAPSULE | Freq: Three times a day (TID) | ORAL | Status: DC
  Administered 2022-10-15 – 2022-10-18 (×9): 100 mg via ORAL
  Filled 2022-10-15 (×9): qty 2

## 2022-10-15 MED ORDER — SEPRAFILM FOR OPTIME
ORAL_FILM | TOPICAL | Status: DC | PRN
  Administered 2022-10-15: 2 via TOPICAL

## 2022-10-15 MED ORDER — CHLORHEXIDINE GLUCONATE 0.12 % MT SOLN: 15.0000 mL | Freq: Once | OROMUCOSAL | Status: AC

## 2022-10-15 MED ORDER — ONDANSETRON 4 MG PO TBDP: 4.0000 mg | ORAL_TABLET | Freq: Four times a day (QID) | ORAL | Status: DC | PRN

## 2022-10-15 MED ORDER — OXYCODONE HCL 5 MG PO TABS
5.0000 mg | ORAL_TABLET | ORAL | Status: DC | PRN
  Administered 2022-10-15 – 2022-10-16 (×2): 5 mg via ORAL
  Administered 2022-10-16 (×2): 10 mg via ORAL
  Administered 2022-10-16: 5 mg via ORAL
  Administered 2022-10-17 (×2): 10 mg via ORAL
  Administered 2022-10-17 (×2): 5 mg via ORAL
  Administered 2022-10-17 – 2022-10-18 (×4): 10 mg via ORAL
  Filled 2022-10-15 (×2): qty 2
  Filled 2022-10-15: qty 1
  Filled 2022-10-15: qty 2
  Filled 2022-10-15: qty 1
  Filled 2022-10-15: qty 2
  Filled 2022-10-15: qty 1
  Filled 2022-10-15 (×2): qty 2
  Filled 2022-10-15: qty 1
  Filled 2022-10-15: qty 2
  Filled 2022-10-15: qty 1
  Filled 2022-10-15 (×3): qty 2

## 2022-10-15 MED ORDER — SODIUM CHLORIDE 0.9 % IV SOLN
2.0000 g | INTRAVENOUS | Status: AC
  Administered 2022-10-15: 2 g via INTRAVENOUS

## 2022-10-15 MED ORDER — ENOXAPARIN SODIUM 40 MG/0.4ML IJ SOSY
40.0000 mg | PREFILLED_SYRINGE | INTRAMUSCULAR | Status: DC
  Administered 2022-10-16 – 2022-10-18 (×3): 40 mg via SUBCUTANEOUS
  Filled 2022-10-15 (×3): qty 0.4

## 2022-10-15 MED ORDER — HYDROMORPHONE HCL 1 MG/ML IJ SOLN: 0.2500 mg | INTRAMUSCULAR | Status: DC | PRN

## 2022-10-15 MED ORDER — CELECOXIB 200 MG PO CAPS: 200.0000 mg | ORAL_CAPSULE | ORAL | Status: AC

## 2022-10-15 MED ORDER — DEXMEDETOMIDINE HCL IN NACL 200 MCG/50ML IV SOLN
INTRAVENOUS | Status: DC | PRN
  Administered 2022-10-15 (×2): 8 ug via INTRAVENOUS

## 2022-10-15 MED ORDER — PROPOFOL 500 MG/50ML IV EMUL
INTRAVENOUS | Status: DC | PRN
  Administered 2022-10-15: 30 ug/kg/min via INTRAVENOUS

## 2022-10-15 MED ORDER — 0.9 % SODIUM CHLORIDE (POUR BTL) OPTIME
TOPICAL | Status: DC | PRN
  Administered 2022-10-15: 1000 mL

## 2022-10-15 MED ORDER — DIPHENHYDRAMINE HCL 12.5 MG/5ML PO ELIX: 12.5000 mg | ORAL_SOLUTION | Freq: Four times a day (QID) | ORAL | Status: DC | PRN

## 2022-10-15 MED ORDER — FENTANYL CITRATE (PF) 100 MCG/2ML IJ SOLN
INTRAMUSCULAR | Status: AC
  Filled 2022-10-15: qty 2

## 2022-10-15 MED ORDER — HYDROMORPHONE HCL 1 MG/ML IJ SOLN
INTRAMUSCULAR | Status: AC
  Filled 2022-10-15: qty 1

## 2022-10-15 MED ORDER — LACTATED RINGERS IV SOLN: INTRAVENOUS | Status: DC

## 2022-10-15 MED ORDER — ONDANSETRON HCL 4 MG/2ML IJ SOLN
INTRAMUSCULAR | Status: DC | PRN
  Administered 2022-10-15: 4 mg via INTRAVENOUS

## 2022-10-15 MED ORDER — ALBUMIN HUMAN 5 % IV SOLN
INTRAVENOUS | Status: AC
  Filled 2022-10-15: qty 250

## 2022-10-15 MED ORDER — FAMOTIDINE 20 MG PO TABS
20.0000 mg | ORAL_TABLET | Freq: Once | ORAL | Status: AC
  Filled 2022-10-15: qty 1

## 2022-10-15 MED ORDER — CHLORHEXIDINE GLUCONATE 0.12 % MT SOLN
OROMUCOSAL | Status: AC
  Administered 2022-10-15: 15 mL via OROMUCOSAL
  Filled 2022-10-15: qty 15

## 2022-10-15 MED ORDER — SODIUM CHLORIDE (PF) 0.9 % IJ SOLN
INTRAMUSCULAR | Status: AC
  Filled 2022-10-15: qty 50

## 2022-10-15 MED ORDER — PROPOFOL 1000 MG/100ML IV EMUL
INTRAVENOUS | Status: AC
  Filled 2022-10-15: qty 100

## 2022-10-15 MED ORDER — DROPERIDOL 2.5 MG/ML IJ SOLN: 0.6250 mg | Freq: Once | INTRAMUSCULAR | Status: DC | PRN

## 2022-10-15 MED ORDER — PANTOPRAZOLE SODIUM 40 MG IV SOLR
40.0000 mg | Freq: Every day | INTRAVENOUS | Status: DC
  Administered 2022-10-15 – 2022-10-17 (×3): 40 mg via INTRAVENOUS
  Filled 2022-10-15 (×3): qty 10

## 2022-10-15 MED ORDER — PROPOFOL 10 MG/ML IV BOLUS
INTRAVENOUS | Status: AC
  Filled 2022-10-15: qty 20

## 2022-10-15 MED ORDER — ACETAMINOPHEN 10 MG/ML IV SOLN: 1000.0000 mg | Freq: Once | INTRAVENOUS | Status: DC | PRN

## 2022-10-15 MED ORDER — PROCHLORPERAZINE MALEATE 10 MG PO TABS: 10.0000 mg | ORAL_TABLET | Freq: Four times a day (QID) | ORAL | Status: DC | PRN

## 2022-10-15 MED ORDER — SODIUM CHLORIDE 0.9 % IV SOLN
INTRAVENOUS | Status: DC | PRN
  Administered 2022-10-15: 70 mL

## 2022-10-15 MED ORDER — PHENYLEPHRINE HCL (PRESSORS) 10 MG/ML IV SOLN
INTRAVENOUS | Status: DC | PRN
  Administered 2022-10-15 (×6): 160 ug via INTRAVENOUS
  Administered 2022-10-15 (×2): 80 ug via INTRAVENOUS
  Administered 2022-10-15: 160 ug via INTRAVENOUS
  Administered 2022-10-15: 80 ug via INTRAVENOUS
  Administered 2022-10-15: 60 ug via INTRAVENOUS
  Administered 2022-10-15: 160 ug via INTRAVENOUS

## 2022-10-15 MED ORDER — ALVIMOPAN 12 MG PO CAPS
12.0000 mg | ORAL_CAPSULE | ORAL | Status: AC
  Administered 2022-10-15: 12 mg via ORAL

## 2022-10-15 MED ORDER — BUPIVACAINE HCL (PF) 0.25 % IJ SOLN
INTRAMUSCULAR | Status: AC
  Filled 2022-10-15: qty 30

## 2022-10-15 MED ORDER — ONDANSETRON HCL 4 MG/2ML IJ SOLN
INTRAMUSCULAR | Status: AC
  Filled 2022-10-15: qty 2

## 2022-10-15 MED ORDER — ALVIMOPAN 12 MG PO CAPS
ORAL_CAPSULE | ORAL | Status: AC
  Filled 2022-10-15: qty 1

## 2022-10-15 MED ORDER — DEXAMETHASONE SODIUM PHOSPHATE 10 MG/ML IJ SOLN
INTRAMUSCULAR | Status: DC | PRN
  Administered 2022-10-15: 5 mg via INTRAVENOUS

## 2022-10-15 MED ORDER — ACETAMINOPHEN 500 MG PO TABS
1000.0000 mg | ORAL_TABLET | Freq: Four times a day (QID) | ORAL | Status: DC
  Administered 2022-10-15 – 2022-10-18 (×10): 1000 mg via ORAL
  Filled 2022-10-15 (×12): qty 2

## 2022-10-15 MED ORDER — DEXAMETHASONE SODIUM PHOSPHATE 10 MG/ML IJ SOLN
INTRAMUSCULAR | Status: AC
  Filled 2022-10-15: qty 1

## 2022-10-15 MED ORDER — BUPIVACAINE HCL 0.25 % IJ SOLN
INTRAMUSCULAR | Status: DC | PRN
  Administered 2022-10-15: 30 mL

## 2022-10-15 MED ORDER — METHOCARBAMOL 500 MG PO TABS
500.0000 mg | ORAL_TABLET | Freq: Three times a day (TID) | ORAL | Status: DC | PRN
  Administered 2022-10-17 – 2022-10-18 (×2): 500 mg via ORAL
  Filled 2022-10-15 (×2): qty 1

## 2022-10-15 MED ORDER — GABAPENTIN 300 MG PO CAPS
ORAL_CAPSULE | ORAL | Status: AC
  Administered 2022-10-15: 300 mg via ORAL
  Filled 2022-10-15: qty 1

## 2022-10-15 MED ORDER — SODIUM CHLORIDE 0.9 % IV SOLN
2.0000 g | Freq: Two times a day (BID) | INTRAVENOUS | Status: AC
  Administered 2022-10-15: 2 g via INTRAVENOUS
  Filled 2022-10-15: qty 2

## 2022-10-15 SURGICAL SUPPLY — 59 items
APPLIER CLIP 11 MED OPEN (CLIP)
APPLIER CLIP 13 LRG OPEN (CLIP)
BARRIER ADH SEPRAFILM 3INX5IN (MISCELLANEOUS) IMPLANT
BLADE CLIPPER SURG (BLADE) ×2 IMPLANT
CATH URET ROBINSON 16FR STRL (CATHETERS) ×2 IMPLANT
CHLORAPREP W/TINT 26 (MISCELLANEOUS) ×2 IMPLANT
CLIP APPLIE 11 MED OPEN (CLIP) IMPLANT
CLIP APPLIE 13 LRG OPEN (CLIP) IMPLANT
DRAPE LAPAROTOMY 100X77 ABD (DRAPES) ×2 IMPLANT
DRAPE LEGGINS SURG 28X43 STRL (DRAPES) IMPLANT
DRAPE UNDER BUTTOCK W/FLU (DRAPES) IMPLANT
DRSG OPSITE POSTOP 4X6 (GAUZE/BANDAGES/DRESSINGS) ×2 IMPLANT
DRSG TELFA 3X4 N-ADH STERILE (GAUZE/BANDAGES/DRESSINGS) ×2 IMPLANT
DRSG TELFA 3X8 NADH STRL (GAUZE/BANDAGES/DRESSINGS) ×2 IMPLANT
ELECT BLADE 6.5 EXT (BLADE) ×2 IMPLANT
ELECT CAUTERY BLADE 6.4 (BLADE) ×2 IMPLANT
ELECT REM PT RETURN 9FT ADLT (ELECTROSURGICAL) ×2
ELECTRODE REM PT RTRN 9FT ADLT (ELECTROSURGICAL) ×2 IMPLANT
GAUZE 4X4 16PLY ~~LOC~~+RFID DBL (SPONGE) ×2 IMPLANT
GAUZE SPONGE 4X4 12PLY STRL (GAUZE/BANDAGES/DRESSINGS) ×2 IMPLANT
GLOVE BIO SURGEON STRL SZ7 (GLOVE) ×8 IMPLANT
GOWN STRL REUS W/ TWL LRG LVL3 (GOWN DISPOSABLE) ×12 IMPLANT
GOWN STRL REUS W/TWL LRG LVL3 (GOWN DISPOSABLE) ×20
HANDLE SUCTION POOLE (INSTRUMENTS) ×2 IMPLANT
LIGASURE IMPACT 36 18CM CVD LR (INSTRUMENTS) IMPLANT
MANIFOLD NEPTUNE II (INSTRUMENTS) ×2 IMPLANT
NEEDLE HYPO 22GX1.5 SAFETY (NEEDLE) ×2 IMPLANT
NS IRRIG 1000ML POUR BTL (IV SOLUTION) ×2 IMPLANT
PACK BASIN MAJOR ARMC (MISCELLANEOUS) ×2 IMPLANT
PACK COLON CLEAN CLOSURE (MISCELLANEOUS) ×2 IMPLANT
RELOAD PROXIMATE 75MM BLUE (ENDOMECHANICALS) IMPLANT
RELOAD STAPLE 75 3.8 BLU REG (ENDOMECHANICALS) IMPLANT
RETRACTOR WND ALEXIS-O 25 LRG (MISCELLANEOUS) IMPLANT
RTRCTR WOUND ALEXIS O 25CM LRG (MISCELLANEOUS) ×2
SOL PREP PVP 2OZ (MISCELLANEOUS) ×2
SOLUTION PREP PVP 2OZ (MISCELLANEOUS) ×2 IMPLANT
SPONGE T-LAP 18X18 ~~LOC~~+RFID (SPONGE) ×8 IMPLANT
SPONGE T-LAP 18X36 ~~LOC~~+RFID STR (SPONGE) IMPLANT
STAPLER CIRCULAR MANUAL XL 25 (STAPLE) IMPLANT
STAPLER PROXIMATE 75MM BLUE (STAPLE) IMPLANT
STAPLER SKIN PROX 35W (STAPLE) ×2 IMPLANT
SUCTION POOLE HANDLE (INSTRUMENTS)
SURGILUBE 2OZ TUBE FLIPTOP (MISCELLANEOUS) ×2 IMPLANT
SUT PDS AB 0 CT1 27 (SUTURE) ×8 IMPLANT
SUT PDS AB 2-0 CT1 27 (SUTURE) IMPLANT
SUT SILK 2 0 (SUTURE) ×2
SUT SILK 2 0SH CR/8 30 (SUTURE) ×2 IMPLANT
SUT SILK 2-0 18XBRD TIE 12 (SUTURE) ×2 IMPLANT
SUT VIC AB 2-0 SH 27 (SUTURE) ×4
SUT VIC AB 2-0 SH 27XBRD (SUTURE) IMPLANT
SUT VIC AB 3-0 SH 27 (SUTURE)
SUT VIC AB 3-0 SH 27X BRD (SUTURE) IMPLANT
SYR 20ML LL LF (SYRINGE) ×4 IMPLANT
SYR TOOMEY 50ML (SYRINGE) ×2 IMPLANT
SYR TOOMEY IRRIG 70ML (MISCELLANEOUS) ×2
SYRINGE TOOMEY IRRIG 70ML (MISCELLANEOUS) IMPLANT
TRAP FLUID SMOKE EVACUATOR (MISCELLANEOUS) ×2 IMPLANT
TRAY FOLEY MTR SLVR 16FR STAT (SET/KITS/TRAYS/PACK) ×2 IMPLANT
WATER STERILE IRR 500ML POUR (IV SOLUTION) ×2 IMPLANT

## 2022-10-15 NOTE — Op Note (Signed)
PROCEDURES: Hartmann's reversal using 25 mm EEA stapler 2.   Open takedown of splenic flexure 3.   Appendectomy 4.   Parastomal hernia repair   Pre-operative Diagnosis:colostomy in place  Post-operative Diagnosis: same  Surgeon: Tijeras   Assistants: Otho Ket PA-C  Anesthesia: General endotracheal anesthesia  ASA Class: 2  Surgeon: Caroleen Hamman , MD FACS  Anesthesia: Gen. with endotracheal tube  Findings: Extensive adhesion from the abdominal wall to the omentum and bowel Dense adhesions within the pelvis and the appendix Tension free anastomosis without intra-op anastomotic leak with excellent perfusion 3 cm parastomal hernia  Estimated Blood Loss: 50cc              Specimens: colostomy and doughnuts          Complications: none        Condition: stable  Procedure Details  The patient was seen again in the Holding Room. The benefits, complications, treatment options, and expected outcomes were discussed with the patient. The risks of bleeding, infection, recurrence of symptoms, failure to resolve symptoms,  bowel injury, any of which could require further surgery were reviewed with the patient.   The patient was taken to Operating Room, identified as Lacey Harrison and the procedure verified.  A Time Out was held and the above information confirmed.  Prior to the induction of general anesthesia, antibiotic prophylaxis was administered. VTE prophylaxis was in place. General endotracheal anesthesia was then administered and tolerated well. After the induction, the abdomen was prepped with Chloraprep and draped in the sterile fashion. The patient was positioned in lithotomy position.Using a 2-0 silk as a pursestring the end colostomy was closed.  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 Sac excised and the omentum was divided from the stoma. I was  able to dissect the end colostomy from the abdominal wall. Attention was turned to the midline where a Generous midline laparotomy incision was created excising prior scar tissue. The fascia was incised and the abdominal cavity entered under direct visualization . No injuries were encountered. Extensive adhesion encountered and they were lyses with metz and cautery where appropriate. We spent at least 120 minutes of total operative time lysing adhesion an restoring the anatomy.   There was significant adhesive disease in the pelvis from the sigmoid to the pelvic wall and also from the rectum the uterus. T We preserved the ureter at all times. We were also able to mobilize the splenic flexure using electrocautery in  the standard fashion.  We opened the end colostomy and measure the diameter of the bowel. A 25 mm dilator was perfect size. A pursestring 2-0 PDS was used after inserting the anvil device. Mr Olean Ree [facility]  was able to pass a 25 mm standard EEA stapler device through the anus and passed the device through the end of the rectal stump. Under direct visualization we performed an end to end anastomosis with the EEA device. A leak test was performed inflating the colon with a Toomey syringe and a rubber catheter. No evidence of leak was observed. There was also adequate hemostasis.  Attention was turned to the RLQ where the appendix had significant adhesions and could potentially create a problem post op . I decided to perform and appendectomy after dividing the appendix w the ligasure device and 75 mm stapler used to divide the appendix. A second look showed no evidence of  any bleeding or any other injuries. We changed gloves and place a new tray to close the   abdomen.    The parastomal hernia was closed in a two layer fashion w multiple interrupted 0 PDS sutures.  Seprafilm was placed to prevent adhesions. Midline was closed with a -0 PDS suture in a running fashion using small bite technique.  The skin was closed with staples. Liposomal Marcaine  was injected on all incision sites under direct visualization. Needle and laparotomy count were correct and there were no immediate occasions Mr Freda Jackson was required due to the complexity of the case , for exposure and to create anastomosis.  Caroleen Hamman, MD, FACS

## 2022-10-15 NOTE — Interval H&P Note (Signed)
History and Physical Interval Note:  10/15/2022 7:22 AM  Lacey Harrison  has presented today for surgery, with the diagnosis of colostomy in place, Z93.3.  The various methods of treatment have been discussed with the patient and family. After consideration of risks, benefits and other options for treatment, the patient has consented to  Procedure(s): COLOSTOMY REVERSAL-RNFA to assist (N/A) as a surgical intervention.  The patient's history has been reviewed, patient examined, no change in status, stable for surgery.  I have reviewed the patient's chart and labs.  Questions were answered to the patient's satisfaction.     [place]

## 2022-10-15 NOTE — Transfer of Care (Signed)
Immediate Anesthesia Transfer of Care Note  Patient: Lacey Harrison  Procedure(s) Performed: COLOSTOMY REVERSAL-RNFA to assist APPENDECTOMY  Patient Location: PACU  Anesthesia Type:General  Level of Consciousness: drowsy  Airway & Oxygen Therapy: Patient Spontanous Breathing and Patient connected to nasal cannula oxygen  Post-op Assessment: Report given to RN, Post -op Vital signs reviewed and stable, and Patient moving all extremities  Post vital signs: Reviewed and stable  Last Vitals:  Vitals Value Taken Time  BP    Temp    Pulse 89 10/15/22 1220  Resp 15 10/15/22 1220  SpO2 88 % 10/15/22 1220  Vitals shown include unvalidated device data.  Last Pain:  Vitals:   10/15/22 0630  TempSrc: Oral  PainSc: 0-No pain         Complications: No notable events documented.

## 2022-10-15 NOTE — Anesthesia Procedure Notes (Signed)
Procedure Name: Intubation Date/Time: 10/15/2022 7:40 AM  Performed by: Hilbert Odor, CRNAPre-anesthesia Checklist: Patient identified, Patient being monitored, Timeout performed, Emergency Drugs available and Suction available Patient Re-evaluated:Patient Re-evaluated prior to induction Oxygen Delivery Method: Circle system utilized Preoxygenation: Pre-oxygenation with 100% oxygen Induction Type: IV induction Ventilation: Mask ventilation without difficulty and Oral airway inserted - appropriate to patient size Laryngoscope Size: 3 and McGraph Grade View: Grade I Tube type: Oral Tube size: 7.5 mm Number of attempts: 1 Airway Equipment and Method: Stylet Placement Confirmation: ETT inserted through vocal cords under direct vision, positive ETCO2 and breath sounds checked- equal and bilateral Secured at: 21 cm Tube secured with: Tape Dental Injury: Teeth and Oropharynx as per pre-operative assessment

## 2022-10-15 NOTE — Anesthesia Postprocedure Evaluation (Signed)
Anesthesia Post Note  Patient: Lacey Harrison  Procedure(s) Performed: COLOSTOMY REVERSAL-RNFA to assist APPENDECTOMY  Patient location during evaluation: PACU Anesthesia Type: General Level of consciousness: awake and alert Pain management: pain level controlled Vital Signs Assessment: post-procedure vital signs reviewed and stable Respiratory status: spontaneous breathing, nonlabored ventilation and respiratory function stable Cardiovascular status: blood pressure returned to baseline and stable Postop Assessment: no apparent nausea or vomiting Anesthetic complications: no   No notable events documented.   Last Vitals:  Vitals:   10/15/22 1300 10/15/22 1405  BP: 104/65 (!) 99/58  Pulse: 78 93  Resp: 16 17  Temp: 37 C 36.8 C  SpO2: 96% 95%    Last Pain:  Vitals:   10/15/22 1245  TempSrc:   PainSc: 0-No pain                 [place] Ouch

## 2022-10-15 NOTE — TOC Initial Note (Signed)
Transition of Care [facility]) - Initial/Assessment Note    Patient Details  Name: Lacey Harrison MRN: [id number] Date of Birth: 10/10/1985  [facility] of [facility]) CM/SW Contact:    Beverly Sessions, RN Phone Number: 10/15/2022, 3:22 PM  Clinical Narrative:                     Transition of Care [facility]) Screening Note   Patient Details  Name: Lacey Harrison Date of Birth: 1985-12-05   Transition of Care [facility]) CM/SW Contact:    Beverly Sessions, RN Phone Number: 10/15/2022, 3:22 PM    Transition of Care Department [facility]) has reviewed patient and no TOC needs have been identified at this time. We will continue to monitor patient advancement through interdisciplinary progression rounds. If new patient transition needs arise, please place a TOC consult.       Patient Goals and CMS Choice            Expected Discharge Plan and Services                                              Prior Living Arrangements/Services                       Activities of Daily Living Home Assistive Devices/Equipment: None ADL Screening (condition at time of admission) Patient's cognitive ability adequate to safely complete daily activities?: No Is the patient deaf or have difficulty hearing?: No Does the patient have difficulty seeing, even when wearing glasses/contacts?: No Does the patient have difficulty concentrating, remembering, or making decisions?: No Patient able to express need for assistance with ADLs?: Yes Does the patient have difficulty dressing or bathing?: Yes Independently performs ADLs?: Yes (appropriate for developmental age) Does the patient have difficulty walking or climbing stairs?: Yes Weakness of Legs: None Weakness of Arms/Hands: None  Permission Sought/Granted                  Emotional Assessment              Admission diagnosis:  S/P colostomy takedown [Z98.890] Patient Active Problem List   Diagnosis Date Noted    Colostomy in place ([place]) 10/15/2022   S/P colostomy takedown 10/15/2022   Endometritis 08/12/2022   Neck mass 07/25/2022   Hypertriglyceridemia 04/19/2022   Pelvic abscess in female    Abdominal pain 03/26/2022   Acute respiratory failure with hypoxia ([place]) in seting of MSKpain, atelectasis, severe sepsis  03/25/2022   Severe sepsis ([place]) without septic shock, d/t postoperative abscess, s/p pelvic drain placement  03/25/2022   Ileus, postoperative ([place]) 03/25/2022   AKI (acute kidney injury) ([place]) in setting of sepsis, IV contrast, NAGMA - RESOLVED  03/25/2022   Postoperative intra-abdominal abscess 03/21/2022   Hydrosalpinx    Infertility, female    Pelvic adhesive disease    History of ovarian cyst 11/21/2020   Menstrual irregularity 11/21/2020   Vitamin D deficiency 11/21/2020   Screening for blood or protein in urine 11/21/2020   Fatigue 11/21/2020   Swollen lymph nodes 11/21/2020   Otitis externa 11/21/2020   Seasonal allergies 11/21/2020   Tobacco use 07/12/2019   Lyme disease 02/02/2019   Migraines 02/02/2019   Blepharitis 12/25/2018   Depression 03/07/2016   PCP:  Teodora Medici, DO Pharmacy:   [id number] [address] [place] -  [place] [address] Phone: [phone] Fax: [phone]     Social Determinants of Health (SDOH) Social History: SDOH Screenings   Alcohol Screen: Low Risk  (11/21/2020)  Depression (PHQ2-9): High Risk (05/13/2022)  Tobacco Use: High Risk (10/15/2022)   SDOH Interventions:     Readmission Risk Interventions     No data to display

## 2022-10-16 LAB — CBC
HCT: 37.5 % (ref 36.0–46.0)
Hemoglobin: 12.4 g/dL (ref 12.0–15.0)
MCH: 28.2 pg (ref 26.0–34.0)
MCHC: 33.1 g/dL (ref 30.0–36.0)
MCV: 85.2 fL (ref 80.0–100.0)
Platelets: 239 10*3/uL (ref 150–400)
RBC: 4.4 MIL/uL (ref 3.87–5.11)
RDW: 13.1 % (ref 11.5–15.5)
WBC: 10 10*3/uL (ref 4.0–10.5)
nRBC: 0 % (ref 0.0–0.2)

## 2022-10-16 LAB — BASIC METABOLIC PANEL
Anion gap: 9 (ref 5–15)
BUN: 8 mg/dL (ref 6–20)
CO2: 24 mmol/L (ref 22–32)
Calcium: 7.9 mg/dL — ABNORMAL LOW (ref 8.9–10.3)
Chloride: 105 mmol/L (ref 98–111)
Creatinine, Ser: 0.86 mg/dL (ref 0.44–1.00)
GFR, Estimated: >60 mL/min (ref >60)
Glucose, Bld: 115 mg/dL — ABNORMAL HIGH (ref 70–99)
Potassium: 3.3 mmol/L — ABNORMAL LOW (ref 3.5–5.1)
Sodium: 138 mmol/L (ref 135–145)

## 2022-10-16 LAB — SURGICAL PATHOLOGY

## 2022-10-16 MED ORDER — ADULT MULTIVITAMIN W/MINERALS CH
1.0000 | ORAL_TABLET | Freq: Every day | ORAL | Status: DC
  Administered 2022-10-17 – 2022-10-18 (×2): 1 via ORAL
  Filled 2022-10-16 (×2): qty 1

## 2022-10-16 MED ORDER — BOOST / RESOURCE BREEZE PO LIQD CUSTOM
1.0000 | Freq: Three times a day (TID) | ORAL | Status: DC
  Administered 2022-10-16 – 2022-10-17 (×3): 1 via ORAL

## 2022-10-16 MED ORDER — POTASSIUM CHLORIDE CRYS ER 20 MEQ PO TBCR
20.0000 meq | EXTENDED_RELEASE_TABLET | Freq: Two times a day (BID) | ORAL | Status: DC
  Administered 2022-10-16 – 2022-10-18 (×5): 20 meq via ORAL
  Filled 2022-10-16 (×5): qty 1

## 2022-10-16 NOTE — Progress Notes (Signed)
Initial Nutrition Assessment  DOCUMENTATION CODES:   Obesity unspecified  INTERVENTION:   Boost Breeze po TID, each supplement provides 250 kcal and 9 grams of protein  MVI po daily   Pt at mild to moderate refeed risk; recommend monitor potassium, magnesium and phosphorus labs daily until stable  Daily weights   Diet education   NUTRITION DIAGNOSIS:   Increased nutrient needs related to post-op healing as evidenced by estimated needs.  GOAL:   Patient will meet greater than or equal to 90% of their needs  MONITOR:   Diet advancement, Labs, Weight trends, PO intake, Supplement acceptance, I & O's, Skin  REASON FOR ASSESSMENT:   Malnutrition Screening Tool    ASSESSMENT:   37 y.o. G60P0010 female with h/o anxiety, depression, lyme's disease, migraines, infertility, pelvic pain, left hydrosalpinx, dermoid cyst s/p ovarian cystectomy with an incidental cystotomy and repair (2007), s/p laparoscopy with extensive abdominopelvic adhesiolysis  with chromopertubation (01/2018), endometriosis and s/p robotic-assisted laparoscopic adhesiolysis, left salpingectomy, drainage of peritoneal cyst and chromotubation ([id number]) complicated by pelvic abscess requiring IR drain, AKI, sepsis and bowel perforation s/p Hartmanns [id number] complicated by post op ileus requiring extended period of TPN and who is now s/p Hartmann's reversal, open takedown of splenic flexure, appendectomy and parastomal hernia repair 2/20.  Met with pt and pt's mother in room today. Pt is well known to this RD from a recent previous admission where pt required an extended period of TPN. Today, pt looks great, is sitting up in bed and in good spirits. Pt reports good appetite and oral intake pta. Per chart, pt is up ~5lbs since her last admission. Pt is eating her clear liquid diet well. Pt reports that she has not yet passed flatus but she feels as though she is getting close. RD discussed with pt the importance of adequate  nutrition needed to support post op healing. Pt has been drinking protein drinks at home and is willing to continue drinking them after discharge. RD will add supplements and MVI to help pt meet her estimated needs. Pt is likely at mild to moderate refeed risk; will check electrolytes tomorrow morning. Pt with numerous questions regarding post of diet recommendations. RD provided pt with low fiber/high protein diet education today.   Medications reviewed and include: lovenox, protonix, Kcl  Labs reviewed: K 3.3(L)  NUTRITION - FOCUSED PHYSICAL EXAM:  Flowsheet Row Most Recent Value  Orbital Region No depletion  Upper Arm Region No depletion  Thoracic and Lumbar Region No depletion  Buccal Region No depletion  Temple Region No depletion  Clavicle Bone Region No depletion  Clavicle and Acromion Bone Region No depletion  Scapular Bone Region No depletion  Dorsal Hand No depletion  Patellar Region No depletion  Anterior Thigh Region No depletion  Posterior Calf Region No depletion  Edema (RD Assessment) None  Hair Reviewed  Eyes Reviewed  Mouth Reviewed  Skin Reviewed  Nails Reviewed   Diet Order:   Diet Order             Diet clear liquid Room service appropriate? Yes  Diet effective now                  EDUCATION NEEDS:   Education needs have been addressed  Skin:  Skin Assessment: Reviewed RN Assessment (incision abdomen)  Last BM:  2/19  Height:   Ht Readings from Last 1 Encounters:  10/15/22 5' 3"$  (1.6 m)    Weight:   Wt Readings from Last  1 Encounters:  10/15/22 88.5 kg    Ideal Body Weight:  52.3 kg  BMI:  Body mass index is 34.54 kg/m.  Estimated Nutritional Needs:   Kcal:  1900-2200kcal/day  Protein:  95-110g/day  Fluid:  1.6-1.8L/day  Koleen Distance MS, RD, LDN Please refer to [facility] for RD and/or RD on-call/weekend/after hours pager

## 2022-10-16 NOTE — Progress Notes (Addendum)
[place] [facility] Day(s): 1.   Post op day(s): 1 Day Post-Op.   Interval History:  Patient seen and examined No acute events or new complaints overnight.  Patient reports she is doing well Expected abdominal soreness No fever, chills, nausea, emesis CBC pending Renal function normal; sCr - 0.86; UO - 1900 ccs Mild hypokalemia to 3.3 She is on CLD; tolerating No flatus She has ambulated this morning   Vital signs in last 24 hours: [min-max] current  Temp:  [97.6 F (36.4 C)-99.2 F (37.3 C)] 98 F (36.7 C) (02/21 0414) Pulse Rate:  [78-101] 97 (02/21 0414) Resp:  [16-20] 20 (02/21 0414) BP: (97-107)/(53-76) 107/72 (02/21 0414) SpO2:  [94 %-99 %] 98 % (02/21 0414)     Height: 5' 3"$  (160 cm) Weight: 88.5 kg BMI (Calculated): 34.55   Intake/Output last 2 shifts:  02/20 0701 - 02/21 0700 In: 3249.2 [I.V.:2799.2; IV Piggyback:450] Out: 2050 [Urine:1900; Blood:150]   Physical Exam:  Constitutional: alert, cooperative and no distress  Respiratory: breathing non-labored at rest  Cardiovascular: regular rate and sinus rhythm  Gastrointestinal: Soft, incisional soreness as expected, non-distended, no rebound/guarding Genitourinary: Foley cathter in place Integumentary: Laparotomy and laparoscopic incisions are CDI with staples and honeycomb dressing, no drainage, no erythema  Labs:     Latest Ref Rng & Units 10/15/2022    2:39 PM 10/08/2022   10:47 AM 07/12/2022    2:16 PM  CBC  WBC 4.0 - 10.5 K/uL 19.4  8.7  7.6   Hemoglobin 12.0 - 15.0 g/dL 14.3  14.8  15.3   Hematocrit 36.0 - 46.0 % 42.4  43.2  44.9   Platelets 150 - 400 K/uL 265  238  328       Latest Ref Rng & Units 10/16/2022    4:45 AM 10/15/2022    2:39 PM 10/08/2022   10:47 AM  CMP  Glucose 70 - 99 mg/dL 115   118   BUN 6 - 20 mg/dL 8   13   Creatinine 0.44 - 1.00 mg/dL 0.86  0.89  0.69   Sodium 135 - 145 mmol/L 138   136   Potassium 3.5 - 5.1 mmol/L 3.3   3.7    Chloride 98 - 111 mmol/L 105   102   CO2 22 - 32 mmol/L 24   24   Calcium 8.9 - 10.3 mg/dL 7.9   9.1    Imaging studies: No new pertinent imaging studies   Assessment/Plan:  37 y.o. female 1 Day Post-Op s/p colostomy takedown, appendectomy, and repair of parastomal hernia.   - Continue CLD for now; okay to advance once passing flatus    - Complete perioperative ABx   - Continue Entereg until ROBF  - Continue IVF support for now; wean once diet advancing   - Discontinue foley catheter this morning  - Monitor abdominal examination; on-going bowel function - Pain control prn; antiemetics prn - Replete K+; monitor; morning labs - Mobilize; did ambulate this AM; no need for therapies currently   All of the above findings and recommendations were discussed with the patient, patient's family at bedside, and the medical team, and all of patient's and family's questions were answered to their expressed satisfaction.  -- [name], PA-C [facility] 10/16/2022, 7:21 AM M-F: 7am - 4pm

## 2022-10-16 NOTE — Progress Notes (Signed)
Mobility Specialist - Progress Note   10/16/22 1603  Mobility  Activity Ambulated independently in hallway  Level of Assistance Modified independent, requires aide device or extra time  Assistive Device None  Distance Ambulated (ft) 320 ft  Activity Response Tolerated well  $Mobility charge 1 Mobility   Pt amb two laps around the NS on RA, tolerated well. Pt returned to room, left sitting in the bed with needs within reach.   [place]ie [place] Mobility Specialist 10/16/22 4:06 PM

## 2022-10-17 LAB — BASIC METABOLIC PANEL
Anion gap: 7 (ref 5–15)
BUN: <5 mg/dL — ABNORMAL LOW (ref 6–20)
CO2: 22 mmol/L (ref 22–32)
Calcium: 7.8 mg/dL — ABNORMAL LOW (ref 8.9–10.3)
Chloride: 109 mmol/L (ref 98–111)
Creatinine, Ser: 0.6 mg/dL (ref 0.44–1.00)
GFR, Estimated: >60 mL/min (ref >60)
Glucose, Bld: 95 mg/dL (ref 70–99)
Potassium: 3.4 mmol/L — ABNORMAL LOW (ref 3.5–5.1)
Sodium: 138 mmol/L (ref 135–145)

## 2022-10-17 LAB — CBC
HCT: 33.8 % — ABNORMAL LOW (ref 36.0–46.0)
Hemoglobin: 11.2 g/dL — ABNORMAL LOW (ref 12.0–15.0)
MCH: 28.1 pg (ref 26.0–34.0)
MCHC: 33.1 g/dL (ref 30.0–36.0)
MCV: 84.7 fL (ref 80.0–100.0)
Platelets: 208 10*3/uL (ref 150–400)
RBC: 3.99 MIL/uL (ref 3.87–5.11)
RDW: 13.2 % (ref 11.5–15.5)
WBC: 8.4 10*3/uL (ref 4.0–10.5)
nRBC: 0 % (ref 0.0–0.2)

## 2022-10-17 LAB — MAGNESIUM: Magnesium: 1.9 mg/dL (ref 1.7–2.4)

## 2022-10-17 LAB — PHOSPHORUS: Phosphorus: 2.7 mg/dL (ref 2.5–4.6)

## 2022-10-17 MED ORDER — ENSURE ENLIVE PO LIQD
237.0000 mL | Freq: Two times a day (BID) | ORAL | Status: DC
  Administered 2022-10-17 – 2022-10-18 (×2): 237 mL via ORAL

## 2022-10-17 NOTE — Progress Notes (Signed)
[facility] Day(s): 2.   Post op day(s): 2 Days Post-Op.   Interval History:  Patient seen and examined No acute events or new complaints overnight.  Patient doing well overall Slight increase in abdominal soreness but up moving around room this AM No fever, chills, nausea, emesis Labs remain very reassuring aside from mild hypokalemia to 3.4 She is on CLD; tolerating She is passing flatus  She has ambulated this morning   Vital signs in last 24 hours: [min-max] current  Temp:  [97.7 F (36.5 C)-98.7 F (37.1 C)] 97.9 F (36.6 C) (02/22 0755) Pulse Rate:  [76-89] 89 (02/22 0755) Resp:  [16-20] 20 (02/22 0359) BP: (90-115)/(62-75) 115/75 (02/22 0755) SpO2:  [94 %-99 %] 98 % (02/22 0755) FiO2 (%):  [21 %] 21 % (02/22 0755) Weight:  [88.7 kg] 88.7 kg (02/22 0500)     Height: 5' 3"$  (160 cm) Weight: 88.7 kg BMI (Calculated): 34.65   Intake/Output last 2 shifts:  02/21 0701 - 02/22 0700 In: 900 [I.V.:900] Out: 525 [Urine:525]   Physical Exam:  Constitutional: alert, cooperative and no distress  Respiratory: breathing non-labored at rest  Cardiovascular: regular rate and sinus rhythm  Gastrointestinal: Soft, incisional soreness as expected, non-distended, no rebound/guarding Integumentary: Laparotomy and laparoscopic incisions are CDI with staples and honeycomb dressing, no drainage, no erythema  Labs:     Latest Ref Rng & Units 10/17/2022    4:47 AM 10/16/2022    4:45 AM 10/15/2022    2:39 PM  CBC  WBC 4.0 - 10.5 K/uL 8.4  10.0  19.4   Hemoglobin 12.0 - 15.0 g/dL 11.2  12.4  14.3   Hematocrit 36.0 - 46.0 % 33.8  37.5  42.4   Platelets 150 - 400 K/uL 208  239  265       Latest Ref Rng & Units 10/17/2022    4:47 AM 10/16/2022    4:45 AM 10/15/2022    2:39 PM  CMP  Glucose 70 - 99 mg/dL 95  115    BUN 6 - 20 mg/dL <5  8    Creatinine 0.44 - 1.00 mg/dL 0.60  0.86  0.89   Sodium 135 - 145 mmol/L 138  138    Potassium 3.5  - 5.1 mmol/L 3.4  3.3    Chloride 98 - 111 mmol/L 109  105    CO2 22 - 32 mmol/L 22  24    Calcium 8.9 - 10.3 mg/dL 7.8  7.9     Imaging studies: No new pertinent imaging studies   Assessment/Plan:  37 y.o. female 2 Days Post-Op s/p colostomy takedown, appendectomy, and repair of parastomal hernia.   - Advance to full liquids + Ensure   - Discontinue IVF support - Monitor abdominal examination; on-going bowel function - Pain control prn; antiemetics prn - Replete K+; monitor; morning labs - Mobilize; did ambulate this AM; no need for therapies currently    - Discharge Planning; Doing well, with bowel function, diet advancing. Potentially home in next 24-48 hours   All of the above findings and recommendations were discussed with the patient, patient's family at bedside, and the medical team, and all of patient's and family's questions were answered to their expressed satisfaction.  -- [name], PA-C [facility] 10/17/2022, 8:33 AM M-F: 7am - 4pm

## 2022-10-18 LAB — BASIC METABOLIC PANEL
Anion gap: 7 (ref 5–15)
BUN: <5 mg/dL — ABNORMAL LOW (ref 6–20)
CO2: 26 mmol/L (ref 22–32)
Calcium: 8.2 mg/dL — ABNORMAL LOW (ref 8.9–10.3)
Chloride: 107 mmol/L (ref 98–111)
Creatinine, Ser: 0.62 mg/dL (ref 0.44–1.00)
GFR, Estimated: >60 mL/min (ref >60)
Glucose, Bld: 94 mg/dL (ref 70–99)
Potassium: 3.6 mmol/L (ref 3.5–5.1)
Sodium: 140 mmol/L (ref 135–145)

## 2022-10-18 MED ORDER — OXYCODONE HCL 5 MG PO TABS: 5.0000 mg | ORAL_TABLET | Freq: Four times a day (QID) | ORAL | 0 refills | Status: DC | PRN

## 2022-10-18 MED ORDER — METHOCARBAMOL 500 MG PO TABS: 500.0000 mg | ORAL_TABLET | Freq: Three times a day (TID) | ORAL | 0 refills | Status: DC | PRN

## 2022-10-18 MED ORDER — PREGABALIN 100 MG PO CAPS: 100.0000 mg | ORAL_CAPSULE | Freq: Three times a day (TID) | ORAL | 0 refills | Status: DC

## 2022-10-18 MED ORDER — PREGABALIN 75 MG PO CAPS: 200.0000 mg | ORAL_CAPSULE | Freq: Three times a day (TID) | ORAL | Status: DC

## 2022-10-18 MED ORDER — ONDANSETRON 4 MG PO TBDP: 4.0000 mg | ORAL_TABLET | Freq: Four times a day (QID) | ORAL | 0 refills | Status: DC | PRN

## 2022-10-18 MED ORDER — IBUPROFEN 800 MG PO TABS: 800.0000 mg | ORAL_TABLET | Freq: Three times a day (TID) | ORAL | 0 refills | Status: DC | PRN

## 2022-10-18 NOTE — Discharge Instructions (Signed)
In addition to included general post-operative instructions,  Diet: Resume home diet.   Activity: No heavy lifting >20 pounds (children, pets, laundry, garbage) or strenuous activity for 4 weeks, but light activity and walking are encouraged. Do not drive or drink alcohol if taking narcotic pain medications or having pain that might distract from driving. Utilize abdominal binder as need or while ambulating for comfort and support.   Wound care: You may shower/get incision wet with soapy water and pat dry (do not rub incisions), but no baths or submerging incision underwater until follow-up. We will remove staples in follow up.   Medications: Resume all home medications. For mild to moderate pain: acetaminophen (Tylenol) or ibuprofen/naproxen (if no kidney disease). Combining Tylenol with alcohol can substantially increase your risk of causing liver disease. Narcotic pain medications, if prescribed, can be used for severe pain, though may cause nausea, constipation, and drowsiness. Do not combine Tylenol and Percocet (or similar) within a 6 hour period as Percocet (and similar) contain(s) Tylenol. If you do not need the narcotic pain medication, you do not need to fill the prescription.  Call office [phone] / [phone]) at any time if any questions, worsening pain, fevers/chills, bleeding, drainage from incision site, or other concerns.

## 2022-10-18 NOTE — Discharge Summary (Signed)
[facility] SURGICAL DISCHARGE SUMMARY  Patient ID: Lacey Harrison MRN: [id number] DOB/AGE: 1986/05/14 37 y.o.  Admit date: 10/15/2022 Discharge date: 10/18/2022  Discharge Diagnoses Patient Active Problem List   Diagnosis Date Noted   Colostomy in place [facility]) 10/15/2022   S/P colostomy takedown 10/15/2022    Consultants None  Procedures 10/15/2022:  Hartmann's reversal using 25 mm EEA stapler 2.   Open takedown of splenic flexure 3.   Appendectomy 4.   Parastomal hernia repair   HPI: Lacey Harrison is a 37 y.o. female with history of Hartman's Procedure for iatrogenic injury who presents to [facility] on 02/20 for scheduled reversal.   Hospital Course: Informed consent was obtained and documented, and patient underwent uneventful colostomy takedown (Dr Dahlia Byes, 10/15/2022).  Post-operatively, patient did very well. She had ROBF on POD1. Advancement of patient's diet and ambulation were well-tolerated. The remainder of patient's hospital course was essentially unremarkable, and discharge planning was initiated accordingly with patient safely able to be discharged home with appropriate discharge instructions, pain control, and outpatient follow-up after all of her questions were answered to her expressed satisfaction.   Discharge Condition: Good   Physical Examination:  Constitutional: alert, cooperative and no distress  Respiratory: breathing non-labored at rest  Cardiovascular: regular rate and sinus rhythm  Gastrointestinal: Soft, incisional soreness as expected, non-distended, no rebound/guarding Integumentary: Laparotomy and laparoscopic incisions are CDI with staples, honeycomb removed, no drainage, no erythema   Allergies as of 10/18/2022       Reactions   Tylenol [acetaminophen] Other (See Comments)   Lingering Taste        Medication List     STOP taking these medications    bisacodyl 5 MG EC tablet Commonly known as: DULCOLAX    metroNIDAZOLE 500 MG tablet Commonly known as: Flagyl   neomycin 500 MG tablet Commonly known as: MYCIFRADIN   polyethylene glycol powder 17 GM/SCOOP powder Commonly known as: MiraLax       TAKE these medications    ibuprofen 800 MG tablet Commonly known as: ADVIL Take 1 tablet (800 mg total) by mouth every 8 (eight) hours as needed.   methocarbamol 500 MG tablet Commonly known as: ROBAXIN Take 1 tablet (500 mg total) by mouth every 8 (eight) hours as needed for muscle spasms.   ondansetron 4 MG disintegrating tablet Commonly known as: ZOFRAN-ODT Take 1 tablet (4 mg total) by mouth every 6 (six) hours as needed for nausea.   oxyCODONE 5 MG immediate release tablet Commonly known as: Oxy IR/ROXICODONE Take 1 tablet (5 mg total) by mouth every 6 (six) hours as needed for severe pain or breakthrough pain.   pregabalin 100 MG capsule Commonly known as: LYRICA Take 1 capsule (100 mg total) by mouth 3 (three) times daily for 14 days.          Follow-up Information     Tylene Fantasia, PA-C. Schedule an appointment as soon as possible for a visit.   Specialty: Physician Assistant Why: s/p colostomy takedown, has staples Contact information: [address] [place] [address] [phone]                  Time spent on discharge management including discussion of hospital course, clinical condition, outpatient instructions, prescriptions, and follow up with the patient and members of the medical team: >30 minutes  -- [name] , PA-C [facility]  10/18/2022, 9:10 AM [phone] M-F: 7am - 4pm

## 2022-10-18 NOTE — Progress Notes (Signed)
Mobility Specialist - Progress Note   10/18/22 0919  Mobility  Activity Ambulated independently in hallway  Level of Assistance Independent  Assistive Device None  Distance Ambulated (ft) 340 ft  Activity Response Tolerated well  $Mobility charge 1 Mobility   Pt amb two laps around NS on RA, tolerated well. Pt returned to room, left amb with needs within reach.   Candie Mile Mobility Specialist 10/18/22 9:20 AM

## 2022-10-21 ENCOUNTER — Telehealth: Payer: Self-pay

## 2022-10-21 NOTE — Transitions of Care (Post Inpatient/ED Visit) (Signed)
   10/21/2022  Name: Lacey Harrison MRN: [id number] DOB: October 23, 1985  Today's TOC FU Call Status: Today's TOC FU Call Status:: Successful TOC FU Call Competed TOC FU Call Complete Date: 10/21/22  Transition Care Management Follow-up Telephone Call Date of Discharge: 10/18/22 Discharge Facility: [facility]) Type of Discharge: Inpatient Admission How have you been since you were released from the hospital?: Better Any questions or concerns?: No  Items Reviewed: Did you receive and understand the discharge instructions provided?: Yes Medications obtained and verified?: Yes (Medications Reviewed) Any new allergies since your discharge?: No Dietary orders reviewed?: Yes Do you have support at home?: Yes People in Home: spouse  Home Care and Equipment/Supplies: [place] Ordered?: NA Any new equipment or medical supplies ordered?: NA  Functional Questionnaire: Do you need assistance with bathing/showering or dressing?: No Do you need assistance with meal preparation?: No Do you need assistance with eating?: No Do you have difficulty maintaining continence: No Do you need assistance with getting out of bed/getting out of a chair/moving?: No Do you have difficulty managing or taking your medications?: No  Folllow up appointments reviewed: PCP Follow-up appointment confirmed?: NA Specialist Hospital Follow-up appointment confirmed?: Yes Date of Specialist follow-up appointment?: 10/29/22 Follow-Up Specialty Provider:: Loel Dubonnet Do you need transportation to your follow-up appointment?: No Do you understand care options if your condition(s) worsen?: Yes-patient verbalized understanding    SIGNATURE Juanda Crumble, [place] Nurse Health Advisor Direct Dial [phone]

## 2022-10-29 ENCOUNTER — Ambulatory Visit (INDEPENDENT_AMBULATORY_CARE_PROVIDER_SITE_OTHER): Payer: No Typology Code available for payment source | Admitting: Physician Assistant

## 2022-10-29 ENCOUNTER — Encounter: Payer: Self-pay | Admitting: Physician Assistant

## 2022-10-29 VITALS — BP 118/78 | HR 89 | Temp 98.0°F | Ht 63.0 in | Wt 191.0 lb

## 2022-10-29 DIAGNOSIS — Z933 Colostomy status: Secondary | ICD-10-CM

## 2022-10-29 DIAGNOSIS — Z09 Encounter for follow-up examination after completed treatment for conditions other than malignant neoplasm: Secondary | ICD-10-CM

## 2022-10-29 DIAGNOSIS — K660 Peritoneal adhesions (postprocedural) (postinfection): Secondary | ICD-10-CM

## 2022-10-29 NOTE — Patient Instructions (Addendum)
We will have you follow up here in 1 month. You may keep a light gauze at the base, skin fold to help keep the area dry.   GENERAL POST-OPERATIVE PATIENT INSTRUCTIONS   WOUND CARE INSTRUCTIONS:  Try to keep the wound dry and avoid ointments on the wound unless directed to do so.  If the wound becomes bright red and painful or starts to drain infected material that is not clear, please contact your physician immediately.  If the wound is mildly pink and has a thick firm ridge underneath it, this is normal, and is referred to as a healing ridge.  This will resolve over the next 4-6 weeks.  BATHING: You may shower if you have been informed of this by your surgeon. However, Please do not submerge in a tub, hot tub, or pool until incisions are completely sealed or have been told by your surgeon that you may do so.  DIET:  You may eat any foods that you can tolerate.  It is a good idea to eat a high fiber diet and take in plenty of fluids to prevent constipation.  If you do become constipated you may want to take a mild laxative or take ducolax tablets on a daily basis until your bowel habits are regular.  Constipation can be very uncomfortable, along with straining, after recent surgery.  ACTIVITY: You may want to hug a pillow when coughing and sneezing to add additional support to the surgical area, if you had abdominal or chest surgery, which will decrease pain during these times.  You are encouraged to walk and engage in light activity for the next two weeks.  You should not lift more than 20 pounds for 6 weeks total after surgery as it could put you at increased risk for complications.  Twenty pounds is roughly equivalent to a plastic bag of groceries. At that time- Listen to your body when lifting, if you have pain when lifting, stop and then try again in a few days. Soreness after doing exercises or activities of daily living is normal as you get back in to your normal routine.  MEDICATIONS:  Try to  take narcotic medications and anti-inflammatory medications, such as tylenol, ibuprofen, naprosyn, etc., with food.  This will minimize stomach upset from the medication.  Should you develop nausea and vomiting from the pain medication, or develop a rash, please discontinue the medication and contact your physician.  You should not drive, make important decisions, or operate machinery when taking narcotic pain medication.  SUNBLOCK Use sun block to incision area over the next year if this area will be exposed to sun. This helps decrease scarring and will allow you avoid a permanent darkened area over your incision.  QUESTIONS:  Please feel free to call our office if you have any questions, and we will be glad to assist you. [phone]

## 2022-10-29 NOTE — Progress Notes (Signed)
[facility] POST-OP OFFICE VISIT  10/29/2022  HPI: Lacey Harrison is a 37 y.o. female 14 days s/p open colostomy takedown and appendectomy with Dr Lacey Harrison  She continues to do very well Still with incisional soreness expectedly but only using OTC medications sparingly No fever, chills, nausea, emesis She is tolerating PO without issue; normal bowel function Incision is healing well; there is a small area of redness inferiorly on the laparotomy incision and she reports scant serosanguinous drainage Otherwise no complaints   Vital signs: BP 118/78   Pulse 89   Temp 98 F (36.7 C)   Ht '5\' 3"'$  (1.6 m)   Wt 191 lb (86.6 kg)   LMP 10/14/2022 (Exact Date)   SpO2 98%   BMI 33.83 kg/m    Physical Exam: Constitutional: Well appearing female, NAD Abdomen: Soft, incisional soreness expectedly, non-distended, no rebound/guarding Skin: Laparotomy and previous colostomy sites are CDI with staples (removed). There is a small area of erythema to the inferior aspect of her laparotomy incision near skin fold, there is scant serous drainage here, I did attempt to probe this with q-tip but wound is well sealed, no fluid collections  Assessment/Plan: This is a 37 y.o. female 14 days s/p open colostomy takedown and appendectomy with Dr Lacey Harrison   Lacey Harrison removed   - Pain control prn  - Reviewed wound care recommendation  - Reviewed lifting restrictions; 6 weeks total  - She will follow up in ~1 month; She understands to call with questions/concerns in the interim   -- [name], PA-C [facility] 10/29/2022, 2:23 PM M-F: 7am - 4pm

## 2022-11-05 ENCOUNTER — Ambulatory Visit (INDEPENDENT_AMBULATORY_CARE_PROVIDER_SITE_OTHER): Payer: No Typology Code available for payment source | Admitting: Physician Assistant

## 2022-11-05 ENCOUNTER — Encounter: Payer: Self-pay | Admitting: Physician Assistant

## 2022-11-05 ENCOUNTER — Other Ambulatory Visit: Payer: Self-pay

## 2022-11-05 VITALS — BP 111/77 | HR 83 | Temp 98.2°F | Ht 63.0 in | Wt 192.0 lb

## 2022-11-05 DIAGNOSIS — Z09 Encounter for follow-up examination after completed treatment for conditions other than malignant neoplasm: Secondary | ICD-10-CM

## 2022-11-05 DIAGNOSIS — K660 Peritoneal adhesions (postprocedural) (postinfection): Secondary | ICD-10-CM

## 2022-11-05 NOTE — Patient Instructions (Addendum)
    Please keep your scheduled appointment.     GENERAL POST-OPERATIVE PATIENT INSTRUCTIONS   WOUND CARE INSTRUCTIONS:  Keep a dry clean dressing on the wound if there is drainage. The initial bandage may be removed after 24 hours.  Once the wound has quit draining you may leave it open to air.  If clothing rubs against the wound or causes irritation and the wound is not draining you may cover it with a dry dressing during the daytime.  Try to keep the wound dry and avoid ointments on the wound unless directed to do so.  If the wound becomes bright red and painful or starts to drain infected material that is not clear, please contact your physician immediately.  If the wound is mildly pink and has a thick firm ridge underneath it, this is normal, and is referred to as a healing ridge.  This will resolve over the next 4-6 weeks.  BATHING: You may shower if you have been informed of this by your surgeon. However, Please do not submerge in a tub, hot tub, or pool until incisions are completely sealed or have been told by your surgeon that you may do so.  DIET:  You may eat any foods that you can tolerate.  It is a good idea to eat a high fiber diet and take in plenty of fluids to prevent constipation.  If you do become constipated you may want to take a mild laxative or take ducolax tablets on a daily basis until your bowel habits are regular.  Constipation can be very uncomfortable, along with straining, after recent surgery.  ACTIVITY:  You are encouraged to cough and deep breath or use your incentive spirometer if you were given one, every 15-30 minutes when awake.  This will help prevent respiratory complications and low grade fevers post-operatively if you had a general anesthetic.  You may want to hug a pillow when coughing and sneezing to add additional support to the surgical area, if you had abdominal or chest surgery, which will decrease pain during these times.  You are encouraged to walk and  engage in light activity for the next two weeks.  You should not lift more than 20 pounds for 6 weeks total after surgery as it could put you at increased risk for complications.  Twenty pounds is roughly equivalent to a plastic bag of groceries. At that time- Listen to your body when lifting, if you have pain when lifting, stop and then try again in a few days. Soreness after doing exercises or activities of daily living is normal as you get back in to your normal routine.  MEDICATIONS:  Try to take narcotic medications and anti-inflammatory medications, such as tylenol, ibuprofen, naprosyn, etc., with food.  This will minimize stomach upset from the medication.  Should you develop nausea and vomiting from the pain medication, or develop a rash, please discontinue the medication and contact your physician.  You should not drive, make important decisions, or operate machinery when taking narcotic pain medication.  SUNBLOCK Use sun block to incision area over the next year if this area will be exposed to sun. This helps decrease scarring and will allow you avoid a permanent darkened area over your incision.  QUESTIONS:  Please feel free to call our office if you have any questions, and we will be glad to assist you. [phone]

## 2022-11-05 NOTE — Progress Notes (Signed)
[facility] POST-OP OFFICE VISIT  11/05/2022  HPI: Lacey Harrison is a 37 y.o. female 21 days s/p open colostomy takedown and appendectomy with Dr Dahlia Byes   Has been doing well Did note that over the weekend she noticed a small spot towards the inferior portion of her laparotomy incision which seemed to open up a bit at the skin level; some serous drainage but no erythema. She wants to be certain there is no issues with this Also with increased pain at previous colostomy site but does report being more active Otherwise, no fever, chills, nausea, emesis She is tolerating PO and having normal bowel function   Vital signs: BP 111/77   Pulse 83   Temp 98.2 F (36.8 C) (Oral)   Ht '5\' 3"'$  (1.6 m)   Wt 192 lb (87.1 kg)   LMP 10/14/2022 (Exact Date)   SpO2 97%   BMI 34.01 kg/m    Physical Exam: Constitutional: Well appearing female, NAD Abdomen: Soft, she is tender over the lateral aspect of the colostomy site with expected degree of induration consistent with healing, no hernia, non-distended, no rebound/guarding Skin: There is a small 1 cm area of superficial skin dehiscence to the inferior aspect of the laparotomy, there is no depth, no drainage, no erythema. The rest of the laparotomy incision is well healed. Previous colostomy incision is well healed.   Assessment/Plan: This is a 37 y.o. female 21 days s/p open colostomy takedown and appendectomy with Dr Dahlia Byes    - Pain control prn  - Reviewed wound care recommendation - superficial dressing (ie: Band-Aid) as needed   - Reviewed lifting restrictions; 6 weeks total - reviewed abdominal binder use  - She will follow up as scheduled; She understands to call with questions/concerns in the interim   -- [name], PA-C [facility] 11/05/2022, 2:16 PM M-F: 7am - 4pm

## 2022-11-28 ENCOUNTER — Ambulatory Visit: Payer: No Typology Code available for payment source | Admitting: Physician Assistant

## 2022-11-28 ENCOUNTER — Encounter: Payer: Self-pay | Admitting: Physician Assistant

## 2022-11-28 VITALS — BP 118/82 | HR 90 | Temp 99.7°F | Ht 63.0 in | Wt 191.0 lb

## 2022-11-28 DIAGNOSIS — K660 Peritoneal adhesions (postprocedural) (postinfection): Secondary | ICD-10-CM

## 2022-11-28 DIAGNOSIS — Z933 Colostomy status: Secondary | ICD-10-CM

## 2022-11-28 DIAGNOSIS — Z09 Encounter for follow-up examination after completed treatment for conditions other than malignant neoplasm: Secondary | ICD-10-CM

## 2022-11-28 NOTE — Patient Instructions (Signed)

## 2022-11-28 NOTE — Progress Notes (Signed)
[facility] POST-OP OFFICE VISIT  11/28/2022  HPI: Lacey Harrison is a 37 y.o. female ~6 weeks s/p open colostomy takedown and appendectomy with Dr Dahlia Byes   She has continued to do well given circumstances She did note a recent illness and frequent coughing which resulted in upper incisional soreness; worried about a hernia but denied any obvious bulging or changes to her abdomen No fever, chills, nausea, emesis She does note intolerance to random foods occasionally; recently noting onions Has been more constipated but attributes this to diet and lack of hydration Incisions are well healed    Vital signs: BP 118/82   Pulse 90   Temp 99.7 F (37.6 C) (Oral)   Ht 5\' 3"  (1.6 m)   Wt 191 lb (86.6 kg)   SpO2 98%   BMI 33.83 kg/m    Physical Exam: Constitutional: Well appearing female, NAD Abdomen: Soft, non-tender, non-distended, no rebound/guarding. There is no evidence of hernia on examination. She may have small degree of diastasis recti in the upper abdomen Skin:Incisions are all well healed.   Assessment/Plan: This is a 37 y.o. female ~6 weeks s/p open colostomy takedown and appendectomy with Dr Dahlia Byes    - Nothing further to do from surgical standpoint at this time  - There was question of possible diastasis recti to the upper abdomen but this is subtle; not causing any issues  - She has completed lifting restrictions  - I will see her again in 3 months for hopefully final recheck; She understands to call with questions/concerns in the interim  -- [name], PA-C [facility] 11/28/2022, 1:56 PM M-F: 7am - 4pm

## 2023-03-04 ENCOUNTER — Ambulatory Visit (INDEPENDENT_AMBULATORY_CARE_PROVIDER_SITE_OTHER): Payer: 59 | Admitting: Physician Assistant

## 2023-03-04 ENCOUNTER — Other Ambulatory Visit
Admission: RE | Admit: 2023-03-04 | Discharge: 2023-03-04 | Disposition: A | Discharge status: Home / Self Care | Payer: 59 | Attending: Physician Assistant | Admitting: Physician Assistant

## 2023-03-04 ENCOUNTER — Encounter: Payer: Self-pay | Admitting: Physician Assistant

## 2023-03-04 VITALS — BP 108/67 | HR 82 | Temp 97.9°F | Ht 63.0 in | Wt 200.2 lb

## 2023-03-04 DIAGNOSIS — K5900 Constipation, unspecified: Secondary | ICD-10-CM | POA: Diagnosis not present

## 2023-03-04 DIAGNOSIS — K9400 Colostomy complication, unspecified: Secondary | ICD-10-CM | POA: Insufficient documentation

## 2023-03-04 DIAGNOSIS — K625 Hemorrhage of anus and rectum: Secondary | ICD-10-CM

## 2023-03-04 DIAGNOSIS — Z09 Encounter for follow-up examination after completed treatment for conditions other than malignant neoplasm: Secondary | ICD-10-CM

## 2023-03-04 LAB — CBC
HCT: 42.3 % (ref 36.0–46.0)
Hemoglobin: 14.6 g/dL (ref 12.0–15.0)
MCH: 28.2 pg (ref 26.0–34.0)
MCHC: 34.5 g/dL (ref 30.0–36.0)
MCV: 81.8 fL (ref 80.0–100.0)
Platelets: 290 10*3/uL (ref 150–400)
RBC: 5.17 MIL/uL — ABNORMAL HIGH (ref 3.87–5.11)
RDW: 12.8 % (ref 11.5–15.5)
WBC: 11.1 10*3/uL — ABNORMAL HIGH (ref 4.0–10.5)
nRBC: 0 % (ref 0.0–0.2)

## 2023-03-04 LAB — BASIC METABOLIC PANEL
Anion gap: 10 (ref 5–15)
BUN: 15 mg/dL (ref 6–20)
CO2: 26 mmol/L (ref 22–32)
Calcium: 9.4 mg/dL (ref 8.9–10.3)
Chloride: 99 mmol/L (ref 98–111)
Creatinine, Ser: 0.64 mg/dL (ref 0.44–1.00)
GFR, Estimated: >60 mL/min (ref >60)
Glucose, Bld: 96 mg/dL (ref 70–99)
Potassium: 4.3 mmol/L (ref 3.5–5.1)
Sodium: 135 mmol/L (ref 135–145)

## 2023-03-04 NOTE — Patient Instructions (Addendum)
Your CT is scheduled for 03/11/2023 at 2:30 pm (arrive by 12:30 pm) Outpatient Imaging on [address].   Please go to [facility] to get labs drawn. You do not need an appointment.   If you have any concerns or questions, please feel free to call our office.   Colostomy Reversal Surgery, Care After The following information offers guidance on how to care for yourself after your procedure. Your health care provider may also give you more specific instructions. If you have problems or questions, contact your health care provider. What can I expect after the procedure? After the procedure, it is common to have: Pain and discomfort in your abdomen, especially near your incision. Decreased appetite. You may have temporary bowel changes, such as: Passing gas (flatulence). An urgent need to have a bowel movement. More frequent bowel movements. Some leakage of stool or the inability to control when you have bowel movements (incontinence). This may cause skin soreness around your rectum due to exposure to stool and wiping of skin. This usually gets better within a couple weeks. Loose stools or diarrhea. Constipation. Follow these instructions at home: Activity  You may have to avoid lifting. Ask your health care provider how much you can safely lift. Return to your normal activities as told by your health care provider. Ask your health care provider what activities are safe for you. Avoid sitting for a long time without moving. Get up to take short walks every 1-2 hours. This is important to improve blood flow and breathing. Ask for help if you feel weak or unsteady. Avoid activities that take a lot of effort, contact sports, and abdominal exercises for 4 weeks or as long as told by your health care provider. Incision care  Follow instructions from your health care provider about how to take care of your incision. Make sure you: Wash your hands with soap and water for at least 20 seconds  before and after you change your bandage (dressing). If soap and water are not available, use hand sanitizer. Change your dressing as told by your health care provider. Leave stitches (sutures), skin glue, or adhesive strips in place. These skin closures may need to stay in place for 2 weeks or longer. If adhesive strip edges start to loosen and curl up, you may trim the loose edges. Do not remove adhesive strips completely unless your health care provider tells you to do that. Keep the incision area clean and dry. Check your incision area every day for signs of infection. Check for: More redness, swelling, or pain. More fluid or blood. Warmth. Pus or a bad smell. To protect the incision, hold a folded blanket or small pillow against your abdomen when coughing, sneezing, or bending. Bathing Do not take baths, swim, or use a hot tub until your health care provider approves. Ask your health care provider if you may take showers. You may only be allowed to take sponge baths. If your health care provider approves bathing and showering, cover the dressing with a watertight covering to protect it from water. Do not let the dressing get wet. Keep the dressing dry until your health care provider says it can be removed. Driving If you were given a sedative during the procedure, it can affect you for several hours. Do not drive or operate machinery until your health care provider says that it is safe. Ask your health care provider if the medicine prescribed to you requires you to avoid driving or using machinery. Follow other  driving restrictions as told by your health care provider. Eating and drinking Follow instructions from your health care provider about eating or drinking restrictions. This may include: What to eat and drink. You may be told to start eating a bland diet. Over time, you may slowly return to a more normal, healthy diet. How much to eat and drink. You should eat small meals often and  stop eating when you feel full. Take nutrition supplements as told by your health care provider or dietitian. General instructions Take over-the-counter and prescription medicines only as told by your health care provider. Take steps to treat diarrhea or constipation as told by your health care provider. Your health care provider may recommend that you: Drink enough fluid to keep your urine pale yellow. Avoid fluids that contain a lot of sugar or caffeine, such as energy drinks, sports drinks, and soda. Eat bland, easy-to-digest foods in small amounts as you are able. These foods include bananas, applesauce, rice, lean meats, toast, and crackers. Take over-the-counter or prescription medicines. Limit foods that are high in fat and processed sugars, such as fried or sweet foods. If you have skin soreness around your rectum, apply a skin barrier ointment or paste. This can help prevent irritation from occurring or getting worse. Do not use any products that contain nicotine or tobacco. These products include cigarettes, chewing tobacco, and vaping devices, such as e-cigarettes. These can delay incision healing after surgery. If you need help quitting, ask your health care provider. Keep all follow-up visits. This is important. Contact a health care provider if: You have any of these signs of infection: More redness, swelling, or pain at the site of your incision. More fluid or blood coming from your incision. Warmth coming from your incision. Pus or a bad smell coming from your incision. A fever. Your incision breaks open. You feel nauseous. You are constipated, or not able to have a bowel movement. Your diarrhea gets worse. You have pain that is not controlled with medicine. Get help right away if: You have abdominal pain that does not go away or becomes severe. You have frequent vomiting and you are not able to eat or drink. You have difficulty breathing. Summary After colostomy reversal  surgery, it is common to have abdominal pain, decreased appetite, diarrhea, or constipation. Follow instructions from your health care provider about how to take care of your incision. Do not let the dressing get wet. Take over-the-counter and prescription medicines only as told by your health care provider. Contact your health care provider if you are constipated, or not able to have a bowel movement. Keep all follow-up visits. This is important. This information is not intended to replace advice given to you by your health care provider. Make sure you discuss any questions you have with your health care provider. Document Revised: 04/06/2021 Document Reviewed: 04/06/2021 Elsevier Patient Education  2024 [employer].

## 2023-03-04 NOTE — Progress Notes (Signed)
[facility]  03/04/2023  History of Present Illness: Lacey Harrison is a 37 y.o. female well known to our service following initial exploratory laparotomy and colostomy creation on 03/27/2022 and subsequent open colostomy takedown and appendectomy with Dr Everlene Farrier on 10/15/2022. Last seen in follow up on 11/28/2022 and was doing well.   Today, she reports that around 2 months ago she began having bleeding from her rectum mainly with bowel movements. This can be daily  to ever few days or so. She notes this can be about a "quarter size" on the toilet paper and occasionally at the end of a stool. She has been more constipated and not eating or exercising as well lately. No dizziness, light headedness, CP, or SOB. She has used colace on occasion with improvement in these symptoms. Is anxious about Miralax given a bad experience with bowel preporation. Also with intermittent abdominal pain, worse at previous colostomy site. This is exacerbated mainly with sneezing or coughing. No fever, chills, nausea, emesis. No obvious bulges noticed. She is overall very anxious. Otherwise, wound healed well without issue.    Past Medical History: Past Medical History:  Diagnosis Date   Acute respiratory failure (HCC) 03/2022   AKI (acute kidney injury) (HCC)    in setting of sepsi, IV contrast, NAGMA-RESOLVED   Allergy    Anxiety    Chicken pox    Colostomy in place [facility])    Depression    Heart murmur    Ileus (HCC)    Lyme disease    patient reports a remote history of lyme and alludes that she has "chronic lyme"   Migraines    MIGRAINES   Neck mass    Ovarian cyst    Pneumonia 07/2017   Sepsis (HCC) 03/2022   Tobacco use      Past Surgical History: Past Surgical History:  Procedure Laterality Date   APPENDECTOMY  10/15/2022   Procedure: APPENDECTOMY;  Surgeon: Leafy Ro, MD;  Location: [facility];  Service: General;;   CHROMOPERTUBATION  02/16/2018    Procedure: CHROMOPERTUBATION;  Surgeon: Suzy Bouchard, MD;  Location: [facility];  Service: Gynecology;;   CHROMOPERTUBATION Left 03/18/2022   Procedure: CHROMOPERTUBATION;  Surgeon: Hildred Laser, MD;  Location: [facility];  Service: Gynecology;  Laterality: Left;   COLECTOMY WITH COLOSTOMY CREATION/HARTMANN PROCEDURE N/A 03/27/2022   Procedure: SIGMOID COLECTOMY WITH COLOSTOMY CREATION/HARTMANN PROCEDURE; LYSIS OF ADHESIONS;  Surgeon: Leafy Ro, MD;  Location: [facility];  Service: General;  Laterality: N/A;   COLOSTOMY REVERSAL N/A 10/15/2022   Procedure: COLOSTOMY REVERSAL-RNFA to assist;  Surgeon: Leafy Ro, MD;  Location: [facility];  Service: General;  Laterality: N/A;   CYSTECTOMY Left    OVARY   DILATION AND CURETTAGE OF UTERUS  2008   EXCISION MASS NECK N/A 07/25/2022   Procedure: EXCISION MASS NECK, posterior;  Surgeon: Leafy Ro, MD;  Location: [facility];  Service: General;  Laterality: N/A;   LAPAROSCOPIC LYSIS OF ADHESIONS N/A 02/16/2018   Procedure: EXTENSIVE LAPAROSCOPIC LYSIS OF ADHESIONS;  Surgeon: Suzy Bouchard, MD;  Location: [facility];  Service: Gynecology;  Laterality: N/A;   LAPAROSCOPY N/A 02/16/2018   Procedure: LAPAROSCOPY OPERATIVE;  Surgeon: Schermerhorn, Ihor Austin, MD;  Location: [facility];  Service: Gynecology;  Laterality: N/A;   LAPAROTOMY N/A 03/27/2022   Procedure: EXPLORATORY LAPAROTOMY;  Surgeon: Leafy Ro, MD;  Location: [facility];  Service: General;  Laterality: N/A;   LYSIS OF ADHESION N/A 03/18/2022  Procedure: LYSIS OF ADHESION;  Surgeon: Hildred Laser, MD;  Location: [facility];  Service: Gynecology;  Laterality: N/A;   XI ROBOTIC ASSISTED SALPINGECTOMY Left 03/18/2022   Procedure: XI ROBOTIC ASSISTED SALPINGECTOMY;  Surgeon: Hildred Laser, MD;  Location: [facility];  Service: Gynecology;  Laterality: Left;    Home Medications: Prior to Admission medications   Not on File    Allergies: No Active Allergies  Review of  Systems: Review of Systems  Constitutional:  Negative for chills and fever.  Respiratory:  Negative for cough and shortness of breath.   Cardiovascular:  Negative for chest pain and palpitations.  Gastrointestinal:  Positive for abdominal pain, blood in stool and constipation. Negative for diarrhea, nausea and vomiting.  Neurological:  Negative for dizziness and weakness.  Psychiatric/Behavioral:  Negative for depression. The patient is nervous/anxious.   All other systems reviewed and are negative.   Physical Exam There were no vitals taken for this visit.  Physical Exam Vitals and nursing note reviewed. Exam conducted with a chaperone present.  Constitutional:      General: She is not in acute distress.    Appearance: Normal appearance. She is not ill-appearing.     Comments: NAD; Chaperone present for examination   HENT:     Head: Normocephalic and atraumatic.  Eyes:     General: No scleral icterus.    Conjunctiva/sclera: Conjunctivae normal.  Cardiovascular:     Rate and Rhythm: Normal rate.     Pulses: Normal pulses.     Heart sounds: No murmur heard. Pulmonary:     Effort: Pulmonary effort is normal. No respiratory distress.  Abdominal:     General: Abdomen is flat. A surgical scar is present. There is no distension.     Palpations: Abdomen is soft.     Tenderness: There is abdominal tenderness (Over previous colostomy site). There is no guarding or rebound.     Comments: Abdomen is soft, she is tender with deep palpation over prior ostomy site, non-distended, no rebound/guarding. All previous laparotomy and colostomy incisions are well healed  Genitourinary:    Rectum: Normal. No anal fissure or external hemorrhoid.     Comments: Chaperone present, no external hemorrhoids, no obvious bleeding Skin:    General: Skin is warm and dry.     Coloration: Skin is not pale.  Neurological:     General: No focal deficit present.     Mental Status: She is alert and oriented to  person, place, and time.  Psychiatric:        Mood and Affect: Mood is anxious. Affect is tearful.        Behavior: Behavior normal.     Labs/Imaging: No new pertinent imaging studies   Assessment and Plan: This is a 37 y.o. female exploratory laparotomy and colostomy creation on 03/27/2022 and subsequent open colostomy takedown and appendectomy with Dr Everlene Farrier on 10/15/2022.   - Overall doing well but with about 2 months of intermittent of scant GI bleeding. No evidence of external hemorrhoids nor anal fissure on examination today. May be sequela of constipation and irritation of anastomosis. I do not think there is evidence of obstruction or infection. As a precaution, I will get CT Abdomen/Pelvis, CBC, and BMP to reassess. If work up is reassuring and continues to have bleeding may need repeat evaluation with GI for possible endoscopic evaluation. I do no think she warrants any re-admission at the present time. We will continue to follow up after completion of work up. She  was understanding and in agreement with this plan.   Face-to-face time spent with the patient and care providers was 20 minutes, with more than 50% of the time spent counseling, educating, and coordinating care of the patient.     Lynden Oxford, PA-C [facility] 03/04/2023, 1:29 PM M-F: 7am - 4pm

## 2023-03-11 ENCOUNTER — Ambulatory Visit
Admission: RE | Admit: 2023-03-11 | Discharge: 2023-03-11 | Disposition: A | Discharge status: Home / Self Care | Payer: 59 | Source: Ambulatory Visit | Attending: Physician Assistant | Admitting: Physician Assistant

## 2023-03-11 DIAGNOSIS — K625 Hemorrhage of anus and rectum: Secondary | ICD-10-CM | POA: Diagnosis present

## 2023-03-11 DIAGNOSIS — K9400 Colostomy complication, unspecified: Secondary | ICD-10-CM | POA: Insufficient documentation

## 2023-03-11 MED ORDER — IOHEXOL 300 MG/ML  SOLN
100.0000 mL | Freq: Once | INTRAMUSCULAR | Status: AC | PRN
  Administered 2023-03-11: 100 mL via INTRAVENOUS

## 2023-03-11 MED ORDER — BARIUM SULFATE 2 % PO SUSP
900.0000 mL | Freq: Once | ORAL | Status: AC
  Administered 2023-03-11: 900 mL via ORAL

## 2023-03-13 ENCOUNTER — Telehealth: Payer: Self-pay | Admitting: Physician Assistant

## 2023-03-13 MED ORDER — AMOXICILLIN-POT CLAVULANATE 875-125 MG PO TABS: 1.0000 | ORAL_TABLET | Freq: Two times a day (BID) | ORAL | 0 refills | Status: AC

## 2023-03-13 NOTE — Telephone Encounter (Signed)
Telephone Encounter Reviewed patient's CT Abdomen/Pelvis which shows very small collection between rectum and vaginal wall. Unclear etiology. Did review with Dr Everlene Farrier. Do not feel we need to pursue pelvic MRI at this time. Will manage with Abx (Augmentin x10 days). Otherwise stable adnexal cyst.   Additionally reviewed labs which were reassuring. Mild leukocytosis to 11.1K. Hgb normal at 14.6. BMP grossly normal.   Called patient to update her on labs results, imaging findings, and plan of care as noted above.   She reports feeling well. She did have resulting in scant blood from rectum for about a week but then an episode of hard stool and straining noted return of some blood. Suspect she may have small internal fissure. Encouraged bowel regimen to avoid constipation. If this persists we can try nifedipine   I will see her again on 08/06 at 1:45 PM  -- Lynden Oxford, PA-C  [facility] 03/13/2023, 2:44 PM M-F: 7am - 4pm

## 2023-03-27 ENCOUNTER — Encounter: Payer: No Typology Code available for payment source | Admitting: Internal Medicine

## 2023-04-01 ENCOUNTER — Encounter: Payer: Self-pay | Admitting: Physician Assistant

## 2023-04-01 ENCOUNTER — Ambulatory Visit (INDEPENDENT_AMBULATORY_CARE_PROVIDER_SITE_OTHER): Payer: 59 | Admitting: Physician Assistant

## 2023-04-01 VITALS — BP 117/82 | HR 101 | Temp 98.0°F | Ht 63.0 in | Wt 202.0 lb

## 2023-04-01 DIAGNOSIS — K5900 Constipation, unspecified: Secondary | ICD-10-CM

## 2023-04-01 DIAGNOSIS — K9400 Colostomy complication, unspecified: Secondary | ICD-10-CM

## 2023-04-01 DIAGNOSIS — Z09 Encounter for follow-up examination after completed treatment for conditions other than malignant neoplasm: Secondary | ICD-10-CM

## 2023-04-01 NOTE — Progress Notes (Signed)
[facility] SUR[facility] NOTE  04/01/2023  History of Present Illness: Lacey Harrison is a 37 y.o. female well known to our service following initial exploratory laparotomy and colostomy creation on 03/27/2022 and subsequent open colostomy takedown and appendectomy with Dr Everlene Farrier on 10/15/2022. Last seen 07/09 secondary to continued abdominal pain and blood with bowel movements. Work up with labs and CT was reassuring. Since last visit, she reports she is doing much better. Her previously noted abdominal pain is significantly improved. Only with sporadic "muscle spasm" at previous colostomy site from time to time. The blood in her stools has also ceased. She is utilizing Miralax daily but is trying to wean from this. No fever, chills, nausea, emesis. Incisions remain well healed and she has not noticed any bulging at these. Still very sensitive at previous colostomy sites. No other complaints.   Past Medical History: Past Medical History:  Diagnosis Date   Acute respiratory failure (HCC) 03/2022   AKI (acute kidney injury) (HCC)    in setting of sepsi, IV contrast, NAGMA-RESOLVED   Allergy    Anxiety    Chicken pox    Colostomy in place [facility])    Depression    Heart murmur    Ileus (HCC)    Lyme disease    patient reports a remote history of lyme and alludes that she has "chronic lyme"   Migraines    MIGRAINES   Neck mass    Ovarian cyst    Pneumonia 07/2017   Sepsis (HCC) 03/2022   Tobacco use      Past Surgical History: Past Surgical History:  Procedure Laterality Date   APPENDECTOMY  10/15/2022   Procedure: APPENDECTOMY;  Surgeon: Leafy Ro, MD;  Location: [facility];  Service: General;;   CHROMOPERTUBATION  02/16/2018   Procedure: CHROMOPERTUBATION;  Surgeon: Suzy Bouchard, MD;  Location: [facility];  Service: Gynecology;;   CHROMOPERTUBATION Left 03/18/2022   Procedure: CHROMOPERTUBATION;  Surgeon: Hildred Laser, MD;  Location: [facility];   Service: Gynecology;  Laterality: Left;   COLECTOMY WITH COLOSTOMY CREATION/HARTMANN PROCEDURE N/A 03/27/2022   Procedure: SIGMOID COLECTOMY WITH COLOSTOMY CREATION/HARTMANN PROCEDURE; LYSIS OF ADHESIONS;  Surgeon: Leafy Ro, MD;  Location: [facility];  Service: General;  Laterality: N/A;   COLOSTOMY REVERSAL N/A 10/15/2022   Procedure: COLOSTOMY REVERSAL-RNFA to assist;  Surgeon: Leafy Ro, MD;  Location: [facility];  Service: General;  Laterality: N/A;   CYSTECTOMY Left    OVARY   DILATION AND CURETTAGE OF UTERUS  2008   EXCISION MASS NECK N/A 07/25/2022   Procedure: EXCISION MASS NECK, posterior;  Surgeon: Leafy Ro, MD;  Location: [facility];  Service: General;  Laterality: N/A;   LAPAROSCOPIC LYSIS OF ADHESIONS N/A 02/16/2018   Procedure: EXTENSIVE LAPAROSCOPIC LYSIS OF ADHESIONS;  Surgeon: Suzy Bouchard, MD;  Location: [facility];  Service: Gynecology;  Laterality: N/A;   LAPAROSCOPY N/A 02/16/2018   Procedure: LAPAROSCOPY OPERATIVE;  Surgeon: Schermerhorn, Ihor Austin, MD;  Location: [facility];  Service: Gynecology;  Laterality: N/A;   LAPAROTOMY N/A 03/27/2022   Procedure: EXPLORATORY LAPAROTOMY;  Surgeon: Leafy Ro, MD;  Location: [facility];  Service: General;  Laterality: N/A;   LYSIS OF ADHESION N/A 03/18/2022   Procedure: LYSIS OF ADHESION;  Surgeon: Hildred Laser, MD;  Location: [facility];  Service: Gynecology;  Laterality: N/A;   XI ROBOTIC ASSISTED SALPINGECTOMY Left 03/18/2022   Procedure: XI ROBOTIC ASSISTED SALPINGECTOMY;  Surgeon: Hildred Laser, MD;  Location: [facility];  Service: Gynecology;  Laterality: Left;    Home Medications: Prior to Admission medications   Not on File    Allergies: No Active Allergies  Review of Systems: Review of Systems  Constitutional:  Negative for chills and fever.  Respiratory:  Negative for cough and shortness of breath.   Cardiovascular:  Negative for chest pain and palpitations.  Gastrointestinal:  Negative for  abdominal pain, blood in stool, constipation, diarrhea, nausea and vomiting.  Genitourinary:  Negative for dysuria and urgency.  All other systems reviewed and are negative.   Physical Exam BP 117/82   Pulse (!) 101   Temp 98 F (36.7 C)   Ht 5\' 3"  (1.6 m)   Wt 202 lb (91.6 kg)   LMP 03/24/2023 (Exact Date)   SpO2 97%   BMI 35.78 kg/m   Physical Exam Vitals and nursing note reviewed.  Constitutional:      General: She is not in acute distress.    Appearance: Normal appearance. She is not ill-appearing.  HENT:     Head: Normocephalic and atraumatic.  Eyes:     General: No scleral icterus.    Conjunctiva/sclera: Conjunctivae normal.  Cardiovascular:     Rate and Rhythm: Normal rate.     Pulses: Normal pulses.  Pulmonary:     Effort: Pulmonary effort is normal. No respiratory distress.  Abdominal:     General: A surgical scar is present. There is no distension.     Palpations: Abdomen is soft.     Tenderness: There is no abdominal tenderness.     Hernia: No hernia is present.     Comments: Abdomen is soft, non-tender, non-distended, no rebound/guarding. Laparotomy and colostomy incisions are well healed; no evidence of hernia   Genitourinary:    Comments: Deferred Skin:    General: Skin is warm and dry.  Neurological:     General: No focal deficit present.     Mental Status: She is alert and oriented to person, place, and time. Mental status is at baseline.  Psychiatric:        Mood and Affect: Mood normal.        Behavior: Behavior normal.     Labs/Imaging: CBC    Component Value Date/Time   WBC 11.1 (H) 03/04/2023 1429   RBC 5.17 (H) 03/04/2023 1429   HGB 14.6 03/04/2023 1429   HGB 15.7 11/22/2020 1119   HCT 42.3 03/04/2023 1429   HCT 45.1 11/22/2020 1119   PLT 290 03/04/2023 1429   PLT 259 11/22/2020 1119   MCV 81.8 03/04/2023 1429   MCV 87 11/22/2020 1119   MCH 28.2 03/04/2023 1429   MCHC 34.5 03/04/2023 1429   RDW 12.8 03/04/2023 1429   RDW 12.9  11/22/2020 1119   LYMPHSABS 2.3 04/22/2022 1623   LYMPHSABS 2.2 11/22/2020 1119   MONOABS 0.4 04/22/2022 1623   EOSABS 0.2 04/22/2022 1623   EOSABS 0.2 11/22/2020 1119   BASOSABS 0.0 04/22/2022 1623   BASOSABS 0.1 11/22/2020 1119       Latest Ref Rng & Units 03/04/2023    2:29 PM 10/18/2022    4:36 AM 10/17/2022    4:47 AM  BMP  Glucose 70 - 99 mg/dL 96  94  95   BUN 6 - 20 mg/dL 15  <5  <5   Creatinine 0.44 - 1.00 mg/dL 5.28  4.13  2.44   Sodium 135 - 145 mmol/L 135  140  138   Potassium 3.5 - 5.1 mmol/L 4.3  3.6  3.4   Chloride 98 - 111 mmol/L 99  107  109   CO2 22 - 32 mmol/L 26  26  22    Calcium 8.9 - 10.3 mg/dL 9.4  8.2  7.8      CT Abdomen/Pelvis (03/11/2023) personally reviewed and agree with radiologist report:  IMPRESSION: 1. Well-circumscribed peripherally enhancing cystic focus located between the posterior vaginal wall and the anal canal measuring 15 mm, nonspecific possibly reflecting a small abscess developing fistulous connection or less likely a rare location of a Gartner's duct cyst. Suggest correlation with physical examination and consider further evaluation by contrast enhanced pelvic MRI. 2. Similar appearance of the cystic lesion in the right anterior vaginal wall likely reflecting a Bartholin's gland cyst. 3. Right-sided hydrosalpinx. 4. Diffuse hepatic steatosis   Assessment and Plan: This is a 37 y.o. female s/p exploratory laparotomy and colostomy creation on 03/27/2022 and subsequent open colostomy takedown and appendectomy with Dr Everlene Farrier on 10/15/2022.    - Nothing further from surgical perspective  - Okay to utilize bowel regimen (ie: Miralax) as needed  - No further need for imaging nor laboratory draw  - We will remain readily available as needed. She understands to call with any questions.concerns   Face-to-face time spent with the patient and care providers was 20 minutes, with more than 50% of the time spent counseling, educating, and  coordinating care of the patient.     Lynden Oxford, PA-C  [facility] 04/01/2023, 1:52 PM M-F: 7am - 4pm

## 2023-04-02 NOTE — Progress Notes (Deleted)
Name: Lacey Harrison   MRN: [id number]    DOB: Apr 15, 1986   Date:04/02/2023       Progress Note  Subjective  Chief Complaint  No chief complaint on file.   HPI  Patient presents for annual CPE.  Diet: *** Exercise: ***  Last Eye Exam: *** Last Dental Exam: ***  Flowsheet Row Office Visit from 11/21/2020 in [facility]  AUDIT-C Score 4      Depression: Phq 9 is  {Desc; negative/positive:13464}    05/13/2022    2:32 PM 04/19/2022    9:16 AM 11/21/2020    1:27 PM 02/02/2019    3:00 PM  Depression screen PHQ 2/9  Decreased Interest 3 0 3 0  Down, Depressed, Hopeless 1 0 2 1  PHQ - 2 Score 4 0 5 1  Altered sleeping 3 0 3   Tired, decreased energy 3 0 3   Change in appetite 1 0 3   Feeling bad or failure about yourself  0 0 3   Trouble concentrating 3 0 3   Moving slowly or fidgety/restless 0 0 0   Suicidal thoughts 0 0 0   PHQ-9 Score 14 0 20   Difficult doing work/chores Extremely dIfficult Not difficult at all Very difficult    Hypertension: BP Readings from Last 3 Encounters:  04/01/23 117/82  03/04/23 108/67  11/28/22 118/82   Obesity: Wt Readings from Last 3 Encounters:  04/01/23 202 lb (91.6 kg)  03/04/23 200 lb 3.2 oz (90.8 kg)  11/28/22 191 lb (86.6 kg)   BMI Readings from Last 3 Encounters:  04/01/23 35.78 kg/m  03/04/23 35.46 kg/m  11/28/22 33.83 kg/m     Vaccines:   HPV: N./a Tdap: 07/01/18 Shingrix: N/a Pneumonia: N/a Flu:  COVID-19:N/a   Hep C Screening: complete STD testing and prevention (HIV/chl/gon/syphilis):HIV complete  Intimate partner violence: {Desc; negative/positive:13464} screen  Sexual History : Menstrual History/LMP/Abnormal Bleeding:  Discussed importance of follow up if any post-menopausal bleeding: {Response; yes/no/na:63}  Incontinence Symptoms: {Desc; negative/positive:13464} for symptoms   Breast cancer:  - Last Mammogram: N/a - BRCA gene screening:   Osteoporosis Prevention :  Discussed high calcium and vitamin D supplementation, weight bearing exercises Bone density :not applicable   Cervical cancer screening: complete 02/15/21  Skin cancer: Discussed monitoring for atypical lesions  Colorectal cancer: N/a   Lung cancer:  Low Dose CT Chest recommended if Age 37-80 years, 20 pack-year currently smoking OR have quit w/in 15years. Patient does not qualify for screen   ECG: last done 04/23/22  Advanced Care Planning: A voluntary discussion about advance care planning including the explanation and discussion of advance directives.  Discussed health care proxy and Living will, and the patient was able to identify a health care proxy as ***.  Patient does not have a living will and power of attorney of health care   Lipids: Lab Results  Component Value Date   CHOL 206 (H) 04/19/2022   CHOL 238 (H) 05/18/2021   CHOL 230 (H) 01/30/2021   Lab Results  Component Value Date   HDL 29 (L) 04/19/2022   HDL 36.90 (L) 05/18/2021   HDL 42.10 01/30/2021   Lab Results  Component Value Date   LDLCALC 133 (H) 04/19/2022   LDLCALC 163 (H) 05/18/2021   LDLCALC 153 (H) 11/22/2020   Lab Results  Component Value Date   TRIG 310 (H) 04/19/2022   TRIG 191 (H) 04/04/2022   TRIG 318 (H) 04/01/2022   Lab  Results  Component Value Date   CHOLHDL 7.1 (H) 04/19/2022   CHOLHDL 6 05/18/2021   CHOLHDL 5 01/30/2021   Lab Results  Component Value Date   LDLDIRECT 140.0 01/30/2021    Glucose: Glucose, Bld  Date Value Ref Range Status  03/04/2023 96 70 - 99 mg/dL Final    Comment:    Glucose reference range applies only to samples taken after fasting for at least 8 hours.  10/18/2022 94 70 - 99 mg/dL Final    Comment:    Glucose reference range applies only to samples taken after fasting for at least 8 hours.  10/17/2022 95 70 - 99 mg/dL Final    Comment:    Glucose reference range applies only to samples taken after fasting for at least 8 hours.   Glucose-Capillary   Date Value Ref Range Status  04/07/2022 100 (H) 70 - 99 mg/dL Final    Comment:    Glucose reference range applies only to samples taken after fasting for at least 8 hours.  04/06/2022 110 (H) 70 - 99 mg/dL Final    Comment:    Glucose reference range applies only to samples taken after fasting for at least 8 hours.  04/06/2022 103 (H) 70 - 99 mg/dL Final    Comment:    Glucose reference range applies only to samples taken after fasting for at least 8 hours.    Patient Active Problem List   Diagnosis Date Noted   Colostomy in place [facility]) 10/15/2022   S/P colostomy takedown 10/15/2022   Endometritis 08/12/2022   Neck mass 07/25/2022   Hypertriglyceridemia 04/19/2022   Pelvic abscess in female    Abdominal pain 03/26/2022   Acute respiratory failure with hypoxia (HCC) in seting of MSKpain, atelectasis, severe sepsis  03/25/2022   Severe sepsis (HCC) without septic shock, d/t postoperative abscess, s/p pelvic drain placement  03/25/2022   Ileus, postoperative (HCC) 03/25/2022   AKI (acute kidney injury) (HCC) in setting of sepsis, IV contrast, NAGMA - RESOLVED  03/25/2022   Postoperative intra-abdominal abscess 03/21/2022   Hydrosalpinx    Infertility, female    Pelvic adhesive disease    History of ovarian cyst 11/21/2020   Menstrual irregularity 11/21/2020   Vitamin D deficiency 11/21/2020   Screening for blood or protein in urine 11/21/2020   Fatigue 11/21/2020   Swollen lymph nodes 11/21/2020   Otitis externa 11/21/2020   Seasonal allergies 11/21/2020   Tobacco use 07/12/2019   Lyme disease 02/02/2019   Migraines 02/02/2019   Blepharitis 12/25/2018   Depression 03/07/2016    Past Surgical History:  Procedure Laterality Date   APPENDECTOMY  10/15/2022   Procedure: APPENDECTOMY;  Surgeon: Leafy Ro, MD;  Location: [facility] ORS;  Service: General;;   CHROMOPERTUBATION  02/16/2018   Procedure: CHROMOPERTUBATION;  Surgeon: Suzy Bouchard, MD;  Location: [facility]  ORS;  Service: Gynecology;;   CHROMOPERTUBATION Left 03/18/2022   Procedure: CHROMOPERTUBATION;  Surgeon: Hildred Laser, MD;  Location: [facility] ORS;  Service: Gynecology;  Laterality: Left;   COLECTOMY WITH COLOSTOMY CREATION/HARTMANN PROCEDURE N/A 03/27/2022   Procedure: SIGMOID COLECTOMY WITH COLOSTOMY CREATION/HARTMANN PROCEDURE; LYSIS OF ADHESIONS;  Surgeon: Leafy Ro, MD;  Location: [facility] ORS;  Service: General;  Laterality: N/A;   COLOSTOMY REVERSAL N/A 10/15/2022   Procedure: COLOSTOMY REVERSAL-RNFA to assist;  Surgeon: Leafy Ro, MD;  Location: [facility] ORS;  Service: General;  Laterality: N/A;   CYSTECTOMY Left    OVARY   DILATION AND CURETTAGE OF UTERUS  2008  EXCISION MASS NECK N/A 07/25/2022   Procedure: EXCISION MASS NECK, posterior;  Surgeon: Leafy Ro, MD;  Location: [facility] ORS;  Service: General;  Laterality: N/A;   LAPAROSCOPIC LYSIS OF ADHESIONS N/A 02/16/2018   Procedure: EXTENSIVE LAPAROSCOPIC LYSIS OF ADHESIONS;  Surgeon: Suzy Bouchard, MD;  Location: [facility] ORS;  Service: Gynecology;  Laterality: N/A;   LAPAROSCOPY N/A 02/16/2018   Procedure: LAPAROSCOPY OPERATIVE;  Surgeon: Schermerhorn, Ihor Austin, MD;  Location: [facility] ORS;  Service: Gynecology;  Laterality: N/A;   LAPAROTOMY N/A 03/27/2022   Procedure: EXPLORATORY LAPAROTOMY;  Surgeon: Leafy Ro, MD;  Location: [facility] ORS;  Service: General;  Laterality: N/A;   LYSIS OF ADHESION N/A 03/18/2022   Procedure: LYSIS OF ADHESION;  Surgeon: Hildred Laser, MD;  Location: [facility] ORS;  Service: Gynecology;  Laterality: N/A;   XI ROBOTIC ASSISTED SALPINGECTOMY Left 03/18/2022   Procedure: XI ROBOTIC ASSISTED SALPINGECTOMY;  Surgeon: Hildred Laser, MD;  Location: [facility] ORS;  Service: Gynecology;  Laterality: Left;    Family History  Problem Relation Age of Onset   Mental illness Mother    Mental illness Father    Mental illness Maternal Grandmother    Mental illness Paternal Grandmother     Social History    Socioeconomic History   Marital status: Married    Spouse name: Alycia Rossetti   Number of children: Not on file   Years of education: Not on file   Highest education level: Not on file  Occupational History   Not on file  Tobacco Use   Smoking status: Some Days    Current packs/day: 0.00    Average packs/day: 0.8 packs/day for 14.0 years (10.5 ttl pk-yrs)    Types: Cigarettes    Start date: 07/05/2002    Last attempt to quit: 07/05/2016    Years since quitting: 6.7    Passive exposure: Past   Smokeless tobacco: Never  Vaping Use   Vaping status: Former  Substance and Sexual Activity   Alcohol use: Not Currently    Comment: previous heavy drinke wine on occasion now 10/07/22   Drug use: No   Sexual activity: Yes    Birth control/protection: None  Other Topics Concern   Not on file  Social History Narrative   Not on file   Social Determinants of Health   Financial Resource Strain: Not on file  Food Insecurity: No Food Insecurity (10/15/2022)   Hunger Vital Sign    Worried About Running Out of Food in the Last Year: Never true    Ran Out of Food in the Last Year: Never true  Transportation Needs: No Transportation Needs (10/15/2022)   PRAPARE - Administrator, Civil Service (Medical): No    Lack of Transportation (Non-Medical): No  Physical Activity: Not on file  Stress: Not on file  Social Connections: Not on file  Intimate Partner Violence: Not At Risk (10/15/2022)   Humiliation, Afraid, Rape, and Kick questionnaire    Fear of Current or Ex-Partner: No    Emotionally Abused: No    Physically Abused: No    Sexually Abused: No    No current outpatient medications on file.  No Active Allergies   ROS  ***  Objective  There were no vitals filed for this visit.  There is no height or weight on file to calculate BMI.  Physical Exam ***  Recent Results (from the past 2160 hour(s))  Basic metabolic panel     Status: None   Collection Time: 03/04/23  2:29 PM  Result Value Ref Range   Sodium 135 135 - 145 mmol/L   Potassium 4.3 3.5 - 5.1 mmol/L   Chloride 99 98 - 111 mmol/L   CO2 26 22 - 32 mmol/L   Glucose, Bld 96 70 - 99 mg/dL    Comment: Glucose reference range applies only to samples taken after fasting for at least 8 hours.   BUN 15 6 - 20 mg/dL   Creatinine, Ser 1.61 0.44 - 1.00 mg/dL   Calcium 9.4 8.9 - 09.6 mg/dL   GFR, Estimated >04 >54 mL/min    Comment: (NOTE) Calculated using the CKD-EPI Creatinine Equation (2021)    Anion gap 10 5 - 15    Comment: Performed at [facility], [address] [place], [place] [address]  CBC     Status: Abnormal   Collection Time: 03/04/23  2:29 PM  Result Value Ref Range   WBC 11.1 (H) 4.0 - 10.5 K/uL   RBC 5.17 (H) 3.87 - 5.11 MIL/uL   Hemoglobin 14.6 12.0 - 15.0 g/dL   HCT 91.4 78.2 - 95.6 %   MCV 81.8 80.0 - 100.0 fL   MCH 28.2 26.0 - 34.0 pg   MCHC 34.5 30.0 - 36.0 g/dL   RDW 21.3 08.6 - 57.8 %   Platelets 290 150 - 400 K/uL   nRBC 0.0 0.0 - 0.2 %    Comment: Performed at [facility], [address] [place], [place] [address]     Fall Risk:    11/05/2022    1:36 PM 09/30/2022    2:24 PM 05/16/2022    1:46 PM 05/13/2022    2:31 PM 04/19/2022    9:16 AM  Fall Risk   Falls in the past year? 0 0 0 0 0  Number falls in past yr:    0 0  Injury with Fall?    0 0   ***  Functional Status Survey:   ***  Assessment & Plan  There are no diagnoses linked to this encounter.  -USPSTF grade A and B recommendations reviewed with patient; age-appropriate recommendations, preventive care, screening tests, etc discussed and encouraged; healthy living encouraged; see AVS for patient education given to patient -Discussed importance of 150 minutes of physical activity weekly, eat two servings of fish weekly, eat one serving of tree nuts ( cashews, pistachios, pecans, almonds.Marland Kitchen) every other day, eat 6 servings of fruit/vegetables daily and drink plenty of water and  avoid sweet beverages.   -Reviewed Health Maintenance: {yes [id number]}

## 2023-04-03 ENCOUNTER — Encounter: Payer: No Typology Code available for payment source | Admitting: Internal Medicine

## 2023-04-03 ENCOUNTER — Encounter: Payer: Self-pay | Admitting: Internal Medicine

## 2023-04-03 NOTE — Progress Notes (Signed)
Name: Lacey Harrison   MRN: [id number]    DOB: 08-May-1986   Date:04/04/2023       Progress Note  Subjective  Chief Complaint  Chief Complaint  Patient presents with   Annual Exam    HPI  Patient presents for annual CPE. Has several things to discuss today which include depression, dizziness, high triglycerides and breast pain. Dizziness occurs after standing up and walking, a few weeks ago felt a "rushing" and felt like she would pass out.   Flowsheet Row Office Visit from 11/21/2020 in [facility]  AUDIT-C Score 4      Depression: Phq 9 is  positive - anxiety and depression worse lately     04/04/2023    8:48 AM 04/04/2023    8:45 AM 05/13/2022    2:32 PM 04/19/2022    9:16 AM 11/21/2020    1:27 PM  Depression screen PHQ 2/9  Decreased Interest 3 3 3  0 3  Down, Depressed, Hopeless 3 3 1  0 2  PHQ - 2 Score 6 6 4  0 5  Altered sleeping 3  3 0 3  Tired, decreased energy 3  3 0 3  Change in appetite 3  1 0 3  Feeling bad or failure about yourself  1  0 0 3  Trouble concentrating 2  3 0 3  Moving slowly or fidgety/restless 0  0 0 0  Suicidal thoughts 0  0 0 0  PHQ-9 Score 18  14 0 20  Difficult doing work/chores Very difficult  Extremely dIfficult Not difficult at all Very difficult   Hypertension: BP Readings from Last 3 Encounters:  04/04/23 120/74  04/01/23 117/82  03/04/23 108/67   Obesity: Wt Readings from Last 3 Encounters:  04/04/23 200 lb 4.8 oz (90.9 kg)  04/01/23 202 lb (91.6 kg)  03/04/23 200 lb 3.2 oz (90.8 kg)   BMI Readings from Last 3 Encounters:  04/04/23 35.48 kg/m  04/01/23 35.78 kg/m  03/04/23 35.46 kg/m     Vaccines:   HPV: N./a Tdap: 07/01/18 Shingrix: N/a Pneumonia: N/a Flu: will obtain this fall  COVID-19:N/a   Hep C Screening: complete STD testing and prevention (HIV/chl/gon/syphilis):HIV complete  Menstrual History/LMP/Abnormal Bleeding: following with Gynecology  Discussed importance of follow up if any  post-menopausal bleeding: not applicable  Incontinence Symptoms: negative for symptoms   Breast cancer:  - Last Mammogram: Discussed obtaining an age 33, did have an [place] in the past on the left breast that was normal. Patient states she now has left breast pain and noticed it is larger compared to the right. No lumps or masses felt.   Osteoporosis Prevention : Discussed high calcium and vitamin D supplementation, weight bearing exercises Bone density :not applicable   Cervical cancer screening: complete 02/15/21  Skin cancer: Discussed monitoring for atypical lesions  Colorectal cancer: Discussed obtaining an age 46 Lung cancer:  Low Dose CT Chest recommended if Age 110-80 years, 20 pack-year currently smoking OR have quit w/in 15years. Patient does not qualify for screen   ECG: last done 04/23/22   Lipids: Lab Results  Component Value Date   CHOL 206 (H) 04/19/2022   CHOL 238 (H) 05/18/2021   CHOL 230 (H) 01/30/2021   Lab Results  Component Value Date   HDL 29 (L) 04/19/2022   HDL 36.90 (L) 05/18/2021   HDL 42.10 01/30/2021   Lab Results  Component Value Date   LDLCALC 133 (H) 04/19/2022   LDLCALC 163 (H) 05/18/2021  LDLCALC 153 (H) 11/22/2020   Lab Results  Component Value Date   TRIG 310 (H) 04/19/2022   TRIG 191 (H) 04/04/2022   TRIG 318 (H) 04/01/2022   Lab Results  Component Value Date   CHOLHDL 7.1 (H) 04/19/2022   CHOLHDL 6 05/18/2021   CHOLHDL 5 01/30/2021   Lab Results  Component Value Date   LDLDIRECT 140.0 01/30/2021    Glucose: Glucose, Bld  Date Value Ref Range Status  03/04/2023 96 70 - 99 mg/dL Final    Comment:    Glucose reference range applies only to samples taken after fasting for at least 8 hours.  10/18/2022 94 70 - 99 mg/dL Final    Comment:    Glucose reference range applies only to samples taken after fasting for at least 8 hours.  10/17/2022 95 70 - 99 mg/dL Final    Comment:    Glucose reference range applies only to samples  taken after fasting for at least 8 hours.   Glucose-Capillary  Date Value Ref Range Status  04/07/2022 100 (H) 70 - 99 mg/dL Final    Comment:    Glucose reference range applies only to samples taken after fasting for at least 8 hours.  04/06/2022 110 (H) 70 - 99 mg/dL Final    Comment:    Glucose reference range applies only to samples taken after fasting for at least 8 hours.  04/06/2022 103 (H) 70 - 99 mg/dL Final    Comment:    Glucose reference range applies only to samples taken after fasting for at least 8 hours.    Patient Active Problem List   Diagnosis Date Noted   Colostomy in place [facility]) 10/15/2022   S/P colostomy takedown 10/15/2022   Endometritis 08/12/2022   Neck mass 07/25/2022   Hypertriglyceridemia 04/19/2022   Pelvic abscess in female    Abdominal pain 03/26/2022   Acute respiratory failure with hypoxia (HCC) in seting of MSKpain, atelectasis, severe sepsis  03/25/2022   Severe sepsis (HCC) without septic shock, d/t postoperative abscess, s/p pelvic drain placement  03/25/2022   Ileus, postoperative (HCC) 03/25/2022   AKI (acute kidney injury) (HCC) in setting of sepsis, IV contrast, NAGMA - RESOLVED  03/25/2022   Postoperative intra-abdominal abscess 03/21/2022   Hydrosalpinx    Infertility, female    Pelvic adhesive disease    History of ovarian cyst 11/21/2020   Menstrual irregularity 11/21/2020   Vitamin D deficiency 11/21/2020   Screening for blood or protein in urine 11/21/2020   Fatigue 11/21/2020   Swollen lymph nodes 11/21/2020   Otitis externa 11/21/2020   Seasonal allergies 11/21/2020   Tobacco use 07/12/2019   Lyme disease 02/02/2019   Migraines 02/02/2019   Blepharitis 12/25/2018   Depression 03/07/2016    Past Surgical History:  Procedure Laterality Date   APPENDECTOMY  10/15/2022   Procedure: APPENDECTOMY;  Surgeon: Leafy Ro, MD;  Location: [facility];  Service: General;;   CHROMOPERTUBATION  02/16/2018   Procedure:  CHROMOPERTUBATION;  Surgeon: Suzy Bouchard, MD;  Location: [facility];  Service: Gynecology;;   CHROMOPERTUBATION Left 03/18/2022   Procedure: CHROMOPERTUBATION;  Surgeon: Hildred Laser, MD;  Location: [facility];  Service: Gynecology;  Laterality: Left;   COLECTOMY WITH COLOSTOMY CREATION/HARTMANN PROCEDURE N/A 03/27/2022   Procedure: SIGMOID COLECTOMY WITH COLOSTOMY CREATION/HARTMANN PROCEDURE; LYSIS OF ADHESIONS;  Surgeon: Leafy Ro, MD;  Location: [facility];  Service: General;  Laterality: N/A;   COLOSTOMY REVERSAL N/A 10/15/2022   Procedure: COLOSTOMY REVERSAL-RNFA to assist;  Surgeon: Leafy Ro, MD;  Location: [facility];  Service: General;  Laterality: N/A;   CYSTECTOMY Left    OVARY   DILATION AND CURETTAGE OF UTERUS  2008   EXCISION MASS NECK N/A 07/25/2022   Procedure: EXCISION MASS NECK, posterior;  Surgeon: Leafy Ro, MD;  Location: [facility];  Service: General;  Laterality: N/A;   LAPAROSCOPIC LYSIS OF ADHESIONS N/A 02/16/2018   Procedure: EXTENSIVE LAPAROSCOPIC LYSIS OF ADHESIONS;  Surgeon: Suzy Bouchard, MD;  Location: [facility];  Service: Gynecology;  Laterality: N/A;   LAPAROSCOPY N/A 02/16/2018   Procedure: LAPAROSCOPY OPERATIVE;  Surgeon: Schermerhorn, Ihor Austin, MD;  Location: [facility];  Service: Gynecology;  Laterality: N/A;   LAPAROTOMY N/A 03/27/2022   Procedure: EXPLORATORY LAPAROTOMY;  Surgeon: Leafy Ro, MD;  Location: [facility];  Service: General;  Laterality: N/A;   LYSIS OF ADHESION N/A 03/18/2022   Procedure: LYSIS OF ADHESION;  Surgeon: Hildred Laser, MD;  Location: [facility];  Service: Gynecology;  Laterality: N/A;   XI ROBOTIC ASSISTED SALPINGECTOMY Left 03/18/2022   Procedure: XI ROBOTIC ASSISTED SALPINGECTOMY;  Surgeon: Hildred Laser, MD;  Location: [facility];  Service: Gynecology;  Laterality: Left;    Family History  Problem Relation Age of Onset   Mental illness Mother    Mental illness Father    Mental illness Maternal  Grandmother    Mental illness Paternal Grandmother     Social History   Socioeconomic History   Marital status: Married    Spouse name: Alycia Rossetti   Number of children: Not on file   Years of education: Not on file   Highest education level: Not on file  Occupational History   Not on file  Tobacco Use   Smoking status: Some Days    Current packs/day: 0.00    Average packs/day: 0.8 packs/day for 14.0 years (10.5 ttl pk-yrs)    Types: Cigarettes    Start date: 07/05/2002    Last attempt to quit: 07/05/2016    Years since quitting: 6.7    Passive exposure: Past   Smokeless tobacco: Never  Vaping Use   Vaping status: Former  Substance and Sexual Activity   Alcohol use: Not Currently    Comment: previous heavy drinke wine on occasion now 10/07/22   Drug use: No   Sexual activity: Yes    Birth control/protection: None  Other Topics Concern   Not on file  Social History Narrative   Not on file   Social Determinants of Health   Financial Resource Strain: Not on file  Food Insecurity: No Food Insecurity (10/15/2022)   Hunger Vital Sign    Worried About Running Out of Food in the Last Year: Never true    Ran Out of Food in the Last Year: Never true  Transportation Needs: No Transportation Needs (10/15/2022)   PRAPARE - Administrator, Civil Service (Medical): No    Lack of Transportation (Non-Medical): No  Physical Activity: Not on file  Stress: Not on file  Social Connections: Not on file  Intimate Partner Violence: Not At Risk (10/15/2022)   Humiliation, Afraid, Rape, and Kick questionnaire    Fear of Current or Ex-Partner: No    Emotionally Abused: No    Physically Abused: No    Sexually Abused: No     Current Outpatient Medications:    buPROPion (WELLBUTRIN XL) 150 MG 24 hr tablet, Take 1 tablet (150 mg total) by mouth daily., Disp: 30 tablet, Rfl: 1  No  Active Allergies   Review of Systems  Neurological:  Positive for dizziness.  Psychiatric/Behavioral:   Positive for depression. The patient is nervous/anxious and has insomnia.   All other systems reviewed and are negative.    Objective  Vitals:   04/04/23 0842  BP: 120/74  Pulse: (!) 112  Resp: 16  Temp: 97.9 F (36.6 C)  SpO2: 97%  Weight: 200 lb 4.8 oz (90.9 kg)  Height: 5\' 3"  (1.6 m)    Body mass index is 35.48 kg/m.  Physical Exam Constitutional:      Appearance: Normal appearance.  HENT:     Head: Normocephalic and atraumatic.     Mouth/Throat:     Mouth: Mucous membranes are moist.     Pharynx: Oropharynx is clear.  Eyes:     Extraocular Movements: Extraocular movements intact.     Conjunctiva/sclera: Conjunctivae normal.     Pupils: Pupils are equal, round, and reactive to light.  Neck:     Comments: No thyromegaly  Cardiovascular:     Rate and Rhythm: Normal rate and regular rhythm.  Pulmonary:     Effort: Pulmonary effort is normal.     Breath sounds: Normal breath sounds.  Musculoskeletal:     Cervical back: No tenderness.     Right lower leg: No edema.     Left lower leg: No edema.  Lymphadenopathy:     Cervical: No cervical adenopathy.  Skin:    General: Skin is warm and dry.  Neurological:     General: No focal deficit present.     Mental Status: She is alert. Mental status is at baseline.  Psychiatric:        Mood and Affect: Mood normal.        Behavior: Behavior normal.     Recent Results (from the past 2160 hour(s))  Basic metabolic panel     Status: None   Collection Time: 03/04/23  2:29 PM  Result Value Ref Range   Sodium 135 135 - 145 mmol/L   Potassium 4.3 3.5 - 5.1 mmol/L   Chloride 99 98 - 111 mmol/L   CO2 26 22 - 32 mmol/L   Glucose, Bld 96 70 - 99 mg/dL    Comment: Glucose reference range applies only to samples taken after fasting for at least 8 hours.   BUN 15 6 - 20 mg/dL   Creatinine, Ser 9.14 0.44 - 1.00 mg/dL   Calcium 9.4 8.9 - 78.2 mg/dL   GFR, Estimated >95 >62 mL/min    Comment: (NOTE) Calculated using the  CKD-EPI Creatinine Equation (2021)    Anion gap 10 5 - 15    Comment: Performed at [facility], [address] [place], [place] [address]  CBC     Status: Abnormal   Collection Time: 03/04/23  2:29 PM  Result Value Ref Range   WBC 11.1 (H) 4.0 - 10.5 K/uL   RBC 5.17 (H) 3.87 - 5.11 MIL/uL   Hemoglobin 14.6 12.0 - 15.0 g/dL   HCT 57.8 46.9 - 62.9 %   MCV 81.8 80.0 - 100.0 fL   MCH 28.2 26.0 - 34.0 pg   MCHC 34.5 30.0 - 36.0 g/dL   RDW 52.8 41.3 - 24.4 %   Platelets 290 150 - 400 K/uL   nRBC 0.0 0.0 - 0.2 %    Comment: Performed at [facility], [address], [place], [place] [address]     Fall Risk:    04/04/2023  8:43 AM 11/05/2022    1:36 PM 09/30/2022    2:24 PM 05/16/2022    1:46 PM 05/13/2022    2:31 PM  Fall Risk   Falls in the past year? 0 0 0 0 0  Number falls in past yr: 0    0  Injury with Fall? 0    0    Functional Status Survey: Is the patient deaf or have difficulty hearing?: No Does the patient have difficulty seeing, even when wearing glasses/contacts?: No Does the patient have difficulty concentrating, remembering, or making decisions?: Yes Does the patient have difficulty walking or climbing stairs?: No Does the patient have difficulty dressing or bathing?: No Does the patient have difficulty doing errands alone such as visiting a doctor's office or shopping?: No   Assessment & Plan  1. Annual physical exam/Hypertriglyceridemia/Vitamin D deficiency: Labs ordered. Reviewed lipid panel from last year, triglycerides high. Recommend OTC fish oil for now, can discuss starting something like Tricor if still elevated on labs.   - Lipid Profile - Vitamin D (25 hydroxy) - HgB A1c - TSH  2. Depression, unspecified depression type: Discussed starting medication for moods, is starting therapy soon. I think she could do well on Wellbutrin as it is weight neutral. Will follow up in 6 weeks.   - buPROPion (WELLBUTRIN XL) 150 MG 24 hr tablet;  Take 1 tablet (150 mg total) by mouth daily.  Dispense: 30 tablet; Refill: 1  3. Orthostatic hypotension: Will check some labs, increase hydration. Information printed for her regarding this. I do think anxiety it playing a role here, will recheck at follow up. Will also discuss breast pain more in depth and breast exam at follow up.    -USPSTF grade A and B recommendations reviewed with patient; age-appropriate recommendations, preventive care, screening tests, etc discussed and encouraged; healthy living encouraged; see AVS for patient education given to patient -Discussed importance of 150 minutes of physical activity weekly, eat two servings of fish weekly, eat one serving of tree nuts ( cashews, pistachios, pecans, almonds.Marland Kitchen) every other day, eat 6 servings of fruit/vegetables daily and drink plenty of water and avoid sweet beverages.   -Reviewed Health Maintenance: Yes.

## 2023-04-04 ENCOUNTER — Ambulatory Visit (INDEPENDENT_AMBULATORY_CARE_PROVIDER_SITE_OTHER): Payer: 59 | Admitting: Internal Medicine

## 2023-04-04 ENCOUNTER — Encounter: Payer: Self-pay | Admitting: Internal Medicine

## 2023-04-04 VITALS — BP 120/74 | HR 112 | Temp 97.9°F | Resp 16 | Ht 63.0 in | Wt 200.3 lb

## 2023-04-04 DIAGNOSIS — F32A Depression, unspecified: Secondary | ICD-10-CM

## 2023-04-04 DIAGNOSIS — E781 Pure hyperglyceridemia: Secondary | ICD-10-CM | POA: Diagnosis not present

## 2023-04-04 DIAGNOSIS — E559 Vitamin D deficiency, unspecified: Secondary | ICD-10-CM | POA: Diagnosis not present

## 2023-04-04 DIAGNOSIS — I951 Orthostatic hypotension: Secondary | ICD-10-CM | POA: Diagnosis not present

## 2023-04-04 DIAGNOSIS — Z0000 Encounter for general adult medical examination without abnormal findings: Secondary | ICD-10-CM

## 2023-04-04 MED ORDER — BUPROPION HCL ER (XL) 150 MG PO TB24: 150.0000 mg | ORAL_TABLET | Freq: Every day | ORAL | 1 refills | Status: DC

## 2023-04-04 NOTE — Patient Instructions (Addendum)
It was great seeing you today!  Plan discussed at today's visit: -Blood work ordered today, results will be uploaded to MyChart.  -Start Wellbutrin 150 XL mg   Follow up in: 6 weeks   Take care and let us know if you have any questions or concerns prior to your next visit.  Dr. Caralee Ates  Bupropion Extended-Release Tablets (Depression/Mood Disorders) What is this medication? BUPROPION (byoo PROE pee on) treats depression. It increases norepinephrine and dopamine in the brain, hormones that help regulate mood. It belongs to a group of medications called NDRIs. This medicine may be used for other purposes; ask your health care provider or pharmacist if you have questions. COMMON BRAND NAME(S): Aplenzin, Budeprion XL, Forfivo XL, Wellbutrin XL What should I tell my care team before I take this medication? They need to know if you have any of these conditions: An eating disorder, such as anorexia or bulimia Bipolar disorder or psychosis Diabetes or high blood sugar, treated with medication Glaucoma Head injury or brain tumor Heart disease, previous heart attack, or irregular heart beat High blood pressure Kidney disease Liver disease Seizures Suicidal thoughts, plans, or attempt by you or a family member Tourette syndrome Weight loss An unusual or allergic reaction to bupropion, other medications, foods, dyes, or preservatives Pregnant or trying to become pregnant Breastfeeding How should I use this medication? Take this medication by mouth with a glass of water. Follow the directions on the prescription label. You can take it with or without food. If it upsets your stomach, take it with food. Do not crush, chew, or cut these tablets. This medication is taken once daily at the same time each day. Do not take your medication more often than directed. Do not stop taking this medication suddenly except upon the advice of your care team. Stopping this medication too quickly may cause  serious side effects or your condition may worsen. A special MedGuide will be given to you by the pharmacist with each prescription and refill. Be sure to read this information carefully each time. Talk to your care team about the use of this medication in children. Special care may be needed. Overdosage: If you think you have taken too much of this medicine contact a poison control center or emergency room at once. NOTE: This medicine is only for you. Do not share this medicine with others. What if I miss a dose? If you miss a dose, skip the missed dose and take your next tablet at the regular time. Do not take double or extra doses. What may interact with this medication? Do not take this medication with any of the following: Linezolid MAOIs, such as Azilect, Carbex, Eldepryl, Marplan, Nardil, and Parnate Methylene blue (injected into a vein) Other medications that contain bupropion, such as Zyban This medication may also interact with the following: Alcohol Certain medications for anxiety or sleep Certain medications for blood pressure, such as metoprolol, propranolol Certain medications for HIV or AIDS, such as efavirenz, lopinavir, nelfinavir, ritonavir Certain medications for irregular heartbeat, such as propafenone, flecainide Certain medications for mental health conditions Certain medications for Parkinson disease, such as amantadine, levodopa Certain medications for seizures, such as carbamazepine, phenytoin, phenobarbital Cimetidine Clopidogrel Cyclophosphamide Digoxin Furazolidone Isoniazid Nicotine Orphenadrine Procarbazine Steroid medications, such as prednisone or cortisone Stimulant medications for attention disorders, weight loss, or to stay awake Tamoxifen Theophylline Thiotepa Ticlopidine Tramadol Warfarin This list may not describe all possible interactions. Give your health care provider a list of all the medicines, herbs,  non-prescription drugs, or dietary  supplements you use. Also tell them if you smoke, drink alcohol, or use illegal drugs. Some items may interact with your medicine. What should I watch for while using this medication? Tell your care team if your symptoms do not get better or if they get worse. Visit your care team for regular checks on your progress. Because it may take several weeks to see the full effects of this medication, it is important to continue your treatment as prescribed. This medication may cause thoughts of suicide or depression. This includes sudden changes in mood, behaviors, or thoughts. These changes can happen at any time but are more common in the beginning of treatment or after a change in dose. Call your care team right away if you experience these thoughts or worsening depression. This medication may cause mood and behavior changes, such as anxiety, nervousness, irritability, hostility, restlessness, excitability, hyperactivity, or trouble sleeping. These changes can happen at any time but are more common in the beginning of treatment or after a change in dose. Call your care team right away if you notice any of these symptoms. This medication may cause serious skin reactions. They can happen weeks to months after starting the medication. Contact your care team right away if you notice fevers or flu-like symptoms with a rash. The rash may be red or purple and then turn into blisters or peeling of the skin. You may also notice a red rash with swelling of the face, lips, or lymph nodes in your neck or under your arms. Avoid drinks that contain alcohol while taking this medication. Drinking large amounts of alcohol, using sleeping or anxiety medications, or quickly stopping the use of these agents while taking this medication may increase your risk for a seizure. This medication may affect your coordination, reaction time, or judgment. Do not drive or operate machinery until you know how this medication affects you. Do  not take this medication close to bedtime. It may prevent you from sleeping. Your mouth may get dry. Chewing sugarless gum or sucking hard candy and drinking plenty of water may help. Contact your care team if the problem does not go away or is severe. The tablet shell for some brands of this medication does not dissolve. This is normal. The tablet shell may appear whole in the stool. This is not a cause for concern. What side effects may I notice from receiving this medication? Side effects that you should report to your care team as soon as possible: Allergic reactions--skin rash, itching, hives, swelling of the face, lips, tongue, or throat Increase in blood pressure Mood and behavior changes--anxiety, nervousness, confusion, hallucinations, irritability, hostility, thoughts of suicide or self-harm, worsening mood, feelings of depression Redness, blistering, peeling, or loosening of the skin, including inside the mouth Seizures Sudden eye pain or change in vision such as blurry vision, seeing halos around lights, vision loss Side effects that usually do not require medical attention (report to your care team if they continue or are bothersome): Constipation Dizziness Dry mouth Loss of appetite Nausea Tremors or shaking Trouble sleeping This list may not describe all possible side effects. Call your doctor for medical advice about side effects. You may report side effects to FDA at 1-800-FDA-1088. Where should I keep my medication? Keep out of the reach of children and pets. Store at room temperature between 15 and 30 degrees C (59 and 86 degrees F). Throw away any unused medication after the expiration date. NOTE: This sheet  is a summary. It may not cover all possible information. If you have questions about this medicine, talk to your doctor, pharmacist, or health care provider.  2024 Elsevier/Gold Standard (2022-05-05 00:00:00)  Orthostatic Hypotension Blood pressure is a measurement  of how strongly, or weakly, your circulating blood is pressing against the walls of your arteries. Orthostatic hypotension is a drop in blood pressure that can happen when you change positions, such as when you go from lying down to standing. Arteries are blood vessels that carry blood from your heart throughout your body. When blood pressure is too low, you may not get enough blood to your brain or to the rest of your organs. Orthostatic hypotension can cause light-headedness, sweating, rapid heartbeat, blurred vision, and fainting. These symptoms require further investigation into the cause. What are the causes? Orthostatic hypotension can be caused by many things, including: Sudden changes in posture, such as standing up quickly after you have been sitting or lying down. Loss of blood (anemia) or loss of body fluids (dehydration). Heart problems, neurologic problems, or hormone problems. Pregnancy. Aging. The risk for this condition increases as you get older. Severe infection (sepsis). Certain medicines, such as medicines for high blood pressure or medicines that make the body lose excess fluids (diuretics). What are the signs or symptoms? Symptoms of this condition may include: Weakness, light-headedness, or dizziness. Sweating. Blurred vision. Tiredness (fatigue). Rapid heartbeat. Fainting, in severe cases. How is this diagnosed? This condition is diagnosed based on: Your symptoms and medical history. Your blood pressure measurements. Your health care provider will check your blood pressure when you are: Lying down. Sitting. Standing. A blood pressure reading is recorded as two numbers, such as "120 over 80" (or 120/80). The first ("top") number is called the systolic pressure. It is a measure of the pressure in your arteries as your heart beats. The second ("bottom") number is called the diastolic pressure. It is a measure of the pressure in your arteries when your heart relaxes  between beats. Blood pressure is measured in a unit called mmHg. Healthy blood pressure for most adults is 120/80 mmHg. Orthostatic hypotension is defined as a 20 mmHg drop in systolic pressure or a 10 mmHg drop in diastolic pressure within 3 minutes of standing. Other information or tests that may be used to diagnose orthostatic hypotension include: Your other vital signs, such as your heart rate and temperature. Blood tests. An electrocardiogram (ECG) or echocardiogram. A Holter monitor. This is a device you wear that records your heart rhythm continuously, usually for 24-48 hours. Tilt table test. For this test, you will be safely secured to a table that moves you from a lying position to an upright position. Your heart rhythm and blood pressure will be monitored during the test. How is this treated? This condition may be treated by: Changing your diet. This may involve eating more salt (sodium) or drinking more water. Changing the dosage of certain medicines you are taking that might be lowering your blood pressure. Correcting the underlying reason for the orthostatic hypotension. Wearing compression stockings. Taking medicines to raise your blood pressure. Avoiding actions that trigger symptoms. Follow these instructions at home: Medicines Take over-the-counter and prescription medicines only as told by your health care provider. Follow instructions from your health care provider about changing the dosage of your current medicines, if this applies. Do not stop or adjust any of your medicines on your own. Eating and drinking  Drink enough fluid to keep your urine pale yellow.  Eat extra salt only as directed. Do not add extra salt to your diet unless advised by your health care provider. Eat frequent, small meals. Avoid standing up suddenly after eating. General instructions  Get up slowly from lying down or sitting positions. This gives your blood pressure a chance to adjust. Avoid  hot showers and excessive heat as directed by your health care provider. Engage in regular physical activity as directed by your health care provider. If you have compression stockings, wear them as told. Keep all follow-up visits. This is important. Contact a health care provider if: You have a fever for more than 2-3 days. You feel more thirsty than usual. You feel dizzy or weak. Get help right away if: You have chest pain. You have a fast or irregular heartbeat. You become sweaty or feel light-headed. You feel short of breath. You faint. You have any symptoms of a stroke. "BE FAST" is an easy way to remember the main warning signs of a stroke: B - Balance. Signs are dizziness, sudden trouble walking, or loss of balance. E - Eyes. Signs are trouble seeing or a sudden change in vision. F - Face. Signs are sudden weakness or numbness of the face, or the face or eyelid drooping on one side. A - Arms. Signs are weakness or numbness in an arm. This happens suddenly and usually on one side of the body. S - Speech. Signs are sudden trouble speaking, slurred speech, or trouble understanding what people say. T - Time. Time to call emergency services. Write down what time symptoms started. You have other signs of a stroke, such as: A sudden, severe headache with no known cause. Nausea or vomiting. Seizure. These symptoms may represent a serious problem that is an emergency. Do not wait to see if the symptoms will go away. Get medical help right away. Call your local emergency services (911 in the [place] Do not drive yourself to the hospital. Summary Orthostatic hypotension is a sudden drop in blood pressure. It can cause light-headedness, sweating, rapid heartbeat, blurred vision, and fainting. Orthostatic hypotension can be diagnosed by having your blood pressure taken while lying down, sitting, and then standing. Treatment may involve changing your diet, wearing compression stockings, sitting  up slowly, adjusting your medicines, or correcting the underlying reason for the orthostatic hypotension. Get help right away if you have chest pain, a fast or irregular heartbeat, or symptoms of a stroke. This information is not intended to replace advice given to you by your health care provider. Make sure you discuss any questions you have with your health care provider. Document Revised: 10/26/2020 Document Reviewed: 10/26/2020 Elsevier Patient Education  2024 [employer].

## 2023-04-07 MED ORDER — VITAMIN D (ERGOCALCIFEROL) 1.25 MG (50000 UNIT) PO CAPS: 50000.0000 [IU] | ORAL_CAPSULE | ORAL | 0 refills | Status: DC

## 2023-04-07 NOTE — Addendum Note (Signed)
Addended by: Margarita Mail on: 04/07/2023 08:00 AM   Modules accepted: Orders

## 2023-04-29 ENCOUNTER — Other Ambulatory Visit: Payer: Self-pay | Admitting: Internal Medicine

## 2023-04-29 DIAGNOSIS — F32A Depression, unspecified: Secondary | ICD-10-CM

## 2023-05-01 NOTE — Telephone Encounter (Signed)
Requested Prescriptions  Pending Prescriptions Disp Refills   buPROPion (WELLBUTRIN XL) 150 MG 24 hr tablet [Pharmacy Med Name: BUPROPION HCL XL 150 MG TABLET] 90 tablet 1    Sig: TAKE 1 TABLET BY MOUTH EVERY DAY     Psychiatry: Antidepressants - bupropion Failed - 04/29/2023  2:31 PM      Failed - AST in normal range and within 360 days    AST  Date Value Ref Range Status  07/12/2022 84 (H) 15 - 41 U/L Final         Failed - ALT in normal range and within 360 days    ALT  Date Value Ref Range Status  07/12/2022 106 (H) 0 - 44 U/L Final         Passed - Cr in normal range and within 360 days    Creat  Date Value Ref Range Status  04/19/2022 0.74 0.50 - 0.97 mg/dL Final   Creatinine, Ser  Date Value Ref Range Status  03/04/2023 0.64 0.44 - 1.00 mg/dL Final         Passed - Completed PHQ-2 or PHQ-9 in the last 360 days      Passed - Last BP in normal range    BP Readings from Last 1 Encounters:  04/04/23 120/74         Passed - Valid encounter within last 6 months    Recent Outpatient Visits           3 weeks ago Annual physical exam   [facility] Margarita Mail, DO   11 months ago Lipoma of neck   [facility] Margarita Mail, DO   1 year ago Hospital discharge follow-up   [facility] Margarita Mail, DO   2 years ago Elevated ALT measurement   [facility] Flinchum, Eula Fried, FNP   2 years ago Fatigue, unspecified type   [facility] Flinchum, Eula Fried, FNP       Future Appointments             In 1 week Margarita Mail, DO  [facility]   In 3 weeks Deirdre Evener, MD [facility]

## 2023-05-11 ENCOUNTER — Encounter: Payer: Self-pay | Admitting: Internal Medicine

## 2023-05-14 ENCOUNTER — Ambulatory Visit: Payer: 59 | Admitting: Internal Medicine

## 2023-05-14 ENCOUNTER — Encounter: Payer: Self-pay | Admitting: Internal Medicine

## 2023-05-14 VITALS — BP 110/76 | HR 104 | Temp 98.0°F | Resp 16 | Ht 63.0 in | Wt 199.0 lb

## 2023-05-14 DIAGNOSIS — N644 Mastodynia: Secondary | ICD-10-CM

## 2023-05-14 DIAGNOSIS — K7581 Nonalcoholic steatohepatitis (NASH): Secondary | ICD-10-CM | POA: Diagnosis not present

## 2023-05-14 DIAGNOSIS — E782 Mixed hyperlipidemia: Secondary | ICD-10-CM | POA: Diagnosis not present

## 2023-05-14 DIAGNOSIS — R300 Dysuria: Secondary | ICD-10-CM | POA: Diagnosis not present

## 2023-05-14 DIAGNOSIS — F339 Major depressive disorder, recurrent, unspecified: Secondary | ICD-10-CM

## 2023-05-14 LAB — POCT URINALYSIS DIPSTICK
Bilirubin, UA: NEGATIVE
Blood, UA: NEGATIVE
Glucose, UA: NEGATIVE
Ketones, UA: NEGATIVE
Leukocytes, UA: NEGATIVE
Nitrite, UA: NEGATIVE
Odor: NORMAL
Protein, UA: NEGATIVE
Spec Grav, UA: 1.015 (ref 1.010–1.025)
Urobilinogen, UA: 0.2 U/dL
pH, UA: 5.5 (ref 5.0–8.0)

## 2023-05-14 NOTE — Progress Notes (Signed)
Established Patient Office Visit  Subjective   Patient ID: Lacey Harrison, female    DOB: December 10, 1985  Age: 37 y.o. MRN: [id number]  Chief Complaint  Patient presents with   Depression    Patient is here to follow up on MDD and a few other issues.  MDD: -Mood status: better -Current treatment: Wellbutrin 150 mg XL -Satisfied with current treatment?: yes -Duration of current treatment :  2 weeks -Side effects: no Medication compliance: excellent compliance Psychotherapy/counseling: yes current     05/14/2023    8:02 AM 04/04/2023    8:48 AM 04/04/2023    8:45 AM 05/13/2022    2:32 PM 04/19/2022    9:16 AM  Depression screen PHQ 2/9  Decreased Interest 0 3 3 3  0  Down, Depressed, Hopeless 0 3 3 1  0  PHQ - 2 Score 0 6 6 4  0  Altered sleeping 0 3  3 0  Tired, decreased energy 0 3  3 0  Change in appetite 0 3  1 0  Feeling bad or failure about yourself  0 1  0 0  Trouble concentrating 0 2  3 0  Moving slowly or fidgety/restless 0 0  0 0  Suicidal thoughts 0 0  0 0  PHQ-9 Score 0 18  14 0  Difficult doing work/chores Not difficult at all Very difficult  Extremely dIfficult Not difficult at all   NASH: -History of elevated LFT's - last checked 11/23 AST 84, ALT 106 -Hepatic steatosis seen on abdominal CT scan in July   HLD: -Medications: Nothing -Strong family history  -Last lipid panel: Lipid Panel     Component Value Date/Time   CHOL 258 (H) 04/04/2023 0940   CHOL 253 (H) 11/22/2020 1119   TRIG 356 (H) 04/04/2023 0940   HDL 41 (L) 04/04/2023 0940   HDL 40 11/22/2020 1119   CHOLHDL 6.3 (H) 04/04/2023 0940   VLDL 38.0 05/18/2021 0749   LDLCALC 162 (H) 04/04/2023 0940   LDLDIRECT 140.0 01/30/2021 1108   LABVLDL 60 (H) 11/22/2020 1119   Breast Pain: -Pain to palpation under left breast  -No cysts/masses palpated   UTI symptoms: -Had been on vacation a few weeks ago, was having some dysuria and increased urinary urgency. 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/2022   S/P colostomy takedown 10/15/2022   Endometritis 08/12/2022   Neck mass 07/25/2022   Hypertriglyceridemia 04/19/2022   Pelvic abscess in female    Abdominal pain 03/26/2022   Acute respiratory failure with hypoxia (HCC) in seting of MSKpain, atelectasis, severe sepsis  03/25/2022   Severe sepsis (HCC) without septic shock, d/t postoperative abscess, s/p pelvic drain placement  03/25/2022   Ileus, postoperative (HCC) 03/25/2022   AKI (acute kidney injury) (HCC) in setting of sepsis, IV contrast, NAGMA - RESOLVED  03/25/2022   Postoperative intra-abdominal abscess 03/21/2022   Hydrosalpinx    Infertility, female    Pelvic adhesive disease    History of ovarian cyst 11/21/2020   Menstrual irregularity 11/21/2020   Vitamin D deficiency 11/21/2020   Screening for blood or protein in urine 11/21/2020   Fatigue 11/21/2020   Swollen lymph nodes 11/21/2020   Otitis externa 11/21/2020   Seasonal allergies 11/21/2020   Tobacco use 07/12/2019   Lyme disease 02/02/2019   Migraines 02/02/2019   Blepharitis 12/25/2018   Depression 03/07/2016  Past Medical History:  Diagnosis Date   Acute respiratory failure (HCC) 03/2022   AKI (acute kidney injury) (HCC)    in setting of sepsi, IV contrast, NAGMA-RESOLVED   Allergy    Anxiety    Chicken pox    Colostomy in place [facility])    Depression    Heart murmur    Ileus (HCC)    Lyme disease    patient reports a remote history of lyme and alludes that she has "chronic lyme"   Migraines    MIGRAINES   Neck mass    Ovarian cyst    Pneumonia 07/2017   Sepsis (HCC) 03/2022   Tobacco use    Past Surgical History:  Procedure Laterality Date   APPENDECTOMY  10/15/2022   Procedure: APPENDECTOMY;  Surgeon: Leafy Ro, MD;  Location: [facility];  Service: General;;   CHROMOPERTUBATION  02/16/2018   Procedure:  CHROMOPERTUBATION;  Surgeon: Suzy Bouchard, MD;  Location: [facility];  Service: Gynecology;;   CHROMOPERTUBATION Left 03/18/2022   Procedure: CHROMOPERTUBATION;  Surgeon: Hildred Laser, MD;  Location: [facility];  Service: Gynecology;  Laterality: Left;   COLECTOMY WITH COLOSTOMY CREATION/HARTMANN PROCEDURE N/A 03/27/2022   Procedure: SIGMOID COLECTOMY WITH COLOSTOMY CREATION/HARTMANN PROCEDURE; LYSIS OF ADHESIONS;  Surgeon: Leafy Ro, MD;  Location: [facility];  Service: General;  Laterality: N/A;   COLOSTOMY REVERSAL N/A 10/15/2022   Procedure: COLOSTOMY REVERSAL-RNFA to assist;  Surgeon: Leafy Ro, MD;  Location: [facility];  Service: General;  Laterality: N/A;   CYSTECTOMY Left    OVARY   DILATION AND CURETTAGE OF UTERUS  2008   EXCISION MASS NECK N/A 07/25/2022   Procedure: EXCISION MASS NECK, posterior;  Surgeon: Leafy Ro, MD;  Location: [facility];  Service: General;  Laterality: N/A;   LAPAROSCOPIC LYSIS OF ADHESIONS N/A 02/16/2018   Procedure: EXTENSIVE LAPAROSCOPIC LYSIS OF ADHESIONS;  Surgeon: Suzy Bouchard, MD;  Location: [facility];  Service: Gynecology;  Laterality: N/A;   LAPAROSCOPY N/A 02/16/2018   Procedure: LAPAROSCOPY OPERATIVE;  Surgeon: Schermerhorn, Ihor Austin, MD;  Location: [facility];  Service: Gynecology;  Laterality: N/A;   LAPAROTOMY N/A 03/27/2022   Procedure: EXPLORATORY LAPAROTOMY;  Surgeon: Leafy Ro, MD;  Location: [facility];  Service: General;  Laterality: N/A;   LYSIS OF ADHESION N/A 03/18/2022   Procedure: LYSIS OF ADHESION;  Surgeon: Hildred Laser, MD;  Location: [facility];  Service: Gynecology;  Laterality: N/A;   XI ROBOTIC ASSISTED SALPINGECTOMY Left 03/18/2022   Procedure: XI ROBOTIC ASSISTED SALPINGECTOMY;  Surgeon: Hildred Laser, MD;  Location: [facility];  Service: Gynecology;  Laterality: Left;   Social History   Tobacco Use   Smoking status: Some Days    Current packs/day: 0.00    Average packs/day: 0.8 packs/day for 14.0  years (10.5 ttl pk-yrs)    Types: Cigarettes    Start date: 07/05/2002    Last attempt to quit: 07/05/2016    Years since quitting: 6.8    Passive exposure: Past   Smokeless tobacco: Never  Vaping Use   Vaping status: Former  Substance Use Topics   Alcohol use: Not Currently    Comment: previous heavy drinke wine on occasion now 10/07/22   Drug use: No   Social History   Socioeconomic History   Marital status: Married    Spouse name: Alycia Rossetti   Number of children: Not on file   Years of education: Not on file   Highest education level: Bachelor's degree (e.g., BA, AB,  BS)  Occupational History   Not on file  Tobacco Use   Smoking status: Some Days    Current packs/day: 0.00    Average packs/day: 0.8 packs/day for 14.0 years (10.5 ttl pk-yrs)    Types: Cigarettes    Start date: 07/05/2002    Last attempt to quit: 07/05/2016    Years since quitting: 6.8    Passive exposure: Past   Smokeless tobacco: Never  Vaping Use   Vaping status: Former  Substance and Sexual Activity   Alcohol use: Not Currently    Comment: previous heavy drinke wine on occasion now 10/07/22   Drug use: No   Sexual activity: Yes    Birth control/protection: None  Other Topics Concern   Not on file  Social History Narrative   Not on file   Social Determinants of Health   Financial Resource Strain: Low Risk  (05/11/2023)   Overall Financial Resource Strain (CARDIA)    Difficulty of Paying Living Expenses: Not hard at all  Food Insecurity: No Food Insecurity (05/11/2023)   Hunger Vital Sign    Worried About Running Out of Food in the Last Year: Never true    Ran Out of Food in the Last Year: Never true  Transportation Needs: No Transportation Needs (05/11/2023)   PRAPARE - Administrator, Civil Service (Medical): No    Lack of Transportation (Non-Medical): No  Physical Activity: Insufficiently Active (05/11/2023)   Exercise Vital Sign    Days of Exercise per Week: 2 days    Minutes of  Exercise per Session: 20 min  Stress: Stress Concern Present (05/11/2023)   [employer] of Occupational Health - Occupational Stress Questionnaire    Feeling of Stress : To some extent  Social Connections: Moderately Isolated (05/11/2023)   Social Connection and Isolation Panel [NHANES]    Frequency of Communication with Friends and Family: More than three times a week    Frequency of Social Gatherings with Friends and Family: Twice a week    Attends Religious Services: Never    Database administrator or Organizations: No    Attends Engineer, structural: Not on file    Marital Status: Married  Catering manager Violence: Not At Risk (10/15/2022)   Humiliation, Afraid, Rape, and Kick questionnaire    Fear of Current or Ex-Partner: No    Emotionally Abused: No    Physically Abused: No    Sexually Abused: No   Family Status  Relation Name Status   Mother  Alive   Father  Deceased   MGM  Alive   PGM  Deceased   MGF  Deceased   PGF  Deceased  No partnership data on file   Family History  Problem Relation Age of Onset   Mental illness Mother    Mental illness Father    Mental illness Maternal Grandmother    Mental illness Paternal Grandmother    No Active Allergies    Review of Systems  Constitutional:  Negative for chills and fever.  Respiratory:  Negative for shortness of breath.   Cardiovascular:  Negative for chest pain.  Gastrointestinal:  Negative for abdominal pain.  Genitourinary:  Positive for dysuria and urgency. Negative for flank pain, frequency and hematuria.      Objective:     BP 110/76   Pulse (!) 104   Temp 98 F (36.7 C) (Oral)   Resp 16   Ht 5\' 3"  (1.6 m)   Wt 199 lb (90.3  kg)   SpO2 98%   BMI 35.25 kg/m  BP Readings from Last 3 Encounters:  05/14/23 110/76  04/04/23 120/74  04/01/23 117/82   Wt Readings from Last 3 Encounters:  05/14/23 199 lb (90.3 kg)  04/04/23 200 lb 4.8 oz (90.9 kg)  04/01/23 202 lb (91.6 kg)       Physical Exam Exam conducted with a chaperone present.  Constitutional:      Appearance: Normal appearance.  HENT:     Head: Normocephalic and atraumatic.  Eyes:     Conjunctiva/sclera: Conjunctivae normal.  Cardiovascular:     Rate and Rhythm: Normal rate and regular rhythm.  Pulmonary:     Effort: Pulmonary effort is normal.     Breath sounds: Normal breath sounds.  Chest:  Breasts:    Right: Normal. No mass, skin change or tenderness.     Left: Tenderness present. No mass or skin change.  Lymphadenopathy:     Upper Body:     Right upper body: No supraclavicular, axillary or pectoral adenopathy.     Left upper body: No supraclavicular, axillary or pectoral adenopathy.  Skin:    General: Skin is warm and dry.  Neurological:     General: No focal deficit present.     Mental Status: She is alert. Mental status is at baseline.  Psychiatric:        Mood and Affect: Mood normal.        Behavior: Behavior normal.      No results found for any visits on 05/14/23.  Last CBC Lab Results  Component Value Date   WBC 11.1 (H) 03/04/2023   HGB 14.6 03/04/2023   HCT 42.3 03/04/2023   MCV 81.8 03/04/2023   MCH 28.2 03/04/2023   RDW 12.8 03/04/2023   PLT 290 03/04/2023   Last metabolic panel Lab Results  Component Value Date   GLUCOSE 96 03/04/2023   NA 135 03/04/2023   K 4.3 03/04/2023   CL 99 03/04/2023   CO2 26 03/04/2023   BUN 15 03/04/2023   CREATININE 0.64 03/04/2023   GFRNONAA >60 03/04/2023   CALCIUM 9.4 03/04/2023   PHOS 2.7 10/17/2022   PROT 8.3 (H) 07/12/2022   ALBUMIN 4.5 07/12/2022   LABGLOB 2.3 11/22/2020   AGRATIO 2.0 11/22/2020   BILITOT 1.4 (H) 07/12/2022   ALKPHOS 65 07/12/2022   AST 84 (H) 07/12/2022   ALT 106 (H) 07/12/2022   ANIONGAP 10 03/04/2023   Last lipids Lab Results  Component Value Date   CHOL 258 (H) 04/04/2023   HDL 41 (L) 04/04/2023   LDLCALC 162 (H) 04/04/2023   LDLDIRECT 140.0 01/30/2021   TRIG 356 (H) 04/04/2023    CHOLHDL 6.3 (H) 04/04/2023   Last hemoglobin A1c Lab Results  Component Value Date   HGBA1C 5.6 04/04/2023   Last thyroid functions Lab Results  Component Value Date   TSH 2.20 04/04/2023   Last vitamin D Lab Results  Component Value Date   VD25OH 24 (L) 04/04/2023   Last vitamin B12 and Folate No results found for: "VITAMINB12", "FOLATE"    The ASCVD Risk score (Arnett DK, et al., 2019) failed to calculate for the following reasons:   The 2019 ASCVD risk score is only valid for ages 16 to 67    Assessment & Plan:   1. Episode of recurrent major depressive disorder, unspecified depression episode severity (HCC): Improved, doing well on the Wellbutrin XL 150 mg, will continue current dose for now. Also in therapy.  2. NASH (nonalcoholic steatohepatitis): Recheck CMP, will assess for other causes of elevation but CT scan in July consistent with hepatitis steatosis. Discussed diet change and weight loss. Patient thinking about starting to try for a baby soon, will continue to discuss GLP-1's for weight loss after delivery.   - COMPLETE METABOLIC PANEL WITH GFR - Hepatitis, Acute - Antinuclear Antib (ANA) - Ceruloplasmin  3. Moderate mixed hyperlipidemia not requiring statin therapy: LDL and triglycerides elevated, again patient trying to get pregnant so will not start a statin now. Recommend diet change and starting over the counter fish oil, continue to monitor.   4. Breast pain: Breast exam benign, no masses palpated. Most likely rib or muscular pain, does not change with menstrual cycles. Recommend taking NSAID's as needed for pain.  5. Dysuria: UA negative for infection today.   - POCT Urinalysis Dipstick   Return in about 3 months (around 08/13/2023).    Margarita Mail, DO

## 2023-05-16 LAB — COMPLETE METABOLIC PANEL WITH GFR
AG Ratio: 1.8 (calc) (ref 1.0–2.5)
ALT: 75 U/L — ABNORMAL HIGH (ref 6–29)
AST: 53 U/L — ABNORMAL HIGH (ref 10–30)
Albumin: 4.8 g/dL (ref 3.6–5.1)
Alkaline phosphatase (APISO): 66 U/L (ref 31–125)
BUN: 11 mg/dL (ref 7–25)
CO2: 25 mmol/L (ref 20–32)
Calcium: 9.6 mg/dL (ref 8.6–10.2)
Chloride: 101 mmol/L (ref 98–110)
Creat: 0.72 mg/dL (ref 0.50–0.97)
Globulin: 2.7 g/dL (calc) (ref 1.9–3.7)
Glucose, Bld: 117 mg/dL — ABNORMAL HIGH (ref 65–99)
Potassium: 4.2 mmol/L (ref 3.5–5.3)
Sodium: 137 mmol/L (ref 135–146)
Total Bilirubin: 1.3 mg/dL — ABNORMAL HIGH (ref 0.2–1.2)
Total Protein: 7.5 g/dL (ref 6.1–8.1)
eGFR: 110 mL/min/{1.73_m2} (ref >=60)

## 2023-05-16 LAB — CERULOPLASMIN: Ceruloplasmin: 28 mg/dL (ref 14–48)

## 2023-05-16 LAB — HEPATITIS PANEL, ACUTE
Hep A IgM: NONREACTIVE
Hep B C IgM: NONREACTIVE
Hepatitis B Surface Ag: NONREACTIVE
Hepatitis C Ab: NONREACTIVE

## 2023-05-16 LAB — ANTI-NUCLEAR AB-TITER (ANA TITER): ANA Titer 1: 1:40 {titer} — ABNORMAL HIGH

## 2023-05-16 LAB — ANA: Anti Nuclear Antibody (ANA): POSITIVE — AB

## 2023-05-28 ENCOUNTER — Ambulatory Visit: Payer: 59 | Admitting: Dermatology

## 2023-05-28 VITALS — BP 99/77

## 2023-05-28 DIAGNOSIS — Z79899 Other long term (current) drug therapy: Secondary | ICD-10-CM

## 2023-05-28 DIAGNOSIS — L910 Hypertrophic scar: Secondary | ICD-10-CM | POA: Diagnosis not present

## 2023-05-28 DIAGNOSIS — R208 Other disturbances of skin sensation: Secondary | ICD-10-CM

## 2023-05-28 DIAGNOSIS — Z7189 Other specified counseling: Secondary | ICD-10-CM

## 2023-05-28 MED ORDER — MOMETASONE FUROATE 0.1 % EX CREA: 1.0000 | TOPICAL_CREAM | CUTANEOUS | 2 refills | Status: AC

## 2023-05-28 NOTE — Progress Notes (Unsigned)
   New Patient Visit   Subjective  FLORNCE RECORD is a 37 y.o. female who presents for the following: check spot L post auricular, pt had bx >33yr ago by Dr. Adolphus Birchwood, bx benign, but site is still painful  New patient referral from [facility] Dr. Sterling Big.  The patient has spots, moles and lesions to be evaluated, some may be new or changing and the patient may have concern these could be cancer.   The following portions of the chart were reviewed this encounter and updated as appropriate: medications, allergies, medical history  Review of Systems:  No other skin or systemic complaints except as noted in HPI or Assessment and Plan.  Objective  Well appearing patient in no apparent distress; mood and affect are within normal limits.   A focused examination was performed of the following areas: L post auricular  Relevant exam findings are noted in the Assessment and Plan.       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 Adolphus Birchwood  Topical steroids (such as triamcinolone, fluocinolone, fluocinonide, mometasone, clobetasol, halobetasol, betamethasone, hydrocortisone) can cause thinning and lightening of the skin if they are used for too long in the same area. Your physician has selected the right strength medicine for your problem and area affected on the body. Please use your medication only as directed by your physician to prevent side effects.        Return in about 4 months (around 09/28/2023) for TBSE, f/u scar.  I, Ardis Rowan, RMA, am acting as scribe for Armida Sans, MD .   Documentation: I have reviewed the above documentation for accuracy and  completeness, and I agree with the above.  Armida Sans, MD

## 2023-05-28 NOTE — Patient Instructions (Addendum)
Start Serica scar gel or Silocone scar sheets to scar nightly Start Mometasone cr 5 days a week as needed until symptoms subside           Due to recent changes in healthcare laws, you may see results of your pathology and/or laboratory studies on MyChart before the doctors have had a chance to review them. We understand that in some cases there may be results that are confusing or concerning to you. Please understand that not all results are received at the same time and often the doctors may need to interpret multiple results in order to provide you with the best plan of care or course of treatment. Therefore, we ask that you please give [place] 2 business days to thoroughly review all your results before contacting the office for clarification. Should we see a critical lab result, you will be contacted sooner.   If You Need Anything After Your Visit  If you have any questions or concerns for your doctor, please call our main line at [phone] and press option 4 to reach your doctor's medical assistant. If no one answers, please leave a voicemail as directed and we will return your call as soon as possible. Messages left after 4 pm will be answered the following business day.   You may also send [place] a message via MyChart. We typically respond to MyChart messages within 1-2 business days.  For prescription refills, please ask your pharmacy to contact our office. Our fax number is [phone].  If you have an urgent issue when the clinic is closed that cannot wait until the next business day, you can page your doctor at the number below.    Please note that while we do our best to be available for urgent issues outside of office hours, we are not available 24/7.   If you have an urgent issue and are unable to reach [place], you may choose to seek medical care at your doctor's office, retail clinic, urgent care center, or emergency room.  If you have a medical emergency, please immediately call  911 or go to the emergency department.  Pager Numbers  - Dr. Gwen Pounds: [phone]  - Dr. Roseanne Reno: [phone]  - Dr. Katrinka Blazing: [phone]   In the event of inclement weather, please call our main line at [phone] for an update on the status of any delays or closures.  Dermatology Medication Tips: Please keep the boxes that topical medications come in in order to help keep track of the instructions about where and how to use these. Pharmacies typically print the medication instructions only on the boxes and not directly on the medication tubes.   If your medication is too expensive, please contact our office at [phone] option 4 or send [place] a message through MyChart.   We are unable to tell what your co-pay for medications will be in advance as this is different depending on your insurance coverage. However, we may be able to find a substitute medication at lower cost or fill out paperwork to get insurance to cover a needed medication.   If a prior authorization is required to get your medication covered by your insurance company, please allow [place] 1-2 business days to complete this process.  Drug prices often vary depending on where the prescription is filled and some pharmacies may offer cheaper prices.  The website www.goodrx.com contains coupons for medications through different pharmacies. The prices here do not account for what the cost may be with help from insurance (  it may be cheaper with your insurance), but the website can give you the price if you did not use any insurance.  - You can print the associated coupon and take it with your prescription to the pharmacy.  - You may also stop by our office during regular business hours and pick up a GoodRx coupon card.  - If you need your prescription sent electronically to a different pharmacy, notify our office through [facility] or by phone at [phone] option 4.     Si Usted Necesita Algo Despus de Su  Visita  Tambin puede enviarnos un mensaje a travs de Clinical cytogeneticist. Por lo general respondemos a los mensajes de MyChart en el transcurso de 1 a 2 das hbiles.  Para renovar recetas, por favor pida a su farmacia que se ponga en contacto con nuestra oficina. Annie Sable de fax es [place] [phone].  Si tiene un asunto urgente cuando la clnica est cerrada y que no puede esperar hasta el siguiente da hbil, puede llamar/localizar a su doctor(a) al nmero que aparece a continuacin.   Por favor, tenga en cuenta que aunque hacemos todo lo posible para estar disponibles para asuntos urgentes fuera del horario de [place], no estamos disponibles las 24 horas del da, los 7 [address]  [address] [place].   Si tiene un problema urgente y no puede comunicarse con nosotros, puede optar por buscar atencin mdica  en el consultorio de su doctor(a), en una clnica privada, en un centro de atencin urgente o en una sala de emergencias.  Si tiene Engineer, drilling, por favor llame inmediatamente al 911 o vaya a la sala de emergencias.  Nmeros de bper  - Dr. Gwen Pounds: [phone]  - Dra. Roseanne Reno: [phone]  - Dr. Katrinka Blazing: [phone]   En caso de inclemencias del tiempo, por favor llame a Lacy Duverney principal al [phone] para una actualizacin sobre el [place] de cualquier retraso o cierre.  Consejos para la medicacin en dermatologa: Por favor, guarde las cajas en las que vienen los medicamentos de uso tpico para ayudarle a seguir las instrucciones sobre dnde y cmo usarlos. Las farmacias generalmente imprimen las instrucciones del medicamento slo en las cajas y no directamente en los tubos del [place].   Si su medicamento es muy caro, por favor, pngase en contacto con Rolm Gala llamando al [phone] y presione la opcin 4 o envenos un mensaje a travs de Clinical cytogeneticist.   No podemos decirle cul ser su copago por los medicamentos por adelantado ya que esto es diferente dependiendo de  la cobertura de su seguro. Sin embargo, es posible que podamos encontrar un medicamento sustituto a Audiological scientist un formulario para que el seguro cubra el medicamento que se considera necesario.   Si se requiere una autorizacin previa para que su compaa de seguros [place] su medicamento, por favor permtanos de 1 a 2 das hbiles para completar [address]  Los precios de los medicamentos varan con frecuencia dependiendo del Environmental consultant de dnde se surte la receta y alguna farmacias pueden ofrecer precios ms baratos.  El sitio web www.goodrx.com tiene cupones para medicamentos de Health and safety inspector. Los precios aqu no tienen en cuenta lo que podra costar con la ayuda del seguro (puede ser ms barato con su seguro), pero el sitio web puede darle el precio si no utiliz Tourist information centre manager.  - Puede imprimir el cupn correspondiente y llevarlo con su receta a la farmacia.  - Tambin puede pasar por nuestra oficina durante el horario de  atencin regular y Education officer, museum una tarjeta de cupones de GoodRx.  - Si necesita que su receta se enve electrnicamente a una farmacia diferente, informe a nuestra oficina a travs de MyChart de  o por telfono llamando al [phone] y presione la opcin 4.

## 2023-05-29 ENCOUNTER — Encounter: Payer: Self-pay | Admitting: Dermatology

## 2023-05-29 ENCOUNTER — Encounter: Payer: Self-pay | Admitting: Internal Medicine

## 2023-07-30 ENCOUNTER — Telehealth: Payer: Self-pay | Admitting: *Deleted

## 2023-07-30 NOTE — Telephone Encounter (Signed)
Left message for patient to let her know that her paperwork is up front ready to be picked up

## 2023-08-13 NOTE — Progress Notes (Unsigned)
   Established Patient Office Visit  Subjective   Patient ID: Lacey Harrison, female    DOB: Sep 16, 1985  Age: 37 y.o. MRN: [id number]  No chief complaint on file.   HPI  Patient is here for follow up on chronic medical conditions.   MDD: -Mood status: better -Current treatment: had been on Wellbutrin 150 mg XL but discontinued due to side effects ? -Satisfied with current treatment?: yes -Duration of current treatment : 2 weeks -Side effects: no Medication compliance: excellent compliance Psychotherapy/counseling: yes current    05/14/2023    8:02 AM 04/04/2023    8:48 AM 04/04/2023    8:45 AM 05/13/2022    2:32 PM 04/19/2022    9:16 AM  Depression screen PHQ 2/9  Decreased Interest 0 3 3 3  0  Down, Depressed, Hopeless 0 3 3 1  0  PHQ - 2 Score 0 6 6 4  0  Altered sleeping 0 3  3 0  Tired, decreased energy 0 3  3 0  Change in appetite 0 3  1 0  Feeling bad or failure about yourself  0 1  0 0  Trouble concentrating 0 2  3 0  Moving slowly or fidgety/restless 0 0  0 0  Suicidal thoughts 0 0  0 0  PHQ-9 Score 0 18  14 0  Difficult doing work/chores Not difficult at all Very difficult  Extremely dIfficult Not difficult at all     NASH: -History of elevated LFT's - last checked 9/24 AST 53, ALT 75 -Hepatic steatosis seen on abdominal CT scan in July   HLD: -Medications: Nothing -Strong family history  -Last lipid panel: Lipid Panel     Component Value Date/Time   CHOL 258 (H) 04/04/2023 0940   CHOL 253 (H) 11/22/2020 1119   TRIG 356 (H) 04/04/2023 0940   HDL 41 (L) 04/04/2023 0940   HDL 40 11/22/2020 1119   CHOLHDL 6.3 (H) 04/04/2023 0940   VLDL 38.0 05/18/2021 0749   LDLCALC 162 (H) 04/04/2023 0940   LDLDIRECT 140.0 01/30/2021 1108   LABVLDL 60 (H) 11/22/2020 1119    Breast Pain: -Pain to palpation under left breast  -No cysts/masses palpated   UTI symptoms: -Had been on vacation a few weeks ago, was having some dysuria and increased urinary urgency. Took Azo  which helped but then when she stopped symptoms came back  {History (Optional):23778}  ROS    Objective:     There were no vitals taken for this visit. {Vitals History (Optional):23777}  Physical Exam   No results found for any visits on 08/14/23.  {Labs (Optional):23779}  The ASCVD Risk score (Arnett DK, et al., 2019) failed to calculate for the following reasons:   The 2019 ASCVD risk score is only valid for ages 61 to 86    Assessment & Plan:  There are no diagnoses linked to this encounter.   No follow-ups on file.    Margarita Mail, DO

## 2023-08-14 ENCOUNTER — Encounter: Payer: Self-pay | Admitting: Internal Medicine

## 2023-08-14 ENCOUNTER — Ambulatory Visit (INDEPENDENT_AMBULATORY_CARE_PROVIDER_SITE_OTHER): Payer: 59 | Admitting: Internal Medicine

## 2023-08-14 ENCOUNTER — Other Ambulatory Visit: Payer: Self-pay

## 2023-08-14 VITALS — BP 122/80 | HR 98 | Temp 98.2°F | Resp 16 | Ht 63.0 in | Wt 200.3 lb

## 2023-08-14 DIAGNOSIS — R7989 Other specified abnormal findings of blood chemistry: Secondary | ICD-10-CM | POA: Diagnosis not present

## 2023-08-14 DIAGNOSIS — Z23 Encounter for immunization: Secondary | ICD-10-CM | POA: Diagnosis not present

## 2023-08-14 DIAGNOSIS — R768 Other specified abnormal immunological findings in serum: Secondary | ICD-10-CM | POA: Diagnosis not present

## 2023-08-14 DIAGNOSIS — F419 Anxiety disorder, unspecified: Secondary | ICD-10-CM | POA: Diagnosis not present

## 2023-08-14 MED ORDER — BUSPIRONE HCL 5 MG PO TABS: 5.0000 mg | ORAL_TABLET | Freq: Two times a day (BID) | ORAL | 1 refills | Status: AC | PRN

## 2023-08-14 NOTE — Assessment & Plan Note (Signed)
Work up negative other than borderline ANA. Patient has a family history of lupus and joint pains, wanting referral to Rheumatology. Referral placed but denied. Otherwise work up negative and consistent with NASH. Briefly discussed GLP-1's, patient wants to work on lifestyle first.

## 2023-08-14 NOTE — Assessment & Plan Note (Addendum)
Discontinue Wellbutrin due to side effects, start Buspar 5 mg BID PRN. Doing well with therapy.

## 2023-08-14 NOTE — Progress Notes (Signed)
Established Patient Office Visit  Subjective   Patient ID: Lacey Harrison, female    DOB: [birth year]  Age: 37 y.o. MRN: [id number]  Chief Complaint  Patient presents with   Medical Management of Chronic Issues    3 month follow up    HPI  Patient is here for follow up on chronic medical conditions.   MDD: -Mood status: stable -Current treatment: had been on Wellbutrin 150 mg XL but discontinued due to side effects - was having nocturia and urinary symptoms which stopped right after she stopped the medication. Participating in therapy and feeling good currently in terms of depression but wanting to discuss something for anxiety -Satisfied with current treatment?: no Psychotherapy/counseling: yes current    08/14/2023    9:44 AM 05/14/2023    8:02 AM 04/04/2023    8:48 AM 04/04/2023    8:45 AM 05/13/2022    2:32 PM  Depression screen PHQ 2/9  Decreased Interest 1 0 3 3 3   Down, Depressed, Hopeless 1 0 3 3 1   PHQ - 2 Score 2 0 6 6 4   Altered sleeping 3 0 3  3  Tired, decreased energy 3 0 3  3  Change in appetite 1 0 3  1  Feeling bad or failure about yourself  0 0 1  0  Trouble concentrating 3 0 2  3  Moving slowly or fidgety/restless 0 0 0  0  Suicidal thoughts 0 0 0  0  PHQ-9 Score 12 0 18  14  Difficult doing work/chores Somewhat difficult Not difficult at all Very difficult  Extremely dIfficult   NASH: -History of elevated LFT's - last checked 9/24 AST 53, ALT 75 -Hepatic steatosis seen on abdominal CT scan in July  -ANA 1:40 positive, patient with family history of Lupus  HLD: -Medications: Nothing -Strong family history  -Last lipid panel: Lipid Panel     Component Value Date/Time   CHOL 258 (H) 04/04/2023 0940   CHOL 253 (H) 11/22/2020 1119   TRIG 356 (H) 04/04/2023 0940   HDL 41 (L) 04/04/2023 0940   HDL 40 11/22/2020 1119   CHOLHDL 6.3 (H) 04/04/2023 0940   VLDL 38.0 05/18/2021 0749   LDLCALC 162 (H) 04/04/2023 0940   LDLDIRECT 140.0 01/30/2021 1108    LABVLDL 60 (H) 11/22/2020 1119    Patient Active Problem List   Diagnosis Date Noted   Anxiety 08/14/2023   Elevated LFTs 08/14/2023   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   S/P colostomy takedown 10/15/2022   Endometritis 08/12/2022   Neck mass 07/25/2022   Hypertriglyceridemia 04/19/2022   Pelvic abscess in female    Abdominal pain 03/26/2022   Acute respiratory failure with hypoxia (HCC) in seting of MSKpain, atelectasis, severe sepsis  03/25/2022   Severe sepsis (HCC) without septic shock, d/t postoperative abscess, s/p pelvic drain placement  03/25/2022   Ileus, postoperative (HCC) 03/25/2022   AKI (acute kidney injury) (HCC) in setting of sepsis, IV contrast, NAGMA - RESOLVED  03/25/2022   Postoperative intra-abdominal abscess (HCC) 03/21/2022   Hydrosalpinx    Infertility, female    Pelvic adhesive disease    History of ovarian cyst 11/21/2020   Menstrual irregularity 11/21/2020   Vitamin D deficiency 11/21/2020   Screening for blood or protein in urine 11/21/2020   Fatigue 11/21/2020   Swollen lymph nodes 11/21/2020   Otitis externa 11/21/2020   Seasonal allergies 11/21/2020   Tobacco use 07/12/2019   Lyme disease 02/02/2019   Migraines 02/02/2019  Blepharitis 12/25/2018   Depression 03/07/2016   Past Medical History:  Diagnosis Date   Acute respiratory failure (HCC) 03/2022   AKI (acute kidney injury) (HCC)    in setting of sepsi, IV contrast, NAGMA-RESOLVED   Allergy    Anxiety    Chicken pox    Colostomy in place [facility])    Depression    Heart murmur    Ileus (HCC)    Lyme disease    patient reports a remote history of lyme and alludes that she has "chronic lyme"   Migraines    MIGRAINES   Neck mass    Ovarian cyst    Pneumonia 07/2017   Sepsis (HCC) 03/2022   Tobacco use    Past Surgical History:  Procedure Laterality Date   APPENDECTOMY  10/15/2022   Procedure: APPENDECTOMY;  Surgeon: Leafy Ro, MD;  Location: [facility] ORS;  Service: General;;    CHROMOPERTUBATION  02/16/2018   Procedure: CHROMOPERTUBATION;  Surgeon: Suzy Bouchard, MD;  Location: [facility];  Service: Gynecology;;   CHROMOPERTUBATION Left 03/18/2022   Procedure: CHROMOPERTUBATION;  Surgeon: Hildred Laser, MD;  Location: [facility];  Service: Gynecology;  Laterality: Left;   COLECTOMY WITH COLOSTOMY CREATION/HARTMANN PROCEDURE N/A 03/27/2022   Procedure: SIGMOID COLECTOMY WITH COLOSTOMY CREATION/HARTMANN PROCEDURE; LYSIS OF ADHESIONS;  Surgeon: Leafy Ro, MD;  Location: [facility] OR[facility];  Service: General;  Laterality: N/A;   COLOSTOMY REVERSAL N/A 10/15/2022   Procedure: COLOSTOMY REVERSAL-RNFA to assist;  Surgeon: Leafy Ro, MD;  Location: [facility] OR[facility];  Service: General;  Laterality: N/A;   CYSTECTOMY Left    OVARY   DILATION AND CURETTAGE OF UTERUS  2008   EXCISION MASS NECK N/A 07/25/2022   Procedure: EXCISION MASS NECK, posterior;  Surgeon: Leafy Ro, MD;  Location: [facility];  Service: General;  Laterality: N/A;   LAPAROSCOPIC LYSIS OF ADHESIONS N/A 02/16/2018   Procedure: EXTENSIVE LAPAROSCOPIC LYSIS OF ADHESIONS;  Surgeon: Suzy Bouchard, MD;  Location: [facility];  Service: Gynecology;  Laterality: N/A;   LAPAROSCOPY N/A 02/16/2018   Procedure: LAPAROSCOPY OPERATIVE;  Surgeon: Schermerhorn, Ihor Austin, MD;  Location: [facility];  Service: Gynecology;  Laterality: N/A;   LAPAROTOMY N/A 03/27/2022   Procedure: EXPLORATORY LAPAROTOMY;  Surgeon: Leafy Ro, MD;  Location: [facility];  Service: General;  Laterality: N/A;   LYSIS OF ADHESION N/A 03/18/2022   Procedure: LYSIS OF ADHESION;  Surgeon: Hildred Laser, MD;  Location: [facility];  Service: Gynecology;  Laterality: N/A;   XI ROBOTIC ASSISTED SALPINGECTOMY Left 03/18/2022   Procedure: XI ROBOTIC ASSISTED SALPINGECTOMY;  Surgeon: Hildred Laser, MD;  Location: [facility];  Service: Gynecology;  Laterality: Left;   Social History   Tobacco Use   Smoking status: Some Days    Current packs/day: 0.00     Average packs/day: 0.8 packs/day for 14.0 years (10.5 ttl pk-yrs)    Types: Cigarettes    Start date: 07/05/2002    Last attempt to quit: 07/05/2016    Years since quitting: 7.1    Passive exposure: Past   Smokeless tobacco: Never  Vaping Use   Vaping status: Former  Substance Use Topics   Alcohol use: Not Currently    Comment: previous heavy drinke wine on occasion now 10/07/22   Drug use: No   Social History   Socioeconomic History   Marital status: Married    Spouse name: Alycia Rossetti   Number of children: Not on file   Years of education: Not on file  Highest education level: Bachelor's degree (e.g., BA, AB, BS)  Occupational History   Not on file  Tobacco Use   Smoking status: Some Days    Current packs/day: 0.00    Average packs/day: 0.8 packs/day for 14.0 years (10.5 ttl pk-yrs)    Types: Cigarettes    Start date: 07/05/2002    Last attempt to quit: 07/05/2016    Years since quitting: 7.1    Passive exposure: Past   Smokeless tobacco: Never  Vaping Use   Vaping status: Former  Substance and Sexual Activity   Alcohol use: Not Currently    Comment: previous heavy drinke wine on occasion now 10/07/22   Drug use: No   Sexual activity: Yes    Birth control/protection: None  Other Topics Concern   Not on file  Social History Narrative   Not on file   Social Drivers of Health   Financial Resource Strain: Low Risk  (05/11/2023)   Overall Financial Resource Strain (CARDIA)    Difficulty of Paying Living Expenses: Not hard at all  Food Insecurity: No Food Insecurity (05/11/2023)   Hunger Vital Sign    Worried About Running Out of Food in the Last Year: Never true    Ran Out of Food in the Last Year: Never true  Transportation Needs: No Transportation Needs (05/11/2023)   PRAPARE - Administrator, Civil Service (Medical): No    Lack of Transportation (Non-Medical): No  Physical Activity: Insufficiently Active (05/11/2023)   Exercise Vital Sign    Days of  Exercise per Week: 2 days    Minutes of Exercise per Session: 20 min  Stress: Stress Concern Present (05/11/2023)   [employer] of Occupational Health - Occupational Stress Questionnaire    Feeling of Stress : To some extent  Social Connections: Moderately Isolated (05/11/2023)   Social Connection and Isolation Panel [NHANES]    Frequency of Communication with Friends and Family: More than three times a week    Frequency of Social Gatherings with Friends and Family: Twice a week    Attends Religious Services: Never    Database administrator or Organizations: No    Attends Engineer, structural: Not on file    Marital Status: Married  Catering manager Violence: Not At Risk (10/15/2022)   Humiliation, Afraid, Rape, and Kick questionnaire    Fear of Current or Ex-Partner: No    Emotionally Abused: No    Physically Abused: No    Sexually Abused: No   Family Status  Relation Name Status   Mother  Alive   Father  Deceased   MGM  Alive   PGM  Deceased   MGF  Deceased   PGF  Deceased  No partnership data on file   Family History  Problem Relation Age of Onset   Mental illness Mother    Mental illness Father    Mental illness Maternal Grandmother    Mental illness Paternal Grandmother    No Known Allergies    Review of Systems  All other systems reviewed and are negative.     Objective:     BP 122/80   Pulse 98   Temp 98.2 F (36.8 C) (Oral)   Resp 16   Ht 5\' 3"  (1.6 m)   Wt 200 lb 4.8 oz (90.9 kg)   SpO2 99%   BMI 35.48 kg/m  BP Readings from Last 3 Encounters:  08/14/23 122/80  05/28/23 99/77  05/14/23 110/76   Wt Readings  from Last 3 Encounters:  08/14/23 200 lb 4.8 oz (90.9 kg)  05/14/23 199 lb (90.3 kg)  04/04/23 200 lb 4.8 oz (90.9 kg)      Physical Exam Constitutional:      Appearance: Normal appearance.  HENT:     Head: Normocephalic and atraumatic.  Eyes:     Conjunctiva/sclera: Conjunctivae normal.  Cardiovascular:     Rate and  Rhythm: Normal rate and regular rhythm.  Pulmonary:     Effort: Pulmonary effort is normal.     Breath sounds: Normal breath sounds.  Skin:    General: Skin is warm and dry.  Neurological:     General: No focal deficit present.     Mental Status: She is alert. Mental status is at baseline.  Psychiatric:        Mood and Affect: Mood normal.        Behavior: Behavior normal.      No results found for any visits on 08/14/23.  Last CBC Lab Results  Component Value Date   WBC 11.1 (H) 03/04/2023   HGB 14.6 03/04/2023   HCT 42.3 03/04/2023   MCV 81.8 03/04/2023   MCH 28.2 03/04/2023   RDW 12.8 03/04/2023   PLT 290 03/04/2023   Last metabolic panel Lab Results  Component Value Date   GLUCOSE 117 (H) 05/14/2023   NA 137 05/14/2023   K 4.2 05/14/2023   CL 101 05/14/2023   CO2 25 05/14/2023   BUN 11 05/14/2023   CREATININE 0.72 05/14/2023   EGFR 110 05/14/2023   CALCIUM 9.6 05/14/2023   PHOS 2.7 10/17/2022   PROT 7.5 05/14/2023   ALBUMIN 4.5 07/12/2022   LABGLOB 2.3 11/22/2020   AGRATIO 2.0 11/22/2020   BILITOT 1.3 (H) 05/14/2023   ALKPHOS 65 07/12/2022   AST 53 (H) 05/14/2023   ALT 75 (H) 05/14/2023   ANIONGAP 10 03/04/2023   Last lipids Lab Results  Component Value Date   CHOL 258 (H) 04/04/2023   HDL 41 (L) 04/04/2023   LDLCALC 162 (H) 04/04/2023   LDLDIRECT 140.0 01/30/2021   TRIG 356 (H) 04/04/2023   CHOLHDL 6.3 (H) 04/04/2023   Last hemoglobin A1c Lab Results  Component Value Date   HGBA1C 5.6 04/04/2023   Last thyroid functions Lab Results  Component Value Date   TSH 2.20 04/04/2023   Last vitamin D Lab Results  Component Value Date   VD25OH 24 (L) 04/04/2023   Last vitamin B12 and Folate No results found for: "VITAMINB12", "FOLATE"    The ASCVD Risk score (Arnett DK, et al., 2019) failed to calculate for the following reasons:   The 2019 ASCVD risk score is only valid for ages 33 to 26    Assessment & Plan:  Anxiety Assessment &  Plan: Discontinue Wellbutrin due to side effects, start Buspar 5 mg BID PRN. Doing well with therapy.   Orders: -     busPIRone HCl; Take 1 tablet (5 mg total) by mouth 2 (two) times daily as needed.  Dispense: 120 tablet; Refill: 1  Elevated LFTs Assessment & Plan: Work up negative other than borderline ANA. Patient has a family history of lupus and joint pains, wanting referral to Rheumatology. Referral placed but denied. Otherwise work up negative and consistent with NASH. Briefly discussed GLP-1's, patient wants to work on lifestyle first.   Orders: -     Ambulatory referral to Rheumatology  Positive ANA (antinuclear antibody) -     Ambulatory referral to Rheumatology  Need  for influenza vaccination -     Flu vaccine trivalent PF, 6mos and older(Flulaval,Afluria,Fluarix,Fluzone)     Return in about 6 months (around 02/12/2024).    Margarita Mail, DO

## 2023-10-05 ENCOUNTER — Other Ambulatory Visit: Payer: Self-pay | Admitting: Internal Medicine

## 2023-10-05 DIAGNOSIS — F419 Anxiety disorder, unspecified: Secondary | ICD-10-CM

## 2023-10-07 NOTE — Telephone Encounter (Signed)
Requested Prescriptions  Pending Prescriptions Disp Refills   busPIRone (BUSPAR) 5 MG tablet [Pharmacy Med Name: BUSPIRONE HCL 5 MG TABLET] 180 tablet 2    Sig: TAKE 1 TABLET BY MOUTH 2 TIMES DAILY AS NEEDED.     Psychiatry: Anxiolytics/Hypnotics - Non-controlled Passed - 10/07/2023  7:39 AM      Passed - Valid encounter within last 12 months    Recent Outpatient Visits           1 month ago Anxiety   [facility] Margarita Mail, DO   4 months ago Episode of recurrent major depressive disorder, unspecified depression episode severity [facility])   [facility] Margarita Mail, DO   6 months ago Annual physical exam   [facility] Margarita Mail, DO   1 year ago Lipoma of neck   [facility] Margarita Mail, DO   1 year ago Hospital discharge follow-up   [facility] Margarita Mail, DO       Future Appointments             In 1 week Deirdre Evener, MD [facility]   In 4 months Margarita Mail, DO [facility], [facility]

## 2023-10-16 ENCOUNTER — Ambulatory Visit: Payer: 59 | Admitting: Dermatology

## 2024-02-12 ENCOUNTER — Ambulatory Visit (INDEPENDENT_AMBULATORY_CARE_PROVIDER_SITE_OTHER): Payer: Self-pay | Admitting: Internal Medicine

## 2024-02-12 ENCOUNTER — Other Ambulatory Visit: Payer: Self-pay

## 2024-02-12 ENCOUNTER — Encounter: Payer: Self-pay | Admitting: Internal Medicine

## 2024-02-12 VITALS — BP 120/72 | HR 88 | Temp 98.0°F | Resp 16 | Ht 63.0 in | Wt 201.7 lb

## 2024-02-12 DIAGNOSIS — M2550 Pain in unspecified joint: Secondary | ICD-10-CM

## 2024-02-12 DIAGNOSIS — R635 Abnormal weight gain: Secondary | ICD-10-CM

## 2024-02-12 DIAGNOSIS — C866 Primary cutaneous CD30-positive T-cell proliferations not having achieved remission: Secondary | ICD-10-CM

## 2024-02-12 DIAGNOSIS — R7989 Other specified abnormal findings of blood chemistry: Secondary | ICD-10-CM

## 2024-02-12 DIAGNOSIS — N911 Secondary amenorrhea: Secondary | ICD-10-CM | POA: Diagnosis not present

## 2024-02-12 NOTE — Progress Notes (Signed)
 Established Patient Office Visit  Subjective   Patient ID: Lacey Harrison, female    DOB: [birth year]  Age: 38 y.o. MRN: [id number]  Chief Complaint  Patient presents with   Medical Management of Chronic Issues    6 month recheck    HPI  Patient is here for follow up on chronic medical conditions.   Discussed the use of AI scribe software for clinical note transcription with the patient, who gave verbal consent to proceed.  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  She manages lymphomatoid papulosis with methasone cream and has not required radiation therapy. A biopsy in May and a recent oncologist follow-up were positive.  She experiences joint pain and has been prescribed Cymbalta but has not started it due to personal reservations. She is considering lifestyle changes for symptom management.  She reports amenorrhea for five to six months, associated with significant weight gain to 200 pounds. She experiences bloating and discomfort, which she attributes to her weight. Constipation is managed with MiraLAX , taken almost daily.  She manages stress through therapy, which she finds beneficial despite some dissatisfaction with her therapist. 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   MDD: -Mood status: stable -Current treatment: had been on Wellbutrin  150 mg XL but discontinued due to side effects - was having nocturia and urinary symptoms which stopped right after she stopped the medication. Participating in therapy and feeling good currently in terms of depression. Has rx for Buspar  PRN - has rarely had to use -Satisfied with current treatment?: yes Psychotherapy/counseling: yes current     02/12/2024    8:27 AM 08/14/2023    9:44 AM 05/14/2023    8:02 AM 04/04/2023    8:48 AM 04/04/2023    8:45 AM   Depression screen PHQ 2/9  Decreased Interest 0 1 0 3 3  Down, Depressed, Hopeless 0 1 0 3 3  PHQ - 2 Score 0 2 0 6 6  Altered sleeping 0 3 0 3   Tired, decreased energy 1 3 0 3   Change in appetite 0 1 0 3   Feeling bad or failure about yourself  0 0 0 1   Trouble concentrating 0 3 0 2   Moving slowly or fidgety/restless 0 0 0 0   Suicidal thoughts 0 0 0 0   PHQ-9 Score 1 12 0 18   Difficult doing work/chores Not difficult at all Somewhat difficult Not difficult at all Very difficult     NASH: -History of elevated LFT's - last checked 2/25 ALT 51, AST 33 -Hepatic steatosis seen on abdominal CT scan in July 2024 -ANA 1:40 positive, patient with family history of Lupus  HLD: -Medications: Nothing -Strong family history  -Last lipid panel: Lipid Panel     Component Value Date/Time   CHOL 258 (H) 04/04/2023 0940   CHOL 253 (H) 11/22/2020 1119   TRIG 356 (H) 04/04/2023 0940   HDL 41 (L) 04/04/2023 0940   HDL 40 11/22/2020 1119   CHOLHDL 6.3 (H) 04/04/2023 0940   VLDL 38.0 05/18/2021 0749   LDLCALC 162 (H) 04/04/2023 0940   LDLDIRECT 140.0 01/30/2021 1108   LABVLDL 60 (H) 11/22/2020 1119      Patient Active Problem List   Diagnosis Date Noted   Anxiety 08/14/2023   Elevated LFTs 08/14/2023   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   S/P colostomy takedown 10/15/2022   Endometritis 08/12/2022   Neck mass 07/25/2022   Hypertriglyceridemia 04/19/2022   Pelvic abscess in female    Abdominal pain 03/26/2022   Acute respiratory failure with hypoxia (HCC) in seting of MSKpain, atelectasis, severe sepsis  03/25/2022   Severe sepsis (HCC) without septic shock, d/t postoperative abscess, s/p pelvic drain placement  03/25/2022   Ileus, postoperative (HCC) 03/25/2022   AKI (acute kidney injury) (HCC) in setting of sepsis, IV contrast, NAGMA - RESOLVED  03/25/2022   Postoperative intra-abdominal abscess (HCC) 03/21/2022   Hydrosalpinx    Infertility, female    Pelvic adhesive  disease    History of ovarian cyst 11/21/2020   Menstrual irregularity 11/21/2020   Vitamin D  deficiency 11/21/2020   Screening for blood or protein in urine 11/21/2020   Fatigue 11/21/2020   Swollen lymph nodes 11/21/2020   Otitis externa 11/21/2020   Seasonal allergies 11/21/2020   Tobacco use 07/12/2019   Lyme disease 02/02/2019   Migraines 02/02/2019   Blepharitis 12/25/2018   Depression 03/07/2016   Past Medical History:  Diagnosis Date   Acute respiratory failure (HCC) 03/2022   AKI (acute kidney injury) (HCC)    in setting of sepsi, IV contrast, NAGMA-RESOLVED   Allergy    Anxiety    Chicken pox    Colostomy in place [facility])    Depression    Heart murmur    Ileus (HCC)    Lyme disease    patient reports a remote history of lyme and alludes that she has chronic lyme   Migraines    MIGRAINES   Neck mass    Ovarian cyst    Pneumonia 07/2017   Sepsis (HCC) 03/2022   Tobacco use    Past Surgical History:  Procedure Laterality Date   APPENDECTOMY  10/15/2022   Procedure: APPENDECTOMY;  Surgeon: Alben Alma, MD;  Location: [facility] ORS;  Service: General;;   CHROMOPERTUBATION  02/16/2018   Procedure: CHROMOPERTUBATION;  Surgeon: Carolynn Citrin, MD;  Location: [facility] ORS;  Service: Gynecology;;   CHROMOPERTUBATION Left 03/18/2022   Procedure: CHROMOPERTUBATION;  Surgeon: Teresa Fender, MD;  Location: [facility] ORS;  Service: Gynecology;  Laterality: Left;   COLECTOMY WITH COLOSTOMY CREATION/HARTMANN PROCEDURE N/A 03/27/2022   Procedure: SIGMOID COLECTOMY WITH COLOSTOMY CREATION/HARTMANN PROCEDURE; LYSIS OF ADHESIONS;  Surgeon: Alben Alma, MD;  Location: [facility] ORS;  Service: General;  Laterality: N/A;   COLOSTOMY REVERSAL N/A 10/15/2022   Procedure: COLOSTOMY REVERSAL-RNFA to assist;  Surgeon: Alben Alma, MD;  Location: [facility] ORS;  Service: General;  Laterality: N/A;   CYSTECTOMY Left    OVARY   DILATION AND CURETTAGE OF UTERUS  2008   EXCISION MASS NECK N/A  07/25/2022   Procedure: EXCISION MASS NECK, posterior;  Surgeon: Alben Alma, MD;  Location: [facility] ORS;  Service: General;  Laterality: N/A;   LAPAROSCOPIC LYSIS OF ADHESIONS N/A 02/16/2018   Procedure: EXTENSIVE LAPAROSCOPIC LYSIS OF ADHESIONS;  Surgeon: Carolynn Citrin, MD;  Location: [facility] OR[facility];  Service: Gynecology;  Laterality: N/A;   LAPAROSCOPY N/A 02/16/2018   Procedure: LAPAROSCOPY OPERATIVE;  Surgeon: Schermerhorn, Joselyn Nicely, MD;  Location: [facility] OR[facility];  Service: Gynecology;  Laterality: N/A;   LAPAROTOMY N/A 03/27/2022   Procedure: EXPLORATORY LAPAROTOMY;  Surgeon: Alben Alma, MD;  Location: [facility] ORS;  Service: General;  Laterality: N/A;   LYSIS OF ADHESION N/A 03/18/2022   Procedure: LYSIS OF ADHESION;  Surgeon: Teresa Fender, MD;  [facility]  ORS;  Service: Gynecology;  Laterality: N/A;   XI ROBOTIC ASSISTED SALPINGECTOMY Left 03/18/2022   Procedure: XI ROBOTIC ASSISTED SALPINGECTOMY;  Surgeon: Teresa Fender, MD;  Location: [facility] ORS;  Service: Gynecology;  Laterality: Left;   Social History   Tobacco Use   Smoking status: Some Days    Current packs/day: 0.00    Average packs/day: 0.8 packs/day for 14.0 years (10.5 ttl pk-yrs)    Types: Cigarettes    Start date: 07/05/2002    Last attempt to quit: 07/05/2016    Years since quitting: 7.6    Passive exposure: Past   Smokeless tobacco: Never  Vaping Use   Vaping status: Former  Substance Use Topics   Alcohol use: Not Currently    Comment: previous heavy drinke wine on occasion now 10/07/22   Drug use: No   Social History   Socioeconomic History   Marital status: Married    Spouse name: Verdie Gladden   Number of children: Not on file   Years of education: Not on file   Highest education level: Bachelor's degree (e.g., BA, AB, BS)  Occupational History   Not on file  Tobacco Use   Smoking status: Some Days    Current packs/day: 0.00    Average packs/day: 0.8 packs/day for 14.0 years (10.5 ttl pk-yrs)    Types:  Cigarettes    Start date: 07/05/2002    Last attempt to quit: 07/05/2016    Years since quitting: 7.6    Passive exposure: Past   Smokeless tobacco: Never  Vaping Use   Vaping status: Former  Substance and Sexual Activity   Alcohol use: Not Currently    Comment: previous heavy drinke wine on occasion now 10/07/22   Drug use: No   Sexual activity: Yes    Birth control/protection: None  Other Topics Concern   Not on file  Social History Narrative   Not on file   Social Drivers of Health   Financial Resource Strain: Low Risk  (10/22/2023)   [facility]   Overall Financial Resource Strain (CARDIA)    Difficulty of Paying Living Expenses: Not hard at all  Food Insecurity: No Food Insecurity (10/22/2023)   Received from [facility]   Hunger Vital Sign    Within the past 12 months, you worried that your food would run out before you got the money to buy more.: Never true    Within the past 12 months, the food you bought just didn't last and you didn't have money to get more.: Never true  Transportation Needs: No Transportation Needs (10/22/2023)   Received from [facility] - Transportation    In the past 12 months, has lack of transportation kept you from medical appointments or from getting medications?: No    Lack of Transportation (Non-Medical): No  Physical Activity: Insufficiently Active (05/11/2023)   Exercise Vital Sign    Days of Exercise per Week: 2 days    Minutes of Exercise per Session: 20 min  Stress: Stress Concern Present (05/11/2023)   [employer] - Occupational Stress Questionnaire    Feeling of Stress : To some extent  Social Connections: Moderately Isolated (05/11/2023)   Social Connection and Isolation Panel    Frequency of Communication with Friends and Family: More than three times a week    Frequency of Social Gatherings with Friends and Family: Twice a week     Attends Religious Services: Never  Active Member of Clubs or Organizations: No    Attends Banker Meetings: Not on file    Marital Status: Married  Intimate Partner Violence: Not At Risk (10/15/2022)   Humiliation, Afraid, Rape, and Kick questionnaire    Fear of Current or Ex-Partner: No    Emotionally Abused: No    Physically Abused: No    Sexually Abused: No   Family Status  Relation Name Status   Mother  Alive   Father  Deceased   MGM  Alive   PGM  Deceased   MGF  Deceased   PGF  Deceased  No partnership data on file   Family History  Problem Relation Age of Onset   Mental illness Mother    Mental illness Father    Mental illness Maternal Grandmother    Mental illness Paternal Grandmother    No Known Allergies    Review of Systems  All other systems reviewed and are negative.     Objective:     BP 120/72 (Cuff Size: Large)   Pulse 88   Temp 98 F (36.7 C) (Oral)   Resp 16   Ht 5' 3 (1.6 m)   Wt 201 lb 11.2 oz (91.5 kg)   SpO2 97%   BMI 35.73 kg/m  BP Readings from Last 3 Encounters:  02/12/24 120/72  08/14/23 122/80  05/28/23 99/77   Wt Readings from Last 3 Encounters:  02/12/24 201 lb 11.2 oz (91.5 kg)  08/14/23 200 lb 4.8 oz (90.9 kg)  05/14/23 199 lb (90.3 kg)      Physical Exam Constitutional:      Appearance: Normal appearance.  HENT:     Head: Normocephalic and atraumatic.   Eyes:     Conjunctiva/sclera: Conjunctivae normal.    Cardiovascular:     Rate and Rhythm: Normal rate and regular rhythm.  Pulmonary:     Effort: Pulmonary effort is normal.     Breath sounds: Normal breath sounds.   Skin:    General: Skin is warm and dry.   Neurological:     General: No focal deficit present.     Mental Status: She is alert. Mental status is at baseline.   Psychiatric:        Mood and Affect: Mood normal.        Behavior: Behavior normal.      No results found for any visits on 02/12/24.  Last CBC Lab  Results  Component Value Date   WBC 11.1 (H) 03/04/2023   HGB 14.6 03/04/2023   HCT 42.3 03/04/2023   MCV 81.8 03/04/2023   MCH 28.2 03/04/2023   RDW 12.8 03/04/2023   PLT 290 03/04/2023   Last metabolic panel Lab Results  Component Value Date   GLUCOSE 117 (H) 05/14/2023   NA 137 05/14/2023   K 4.2 05/14/2023   CL 101 05/14/2023   CO2 25 05/14/2023   BUN 11 05/14/2023   CREATININE 0.72 05/14/2023   EGFR 110 05/14/2023   CALCIUM 9.6 05/14/2023   PHOS 2.7 10/17/2022   PROT 7.5 05/14/2023   ALBUMIN  4.5 07/12/2022   LABGLOB 2.3 11/22/2020   AGRATIO 2.0 11/22/2020   BILITOT 1.3 (H) 05/14/2023   ALKPHOS 65 07/12/2022   AST 53 (H) 05/14/2023   ALT 75 (H) 05/14/2023   ANIONGAP 10 03/04/2023   Last lipids Lab Results  Component Value Date   CHOL 258 (H) 04/04/2023   HDL 41 (L) 04/04/2023   LDLCALC 162 (H) 04/04/2023  LDLDIRECT 140.0 01/30/2021   TRIG 356 (H) 04/04/2023   CHOLHDL 6.3 (H) 04/04/2023   Last hemoglobin A1c Lab Results  Component Value Date   HGBA1C 5.6 04/04/2023   Last thyroid  functions Lab Results  Component Value Date   TSH 2.20 04/04/2023   Last vitamin D  Lab Results  Component Value Date   VD25OH 24 (L) 04/04/2023   Last vitamin B12 and Folate No results found for: VITAMINB12, FOLATE    The ASCVD Risk score (Arnett DK, et al., 2019) failed to calculate for the following reasons:   The 2019 ASCVD risk score is only valid for ages 36 to 40    Assessment & Plan:   Assessment & Plan   Joint Pain Chronic joint pain evaluated by rheumatology. Duloxetine considered for neuropathic pain but not initiated due to preference for non-pharmacological options. Lifestyle changes and stress management therapy considered. - Consider duloxetine if pain unmanageable. - Continue stress management therapy. - Reassess treatment options based on joint pain.  Endometriosis and Amenorrhea Endometriosis with amenorrhea potentially related to weight  gain and stress. GLP-1 agonists considered for weight management and potential impact on menstrual cycles. - Order labs for thyroid  function, hormone levels, lactate. - Discuss GLP-1 agonists for weight management in September. - Monitor menstrual cycle and reassess if necessary.  Obesity Current weight 200 pounds. Previous weight loss with keto diet and exercise. Interested in GLP-1 agonists for weight loss. History of postoperative ileus, managing constipation with polyethylene glycol. - Discuss GLP-1 agonists for weight management in September. - Continue polyethylene glycol for constipation. - Increase fiber intake and hydration. - Monitor weight and adjust treatment as necessary.  Elevated Liver Enzymes Previous elevated ALT at 51, AST normal. No medications affecting liver enzymes. Ongoing monitoring. - Recheck liver enzymes.  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 Managed with topical methasone cream. - Continue methasone cream for nodules. - Monitor for changes in condition.  - TSH - HgB A1c - FSH/LH - Prolactin - Hepatic function panel  Return in about 6 months (around 08/13/2024).    Rockney Cid, DO

## 2024-02-13 ENCOUNTER — Ambulatory Visit: Payer: Self-pay | Admitting: Internal Medicine

## 2024-02-13 LAB — HEMOGLOBIN A1C
Hgb A1c MFr Bld: 6 % — ABNORMAL HIGH (ref <5.7)
Mean Plasma Glucose: 126 mg/dL
eAG (mmol/L): 7 mmol/L

## 2024-02-13 LAB — TSH: TSH: 2.11 m[IU]/L

## 2024-02-13 LAB — HEPATIC FUNCTION PANEL
AG Ratio: 1.9 (calc) (ref 1.0–2.5)
ALT: 60 U/L — ABNORMAL HIGH (ref 6–29)
AST: 45 U/L — ABNORMAL HIGH (ref 10–30)
Albumin: 4.5 g/dL (ref 3.6–5.1)
Alkaline phosphatase (APISO): 57 U/L (ref 31–125)
Bilirubin, Direct: 0.2 mg/dL (ref 0.0–0.2)
Globulin: 2.4 g/dL (ref 1.9–3.7)
Indirect Bilirubin: 1.2 mg/dL (ref 0.2–1.2)
Total Bilirubin: 1.4 mg/dL — ABNORMAL HIGH (ref 0.2–1.2)
Total Protein: 6.9 g/dL (ref 6.1–8.1)

## 2024-02-13 LAB — FSH/LH
FSH: 7.9 m[IU]/mL
LH: 6.8 m[IU]/mL

## 2024-02-13 LAB — PROLACTIN: Prolactin: 7.3 ng/mL
